# Patient Record
Sex: Male | Born: 1960
Health system: Southern US, Community
[De-identification: ages and names within clinical notes are randomized; demographics above are authoritative.]

## PROBLEM LIST (undated history)

## (undated) DIAGNOSIS — F1910 Other psychoactive substance abuse, uncomplicated: Secondary | ICD-10-CM

## (undated) DIAGNOSIS — G40909 Epilepsy, unspecified, not intractable, without status epilepticus: Secondary | ICD-10-CM

## (undated) DIAGNOSIS — F32A Depression, unspecified: Secondary | ICD-10-CM

## (undated) DIAGNOSIS — IMO0002 Reserved for concepts with insufficient information to code with codable children: Secondary | ICD-10-CM

## (undated) DIAGNOSIS — Z5189 Encounter for other specified aftercare: Secondary | ICD-10-CM

## (undated) DIAGNOSIS — E164 Increased secretion of gastrin: Secondary | ICD-10-CM

## (undated) DIAGNOSIS — R569 Unspecified convulsions: Secondary | ICD-10-CM

## (undated) DIAGNOSIS — Z0289 Encounter for other administrative examinations: Secondary | ICD-10-CM

## (undated) DIAGNOSIS — J449 Chronic obstructive pulmonary disease, unspecified: Secondary | ICD-10-CM

## (undated) DIAGNOSIS — M1990 Unspecified osteoarthritis, unspecified site: Secondary | ICD-10-CM

## (undated) DIAGNOSIS — G8929 Other chronic pain: Secondary | ICD-10-CM

## (undated) DIAGNOSIS — G43909 Migraine, unspecified, not intractable, without status migrainosus: Secondary | ICD-10-CM

## (undated) DIAGNOSIS — K861 Other chronic pancreatitis: Secondary | ICD-10-CM

## (undated) DIAGNOSIS — K219 Gastro-esophageal reflux disease without esophagitis: Secondary | ICD-10-CM

## (undated) DIAGNOSIS — Z87442 Personal history of urinary calculi: Secondary | ICD-10-CM

## (undated) DIAGNOSIS — M109 Gout, unspecified: Secondary | ICD-10-CM

## (undated) DIAGNOSIS — M542 Cervicalgia: Secondary | ICD-10-CM

## (undated) DIAGNOSIS — F329 Major depressive disorder, single episode, unspecified: Secondary | ICD-10-CM

## (undated) DIAGNOSIS — K922 Gastrointestinal hemorrhage, unspecified: Secondary | ICD-10-CM

## (undated) HISTORY — DX: Gastrointestinal hemorrhage, unspecified: K92.2

## (undated) HISTORY — DX: Depression, unspecified: F32.A

## (undated) HISTORY — DX: Increased secretion of gastrin: E16.4

## (undated) HISTORY — DX: Reserved for concepts with insufficient information to code with codable children: IMO0002

## (undated) HISTORY — DX: Major depressive disorder, single episode, unspecified: F32.9

## (undated) HISTORY — PX: PARTIAL GASTRECTOMY: SHX2172

## (undated) HISTORY — DX: Other psychoactive substance abuse, uncomplicated: F19.10

## (undated) HISTORY — DX: Epilepsy, unspecified, not intractable, without status epilepticus: G40.909

## (undated) HISTORY — DX: Encounter for other specified aftercare: Z51.89

## (undated) HISTORY — DX: Other chronic pancreatitis: K86.1

## (undated) HISTORY — DX: Gout, unspecified: M10.9

## (undated) HISTORY — DX: Unspecified convulsions: R56.9

## (undated) HISTORY — PX: EYE SURGERY: SHX253

## (undated) HISTORY — PX: APPENDECTOMY: SHX54

## (undated) HISTORY — DX: Chronic obstructive pulmonary disease, unspecified: J44.9

## (undated) HISTORY — PX: HERNIA REPAIR: SHX51

## (undated) HISTORY — PX: CHOLECYSTECTOMY: SHX55

## (undated) HISTORY — PX: SMALL INTESTINE SURGERY: SHX150

---

## 1999-06-15 ENCOUNTER — Emergency Department (HOSPITAL_COMMUNITY): Admission: EM | Admit: 1999-06-15 | Discharge: 1999-06-15 | Payer: Self-pay | Admitting: Emergency Medicine

## 1999-06-16 ENCOUNTER — Inpatient Hospital Stay (HOSPITAL_COMMUNITY): Admission: AD | Admit: 1999-06-16 | Discharge: 1999-06-17 | Payer: Self-pay | Admitting: Psychiatry

## 1999-06-20 ENCOUNTER — Encounter: Payer: Self-pay | Admitting: Emergency Medicine

## 1999-06-20 ENCOUNTER — Emergency Department (HOSPITAL_COMMUNITY): Admission: EM | Admit: 1999-06-20 | Discharge: 1999-06-21 | Payer: Self-pay | Admitting: Emergency Medicine

## 2001-03-16 ENCOUNTER — Encounter: Payer: Self-pay | Admitting: Emergency Medicine

## 2001-03-17 ENCOUNTER — Inpatient Hospital Stay (HOSPITAL_COMMUNITY): Admission: EM | Admit: 2001-03-17 | Discharge: 2001-03-19 | Payer: Self-pay | Admitting: Emergency Medicine

## 2001-03-25 ENCOUNTER — Encounter: Admission: RE | Admit: 2001-03-25 | Discharge: 2001-03-25 | Payer: Self-pay | Admitting: Family Medicine

## 2001-09-02 ENCOUNTER — Emergency Department (HOSPITAL_COMMUNITY): Admission: EM | Admit: 2001-09-02 | Discharge: 2001-09-03 | Payer: Self-pay | Admitting: *Deleted

## 2001-09-03 ENCOUNTER — Encounter: Payer: Self-pay | Admitting: Emergency Medicine

## 2002-03-13 ENCOUNTER — Emergency Department (HOSPITAL_COMMUNITY): Admission: EM | Admit: 2002-03-13 | Discharge: 2002-03-13 | Payer: Self-pay

## 2004-09-14 ENCOUNTER — Emergency Department (HOSPITAL_COMMUNITY): Admission: EM | Admit: 2004-09-14 | Discharge: 2004-09-14 | Payer: Self-pay | Admitting: Emergency Medicine

## 2007-04-15 ENCOUNTER — Emergency Department (HOSPITAL_COMMUNITY): Admission: EM | Admit: 2007-04-15 | Discharge: 2007-04-15 | Payer: Self-pay | Admitting: Emergency Medicine

## 2009-08-14 ENCOUNTER — Emergency Department (HOSPITAL_BASED_OUTPATIENT_CLINIC_OR_DEPARTMENT_OTHER): Admission: EM | Admit: 2009-08-14 | Discharge: 2009-08-14 | Payer: Self-pay | Admitting: Emergency Medicine

## 2009-08-14 ENCOUNTER — Ambulatory Visit: Payer: Self-pay | Admitting: Diagnostic Radiology

## 2009-08-29 ENCOUNTER — Ambulatory Visit: Payer: Self-pay | Admitting: Diagnostic Radiology

## 2009-08-29 ENCOUNTER — Ambulatory Visit (HOSPITAL_BASED_OUTPATIENT_CLINIC_OR_DEPARTMENT_OTHER): Admission: RE | Admit: 2009-08-29 | Discharge: 2009-08-29 | Payer: Self-pay | Admitting: Emergency Medicine

## 2010-09-21 ENCOUNTER — Encounter: Admission: RE | Admit: 2010-09-21 | Discharge: 2010-09-21 | Payer: Self-pay | Admitting: Emergency Medicine

## 2010-12-22 ENCOUNTER — Encounter: Payer: Self-pay | Admitting: Emergency Medicine

## 2010-12-24 ENCOUNTER — Encounter: Payer: Self-pay | Admitting: Emergency Medicine

## 2011-03-08 LAB — URINE MICROSCOPIC-ADD ON

## 2011-03-08 LAB — URINALYSIS, ROUTINE W REFLEX MICROSCOPIC
Leukocytes, UA: NEGATIVE
Nitrite: NEGATIVE
Specific Gravity, Urine: 1.03 (ref 1.005–1.030)
pH: 6 (ref 5.0–8.0)

## 2011-03-08 LAB — POCT CARDIAC MARKERS
CKMB, poc: <1 ng/mL — ABNORMAL LOW (ref 1.0–8.0)
Myoglobin, poc: 72.7 ng/mL (ref 12–200)
Myoglobin, poc: 79.8 ng/mL (ref 12–200)
Troponin i, poc: <0.05 ng/mL (ref 0.00–0.09)

## 2011-03-08 LAB — BASIC METABOLIC PANEL
BUN: 6 mg/dL (ref 6–23)
CO2: 29 mEq/L (ref 19–32)
Chloride: 101 mEq/L (ref 96–112)
Glucose, Bld: 132 mg/dL — ABNORMAL HIGH (ref 70–99)
Potassium: 2.9 mEq/L — ABNORMAL LOW (ref 3.5–5.1)

## 2011-03-08 LAB — CBC
HCT: 33 % — ABNORMAL LOW (ref 39.0–52.0)
MCV: 93.2 fL (ref 78.0–100.0)
Platelets: 172 10*3/uL (ref 150–400)
RDW: 12.2 % (ref 11.5–15.5)

## 2011-03-08 LAB — DIFFERENTIAL
Basophils Absolute: 0.1 10*3/uL (ref 0.0–0.1)
Eosinophils Relative: 2 % (ref 0–5)
Lymphocytes Relative: 19 % (ref 12–46)
Monocytes Absolute: 1.6 10*3/uL — ABNORMAL HIGH (ref 0.1–1.0)

## 2011-03-08 LAB — APTT: aPTT: 31 seconds (ref 24–37)

## 2011-04-19 NOTE — Discharge Summary (Signed)
[place]  Patient:    Jerry Knight, Jerry Knight                       MRN: [id number] Adm. Date:  [id number] Disc. Date: [id number] Attending:  Willow Ora Dictator:   Solon Palm, M.D. CC:         Tom _______, Rochel Brome. [place]  Dr. Jennye Moccasin, [place], [place], GI physician   Discharge Summary  PROBLEM LIST: 1. Acute pancreatitis. 2. Chronic pancreatitis. 3. Migraine headaches. 4. Peptic ulcer disease. 5. Seizure disorder. 6. Degenerative disc disease. 7. History of gastrointestinal bleed.  BRIEF HISTORY OF PRESENT ILLNESS:  The patient is a 50 year old white male who presented on March 17, 2001, with one week of increasing mid epigastric pain consistent with flare of his pancreatitis.  Please see dictated H&P for details.  Though the patient had normal amylase and lipase, he states he has a long history of recurrent pancreatitis normally treated at the [place] in [place].  Initial physical examination very consistent with pancreatitis flare.  The patient was kept npo for the first 36 hours, slowly advanced diet. The patient tolerated advanced diet and was treated with IV Demerol and Phenergan.  Of note, he did show reddening in his skin on administration of IV Phenergan. That was discontinued and changed to p.o. once the patient did tolerate p.o. and the patient tolerated the IV Demerol without complications. In the afternoon of March 19, 2001, the patient showed adequate pain control on Percocet which he states to control his pain. The patient tolerating diet well.  Felt he will be able to keep up adequately with his p.o. hydration.  DISCHARGE INSTRUCTIONS: 1. Medications:    a. Percocet 5/325, the patient to take two tablets p.o. q.4-6h. as needed.    b. Topamax one tablet p.o. q.d. 2. Activity:  As tolerated. 3. Diet:  Continue soft diet.  Hydration encouraged. 4. Special instructions:    a. Follow-up with your primary  gastroenterologist at the [facility] as soon       as able.    b. Follow-up platelet was scheduled for patient. He states he can keep       his own appointment. DD:  03/19/01 TD:  03/20/01 Job: [id number]

## 2011-04-19 NOTE — H&P (Signed)
[place]  Patient:    Jerry Knight, Jerry Knight                       MRN: [id number] Adm. Date:  [id number] Disc. Date: [id number] Attending:  Willow Ora Dictator:   Solon Palm, M.D. CC:         Alferd Patee, P.A., [facility], [place], [place]   History and Physical  CHIEF COMPLAINT:  Abdominal pain.  HISTORY OF PRESENT ILLNESS:  The patient is a 50 year old white male with history of chronic pancreatitis, who presents with one week of progressively increasing mid epigastric pain that radiated to back, relieved by sitting in upright position, also noted having increasing diarrhea.  No bleeding, no melena, no vomiting.  He feels weak, dehydrated and tired of pain.  The patient is in town visiting father-in-law, who is ill, and not able to go to primary physician in [place].  PAST MEDICAL HISTORY: 1. Chronic pancreatitis. 2. C5-C6 degenerative disk disease. 3. Migraine headaches. 4. Seizure disorder x 7 years. 5. Zollinger-Ellison disease. 6. GI bleed.  PAST SURGICAL HISTORY:  Multiple abdominal GI resections seeming secondary to GI bleeds in 1983, 1985, 1992, 1993.  In 2000, the patient had a left parotid gland removed.  MEDICATIONS: 1. Topamax ______ p.o. q.d. 2. Percocet 2-3 tabs t.i.d. for 10 years.  ALLERGIES:  No known drug allergies.  Sensitive to MORPHINE.  FAMILY HISTORY:  Mother is alive at 49 years old with angina.  Father is alive at 60 with kidney stones.  Brothers and sisters are in good health.  SOCIAL HISTORY:  The patient is retired from the [employer] and married for 15 years, currently separated from wife but in very good standing.  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  He does smoke two packs a day for 20 years.  REVIEW OF SYSTEMS:  Positive weakness, positive pain.  ABDOMEN:  Decreased appetite x 3 days.  No fever, no chills, no  chest pain, no shortness of breath, no cough, no dysuria, no frequency.  Positive "dumping syndrome" for a week.  Positive headache.  No vision changes.  No seizure activity.  PHYSICAL EXAMINATION:  VITAL SIGNS:  97, 106, 16, 98%, 99/67.  GENERAL:  Thin, wrinkled white man, appearing older than older than his age, presently smiling.  HEENT:  Normocephalic, atraumatic.  Sclerae nonicteric.  PERRLA.  EOMI.  Nares patent, edentulous.  Mucous membranes are dry.  NECK:  Healed scars on left neck.  CARDIAC:  No JVD, no bruits, PMI nondisplaced, tachycardia, regular, no murmurs.  PULMONARY:  ______ expiratory phase.  Good effort.  Lungs field clear to auscultation bilaterally.  ABDOMEN:  Soft.  Positive bowel sounds.  Healed surgical scars in midline. Thin.  No rebound, guarding, or rigidity.  Positive very tender in mid epigastrium area to palpation.  EXTREMITIES:  No clubbing, cyanosis, or edema.  LABORATORY DATA:  Specific gravity 1.034, amylase 67, lipase 25.  WBC 16.6, hemoglobin 14.8, hematocrit 43.3, platelets 263.  RDW 12.6.  Chest x-ray no acute disease.  Abdominal x-ray no acute disease.  Sodium 134, potassium 3.9, chloride 103, CO2 23, BUN 17, creatinine .7, glucose 115, calcium 9.7, AST 16, ALT 12, alkaline phosphatase 91, bilirubin .9.  ASSESSMENT AND PLAN:  A 50 year old white male with increasing abdominal pain. Abdominal pain.  Surgical diagnoses includes pancreatitis, pseudocyst, obstruction, Zollinger-ellison disease, will treat  as pancreatitis for now, as the patient has a long history.  Chart review will be key.  Patient essentially has an APACHE score of 0.  Will hold ______ .  No vomiting or NG tube at this time.  Will keep patient n.p.o. and IV hydrate.  Will treat with Demerol IV.  Will discuss with the patient and our attending whether to pursue ongoing diagnosis with scans and surgery referrals or whether to treat this acute flare symptomatically so  patient may follow in [place] with his primary, as he seems to prefer. DD:  03/19/01 TD:  03/20/01 Job: [id number]

## 2011-06-21 ENCOUNTER — Ambulatory Visit
Admission: RE | Admit: 2011-06-21 | Discharge: 2011-06-21 | Disposition: A | Discharge status: Home / Self Care | Payer: Medicare Other | Source: Ambulatory Visit | Attending: Emergency Medicine | Admitting: Emergency Medicine

## 2011-06-21 ENCOUNTER — Other Ambulatory Visit: Payer: Self-pay | Admitting: Emergency Medicine

## 2011-06-21 MED ORDER — IOHEXOL 300 MG/ML  SOLN
100.0000 mL | Freq: Once | INTRAMUSCULAR | Status: AC | PRN
  Administered 2011-06-21: 100 mL via INTRAVENOUS

## 2011-06-25 ENCOUNTER — Other Ambulatory Visit: Payer: Self-pay | Admitting: Emergency Medicine

## 2011-06-25 DIAGNOSIS — R918 Other nonspecific abnormal finding of lung field: Secondary | ICD-10-CM

## 2011-07-01 ENCOUNTER — Other Ambulatory Visit: Payer: Medicare Other

## 2011-07-24 ENCOUNTER — Other Ambulatory Visit (HOSPITAL_COMMUNITY): Payer: Self-pay | Admitting: Urology

## 2011-07-24 DIAGNOSIS — N281 Cyst of kidney, acquired: Secondary | ICD-10-CM

## 2011-07-29 ENCOUNTER — Ambulatory Visit (HOSPITAL_COMMUNITY)
Admission: RE | Admit: 2011-07-29 | Discharge: 2011-07-29 | Disposition: A | Discharge status: Home / Self Care | Payer: Medicare Other | Source: Ambulatory Visit | Attending: Urology | Admitting: Urology

## 2011-07-29 ENCOUNTER — Other Ambulatory Visit: Payer: Self-pay | Admitting: Interventional Radiology

## 2011-07-29 DIAGNOSIS — N281 Cyst of kidney, acquired: Secondary | ICD-10-CM

## 2011-07-29 DIAGNOSIS — K859 Acute pancreatitis without necrosis or infection, unspecified: Secondary | ICD-10-CM | POA: Insufficient documentation

## 2011-07-29 DIAGNOSIS — K863 Pseudocyst of pancreas: Secondary | ICD-10-CM | POA: Insufficient documentation

## 2011-07-29 DIAGNOSIS — Z01812 Encounter for preprocedural laboratory examination: Secondary | ICD-10-CM | POA: Insufficient documentation

## 2011-07-29 DIAGNOSIS — K862 Cyst of pancreas: Secondary | ICD-10-CM | POA: Insufficient documentation

## 2011-07-29 LAB — BASIC METABOLIC PANEL
BUN: 12 mg/dL (ref 6–23)
CO2: 33 mEq/L — ABNORMAL HIGH (ref 19–32)
Calcium: 10.1 mg/dL (ref 8.4–10.5)
Chloride: 96 mEq/L (ref 96–112)
Creatinine, Ser: 0.62 mg/dL (ref 0.50–1.35)
GFR calc Af Amer: >60 mL/min (ref >60)
GFR calc non Af Amer: >60 mL/min (ref >60)
Glucose, Bld: 89 mg/dL (ref 70–99)
Potassium: 4.4 mEq/L (ref 3.5–5.1)
Sodium: 135 mEq/L (ref 135–145)

## 2011-07-29 LAB — CBC
HCT: 40 % (ref 39.0–52.0)
Hemoglobin: 13.8 g/dL (ref 13.0–17.0)
MCH: 31.2 pg (ref 26.0–34.0)
MCHC: 34.5 g/dL (ref 30.0–36.0)
MCV: 90.3 fL (ref 78.0–100.0)
Platelets: 390 10*3/uL (ref 150–400)
RBC: 4.43 MIL/uL (ref 4.22–5.81)
RDW: 13.8 % (ref 11.5–15.5)
WBC: 9.3 10*3/uL (ref 4.0–10.5)

## 2011-07-29 LAB — PROTIME-INR
INR: 0.92 (ref 0.00–1.49)
Prothrombin Time: 12.6 seconds (ref 11.6–15.2)

## 2011-07-29 LAB — APTT: aPTT: 29 seconds (ref 24–37)

## 2011-07-31 ENCOUNTER — Telehealth: Payer: Self-pay | Admitting: Internal Medicine

## 2011-07-31 NOTE — Telephone Encounter (Signed)
Left message for pt to call back  °

## 2011-08-01 NOTE — Telephone Encounter (Signed)
Left message for pt to call back  °

## 2011-08-06 NOTE — Telephone Encounter (Signed)
Left message for pt to call back.   After multiple attempts of trying to get in touch with the pt. I have been unable to do so. Pt will not return the phone messages left for him. Called [facility] and let them know we have been unable to schedule the pt.

## 2011-08-30 ENCOUNTER — Ambulatory Visit (HOSPITAL_BASED_OUTPATIENT_CLINIC_OR_DEPARTMENT_OTHER)
Admission: RE | Admit: 2011-08-30 | Discharge: 2011-08-30 | Disposition: A | Discharge status: Home / Self Care | Payer: Medicare Other | Source: Ambulatory Visit | Attending: Orthopedic Surgery | Admitting: Orthopedic Surgery

## 2011-08-30 DIAGNOSIS — M869 Osteomyelitis, unspecified: Secondary | ICD-10-CM | POA: Insufficient documentation

## 2011-08-30 DIAGNOSIS — J449 Chronic obstructive pulmonary disease, unspecified: Secondary | ICD-10-CM | POA: Insufficient documentation

## 2011-08-30 DIAGNOSIS — IMO0002 Reserved for concepts with insufficient information to code with codable children: Secondary | ICD-10-CM | POA: Insufficient documentation

## 2011-08-30 DIAGNOSIS — J4489 Other specified chronic obstructive pulmonary disease: Secondary | ICD-10-CM | POA: Insufficient documentation

## 2011-09-02 LAB — WOUND CULTURE

## 2011-09-03 LAB — TISSUE CULTURE

## 2011-09-03 NOTE — Op Note (Signed)
  NAME:  Jerry Knight, Jerry Knight                ACCOUNT NO.:  [id number]  MEDICAL RECORD NO.:  [id number]  LOCATION:                                 FACILITY:  PHYSICIAN:  Cindee Salt, M.D.       DATE OF BIRTH:  1961/09/10  DATE OF PROCEDURE:  08/30/2011 DATE OF DISCHARGE:                              OPERATIVE REPORT   PREOPERATIVE DIAGNOSIS:  Osteomyelitis felon, left thumb.  POSTOPERATIVE DIAGNOSIS:  Osteomyelitis felon, left thumb.  OPERATION:  Incision and drainage, osteomyelitis felon, left thumb, distal phalanx.  SURGEON:  Cindee Salt, MD  ANESTHESIA:  General.  ANESTHESIOLOGIST:  Bedelia Person, MD  HISTORY:  The patient is a 50 year old male with a history of swelling pain of his thumb.  He was recently placed on Cipro, attempted to drain this himself without success.  X-rays reveal loculation of the distal phalanx indicative of an osteomyelitis.  He is admitted now for incision and drainage.  Pre, peri, and postoperative course have been discussed along with risks and complications.  PROCEDURE:  The patient was brought to the operating room where a general anesthetic was carried out without difficulty.  He was prepped using ChloraPrep, supine position, left arm free.  A 3-minute dry time was allowed.  Time-out taken confirming the patient and procedure. After adequate anesthesia was afforded, the limb was elevated for exsanguination.  Tourniquet was placed on the forearm, it was inflated to 250 mmHg.  The nail plate was removed.  No gross purulence was noted beneath the nail plate and incision was made on the ulnar aspect of the distal phalanx, carried down through the subcutaneous tissue.  All material was immediately encountered.  This was opened and followed down to the distal phalanx, which had a large erosion, this was curetted clear.  Cultures were taken for both aerobic and anaerobic fungal and TB cultures along with MRSA.  Specimen was sent along with a biopsy from the  bone.  The wound was then copiously irrigated with saline.  The wound was packed open.  A sterile compressive dressing splint to the thumb was applied.  The patient tolerated the procedure well and was taken to the recovery room for observation in satisfactory condition.  He will be discharged to home to return on Monday or Tuesday, on Percocet and Cipro.          ______________________________ Cindee Salt, M.D.     GK/MEDQ  D:  08/30/2011  T:  08/30/2011  Job:  [id number]  cc:   Brett Canales A. Cleta Alberts, M.D.  Electronically Signed by Cindee Salt M.D. on 09/03/2011 04:41:00 PM

## 2011-09-04 LAB — ANAEROBIC CULTURE

## 2011-09-27 LAB — FUNGUS CULTURE W SMEAR
Fungal Smear: NONE SEEN
Fungal Smear: NONE SEEN

## 2011-10-01 ENCOUNTER — Encounter: Payer: Self-pay | Admitting: Gastroenterology

## 2011-10-14 LAB — AFB CULTURE WITH SMEAR (NOT AT ARMC)

## 2011-12-02 ENCOUNTER — Ambulatory Visit (INDEPENDENT_AMBULATORY_CARE_PROVIDER_SITE_OTHER): Payer: Medicare Other

## 2011-12-02 DIAGNOSIS — F411 Generalized anxiety disorder: Secondary | ICD-10-CM

## 2011-12-02 DIAGNOSIS — R071 Chest pain on breathing: Secondary | ICD-10-CM

## 2011-12-28 ENCOUNTER — Ambulatory Visit (INDEPENDENT_AMBULATORY_CARE_PROVIDER_SITE_OTHER): Payer: Medicare Other

## 2011-12-28 DIAGNOSIS — M545 Low back pain: Secondary | ICD-10-CM

## 2011-12-28 DIAGNOSIS — K863 Pseudocyst of pancreas: Secondary | ICD-10-CM

## 2012-01-10 ENCOUNTER — Ambulatory Visit: Payer: Medicare Other | Admitting: Internal Medicine

## 2012-01-23 ENCOUNTER — Encounter: Payer: Self-pay | Admitting: Internal Medicine

## 2012-01-23 ENCOUNTER — Telehealth: Payer: Self-pay

## 2012-01-23 ENCOUNTER — Ambulatory Visit (INDEPENDENT_AMBULATORY_CARE_PROVIDER_SITE_OTHER): Payer: Medicare Other | Admitting: Internal Medicine

## 2012-01-23 DIAGNOSIS — R0602 Shortness of breath: Secondary | ICD-10-CM

## 2012-01-23 DIAGNOSIS — R079 Chest pain, unspecified: Secondary | ICD-10-CM

## 2012-01-23 NOTE — Progress Notes (Signed)
HPI Patient has been under increased stress.  Brother died  Has seen Dr. Cleta Alberts in past  Felt to have panic attacks.  Multiple medical problesm (MRSA thumb, panreatitis, renal cyst)  Patient said he was getting sore in left chest.  Real SOB.  Would get sore with and without activity.  Begain about 4 to 5 months ago.  Occur at least 1x per wk.  Now seems to be a bit more frequent. Still with and without activity.  Pain and SOB may last as long as 30 min.  May be exacerbated by not feeling well. No PND. Saw S/ Daub 1 month ago.  Told him that he took 1 Xanax and it seemed to help.  Allergies  Allergen Reactions  . Sonata (Zaleplon)     Current Outpatient Prescriptions  Medication Sig Dispense Refill  . amitriptyline (ELAVIL) 50 MG tablet Take 100 mg by mouth at bedtime.      Marland Kitchen morphine (MS CONTIN) 30 MG 12 hr tablet Take 30 mg by mouth 2 (two) times daily.      Marland Kitchen oxyCODONE-acetaminophen (PERCOCET) 5-325 MG per tablet Take 1 tablet by mouth every 4 (four) hours as needed.        Past Medical History  Diagnosis Date  . Chest pain   . Chronic pancreatitis   . 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 Migraine   . Seizure disorder     x 7 years  . Zollinger-Ellison syndrome   . GI bleed     History reviewed. No pertinent past surgical history.  Family History  Problem Relation Age of Onset  . Angina Mother   . Nephrolithiasis Father       Review of Systems:  All systems reviewed.  They are negative to the above problem except as previously stated.  Vital Signs: BP 125/87  Pulse 105  Ht 5\' 5"  (1.651 m)  Wt 108 lb (48.988 kg)  BMI 17.97 kg/m2  Physical Exam Patient a thin 51 year old in NAD HEENT:  Normocephalic, atraumatic. EOMI, PERRLA.  Neck: JVP is normal. No thyromegaly. No bruits.  Lungs: clear to auscultation. No rales no wheezes.  Heart: Regular rate and rhythm. Normal S1, S2. No S3.   No significant murmurs. PMI not displaced.  Abdomen:  Supple, nontender.  Normal bowel sounds. No masses. No hepatomegaly.  Extremities:   Good distal pulses throughout. No lower extremity edema.  Musculoskeletal :moving all extremities.  Neuro:   alert and oriented x3.  CN II-XII grossly intact.  EKG:  ST  105 bpm.   Assessment and Plan:

## 2012-01-23 NOTE — Telephone Encounter (Signed)
Records sent to [facility].

## 2012-01-23 NOTE — Telephone Encounter (Signed)
.  umfc Dr. Tenny Craw at [facility] is requesting labs/ekg/progress note for patient who is in office now. Fax number : [phone]

## 2012-01-26 DIAGNOSIS — R079 Chest pain, unspecified: Secondary | ICD-10-CM | POA: Insufficient documentation

## 2012-01-26 DIAGNOSIS — R0602 Shortness of breath: Secondary | ICD-10-CM | POA: Insufficient documentation

## 2012-01-26 NOTE — Assessment & Plan Note (Signed)
Would like to review records.

## 2012-01-26 NOTE — Assessment & Plan Note (Signed)
Symptoms concerning but atypical.  I did not receive records from Dr. Ellis Parents office. I would like to review before scheduling for any tests.   Patient should continue acitvities as tolerated.

## 2012-01-29 ENCOUNTER — Telehealth: Payer: Self-pay | Admitting: Internal Medicine

## 2012-01-29 NOTE — Telephone Encounter (Signed)
Reviewed records from [facility]. Set up for regular stress test.

## 2012-01-29 NOTE — Telephone Encounter (Signed)
LMOM for call back to see if he would be be willing to set up a GXT per Dr.Ross' request.

## 2012-01-30 ENCOUNTER — Ambulatory Visit (INDEPENDENT_AMBULATORY_CARE_PROVIDER_SITE_OTHER): Payer: Medicare Other | Admitting: Emergency Medicine

## 2012-01-30 ENCOUNTER — Other Ambulatory Visit: Payer: Self-pay | Admitting: Emergency Medicine

## 2012-01-30 DIAGNOSIS — K859 Acute pancreatitis without necrosis or infection, unspecified: Secondary | ICD-10-CM

## 2012-01-30 DIAGNOSIS — M549 Dorsalgia, unspecified: Secondary | ICD-10-CM

## 2012-01-30 MED ORDER — MORPHINE SULFATE CR 30 MG PO TB12: 30.0000 mg | ORAL_TABLET | Freq: Two times a day (BID) | ORAL | Status: DC

## 2012-01-30 MED ORDER — OXYCODONE-ACETAMINOPHEN 10-325 MG PO TABS: 1.0000 | ORAL_TABLET | Freq: Four times a day (QID) | ORAL | Status: DC | PRN

## 2012-01-30 NOTE — Progress Notes (Signed)
  Subjective:    Patient ID: Jerry Knight, male    DOB: 1960-12-30, 51 y.o.   MRN: [id number]  HPI patient enters for refill of his prescriptions. He states he is unable to weight. He would like a prescription for one week. Until he can get in to see me.    Review of Systems     Objective:   Physical Exam no change in exam.        Assessment & Plan:  The patient is on chronic pain medications related to her chronic pancreatitis. He is been on medications for a number of years. I agreed to refill his medications for one week. I told him none of the other providers could refill his medications.

## 2012-02-02 ENCOUNTER — Encounter: Payer: Medicare Other | Admitting: Physician Assistant

## 2012-02-02 NOTE — Progress Notes (Signed)
This encounter was created in error - please disregard.

## 2012-02-05 ENCOUNTER — Ambulatory Visit (INDEPENDENT_AMBULATORY_CARE_PROVIDER_SITE_OTHER): Payer: Medicare Other | Admitting: Emergency Medicine

## 2012-02-05 DIAGNOSIS — K859 Acute pancreatitis without necrosis or infection, unspecified: Secondary | ICD-10-CM

## 2012-02-05 DIAGNOSIS — R634 Abnormal weight loss: Secondary | ICD-10-CM

## 2012-02-05 DIAGNOSIS — M549 Dorsalgia, unspecified: Secondary | ICD-10-CM

## 2012-02-05 DIAGNOSIS — R1084 Generalized abdominal pain: Secondary | ICD-10-CM

## 2012-02-05 MED ORDER — MORPHINE SULFATE CR 30 MG PO TB12: 30.0000 mg | ORAL_TABLET | Freq: Two times a day (BID) | ORAL | Status: DC

## 2012-02-05 MED ORDER — OXYCODONE-ACETAMINOPHEN 10-325 MG PO TABS: 1.0000 | ORAL_TABLET | Freq: Four times a day (QID) | ORAL | Status: AC | PRN

## 2012-02-05 NOTE — Progress Notes (Signed)
  Subjective:    Patient ID: Jerry Knight, male    DOB: 1961/02/22, 51 y.o.   MRN: [id number]  HPI patient enters for refill of his pain medications. He is scheduled for sinus surgery April 1. He continues to try and increase his by mouth intake. He states he is taking his pancreatic enzymes.    Review of Systems he denies any other symptoms except he is scheduled for sinus surgery.     Objective:   Physical Exam does have nasal congestion. Nose has crusted inflamed chest is clear heart regular rate no murmurs his abdomen is flat there is no tenderness.        Assessment & Plan:  Assessment is chronic abdominal pain related to chronic pancreatitis. He continues to battle with weight. He is scheduled for sinus surgery for his sinusitis. Patient was given one-month prescriptions for his medications. He will return in one month for recheck.

## 2012-02-24 ENCOUNTER — Other Ambulatory Visit: Payer: Self-pay | Admitting: Family Medicine

## 2012-02-27 NOTE — Telephone Encounter (Signed)
LMOM to call back to set up GXT.

## 2012-03-04 ENCOUNTER — Ambulatory Visit (INDEPENDENT_AMBULATORY_CARE_PROVIDER_SITE_OTHER): Payer: Medicare Other | Admitting: Emergency Medicine

## 2012-03-04 VITALS — BP 97/59 | HR 101 | Temp 97.7°F | Resp 16 | Ht 66.0 in | Wt 108.6 lb

## 2012-03-04 DIAGNOSIS — J019 Acute sinusitis, unspecified: Secondary | ICD-10-CM

## 2012-03-04 DIAGNOSIS — M549 Dorsalgia, unspecified: Secondary | ICD-10-CM

## 2012-03-04 DIAGNOSIS — K859 Acute pancreatitis without necrosis or infection, unspecified: Secondary | ICD-10-CM

## 2012-03-04 DIAGNOSIS — K861 Other chronic pancreatitis: Secondary | ICD-10-CM

## 2012-03-04 MED ORDER — OXYCODONE-ACETAMINOPHEN 10-325 MG PO TABS: 1.0000 | ORAL_TABLET | Freq: Four times a day (QID) | ORAL | Status: DC | PRN

## 2012-03-04 MED ORDER — MORPHINE SULFATE CR 30 MG PO TB12: 30.0000 mg | ORAL_TABLET | Freq: Two times a day (BID) | ORAL | Status: DC

## 2012-03-04 NOTE — Progress Notes (Signed)
  Subjective:    Patient ID: Jerry Knight, male    DOB: Apr 13, 1961, 51 y.o.   MRN: [id number]  HPI patient here for followup of pain management. His chronic pancreatitis. He is on chronic pain medication and has been for years. He is scheduled for sinus surgery tomorrow with Dr. Gray Bernhardt at [facility].    Review of Systems     Objective:   Physical Exam patient is alert cooperative in no distress. His chest is clear heart regular rate. He as no tenderness in the midepigastrium        Assessment & Plan:  ere for refill of his medications. Refills were given for one month. He was told he is not allowed to get any prescriptions for pain from Dr. Baldomero Lamy.

## 2012-03-05 NOTE — Telephone Encounter (Signed)
Left message to call back to schedule GXT.

## 2012-03-06 ENCOUNTER — Telehealth: Payer: Self-pay

## 2012-03-06 NOTE — Telephone Encounter (Signed)
.  umfc The patient called to inform Dr. Cleta Alberts that the MD who did his sinus surgery on 03/05/12 wrote him a Rx for Percocet to take in addition to the Percocet and Morphine that the patient already takes.  Patient did not want to fill Rx and get in trouble or violate his pain management contract.  Patient questions should he fill Rx or not? Please call patient at [phone].

## 2012-03-07 NOTE — Telephone Encounter (Signed)
LMOM advising patient not to fill new rx.  If he does he would violate his contract.  CB if further questions, and note to MD, FYI.

## 2012-03-18 ENCOUNTER — Emergency Department (HOSPITAL_COMMUNITY): Payer: Medicare Other

## 2012-03-18 ENCOUNTER — Emergency Department (HOSPITAL_COMMUNITY)
Admission: EM | Admit: 2012-03-18 | Discharge: 2012-03-18 | Disposition: A | Discharge status: Home / Self Care | Payer: Medicare Other | Attending: Emergency Medicine | Admitting: Emergency Medicine

## 2012-03-18 ENCOUNTER — Encounter (HOSPITAL_COMMUNITY): Payer: Self-pay | Admitting: Emergency Medicine

## 2012-03-18 DIAGNOSIS — K861 Other chronic pancreatitis: Secondary | ICD-10-CM | POA: Insufficient documentation

## 2012-03-18 DIAGNOSIS — R197 Diarrhea, unspecified: Secondary | ICD-10-CM | POA: Insufficient documentation

## 2012-03-18 DIAGNOSIS — R112 Nausea with vomiting, unspecified: Secondary | ICD-10-CM

## 2012-03-18 DIAGNOSIS — R109 Unspecified abdominal pain: Secondary | ICD-10-CM | POA: Insufficient documentation

## 2012-03-18 DIAGNOSIS — M5030 Other cervical disc degeneration, unspecified cervical region: Secondary | ICD-10-CM | POA: Insufficient documentation

## 2012-03-18 HISTORY — DX: Other chronic pain: G89.29

## 2012-03-18 HISTORY — DX: Encounter for other administrative examinations: Z02.89

## 2012-03-18 HISTORY — DX: Cervicalgia: M54.2

## 2012-03-18 HISTORY — DX: Migraine, unspecified, not intractable, without status migrainosus: G43.909

## 2012-03-18 LAB — COMPREHENSIVE METABOLIC PANEL
Albumin: 3.6 g/dL (ref 3.5–5.2)
BUN: 9 mg/dL (ref 6–23)
Calcium: 9.6 mg/dL (ref 8.4–10.5)
Creatinine, Ser: 0.58 mg/dL (ref 0.50–1.35)
Total Bilirubin: 0.3 mg/dL (ref 0.3–1.2)
Total Protein: 7.8 g/dL (ref 6.0–8.3)

## 2012-03-18 LAB — CBC
HCT: 38 % — ABNORMAL LOW (ref 39.0–52.0)
Hemoglobin: 12.9 g/dL — ABNORMAL LOW (ref 13.0–17.0)
MCH: 29.6 pg (ref 26.0–34.0)
MCHC: 33.9 g/dL (ref 30.0–36.0)
RDW: 13.7 % (ref 11.5–15.5)

## 2012-03-18 LAB — DIFFERENTIAL
Basophils Absolute: 0 10*3/uL (ref 0.0–0.1)
Basophils Relative: 0 % (ref 0–1)
Eosinophils Absolute: 0 10*3/uL (ref 0.0–0.7)
Monocytes Absolute: 0.7 10*3/uL (ref 0.1–1.0)
Monocytes Relative: 7 % (ref 3–12)

## 2012-03-18 LAB — LIPASE, BLOOD: Lipase: 48 U/L (ref 11–59)

## 2012-03-18 MED ORDER — SODIUM CHLORIDE 0.9 % IV BOLUS (SEPSIS)
500.0000 mL | Freq: Once | INTRAVENOUS | Status: AC
  Administered 2012-03-18: 500 mL via INTRAVENOUS

## 2012-03-18 MED ORDER — ONDANSETRON HCL 4 MG/2ML IJ SOLN
4.0000 mg | INTRAMUSCULAR | Status: DC | PRN
  Administered 2012-03-18: 4 mg via INTRAVENOUS
  Filled 2012-03-18: qty 2

## 2012-03-18 MED ORDER — HYDROMORPHONE HCL PF 1 MG/ML IJ SOLN
1.0000 mg | Freq: Once | INTRAMUSCULAR | Status: AC
  Administered 2012-03-18: 1 mg via INTRAVENOUS
  Filled 2012-03-18: qty 1

## 2012-03-18 MED ORDER — HYDROMORPHONE HCL PF 1 MG/ML IJ SOLN
1.0000 mg | INTRAMUSCULAR | Status: AC | PRN
  Administered 2012-03-18 (×2): 1 mg via INTRAVENOUS
  Filled 2012-03-18 (×2): qty 1

## 2012-03-18 MED ORDER — SODIUM CHLORIDE 0.9 % IV SOLN
INTRAVENOUS | Status: DC
  Administered 2012-03-18: 13:00:00 via INTRAVENOUS

## 2012-03-18 MED ORDER — ONDANSETRON 4 MG PO TBDP: 4.0000 mg | ORAL_TABLET | Freq: Three times a day (TID) | ORAL | Status: AC | PRN

## 2012-03-18 NOTE — ED Provider Notes (Signed)
History     CSN: [id number]  Arrival date & time 03/18/12  1201   First MD Initiated Contact with Patient 03/18/12 1214      Chief Complaint  Patient presents with  . Nausea  . Abdominal Pain    HPI Pt was seen at 1255.   Per pt, c/o gradual onset and persistence of multiple intermittent episodes of N/V/D since last night.  Pt describes his stool as "watery yellow."  Endorses he has not been taking his chronic pain meds due to N/V and is having an acute flair of his chronic "pains."   Pt also endorses that he "got mad and threw out some of my pain pills" several days ago.  Denies CP/SOB, no fevers, no black or blood in stools or emesis, no back pain, no abd pain.     Past Medical History  Diagnosis Date  . Chest pain   . Chronic pancreatitis   . 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 Migraine   . Seizure disorder     x 7 years  . Zollinger-Ellison syndrome   . GI bleed   . Chronic pain   . Migraine headache   . Chronic neck pain   . Pain management contract agreement     Past Surgical History  Procedure Date  . Partial gastrectomy     Family History  Problem Relation Age of Onset  . Angina Mother   . Nephrolithiasis Father     History  Substance Use Topics  . Smoking status: Current Everyday Smoker  . Smokeless tobacco: Not on file   Comment: about 2 packs a day  . Alcohol Use: No    Review of Systems ROS: Statement: All systems negative except as marked or noted in the HPI; Constitutional: Negative for fever and chills. ; ; Eyes: Negative for eye pain, redness and discharge. ; ; ENMT: Negative for ear pain, hoarseness, nasal congestion, sinus pressure and sore throat. ; ; Cardiovascular: Negative for chest pain, palpitations, diaphoresis, dyspnea and peripheral edema. ; ; Respiratory: Negative for cough, wheezing and stridor. ; ; Gastrointestinal: +N/V/D. Negative for abdominal pain, blood in stool, hematemesis, jaundice and rectal bleeding. . ; ;  Genitourinary: Negative for dysuria, flank pain and hematuria. ; ; Musculoskeletal: Negative for back pain and neck pain. Negative for swelling and trauma.; ; Skin: Negative for pruritus, rash, abrasions, blisters, bruising and skin lesion.; ; Neuro: Negative for headache, lightheadedness and neck stiffness. Negative for weakness, altered level of consciousness , altered mental status, extremity weakness, paresthesias, involuntary movement, seizure and syncope.      Allergies  Sonata  Home Medications   Current Outpatient Rx  Name Route Sig Dispense Refill  . AMITRIPTYLINE HCL 100 MG PO TABS  TAKE  ONE TABLET BY MOUTH NIGHTLY AT BEDTIME 30 tablet 2  . AMITRIPTYLINE HCL 50 MG PO TABS Oral Take 100 mg by mouth at bedtime.    . MORPHINE SULFATE ER 30 MG PO TB12 Oral Take 1 tablet (30 mg total) by mouth 2 (two) times daily. 60 tablet 0  . OXYCODONE-ACETAMINOPHEN 10-325 MG PO TABS Oral Take 1 tablet by mouth every 6 (six) hours as needed for pain. 120 tablet 0  . PRAVASTATIN SODIUM 40 MG PO TABS Oral Take 40 mg by mouth daily.      BP 115/54  Pulse 72  Temp 98.4 F (36.9 C)  Resp 22  SpO2 100%  Physical Exam 1250: Physical examination:  Nursing notes reviewed; Vital signs and O2 SAT reviewed;  Constitutional: Thin, discheveled, In no acute distress; Head:  Normocephalic, atraumatic; Eyes: EOMI, PERRL, No scleral icterus; ENMT: Mouth and pharynx normal, Mucous membranes dry; Neck: Supple, Full range of motion, No lymphadenopathy; Cardiovascular: Regular rate and rhythm, No murmur, rub, or gallop; Respiratory: Breath sounds clear & equal bilaterally, No rales, rhonchi, wheezes, or rub, Normal respiratory effort/excursion; Chest: Nontender, Movement normal; Abdomen: Soft, Nontender, Nondistended, Normal bowel sounds, +vomiting during exam.; Genitourinary: No CVA tenderness; Extremities: Pulses normal, No tenderness, No edema, No calf edema or asymmetry.; Neuro: AA&Ox3, Major CN grossly intact.  No  gross focal motor or sensory deficits in extremities.; Skin: Color normal, Warm, Dry, no rash.    ED Course  Procedures   MDM  MDM Reviewed: nursing note, vitals and previous chart Interpretation: labs and x-ray   Results for orders placed during the hospital encounter of 03/18/12  CBC      Component Value Range   WBC 9.1  4.0 - 10.5 (K/uL)   RBC 4.36  4.22 - 5.81 (MIL/uL)   Hemoglobin 12.9 (*) 13.0 - 17.0 (g/dL)   HCT 16.1 (*) 09.6 - 52.0 (%)   MCV 87.2  78.0 - 100.0 (fL)   MCH 29.6  26.0 - 34.0 (pg)   MCHC 33.9  30.0 - 36.0 (g/dL)   RDW 04.5  40.9 - 81.1 (%)   Platelets 486 (*) 150 - 400 (K/uL)  DIFFERENTIAL      Component Value Range   Neutrophils Relative 77  43 - 77 (%)   Neutro Abs 7.0  1.7 - 7.7 (K/uL)   Lymphocytes Relative 15  12 - 46 (%)   Lymphs Abs 1.4  0.7 - 4.0 (K/uL)   Monocytes Relative 7  3 - 12 (%)   Monocytes Absolute 0.7  0.1 - 1.0 (K/uL)   Eosinophils Relative 0  0 - 5 (%)   Eosinophils Absolute 0.0  0.0 - 0.7 (K/uL)   Basophils Relative 0  0 - 1 (%)   Basophils Absolute 0.0  0.0 - 0.1 (K/uL)  COMPREHENSIVE METABOLIC PANEL      Component Value Range   Sodium 135  135 - 145 (mEq/L)   Potassium 4.0  3.5 - 5.1 (mEq/L)   Chloride 99  96 - 112 (mEq/L)   CO2 26  19 - 32 (mEq/L)   Glucose, Bld 113 (*) 70 - 99 (mg/dL)   BUN 9  6 - 23 (mg/dL)   Creatinine, Ser 9.14  0.50 - 1.35 (mg/dL)   Calcium 9.6  8.4 - 78.2 (mg/dL)   Total Protein 7.8  6.0 - 8.3 (g/dL)   Albumin 3.6  3.5 - 5.2 (g/dL)   AST 17  0 - 37 (U/L)   ALT 9  0 - 53 (U/L)   Alkaline Phosphatase 96  39 - 117 (U/L)   Total Bilirubin 0.3  0.3 - 1.2 (mg/dL)   GFR calc non Af Amer >90  >90 (mL/min)   GFR calc Af Amer >90  >90 (mL/min)  LIPASE, BLOOD      Component Value Range   Lipase 48  11 - 59 (U/L)   Dg Abd Acute W/chest 03/18/2012  *RADIOLOGY REPORT*  Clinical Data: Abdominal pain with nausea and vomiting.  Evaluate for small bowel obstruction.  ACUTE ABDOMEN SERIES (ABDOMEN 2 VIEW &  CHEST 1 VIEW)  Comparison: Abdominal CT 06/21/2011.  Chest radiographs 08/14/2009.  Findings: The heart size and mediastinal contours are normal. The  lungs are clear. There is no pleural effusion or pneumothorax. No acute osseous findings are identified. There are stable prominent nipple shadows.  There are postsurgical changes throughout the abdomen with bowel anastomosis clips in the left abdomen and pelvis.  The abdomen is nearly gasless.  There are scattered air fluid levels.  No significantly distended loops of small or large bowel are seen. There is no free intraperitoneal air.  Left pelvic phleboliths appear unchanged.  IMPRESSION: Nearly gasless abdomen with scattered air fluid levels.  No evidence of bowel obstruction.  No acute cardiopulmonary process.  Original Report Authenticated By: Gerrianne Scale, M.D.      1715:  Pt has tol PO well while in the ED without N/V.  No stooling while in the ED.  VS remain stable, abd benign, resps easy.  Pt made aware that I will not be rx him more narcotic pain meds today just because he endorses he "threw them out because I was mad," as he has a pain contract and needs to contact his PMD.  Verb understanding and requesting "another dose of pain meds here before I leave then."  Dx testing d/w pt.  Questions answered.  Verb understanding, agreeable to d/c home with outpt f/u.          Laray Anger, DO 03/20/12 2157

## 2012-03-18 NOTE — ED Notes (Signed)
Pt states abd pain and emesis since early am, emesis x 8-10 since last night. Pt reports last BM 5am which was yellow liquid.

## 2012-03-18 NOTE — ED Notes (Signed)
JWJ:[id number]<NW> Expected date:03/18/12<BR> Expected time:<BR> Means of arrival:<BR> Comments:<BR> EMS 41 gC - n/v/cheswall pain

## 2012-03-18 NOTE — Discharge Instructions (Signed)
RESOURCE GUIDE  Dental Problems  Patients with Medicaid: [facility]                     [facility] [address]                                           [address] Phone:  [phone]                                                  Phone:  [phone]  If unable to pay or uninsured, contact:  [facility] or [facility] to become qualified for the adult dental clinic.  Chronic Pain Problems Contact [facility]  [phone] Patients need to be referred by their primary care doctor.  Insufficient Money for Medicine Contact [employer] Way:  call "211" or [facility] 271-5999.  No Primary Care Doctor Call [facility]  832-8000 Other agencies that provide inexpensive medical care    [facility]  832-8035    [facility]  832-7272    [facility]  [phone]    [facility] Clinic  832-4777    [facility]  [phone]    [facility]  [phone]  Psychological Services [facility]  [phone] [facility]  [phone] [facility]   800 853-5163 (emergency services 641-4993)  Substance Abuse Resources [facility]  336-882-2125 [facility] 336-784-9470 [facility] 336-285-9073 [facility] 336-845-3988 Residential & Outpatient Substance Abuse Program  800-659-3381  Abuse/Neglect [place] Child Abuse Hotline (336) 641-3795 [place] Child Abuse Hotline 800-378-5315 (After Hours)  Emergency Shelter [facility] (336) 271-5985  M[facility] (336) 275-9566 [facility] (704) 372-4663  MRSA Hotline #:   832-7006    [place] [facility]  [facility]     [employer]                          [facility] [address] [place]                       [address]      [address]  [place]                                                [place]                            [place] Phone:  [phone]                                   Phone:  [phone]                 Phone:  [phone]  [facility] Phone:  342-8316    [facility] Child Abuse Hotline 517-044-7032 360-353-3470 (After Hours)    Take the prescription as directed.  Increase your fluid intake (ie:  Gatoraide) for the next few days.  Eat a bland diet and advance to your regular diet slowly as you can tolerate it.   Avoid full strength juices, as well as milk and milk products until your diarrhea has resolved.   Call your regular medical doctor tomorrow to schedule a follow up appointment within the next 2 days.  Return to the Emergency Department immediately if not improving (or even worsening) despite taking the medicines as prescribed, any black or bloody stool or vomit, if you develop a fever, or for any other concerns.

## 2012-03-24 NOTE — Telephone Encounter (Signed)
LMOM for C/B to set up GXT.

## 2012-04-01 ENCOUNTER — Ambulatory Visit (INDEPENDENT_AMBULATORY_CARE_PROVIDER_SITE_OTHER): Payer: Medicare Other | Admitting: Emergency Medicine

## 2012-04-01 VITALS — BP 111/74 | HR 106 | Temp 98.2°F | Resp 18 | Ht 66.0 in | Wt 104.0 lb

## 2012-04-01 DIAGNOSIS — K859 Acute pancreatitis without necrosis or infection, unspecified: Secondary | ICD-10-CM

## 2012-04-01 DIAGNOSIS — M549 Dorsalgia, unspecified: Secondary | ICD-10-CM

## 2012-04-01 MED ORDER — OXYCODONE-ACETAMINOPHEN 10-325 MG PO TABS: 1.0000 | ORAL_TABLET | Freq: Four times a day (QID) | ORAL | Status: DC | PRN

## 2012-04-01 MED ORDER — MORPHINE SULFATE CR 30 MG PO TB12: 30.0000 mg | ORAL_TABLET | Freq: Two times a day (BID) | ORAL | Status: DC

## 2012-04-01 NOTE — Progress Notes (Signed)
  Subjective:    Patient ID: Jerry Knight, male    DOB: 01-05-1961, 51 y.o.   MRN: [id number]  HPI presents for followup of his chronic pancreatitis. He doing fairly well to his sinus surgery approximately one month ago and did well with this emergency room approximately 10 days ago with nausea vomiting and diarrhea. He was treated there with IV fluids and pain medication. He was not discharged on medication.    Review of Systems no real changes since his last visit he is trying to eat better.     Objective:   Physical Exam alert cooperative male who is not in any acute distress. Neck is chest is clear to auscultation and percussion. Heart regular rate no murmurs. Abdomen soft no tenderness        Assessment & Plan:  Assessment is chronic pancreatitis with chronic abdominal pain. Weight loss is related to his chronic pancreatitis and inability to absorb food. He is followed by the GI doctor for this. I have refilled his medications for one month. We'll recheck 1 month.

## 2012-05-01 ENCOUNTER — Ambulatory Visit (INDEPENDENT_AMBULATORY_CARE_PROVIDER_SITE_OTHER): Payer: Medicare Other | Admitting: Emergency Medicine

## 2012-05-01 VITALS — BP 110/75 | HR 121 | Temp 98.9°F | Resp 18 | Ht 66.0 in | Wt 99.8 lb

## 2012-05-01 DIAGNOSIS — R52 Pain, unspecified: Secondary | ICD-10-CM

## 2012-05-01 DIAGNOSIS — K861 Other chronic pancreatitis: Secondary | ICD-10-CM

## 2012-05-01 DIAGNOSIS — R634 Abnormal weight loss: Secondary | ICD-10-CM

## 2012-05-01 DIAGNOSIS — R5383 Other fatigue: Secondary | ICD-10-CM

## 2012-05-01 MED ORDER — OXYCODONE-ACETAMINOPHEN 10-325 MG PO TABS: 1.0000 | ORAL_TABLET | Freq: Four times a day (QID) | ORAL | Status: DC | PRN

## 2012-05-01 MED ORDER — MORPHINE SULFATE ER 30 MG PO TBCR: 30.0000 mg | EXTENDED_RELEASE_TABLET | Freq: Two times a day (BID) | ORAL | Status: DC

## 2012-05-01 NOTE — Progress Notes (Signed)
  Subjective:    Patient ID: Jerry Knight, male    DOB: 25-Jun-1961, 51 y.o.   MRN: [id number]  HPI patient enters for refill of his medications. He is currently on MS Contin 30 twice a day and Percocet 09/03/2024. He's been compliant with medications. He has experienced intermittent episodes of what sound like nystagmus. At times when he has these episodes he feels he needs to lay down. He states he has had these episodes since he was a small child. He has never been convinced about what caused these episodes but feels like they were related to trauma he had as a small child. He does not vomit with these episodes. He has had no associated tonic clonic episodes with them. He had recent scans done prior to sinus surgery with Dr. Baldomero Lamy. We do not have copies of the scans in our records.    Review of Systems     Objective:   Physical Exam patient has dropped 10 pounds since his last visit. His chest was clear heart regular rate abdomen was soft with a large healed midline scar.        Assessment & Plan:  Patient has lost weight since his last visit here. He has chronic pancreatitis and goes through episodes where his weight. He states he has been taking his pancreatic enzymes and trying heat regularly. He has a lot of stress at home related to family issues. He's been trying heat regular and not skip meals. His medications were refilled for one month we'll recheck in one month. I will try and get copies of his CT MRI of the head done through Dr. Baldomero Lamy at [facility].

## 2012-05-05 ENCOUNTER — Encounter: Payer: Self-pay | Admitting: Emergency Medicine

## 2012-05-07 NOTE — Telephone Encounter (Signed)
Left pt  a message for pt to call to schedule a GXT.

## 2012-05-19 ENCOUNTER — Other Ambulatory Visit: Payer: Self-pay | Admitting: Physician Assistant

## 2012-05-27 ENCOUNTER — Ambulatory Visit (INDEPENDENT_AMBULATORY_CARE_PROVIDER_SITE_OTHER): Payer: Medicare Other | Admitting: Emergency Medicine

## 2012-05-27 VITALS — BP 120/79 | HR 85 | Resp 16 | Ht 65.5 in | Wt 94.8 lb

## 2012-05-27 DIAGNOSIS — R52 Pain, unspecified: Secondary | ICD-10-CM

## 2012-05-27 MED ORDER — MORPHINE SULFATE ER 30 MG PO TBCR: 30.0000 mg | EXTENDED_RELEASE_TABLET | Freq: Two times a day (BID) | ORAL | Status: DC

## 2012-05-27 MED ORDER — OXYCODONE-ACETAMINOPHEN 10-325 MG PO TABS: 1.0000 | ORAL_TABLET | ORAL | Status: DC | PRN

## 2012-05-27 NOTE — Progress Notes (Signed)
  Subjective:    Patient ID: Jerry Knight, male    DOB: [birth year], 51 y.o.   MRN: [id number]  HPI patient states he has had increasing abdominal pain and that is the reason he is taking more Percocet than normal. He is requesting to extra Percocet a day and one extra morphine tablet a day. He states he has not been a with EEG because his pancreatitis has flared. He states he has not seen Dr. Carlota Raspberry recently but is willing to make an appointment. He's been talking to his sponsor at least 3 times a week and going 2-3 meetings a week. 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 His chest is clear. Heart regular rate no murmurs. Abdomen is soft nontender except in the midepigastrium        Assessment & Plan:  Patient seemed more depressed in usual he's going to talk to one of his sponsor since afternoon. He also going to talk to good for him he had a diminished and see if they can help with the. I did agree to give him one extra Percocet today but refused to increase his morphine dose. Recheck one month. He does state he will make an appointment to see Dr. Elnoria Howard.

## 2012-06-01 ENCOUNTER — Telehealth: Payer: Self-pay

## 2012-06-01 NOTE — Telephone Encounter (Signed)
Pt notified that per dr Cleta Alberts we cannot and will not increase med

## 2012-06-01 NOTE — Telephone Encounter (Signed)
PATIENT BROUGHT IN PAPERWORK FROM DR. HUNG'S OFFICE FOR DR. DAUB.  DR. Elnoria Howard IS SUGGESTING DILAUDID FOR THE PATIENT'S PAIN OR INCREASING HIS PERCOCET AND/OR MORPHINE BY ONE PILL A DAY.  HE SAID DILAUDID WOULD BE THE BEST FOR HIS PAIN.  CAN WE CALL THIS IN?

## 2012-06-03 ENCOUNTER — Other Ambulatory Visit: Payer: Self-pay | Admitting: Emergency Medicine

## 2012-06-03 DIAGNOSIS — K859 Acute pancreatitis without necrosis or infection, unspecified: Secondary | ICD-10-CM

## 2012-06-10 ENCOUNTER — Telehealth: Payer: Self-pay

## 2012-06-10 ENCOUNTER — Other Ambulatory Visit: Payer: Self-pay | Admitting: Gastroenterology

## 2012-06-10 DIAGNOSIS — R109 Unspecified abdominal pain: Secondary | ICD-10-CM

## 2012-06-10 DIAGNOSIS — K861 Other chronic pancreatitis: Secondary | ICD-10-CM

## 2012-06-10 NOTE — Telephone Encounter (Signed)
LMOM to call back

## 2012-06-10 NOTE — Telephone Encounter (Signed)
The patient called to request MRI/CT of head and pancreas. The patient feels he also has a kidney cyst again and would like that evaluated as well.  Please call the patient at [phone]

## 2012-06-10 NOTE — Telephone Encounter (Signed)
Called and spoke to Dr Haywood Pao nurse per Dr Ellis Parents request to ask whether Dr Elnoria Howard had wanted CT Abd/pelvis for pt. It was ordered under Dr Ellis Parents name, but Dr Cleta Alberts does not have any knowledge of order and not notes in Epic about Dr Cleta Alberts wanting to order this, and it was denied by insurance. Dr Haywood Pao nurse stated that Dr Elnoria Howard had seen pt recently and wanted to see results when CT abd done because he thought it was warranted to have it done. Dr Haywood Pao nurse agreed that she will notify Dr Elnoria Howard that it was denied and he will try to reorder test himself if he wants it done.

## 2012-06-10 NOTE — Telephone Encounter (Signed)
Dr. Cleta Alberts, this patient would like an MRI of his head and pancreas.  Would like to know what he should do about the cyst on his right kidney.

## 2012-06-10 NOTE — Telephone Encounter (Signed)
Please call Jerry Knight. tell him I'm trying to get approval for a CT of his head.. he should check with Dr. Elnoria Howard to see if Dr. Elnoria Howard wants him to go ahead and have a CT of his abdomen that would include his pancreas and his kidneys if Dr. Elnoria Howard does not want to do the scan I can talk to him about it on his next visit here.

## 2012-06-11 NOTE — Telephone Encounter (Signed)
LMOM for pt that we are working on CT of head approval by insurance and will be in touch w/him after that can be set up. Explained that we could not get CT Abd/Pelvis approved and I spoke w/Dr [facility] nurse yesterday to see if he can order this for pt and get it approved since he is the specialist managing this condition and they stated they will take care of it and be in touch w/pt (see note under phone message 7/10). Advised pt that he can check w/Dr Hung's office to check status.

## 2012-06-15 ENCOUNTER — Other Ambulatory Visit: Payer: Self-pay | Admitting: Gastroenterology

## 2012-06-15 ENCOUNTER — Ambulatory Visit
Admission: RE | Admit: 2012-06-15 | Discharge: 2012-06-15 | Disposition: A | Discharge status: Home / Self Care | Payer: Medicare Other | Source: Ambulatory Visit | Attending: Gastroenterology | Admitting: Gastroenterology

## 2012-06-15 DIAGNOSIS — R109 Unspecified abdominal pain: Secondary | ICD-10-CM

## 2012-06-15 DIAGNOSIS — K861 Other chronic pancreatitis: Secondary | ICD-10-CM

## 2012-06-17 ENCOUNTER — Ambulatory Visit
Admission: RE | Admit: 2012-06-17 | Discharge: 2012-06-17 | Disposition: A | Discharge status: Home / Self Care | Payer: Medicare Other | Source: Ambulatory Visit | Attending: Gastroenterology | Admitting: Gastroenterology

## 2012-06-17 DIAGNOSIS — R109 Unspecified abdominal pain: Secondary | ICD-10-CM

## 2012-06-17 DIAGNOSIS — K861 Other chronic pancreatitis: Secondary | ICD-10-CM

## 2012-06-26 ENCOUNTER — Ambulatory Visit (INDEPENDENT_AMBULATORY_CARE_PROVIDER_SITE_OTHER): Payer: Medicare Other | Admitting: Emergency Medicine

## 2012-06-26 VITALS — BP 122/76 | HR 98 | Temp 97.6°F | Resp 16 | Ht 66.0 in | Wt 103.0 lb

## 2012-06-26 DIAGNOSIS — G4700 Insomnia, unspecified: Secondary | ICD-10-CM

## 2012-06-26 DIAGNOSIS — R52 Pain, unspecified: Secondary | ICD-10-CM

## 2012-06-26 MED ORDER — OXYCODONE-ACETAMINOPHEN 10-325 MG PO TABS: ORAL_TABLET | ORAL | Status: DC

## 2012-06-26 MED ORDER — MORPHINE SULFATE ER 30 MG PO TBCR: 30.0000 mg | EXTENDED_RELEASE_TABLET | Freq: Two times a day (BID) | ORAL | Status: DC

## 2012-06-26 MED ORDER — AMITRIPTYLINE HCL 150 MG PO TABS: 150.0000 mg | ORAL_TABLET | Freq: Every day | ORAL | Status: DC

## 2012-06-26 NOTE — Progress Notes (Signed)
  Subjective:    Patient ID: Jerry Knight, male    DOB: 02-09-1961, 51 y.o.   MRN: [id number]  HPI patient enters for recheck of his chronic abdominal pain. He's been stable on his current medication regimen. He is in a better place regarding his depression. Only going to meetings and has been talking with his psychologist from a regular basis. He is still deciding whether he and his partner would stay together or not the    Review of Systems     Objective:   Physical Exam patient is alert and cooperative. His chest is clear to auscultation. Heart regular rate no murmurs. Abdomen is soft large midline scar        Assessment & Plan:  Patient stable on current pain medication regimen. I have increased his Elavil to 150  at bedtime. If this is not stop his insomnia we'll switch to trazodone.

## 2012-07-08 ENCOUNTER — Encounter (HOSPITAL_COMMUNITY): Payer: Self-pay | Admitting: Emergency Medicine

## 2012-07-08 ENCOUNTER — Emergency Department (HOSPITAL_COMMUNITY)
Admission: EM | Admit: 2012-07-08 | Discharge: 2012-07-08 | Disposition: A | Discharge status: Home / Self Care | Payer: Medicare Other | Attending: Emergency Medicine | Admitting: Emergency Medicine

## 2012-07-08 DIAGNOSIS — G43909 Migraine, unspecified, not intractable, without status migrainosus: Secondary | ICD-10-CM | POA: Insufficient documentation

## 2012-07-08 DIAGNOSIS — F17200 Nicotine dependence, unspecified, uncomplicated: Secondary | ICD-10-CM | POA: Insufficient documentation

## 2012-07-08 DIAGNOSIS — R569 Unspecified convulsions: Secondary | ICD-10-CM | POA: Insufficient documentation

## 2012-07-08 DIAGNOSIS — Z888 Allergy status to other drugs, medicaments and biological substances status: Secondary | ICD-10-CM | POA: Insufficient documentation

## 2012-07-08 DIAGNOSIS — L02419 Cutaneous abscess of limb, unspecified: Secondary | ICD-10-CM

## 2012-07-08 DIAGNOSIS — Z79899 Other long term (current) drug therapy: Secondary | ICD-10-CM | POA: Insufficient documentation

## 2012-07-08 DIAGNOSIS — G8929 Other chronic pain: Secondary | ICD-10-CM | POA: Insufficient documentation

## 2012-07-08 DIAGNOSIS — IMO0002 Reserved for concepts with insufficient information to code with codable children: Secondary | ICD-10-CM | POA: Insufficient documentation

## 2012-07-08 DIAGNOSIS — E164 Increased secretion of gastrin: Secondary | ICD-10-CM | POA: Insufficient documentation

## 2012-07-08 MED ORDER — SULFAMETHOXAZOLE-TMP DS 800-160 MG PO TABS
1.0000 | ORAL_TABLET | Freq: Once | ORAL | Status: AC
  Administered 2012-07-08: 1 via ORAL
  Filled 2012-07-08: qty 1

## 2012-07-08 MED ORDER — OXYCODONE-ACETAMINOPHEN 5-325 MG PO TABS
1.0000 | ORAL_TABLET | Freq: Once | ORAL | Status: AC
  Administered 2012-07-08: 1 via ORAL
  Filled 2012-07-08: qty 1

## 2012-07-08 MED ORDER — SULFAMETHOXAZOLE-TRIMETHOPRIM 800-160 MG PO TABS: 1.0000 | ORAL_TABLET | Freq: Two times a day (BID) | ORAL | Status: AC

## 2012-07-08 NOTE — ED Notes (Signed)
Pt here with abscess to left AC area pt sts from possible spider bite; pt noted to have scaring in right AC sts from brown recluse bite years ago; pt sts x 4 days; red swollen area noted

## 2012-07-08 NOTE — ED Provider Notes (Signed)
History  This chart was scribed for Jerry Booze, MD by Shari Heritage. The patient was seen in room TR11C/TR11C. Patient's care was started at 1311.     CSN: [id number]  Arrival date & time 07/08/12  1311   First MD Initiated Contact with Patient 07/08/12 1438      Chief Complaint  Patient presents with  . Abscess    (Consider location/radiation/quality/duration/timing/severity/associated sxs/prior treatment) The history is provided by the patient. No language interpreter was used.   Jerry Knight is a 51 y.o. male who presents to the Emergency Department complaining of an abscess to left antecubital area from a potential black recluse spider onset 3 days ago.There is associated pain, redness and swelling. Patient rates the pain as 10/10. Patient says that he has tried to drain the area with a scalpel and 25-gauge needle with no success. Patient is a current smoker (3 packs/day). Patient has a history of pancreatitis, Zollinger-Ellison syndrome, seizure disorder and pain management contract agreement. His surgical history includes partial gastrectomy.   Past Medical History  Diagnosis Date  . Chest pain   . Chronic pancreatitis   . 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 Migraine   . Seizure disorder     x 7 years  . Zollinger-Ellison syndrome   . GI bleed   . Chronic pain   . Migraine headache   . Chronic neck pain   . Pain management contract agreement     Past Surgical History  Procedure Date  . Partial gastrectomy     Family History  Problem Relation Age of Onset  . Angina Mother   . Nephrolithiasis Father     History  Substance Use Topics  . Smoking status: Current Everyday Smoker  . Smokeless tobacco: Not on file   Comment: about 2 packs a day  . Alcohol Use: No      Review of Systems  Allergies  Sonata  Home Medications   Current Outpatient Rx  Name Route Sig Dispense Refill  . ALBUTEROL SULFATE HFA 108 (90 BASE) MCG/ACT IN AERS Inhalation  Inhale 3 puffs into the lungs daily as needed. For shortness of breath    . AMITRIPTYLINE HCL 150 MG PO TABS Oral Take 1 tablet (150 mg total) by mouth at bedtime. 30 tablet 5  . MORPHINE SULFATE ER 30 MG PO TBCR Oral Take 1 tablet (30 mg total) by mouth 2 (two) times daily. 60 tablet 0  . OXYCODONE-ACETAMINOPHEN 10-325 MG PO TABS Oral Take 1-2 tablets by mouth 3 (three) times daily as needed. Take 2 tablets in morning, 1 tablet in afternoon, and 2 tablets in the evening      BP 124/92  Pulse 114  Temp 98.8 F (37.1 C) (Oral)  Resp 18  SpO2 98%  Physical Exam  Constitutional: He is oriented to person, place, and time. He appears well-developed and well-nourished.  HENT:  Head: Normocephalic and atraumatic.  Musculoskeletal:       Left antecubital space has 3 x 2 cm area of induration. No fluctuance. No overlying erythema.  Neurological: He is alert and oriented to person, place, and time.    ED Course  Procedures (including critical care time) DIAGNOSTIC STUDIES: Oxygen Saturation is 98% on room air, normal by my interpretation.    COORDINATION OF CARE: 2:45pm- Patient informed of current plan for treatment and evaluation and agrees with plan at this time. Performed a bedside US to check for pus at  site of induration to left antecubital space. Will admininster Bactrim and Percocet.  3:15pm- Performed I&D procedure. INCISION AND DRAINAGE PROCEDURE NOTE: Patient identification was confirmed and verbal consent was obtained. This procedure was performed by Jerry Booze, MD at 3:15 PM. Site: left antecubital space Needle size: 27 gauge Anesthetic used (type and amt): 2% Lidocaine w/o epi Blade size: #11 Scalpel Drainage: No pus. Only blood drainage. Complexity: Complex Packing used: 1/4 Iodoform gauze Site anesthetized, incision made over site, wound drained and explored loculations, rinsed with copious amounts of normal saline, wound packed with sterile gauze, covered with dry,  sterile dressing.  Pt tolerated procedure well without complications.  Instructions for care discussed verbally and pt provided with additional written instructions for homecare and f/u.   1. Abscess, elbow       MDM  Abscess with the fluid collection documented by ultrasound. Incision and drainage will need to be done and he will be treated with Bactrim DS.   I personally performed the services described in this documentation, which was scribed in my presence. The recorded information has been reviewed and considered.   Jerry Booze, MD 07/10/12 1447

## 2012-07-29 ENCOUNTER — Ambulatory Visit (INDEPENDENT_AMBULATORY_CARE_PROVIDER_SITE_OTHER): Payer: Medicare Other | Admitting: Emergency Medicine

## 2012-07-29 VITALS — BP 110/80 | HR 94 | Temp 98.7°F | Resp 16 | Ht 67.0 in | Wt 107.0 lb

## 2012-07-29 DIAGNOSIS — IMO0001 Reserved for inherently not codable concepts without codable children: Secondary | ICD-10-CM

## 2012-07-29 DIAGNOSIS — G4700 Insomnia, unspecified: Secondary | ICD-10-CM

## 2012-07-29 DIAGNOSIS — K219 Gastro-esophageal reflux disease without esophagitis: Secondary | ICD-10-CM

## 2012-07-29 DIAGNOSIS — L02414 Cutaneous abscess of left upper limb: Secondary | ICD-10-CM

## 2012-07-29 DIAGNOSIS — K859 Acute pancreatitis without necrosis or infection, unspecified: Secondary | ICD-10-CM

## 2012-07-29 DIAGNOSIS — R634 Abnormal weight loss: Secondary | ICD-10-CM

## 2012-07-29 DIAGNOSIS — R52 Pain, unspecified: Secondary | ICD-10-CM

## 2012-07-29 MED ORDER — OMEPRAZOLE 20 MG PO CPDR: 20.0000 mg | DELAYED_RELEASE_CAPSULE | Freq: Every day | ORAL | Status: DC

## 2012-07-29 MED ORDER — MORPHINE SULFATE ER 30 MG PO TBCR: 30.0000 mg | EXTENDED_RELEASE_TABLET | Freq: Two times a day (BID) | ORAL | Status: DC

## 2012-07-29 MED ORDER — OXYCODONE-ACETAMINOPHEN 10-325 MG PO TABS: 1.0000 | ORAL_TABLET | Freq: Three times a day (TID) | ORAL | Status: DC | PRN

## 2012-07-29 MED ORDER — TRAZODONE HCL 50 MG PO TABS: 50.0000 mg | ORAL_TABLET | Freq: Every day | ORAL | Status: DC

## 2012-07-29 NOTE — Progress Notes (Signed)
  Subjective:    Patient ID: Jerry Knight, male    DOB: 07-Jun-1961, 51 y.o.   MRN: [id number]  HPI Patient here for increased symptoms of acid reflux. History of colon resection, has had multiple surgeries for this most recent was 1997.States he is eating well and not getting any relief with the  Zantac he has been taking. States has to sleep with his head elevated and can not lie on right side at all. This wakes him up at night.  Has recent hospitalization for his left arm infected burn of antecubital fossa. Is asking for refills of pain medications for his chronic pancreatitis. Asking for something to take the place of his Elavil. He feels like this medication is not helping him any more he has been taking this for 20 years.   Review of Systems  Objective:   Physical Exam left elbow shows area of scar in antecubital fossa. No redness or purulence noted. Was treated in ER with packing and antibiotics.  Chest is clear cardiac exam is unremarkable the abdomen has 2 large midline scars without tenderness      Assessment & Plan:  Medications renewed for chronic pancreatitis. He was also given a prescription for Prilosec to take for reflux I have stopped his Elavil we'll try trazodone 50 mg at night for insomnia

## 2012-08-27 ENCOUNTER — Ambulatory Visit (INDEPENDENT_AMBULATORY_CARE_PROVIDER_SITE_OTHER): Payer: Medicare Other | Admitting: Emergency Medicine

## 2012-08-27 VITALS — BP 132/72 | HR 97 | Temp 98.1°F | Resp 16 | Ht 66.0 in | Wt 112.8 lb

## 2012-08-27 DIAGNOSIS — R1013 Epigastric pain: Secondary | ICD-10-CM | POA: Insufficient documentation

## 2012-08-27 DIAGNOSIS — K861 Other chronic pancreatitis: Secondary | ICD-10-CM | POA: Insufficient documentation

## 2012-08-27 MED ORDER — OXYCODONE-ACETAMINOPHEN 10-325 MG PO TABS: 1.0000 | ORAL_TABLET | Freq: Three times a day (TID) | ORAL | Status: DC | PRN

## 2012-08-27 MED ORDER — MORPHINE SULFATE ER 30 MG PO TBCR: 30.0000 mg | EXTENDED_RELEASE_TABLET | Freq: Two times a day (BID) | ORAL | Status: DC

## 2012-08-27 NOTE — Progress Notes (Signed)
  Subjective:    Patient ID: Jerry Knight, male    DOB: [birth year], 51 y.o.   MRN: [id number]  HPI 51 year old male presents with need for medication refills. He states that he has gained a little weight, eating well, depression is getting better. It is not nearly as depressed as his last visit. He has been seeing a sponsor regular. 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 He also has a place to stay here and couldn't   Review of Systems     Objective:   Physical Exam Jerry Knight looks happier today next upper chest clear cardiac unremarkable. Abdomen has 2 midline scars but nontender.        Assessment & Plan:  Patient states he would like to decrease his pain medication by 10 pills per month. Other medications remain the same .

## 2012-09-25 ENCOUNTER — Ambulatory Visit (INDEPENDENT_AMBULATORY_CARE_PROVIDER_SITE_OTHER): Payer: Medicare Other | Admitting: Family Medicine

## 2012-09-25 VITALS — BP 108/76 | HR 72 | Temp 98.3°F | Resp 16 | Ht 67.0 in | Wt 110.0 lb

## 2012-09-25 DIAGNOSIS — G8929 Other chronic pain: Secondary | ICD-10-CM

## 2012-09-25 DIAGNOSIS — Z720 Tobacco use: Secondary | ICD-10-CM

## 2012-09-25 DIAGNOSIS — F17200 Nicotine dependence, unspecified, uncomplicated: Secondary | ICD-10-CM

## 2012-09-25 MED ORDER — OXYCODONE-ACETAMINOPHEN 10-325 MG PO TABS: 1.0000 | ORAL_TABLET | Freq: Three times a day (TID) | ORAL | Status: DC | PRN

## 2012-09-25 MED ORDER — MORPHINE SULFATE ER 30 MG PO TBCR: 30.0000 mg | EXTENDED_RELEASE_TABLET | Freq: Two times a day (BID) | ORAL | Status: DC

## 2012-09-25 NOTE — Progress Notes (Signed)
[facility] [address] [place] [address] [phone]  Date:  09/25/2012   Name:  Jerry Knight   DOB:  Nov 18, 1961   MRN:  [id number]  PCP:  [name], MD    Chief Complaint: Medication Refill   History of Present Illness:  Jerry Knight is a 51 y.o. very pleasant male patient who presents with the following:  He is here today for a refill of his medications.  He feels that he is still doing better.  He has some right sided lower back pain, but this is not new. This is a typical symptom of his chronic kidney cysts and pancreatitis.  His mood continues to be better than it was in the past.  He sometimes feels that he needs an antidepressant but "I"m trying to take as little medicine as possible" so he does not want to start anything today.   He wants to continue to drop his pain medication dosage (went down by 10 pills last month) but he does not think he is ready just yet.  However, this is a goal.  No vomiting, no fever.  He does have some GERD which is worse when he is ill.   He is still smoking and would like to stop.  He has tried gum and the patch so far without success.   He might like to try wellbutrin.  However, he has had seizures in the past so this may not be a good combination with his trazadone.   Patient Active Problem List  Diagnosis  . Chest pain  . SOB (shortness of breath)  . Pancreatitis, chronic  . Abdominal pain, epigastric    Past Medical History  Diagnosis Date  . Chest pain   . Chronic pancreatitis   . 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 Migraine   . Seizure disorder     x 7 years  . Zollinger-Ellison syndrome   . GI bleed   . Chronic pain   . Migraine headache   . Chronic neck pain   . Pain management contract agreement   . Depression   . Seizures     Past Surgical History  Procedure Date  . Partial gastrectomy   . Eye surgery   . Hernia repair   . Small intestine surgery   . Cholecystectomy  1993    History  Substance Use Topics  . Smoking status: Current Every Day Smoker -- 2.0 packs/day    Types: Cigarettes    Last Attempt to Quit: 07/29/2000  . Smokeless tobacco: Not on file   Comment: about 2 packs a day  . Alcohol Use: No    Family History  Problem Relation Age of Onset  . Angina Mother   . Nephrolithiasis Father     Allergies  Allergen Reactions  . Sonata ([place])     "black out'  confused    Medication list has been reviewed and updated.  Current Outpatient Prescriptions on File Prior to Visit  Medication Sig Dispense Refill  . albuterol (PROVENTIL HFA;VENTOLIN HFA) 108 (90 BASE) MCG/ACT inhaler Inhale 3 puffs into the lungs daily as needed. For shortness of breath      . morphine (MS CONTIN) 30 MG 12 hr tablet Take 1 tablet (30 mg total) by mouth 2 (two) times daily. Need refills  60 tablet  0  . omeprazole (PRILOSEC) 20 MG capsule Take 1 capsule (20 mg total) by  mouth daily.  30 capsule  3  . oxyCODONE-acetaminophen (PERCOCET) 10-325 MG per tablet Take 1-2 tablets by mouth 3 (three) times daily as needed. Take 2 tablets in morning, 1 tablet in afternoon, and 2 tablets in the evening  NEED REFILLS  140 tablet  0  . traZODone (DESYREL) 50 MG tablet Take 1 tablet (50 mg total) by mouth at bedtime.  30 tablet  11    Review of Systems:  As per HPI- otherwise negative.   Physical Examination: Filed Vitals:   09/25/12 1035  BP: 108/76  Pulse: 72  Temp: 98.3 F (36.8 C)  Resp: 16   Filed Vitals:   09/25/12 1035  Height: 5\' 7"  (1.702 m)  Weight: 110 lb (49.896 kg)   Body mass index is 17.23 kg/(m^2). Ideal Body Weight: Weight in (lb) to have BMI = 25: 159.3   GEN: WDWN, NAD, Non-toxic, A & O x 3, thin build HEENT: Atraumatic, Normocephalic. Neck supple. No masses, No LAD.  Bilateral TM wnl, oropharynx normal.  PEERL,EOMI.   Ears and Nose: No external deformity. CV: RRR, No M/G/R. No JVD. No thrill. No extra heart sounds. PULM: CTA B, no  wheezes, crackles, rhonchi. No retractions. No resp. distress. No accessory muscle use. ABD: S, NT, ND EXTR: No c/c/e NEURO Normal gait.  PSYCH: Normally interactive. Conversant. Not depressed or anxious appearing.  Calm demeanor.    Assessment and Plan: 1. Chronic pain  morphine (MS CONTIN) 30 MG 12 hr tablet, oxyCODONE-acetaminophen (PERCOCET) 10-325 MG per tablet  2. Tobacco abuse     Refilled his medications for pain today.   He wants to decrease his # of percocet at his next RF if he can.  discussed quitting smoking- I would love for him to try wellbutrin, but given his history of seizures will avoid while he is on trazadone.  He suspect his seizures were "caused by drugs" but in any case he likely has a lower seizure threshold.    He will let me know if his symptoms are any different from typical- otherwise he plans to follow- up in about a month for refills   Meds ordered this encounter  Medications  . traZODone (DESYREL) 50 MG tablet    Sig: Take 50 mg by mouth at bedtime.  Marland Kitchen morphine (MS CONTIN) 30 MG 12 hr tablet    Sig: Take 1 tablet (30 mg total) by mouth 2 (two) times daily.    Dispense:  60 tablet    Refill:  0  . oxyCODONE-acetaminophen (PERCOCET) 10-325 MG per tablet    Sig: Take 1-2 tablets by mouth 3 (three) times daily as needed. Take 2 tablets in morning, 1 tablet in afternoon, and 2 tablets in the evening    Dispense:  140 tablet    Refill:  0     Lilya Smitherman, MD

## 2012-09-25 NOTE — Patient Instructions (Addendum)
Good luck with quitting smoking- congratulations on getting to the point of wanting to quit, this is an achievement!  Let me know if you symptoms change or get worse.   Consider calling the [place] quitline  Call [facility] Telephone Service is available 24/7 toll-free at 1-800-QUIT-NOW [phone]).

## 2012-10-28 ENCOUNTER — Ambulatory Visit: Payer: Medicare Other

## 2012-10-28 ENCOUNTER — Telehealth: Payer: Self-pay | Admitting: Emergency Medicine

## 2012-10-28 ENCOUNTER — Ambulatory Visit (INDEPENDENT_AMBULATORY_CARE_PROVIDER_SITE_OTHER): Payer: Medicare Other | Admitting: Emergency Medicine

## 2012-10-28 VITALS — BP 116/80 | HR 80 | Temp 98.4°F | Resp 16 | Ht 67.0 in | Wt 110.6 lb

## 2012-10-28 DIAGNOSIS — G8929 Other chronic pain: Secondary | ICD-10-CM

## 2012-10-28 DIAGNOSIS — R109 Unspecified abdominal pain: Secondary | ICD-10-CM

## 2012-10-28 DIAGNOSIS — K861 Other chronic pancreatitis: Secondary | ICD-10-CM

## 2012-10-28 LAB — POCT CBC
Granulocyte percent: 71.9 %G (ref 37–80)
HCT, POC: 39.9 % — AB (ref 43.5–53.7)
MPV: 9.8 fL (ref 0–99.8)
POC Granulocyte: 7.5 — AB (ref 2–6.9)
POC LYMPH PERCENT: 21.2 %L (ref 10–50)
RDW, POC: 14.3 %

## 2012-10-28 MED ORDER — MORPHINE SULFATE ER 30 MG PO TBCR: 30.0000 mg | EXTENDED_RELEASE_TABLET | Freq: Two times a day (BID) | ORAL | Status: DC

## 2012-10-28 MED ORDER — OXYCODONE-ACETAMINOPHEN 10-325 MG PO TABS: 1.0000 | ORAL_TABLET | Freq: Three times a day (TID) | ORAL | Status: DC | PRN

## 2012-10-28 NOTE — Progress Notes (Signed)
  Subjective:    Patient ID: Jerry Knight, male    DOB: [birth year], 51 y.o.   MRN: [id number]  Chest Pain  Associated symptoms include abdominal pain.   Patient comes into our clinic today complaining of upper abdominal pain for four to five days HX of pancreatis  HX of Ulcers    Review of Systems  Constitutional: Negative for appetite change.  Gastrointestinal: Positive for abdominal pain.       Objective:   Physical Exam there is a midline scar. There is mild upper abdominal tenderness. His lungs are clear his cardiac exam is unremarkable.   [facility] reading (PRIMARY) by  Dr.Jesse Hirst in no acute changes. There are multiple staples and suture material noted . There is no free air  Results for orders placed in visit on 10/28/12  POCT CBC      Component Value Range   WBC 10.5 (*) 4.6 - 10.2 K/uL   Lymph, poc 2.2  0.6 - 3.4   POC LYMPH PERCENT 21.2  10 - 50 %L   MID (cbc) 0.7  0 - 0.9   POC MID % 6.9  0 - 12 %M   POC Granulocyte 7.5 (*) 2 - 6.9   Granulocyte percent 71.9  37 - 80 %G   RBC 4.33 (*) 4.69 - 6.13 M/uL   Hemoglobin 12.1 (*) 14.1 - 18.1 g/dL   HCT, POC 40.9 (*) 81.1 - 53.7 %   MCV 92.1  80 - 97 fL   MCH, POC 27.9  27 - 31.2 pg   MCHC 30.3 (*) 31.8 - 35.4 g/dL   RDW, POC 91.4     Platelet Count, POC 334  142 - 424 K/uL   MPV 9.8  0 - 99.8 fL       Assessment & Plan:  Meds were refilled. X-rays were sent to the radiologist for his opinion. I do not see free air or evidence of obstruction. He as known chronic pancreatitis.

## 2012-11-05 NOTE — Telephone Encounter (Signed)
I called and discussed results of his films as described by the radiologist. If he has worsening abdominal pain nausea or vomiting he is to present to the emergency room

## 2012-11-27 ENCOUNTER — Ambulatory Visit (INDEPENDENT_AMBULATORY_CARE_PROVIDER_SITE_OTHER): Payer: Medicare Other | Admitting: Family Medicine

## 2012-11-27 VITALS — BP 116/81 | HR 91 | Temp 98.5°F | Resp 16 | Ht 65.5 in | Wt 107.2 lb

## 2012-11-27 DIAGNOSIS — G8929 Other chronic pain: Secondary | ICD-10-CM

## 2012-11-27 MED ORDER — OXYCODONE-ACETAMINOPHEN 10-325 MG PO TABS: 1.0000 | ORAL_TABLET | Freq: Three times a day (TID) | ORAL | Status: DC | PRN

## 2012-11-27 MED ORDER — MORPHINE SULFATE ER 30 MG PO TBCR: 30.0000 mg | EXTENDED_RELEASE_TABLET | Freq: Two times a day (BID) | ORAL | Status: DC

## 2012-11-27 NOTE — Progress Notes (Signed)
[facility] [address], [place] [address] [phone]  Date:  11/27/2012   Name:  Jerry Knight   DOB:  August 24, 1961   MRN:  [id number]  PCP:  [name], MD    Chief Complaint: Medication Refill   History of Present Illness:  Jerry Knight is a 51 y.o. very pleasant male patient who presents with the following:  History of chronic pancreatitis for which he takes MS contin BID and Percocet 10 approx 5 per day, he last received RF on 10/28/12.   He is here for a refill today, he is doing ok.  His pain is about the same as usual.  He did have some stress over the holidays which tends to increase his pain some.  He and his wife are having some problems- these are not new, but are still a problem.  They are trying to care for his wife's ill mother in their home but it is very difficult.    He does not have any current vomiting or fever.  No acute flair of his pancreatitis  He declines a flu shot today  Patient Active Problem List  Diagnosis  . Chest pain  . SOB (shortness of breath)  . Pancreatitis, chronic  . Abdominal pain, epigastric    Past Medical History  Diagnosis Date  . Chest pain   . Chronic pancreatitis   . 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 Migraine   . Seizure disorder     x 7 years  . Zollinger-Ellison syndrome   . GI bleed   . Chronic pain   . Migraine headache   . Chronic neck pain   . Pain management contract agreement   . Depression   . Seizures     Past Surgical History  Procedure Date  . Partial gastrectomy   . Eye surgery   . Hernia repair   . Small intestine surgery   . Cholecystectomy 1993    History  Substance Use Topics  . Smoking status: Current Every Day Smoker -- 2.0 packs/day    Types: Cigarettes    Last Attempt to Quit: 07/29/2000  . Smokeless tobacco: Not on file     Comment: about 2 packs a day  . Alcohol Use: No    Family History  Problem Relation Age of Onset  . Angina Mother    . Nephrolithiasis Father     Allergies  Allergen Reactions  . Sonata (Zaleplon)     "black out'  confused    Medication list has been reviewed and updated.  Current Outpatient Prescriptions on File Prior to Visit  Medication Sig Dispense Refill  . albuterol (PROVENTIL HFA;VENTOLIN HFA) 108 (90 BASE) MCG/ACT inhaler Inhale 3 puffs into the lungs daily as needed. For shortness of breath      . morphine (MS CONTIN) 30 MG 12 hr tablet Take 1 tablet (30 mg total) by mouth 2 (two) times daily.  60 tablet  0  . omeprazole (PRILOSEC) 20 MG capsule Take 1 capsule (20 mg total) by mouth daily.  30 capsule  3  . oxyCODONE-acetaminophen (PERCOCET) 10-325 MG per tablet Take 1-2 tablets by mouth 3 (three) times daily as needed. Take 2 tablets in morning, 1 tablet in afternoon, and 2 tablets in the evening  140 tablet  0  . traZODone (DESYREL) 50 MG tablet Take 50 mg by mouth at bedtime.      Marland Kitchen  traZODone (DESYREL) 50 MG tablet Take 1 tablet (50 mg total) by mouth at bedtime.  30 tablet  11    Review of Systems:  As per HPI- otherwise negative.   Physical Examination: Filed Vitals:   11/27/12 0932  BP: 116/81  Pulse: 91  Temp: 98.5 F (36.9 C)  Resp: 16   Filed Vitals:   11/27/12 0932  Height: 5' 5.5" (1.664 m)  Weight: 107 lb 3.2 oz (48.626 kg)   Body mass index is 17.57 kg/(m^2). Ideal Body Weight: Weight in (lb) to have BMI = 25: 152.2   GEN: WDWN, NAD, Non-toxic, A & O x 3, thin build but stable- 110 lbs 10/13 HEENT: Atraumatic, Normocephalic. Neck supple. No masses, No LAD. Ears and Nose: No external deformity. CV: RRR, No M/G/R. No JVD. No thrill. No extra heart sounds. PULM: CTA B, no wheezes, crackles, rhonchi. No retractions. No resp. distress. No accessory muscle use. ABD: S, NT, ND, +BS. No rebound. No HSM. EXTR: No c/c/e NEURO Normal gait.  PSYCH: Normally interactive. Conversant. Not depressed or anxious appearing.  Calm demeanor.    Assessment and Plan: 1.  Chronic pain  morphine (MS CONTIN) 30 MG 12 hr tablet, oxyCODONE-acetaminophen (PERCOCET) 10-325 MG per tablet   Chronic pain- did refills today as usual.  His usage is stable.  He will let us know if any problems   COPLAND,JESSICA, MD

## 2012-11-27 NOTE — Patient Instructions (Addendum)
We will see you in about a month- let us know if any problems in the meantime

## 2012-12-10 ENCOUNTER — Other Ambulatory Visit: Payer: Self-pay | Admitting: Radiology

## 2012-12-10 NOTE — Telephone Encounter (Signed)
Patient wants to know if he can get a renewal on his Albuterol inhaler, he is at pharmacy now. Rx pended please advise I will call patient [phone]

## 2012-12-11 MED ORDER — ALBUTEROL SULFATE HFA 108 (90 BASE) MCG/ACT IN AERS: 3.0000 | INHALATION_SPRAY | Freq: Every day | RESPIRATORY_TRACT | Status: DC | PRN

## 2012-12-24 ENCOUNTER — Telehealth: Payer: Self-pay | Admitting: *Deleted

## 2012-12-24 MED ORDER — ALBUTEROL SULFATE HFA 108 (90 BASE) MCG/ACT IN AERS: INHALATION_SPRAY | RESPIRATORY_TRACT | Status: DC

## 2012-12-24 NOTE — Telephone Encounter (Signed)
[facility] faxed over a request to switch to Proair because ins prefers.  Per Herbert Seta ok to switch.

## 2012-12-28 ENCOUNTER — Ambulatory Visit (INDEPENDENT_AMBULATORY_CARE_PROVIDER_SITE_OTHER): Payer: Medicare Other | Admitting: Family Medicine

## 2012-12-28 VITALS — BP 118/72 | HR 98 | Temp 97.5°F | Resp 17 | Ht 67.0 in | Wt 106.0 lb

## 2012-12-28 DIAGNOSIS — K859 Acute pancreatitis without necrosis or infection, unspecified: Secondary | ICD-10-CM

## 2012-12-28 DIAGNOSIS — K219 Gastro-esophageal reflux disease without esophagitis: Secondary | ICD-10-CM

## 2012-12-28 DIAGNOSIS — G8929 Other chronic pain: Secondary | ICD-10-CM

## 2012-12-28 DIAGNOSIS — G4700 Insomnia, unspecified: Secondary | ICD-10-CM

## 2012-12-28 LAB — COMPREHENSIVE METABOLIC PANEL
Albumin: 4.6 g/dL (ref 3.5–5.2)
CO2: 28 mEq/L (ref 19–32)
Calcium: 10 mg/dL (ref 8.4–10.5)
Chloride: 100 mEq/L (ref 96–112)
Glucose, Bld: 92 mg/dL (ref 70–99)
Sodium: 136 mEq/L (ref 135–145)
Total Bilirubin: 0.7 mg/dL (ref 0.3–1.2)
Total Protein: 7.4 g/dL (ref 6.0–8.3)

## 2012-12-28 LAB — POCT CBC
HCT, POC: 45.2 % (ref 43.5–53.7)
Hemoglobin: 14.2 g/dL (ref 14.1–18.1)
Lymph, poc: 3.2 (ref 0.6–3.4)
MCHC: 31.6 g/dL — AB (ref 31.8–35.4)
POC Granulocyte: 10.3 — AB (ref 2–6.9)
WBC: 14.2 10*3/uL — AB (ref 4.6–10.2)

## 2012-12-28 LAB — LIPASE: Lipase: 12 U/L (ref 0–75)

## 2012-12-28 LAB — AMYLASE: Amylase: 42 U/L (ref 0–105)

## 2012-12-28 MED ORDER — TRAZODONE HCL 50 MG PO TABS: 100.0000 mg | ORAL_TABLET | Freq: Every day | ORAL | Status: DC

## 2012-12-28 MED ORDER — MORPHINE SULFATE ER 30 MG PO TBCR: 30.0000 mg | EXTENDED_RELEASE_TABLET | Freq: Two times a day (BID) | ORAL | Status: DC

## 2012-12-28 MED ORDER — OMEPRAZOLE 20 MG PO CPDR: 20.0000 mg | DELAYED_RELEASE_CAPSULE | Freq: Every day | ORAL | Status: DC

## 2012-12-28 MED ORDER — OXYCODONE-ACETAMINOPHEN 10-325 MG PO TABS: 1.0000 | ORAL_TABLET | Freq: Three times a day (TID) | ORAL | Status: DC | PRN

## 2012-12-28 NOTE — Progress Notes (Signed)
[facility] [address], [place] [address] [phone]  Date:  12/28/2012   Name:  RAYDIN BIELINSKI   DOB:  [birth year]   MRN:  [id number]  PCP:  [name], MD    Chief Complaint: Medication Refill and Back Pain   History of Present Illness:  KHIYAN CRACE is a 52 y.o. very pleasant male patient who presents with the following:  Here today for a recheck.  He needs a RF of medications for his chronic pancreatitis. He states he also needs a "cat scan or MRI for my pancreas and my liver, one of them is swollen up and I can't tell which."  Dr. Elnoria Howard is his GI doctor.  He last saw him about 3 months ago.  He states he has thrown up about a dozen times in the last week and is having pain in his belly, especially his RUQ.  He is also having diarrhea.   He is still eating a light diet, and he is drinking liquids such as decaffinated mountain dew.  He is also down to 1.5 packs of cigarettes a day (from 3 a day).     He changed from amitriptyline to to trazodone about 3 months ago.  However, he notes he is not sleeping well with this medication. He states he did better in the past with restoril.  However, I am concerned about excessive sedation with this medication given his heavy use of narcotics.   He tried taking 2 of his trazadone for a little while when he first started the medication. This did not necessarily help him sleep better.  However, he has been taking just one for the last several months.    He also needs a RF of his nexium- it actually looks like he is on prilosec instead.   He states he had a hard time getting his pain medication to last over the last month. He woulld like to have 150 percocet if possible this month  Patient Active Problem List  Diagnosis  . Chest pain  . SOB (shortness of breath)  . Pancreatitis, chronic  . Abdominal pain, epigastric    Past Medical History  Diagnosis Date  . Chest pain   . Chronic pancreatitis   .  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 Migraine   . Seizure disorder     x 7 years  . Zollinger-Ellison syndrome   . GI bleed   . Chronic pain   . Migraine headache   . Chronic neck pain   . Pain management contract agreement   . Depression   . Seizures     Past Surgical History  Procedure Date  . Partial gastrectomy   . Eye surgery   . Hernia repair   . Small intestine surgery   . Cholecystectomy 1993    History  Substance Use Topics  . Smoking status: Current Every Day Smoker -- 2.0 packs/day    Types: Cigarettes    Last Attempt to Quit: 07/29/2000  . Smokeless tobacco: Not on file     Comment: about 2 packs a day  . Alcohol Use: No    Family History  Problem Relation Age of Onset  . Angina Mother   . Nephrolithiasis Father     Allergies  Allergen Reactions  . Sonata (Zaleplon)     "black out'  confused    Medication list has been reviewed and updated.  Current  Outpatient Prescriptions on File Prior to Visit  Medication Sig Dispense Refill  . albuterol (PROAIR HFA) 108 (90 BASE) MCG/ACT inhaler Inhale 3 puffs into the lungs daily as needed for shortness of breath.  8.5 g  0  . morphine (MS CONTIN) 30 MG 12 hr tablet Take 1 tablet (30 mg total) by mouth 2 (two) times daily.  60 tablet  0  . omeprazole (PRILOSEC) 20 MG capsule Take 1 capsule (20 mg total) by mouth daily.  30 capsule  3  . oxyCODONE-acetaminophen (PERCOCET) 10-325 MG per tablet Take 1-2 tablets by mouth 3 (three) times daily as needed. Take 2 tablets in morning, 1 tablet in afternoon, and 2 tablets in the evening  140 tablet  0  . traZODone (DESYREL) 50 MG tablet Take 50 mg by mouth at bedtime.      . traZODone (DESYREL) 50 MG tablet Take 1 tablet (50 mg total) by mouth at bedtime.  30 tablet  11    Review of Systems:  As per HPI- otherwise negative.   Physical Examination: Filed Vitals:   12/28/12 1025  BP: 118/72  Pulse: 98  Temp: 97.5 F (36.4 C)  Resp: 17   Filed Vitals:     12/28/12 1025  Height: 5\' 7"  (1.702 m)  Weight: 106 lb (48.081 kg)   Body mass index is 16.60 kg/(m^2). Ideal Body Weight: Weight in (lb) to have BMI = 25: 159.3   GEN: WDWN, NAD, Non-toxic, A & O x 3, cachetic build HEENT: Atraumatic, Normocephalic. Neck supple. No masses, No LAD. Ears and Nose: No external deformity. CV: RRR, No M/G/R. No JVD. No thrill. No extra heart sounds. PULM: CTA B, no wheezes, crackles, rhonchi. No retractions. No resp. distress. No accessory muscle use. ABD: S, thin build, ND, +BS. No rebound. No HSM. He has mild RUQ tenderness  EXTR: No c/c/e NEURO Normal gait.  PSYCH: Normally interactive. Conversant. Not depressed or anxious appearing.  Calm demeanor.   Results for orders placed in visit on 12/28/12  POCT CBC      Component Value Range   WBC 14.2 (*) 4.6 - 10.2 K/uL   Lymph, poc 3.2  0.6 - 3.4   POC LYMPH PERCENT 22.7  10 - 50 %L   MID (cbc) 0.7  0 - 0.9   POC MID % 5.0  0 - 12 %M   POC Granulocyte 10.3 (*) 2 - 6.9   Granulocyte percent 72.3  37 - 80 %G   RBC 4.88  4.69 - 6.13 M/uL   Hemoglobin 14.2  14.1 - 18.1 g/dL   HCT, POC 21.3  08.6 - 53.7 %   MCV 92.3  80 - 97 fL   MCH, POC 29.1  27 - 31.2 pg   MCHC 31.6 (*) 31.8 - 35.4 g/dL   RDW, POC 57.8     Platelet Count, POC 364  142 - 424 K/uL   MPV 10.1  0 - 99.8 fL   Assessment and Plan: 1. Chronic pain  oxyCODONE-acetaminophen (PERCOCET) 10-325 MG per tablet, morphine (MS CONTIN) 30 MG 12 hr tablet  2. Reflux  omeprazole (PRILOSEC) 20 MG capsule  3. Insomnia  traZODone (DESYREL) 50 MG tablet  4. Pancreatitis  POCT CBC, Comprehensive metabolic panel, Amylase, Lipase   Will call Dr. Elnoria Howard and ask for his advice regarding imaging study.  Labs pending as above. Dr. Elnoria Howard suggested that we await his labs prior to doing a CT if we suspect a pancreatitis flare, but  did recommend CT as the imaging study of choice if he continues to have symptoms.  Refilled his MS contin and percocet, and did give him  150 percocet instead of 140 this month.  Explained that I am not comfortable putting him back on a benzo as he is on large amounts of narcotics.  We will increase his trazadone to 100 mg at bedtime and see if this is more helpful.    He has had a cholecystectomy per history- however per CT in 2012 he still has his gallbladder but has had an appendectomy- called radiologist to confirm these findings. Indeed, he does have his gallbladder still.    Abbe Amsterdam, MD  Called [facility] regarding his labs at 6pm- they are not back yet.  Called [place] back once I discovered that he does actually have his gallbladder.  This raises the possibility of acute cholecystis, and I recommended that he proceed to the ED for evaluation.  However, at this time he chose not to go to the ED.  Explained that acute cholecystitis can be serious, and that it gets more dangerous the longer it is left untreated.  However, as he still declined ED evaluation we planned for me to contact him when his lab results come in.  He did agree to go to the ED if he gets worse

## 2012-12-28 NOTE — Patient Instructions (Addendum)
I will call Dr. Elnoria Howard and be back in touch with you.  I have also ordered labs to look at your liver and pancreas.  We are going to increase your trazadone to 100 mg at bedtime to see if it will be helpful.  If you have increased vomiting or cannot keep down liquids or foods please come back in.

## 2012-12-29 ENCOUNTER — Telehealth: Payer: Self-pay | Admitting: Radiology

## 2012-12-29 ENCOUNTER — Telehealth: Payer: Self-pay

## 2012-12-29 DIAGNOSIS — R109 Unspecified abdominal pain: Secondary | ICD-10-CM

## 2012-12-29 NOTE — Telephone Encounter (Signed)
Called patient to advise Dr Patsy Lager wants him to have Abdominal US, he was here yesterday. She is concerned he may have acute cholecystitis. I have ordered scan. I called and left message for the patient to call me back. After I speak with him I will call and see if we can get the [place], if he has not had anything to eat yet.

## 2012-12-29 NOTE — Telephone Encounter (Signed)
Have called several times today and have left messages.  At call at 1:47 pm I again urged him to please call us back or come in to clinic.  Explained that we are trying to get an ultrasound done to look at his gallbladder.  However, at this point with bad weather coming it might be best to present to the ED for evaluation and treatment.  Explained that I am concerned he has cholecystitis which needs urgent treatment.

## 2012-12-29 NOTE — Telephone Encounter (Signed)
Patient is not responding to multiple phone calls from Dr Patsy Lager and myself. I have called [facility] police dispatch and they will have an officer check on the patient and have patient call me or they will call me to advise. Jerry Knight

## 2012-12-29 NOTE — Telephone Encounter (Signed)
Wants to let Jerry Knight that he received her message and will follow her instructions. To tell her thank you for her concern and immensely appreciated to have police come and check on him. he's glad there people that care out there.

## 2013-01-01 ENCOUNTER — Other Ambulatory Visit: Payer: Medicare Other

## 2013-01-01 ENCOUNTER — Ambulatory Visit
Admission: RE | Admit: 2013-01-01 | Discharge: 2013-01-01 | Disposition: A | Discharge status: Home / Self Care | Payer: Medicare Other | Source: Ambulatory Visit | Attending: Family Medicine | Admitting: Family Medicine

## 2013-01-01 DIAGNOSIS — R109 Unspecified abdominal pain: Secondary | ICD-10-CM

## 2013-01-17 ENCOUNTER — Telehealth: Payer: Self-pay

## 2013-01-17 NOTE — Telephone Encounter (Signed)
PT NORMALLY SEES DR COPLAND, RECEIVED A 2 CALLS YESTERDAY FROM A PRIVATE NUMBER, DID NOT KNOW IF IT WAS HER CALLING TO CHECK ON HIM, HE WANTS HER TO KNOW HE IS OK.Marland KitchenMarland Kitchen

## 2013-01-19 ENCOUNTER — Ambulatory Visit: Payer: Medicare Other

## 2013-01-19 NOTE — Telephone Encounter (Signed)
Called patient to advise. Did not get answer.

## 2013-01-19 NOTE — Telephone Encounter (Signed)
Patient is here now for a visit.

## 2013-01-19 NOTE — Telephone Encounter (Signed)
Please give him a call back- I did not call him, but am glad to hear that he is doing better.  He does need to come in for follow- up please!

## 2013-01-20 ENCOUNTER — Ambulatory Visit (INDEPENDENT_AMBULATORY_CARE_PROVIDER_SITE_OTHER): Payer: Medicare Other | Admitting: Family Medicine

## 2013-01-20 ENCOUNTER — Observation Stay (HOSPITAL_COMMUNITY)
Admission: AD | Admit: 2013-01-20 | Discharge: 2013-01-21 | Disposition: A | Discharge status: Home / Self Care | Payer: Medicare Other | Source: Ambulatory Visit | Attending: Family Medicine | Admitting: Family Medicine

## 2013-01-20 VITALS — BP 123/84 | HR 93 | Temp 97.8°F | Resp 18 | Ht 67.0 in | Wt 102.0 lb

## 2013-01-20 DIAGNOSIS — F17200 Nicotine dependence, unspecified, uncomplicated: Secondary | ICD-10-CM | POA: Insufficient documentation

## 2013-01-20 DIAGNOSIS — R1013 Epigastric pain: Principal | ICD-10-CM | POA: Insufficient documentation

## 2013-01-20 DIAGNOSIS — IMO0002 Reserved for concepts with insufficient information to code with codable children: Secondary | ICD-10-CM

## 2013-01-20 DIAGNOSIS — F1310 Sedative, hypnotic or anxiolytic abuse, uncomplicated: Secondary | ICD-10-CM | POA: Insufficient documentation

## 2013-01-20 DIAGNOSIS — F1910 Other psychoactive substance abuse, uncomplicated: Secondary | ICD-10-CM | POA: Diagnosis present

## 2013-01-20 DIAGNOSIS — R627 Adult failure to thrive: Secondary | ICD-10-CM | POA: Insufficient documentation

## 2013-01-20 DIAGNOSIS — R1084 Generalized abdominal pain: Secondary | ICD-10-CM

## 2013-01-20 DIAGNOSIS — R634 Abnormal weight loss: Secondary | ICD-10-CM | POA: Insufficient documentation

## 2013-01-20 DIAGNOSIS — Z681 Body mass index (BMI) 19 or less, adult: Secondary | ICD-10-CM | POA: Insufficient documentation

## 2013-01-20 DIAGNOSIS — R64 Cachexia: Secondary | ICD-10-CM | POA: Insufficient documentation

## 2013-01-20 DIAGNOSIS — K861 Other chronic pancreatitis: Secondary | ICD-10-CM | POA: Insufficient documentation

## 2013-01-20 DIAGNOSIS — Z79899 Other long term (current) drug therapy: Secondary | ICD-10-CM | POA: Insufficient documentation

## 2013-01-20 DIAGNOSIS — F1410 Cocaine abuse, uncomplicated: Secondary | ICD-10-CM | POA: Insufficient documentation

## 2013-01-20 DIAGNOSIS — F1110 Opioid abuse, uncomplicated: Secondary | ICD-10-CM | POA: Insufficient documentation

## 2013-01-20 DIAGNOSIS — F1210 Cannabis abuse, uncomplicated: Secondary | ICD-10-CM | POA: Insufficient documentation

## 2013-01-20 DIAGNOSIS — E164 Increased secretion of gastrin: Secondary | ICD-10-CM | POA: Insufficient documentation

## 2013-01-20 DIAGNOSIS — G8929 Other chronic pain: Secondary | ICD-10-CM

## 2013-01-20 DIAGNOSIS — E876 Hypokalemia: Secondary | ICD-10-CM | POA: Diagnosis present

## 2013-01-20 LAB — CBC
HCT: 34.3 % — ABNORMAL LOW (ref 39.0–52.0)
MCHC: 34.4 g/dL (ref 30.0–36.0)
RDW: 13.5 % (ref 11.5–15.5)

## 2013-01-20 LAB — COMPREHENSIVE METABOLIC PANEL
ALT: 6 U/L (ref 0–53)
AST: 11 U/L (ref 0–37)
Albumin: 3.2 g/dL — ABNORMAL LOW (ref 3.5–5.2)
CO2: 29 mEq/L (ref 19–32)
Calcium: 9.1 mg/dL (ref 8.4–10.5)
Chloride: 99 mEq/L (ref 96–112)
GFR calc non Af Amer: >90 mL/min (ref >90)
Sodium: 139 mEq/L (ref 135–145)
Total Bilirubin: 0.3 mg/dL (ref 0.3–1.2)

## 2013-01-20 LAB — POCT UA - MICROSCOPIC ONLY
Casts, Ur, LPF, POC: NEGATIVE
Yeast, UA: NEGATIVE

## 2013-01-20 LAB — MAGNESIUM: Magnesium: 1.8 mg/dL (ref 1.5–2.5)

## 2013-01-20 LAB — POCT URINALYSIS DIPSTICK
Glucose, UA: NEGATIVE
Nitrite, UA: NEGATIVE
Urobilinogen, UA: 0.2
pH, UA: 5.5

## 2013-01-20 LAB — POCT CBC
Granulocyte percent: 67.2 %G (ref 37–80)
HCT, POC: 42.1 % — AB (ref 43.5–53.7)
Hemoglobin: 13.4 g/dL — AB (ref 14.1–18.1)
MCV: 90.7 fL (ref 80–97)
POC LYMPH PERCENT: 26.4 %L (ref 10–50)
RBC: 4.64 M/uL — AB (ref 4.69–6.13)

## 2013-01-20 LAB — CREATININE, SERUM: GFR calc non Af Amer: >90 mL/min (ref >90)

## 2013-01-20 LAB — LIPASE, BLOOD: Lipase: 20 U/L (ref 11–59)

## 2013-01-20 LAB — ETHANOL: Alcohol, Ethyl (B): <11 mg/dL (ref 0–11)

## 2013-01-20 LAB — GLUCOSE, POCT (MANUAL RESULT ENTRY): POC Glucose: 93 mg/dl (ref 70–99)

## 2013-01-20 MED ORDER — IOHEXOL 300 MG/ML  SOLN: 100.0000 mL | Freq: Once | INTRAMUSCULAR | Status: DC | PRN

## 2013-01-20 MED ORDER — VITAMIN B-1 100 MG PO TABS
100.0000 mg | ORAL_TABLET | Freq: Every day | ORAL | Status: DC
  Administered 2013-01-20 – 2013-01-21 (×2): 100 mg via ORAL
  Filled 2013-01-20 (×2): qty 1

## 2013-01-20 MED ORDER — MORPHINE SULFATE ER 30 MG PO TBCR
30.0000 mg | EXTENDED_RELEASE_TABLET | Freq: Two times a day (BID) | ORAL | Status: DC
  Administered 2013-01-20 – 2013-01-21 (×2): 30 mg via ORAL
  Filled 2013-01-20 (×2): qty 1

## 2013-01-20 MED ORDER — MORPHINE SULFATE 2 MG/ML IJ SOLN
2.0000 mg | INTRAMUSCULAR | Status: DC | PRN
  Administered 2013-01-20 – 2013-01-21 (×6): 2 mg via INTRAVENOUS
  Filled 2013-01-20 (×6): qty 1

## 2013-01-20 MED ORDER — PREDNISONE 50 MG PO TABS
50.0000 mg | ORAL_TABLET | Freq: Once | ORAL | Status: AC
  Administered 2013-01-21: 50 mg via ORAL
  Filled 2013-01-20 (×2): qty 1

## 2013-01-20 MED ORDER — PREDNISONE 50 MG PO TABS
50.0000 mg | ORAL_TABLET | Freq: Once | ORAL | Status: AC
  Administered 2013-01-21: 50 mg via ORAL
  Filled 2013-01-20: qty 1

## 2013-01-20 MED ORDER — PANTOPRAZOLE SODIUM 40 MG PO TBEC
40.0000 mg | DELAYED_RELEASE_TABLET | Freq: Every day | ORAL | Status: DC
  Administered 2013-01-20 – 2013-01-21 (×2): 40 mg via ORAL
  Filled 2013-01-20 (×2): qty 1

## 2013-01-20 MED ORDER — ALBUTEROL SULFATE HFA 108 (90 BASE) MCG/ACT IN AERS
1.0000 | INHALATION_SPRAY | RESPIRATORY_TRACT | Status: DC | PRN
  Filled 2013-01-20: qty 6.7

## 2013-01-20 MED ORDER — THIAMINE HCL 100 MG/ML IJ SOLN
100.0000 mg | Freq: Every day | INTRAMUSCULAR | Status: DC
  Filled 2013-01-20 (×2): qty 1

## 2013-01-20 MED ORDER — TRAZODONE HCL 100 MG PO TABS
100.0000 mg | ORAL_TABLET | Freq: Every day | ORAL | Status: DC
  Administered 2013-01-20: 100 mg via ORAL
  Filled 2013-01-20 (×2): qty 1

## 2013-01-20 MED ORDER — DIPHENHYDRAMINE HCL 50 MG/ML IJ SOLN
50.0000 mg | Freq: Once | INTRAMUSCULAR | Status: AC
  Administered 2013-01-21: 50 mg via INTRAVENOUS
  Filled 2013-01-20: qty 1

## 2013-01-20 MED ORDER — FOLIC ACID 1 MG PO TABS
1.0000 mg | ORAL_TABLET | Freq: Every day | ORAL | Status: DC
  Administered 2013-01-20 – 2013-01-21 (×2): 1 mg via ORAL
  Filled 2013-01-20 (×2): qty 1

## 2013-01-20 MED ORDER — ADULT MULTIVITAMIN W/MINERALS CH
1.0000 | ORAL_TABLET | Freq: Every day | ORAL | Status: DC
  Administered 2013-01-20 – 2013-01-21 (×2): 1 via ORAL
  Filled 2013-01-20 (×2): qty 1

## 2013-01-20 MED ORDER — LORAZEPAM 1 MG PO TABS: 1.0000 mg | ORAL_TABLET | Freq: Four times a day (QID) | ORAL | Status: DC | PRN

## 2013-01-20 MED ORDER — LORAZEPAM 2 MG/ML IJ SOLN: 1.0000 mg | Freq: Four times a day (QID) | INTRAMUSCULAR | Status: DC | PRN

## 2013-01-20 MED ORDER — HEPARIN SODIUM (PORCINE) 5000 UNIT/ML IJ SOLN
5000.0000 [IU] | Freq: Three times a day (TID) | INTRAMUSCULAR | Status: DC
  Administered 2013-01-20 – 2013-01-21 (×3): 5000 [IU] via SUBCUTANEOUS
  Filled 2013-01-20 (×5): qty 1

## 2013-01-20 MED ORDER — DEXTROSE-NACL 5-0.45 % IV SOLN
INTRAVENOUS | Status: DC
  Administered 2013-01-20 – 2013-01-21 (×2): via INTRAVENOUS

## 2013-01-20 MED ORDER — POTASSIUM CHLORIDE CRYS ER 20 MEQ PO TBCR
40.0000 meq | EXTENDED_RELEASE_TABLET | Freq: Once | ORAL | Status: AC
  Administered 2013-01-20: 40 meq via ORAL
  Filled 2013-01-20 (×2): qty 2

## 2013-01-20 MED ORDER — IOHEXOL 300 MG/ML  SOLN: 25.0000 mL | INTRAMUSCULAR | Status: DC

## 2013-01-20 NOTE — Patient Instructions (Addendum)
Go to admitting at [facility]- you will be admitted to the family practice inpatient service.  We hope that you are feeling better soon

## 2013-01-20 NOTE — H&P (Signed)
Family Medicine Teaching [facility] Admission History and Physical Service Pager: [phone]  Patient name: Jerry Knight Medical record number: [id number] Date of birth: [birth year] Age: 52 y.o. Gender: male  Primary Care Provider: [name], MD  Chief Complaint: Right flank pain   Assessment and Plan: Jerry Knight is a 52 y.o. year old male presenting with abdominal pain uncontrolled and weight loss with history of chronic pancreatitis.  1. Abdominal Pain and nausea with Chronic Pancreatitis and Chronic Pain -  Hx chronic pancreatitis with Bilroth II procedure 1. Chronic Pain requiring MS Contin 30 mg BID and Percocet PRN.  Will continue MS Contin and give Morphine for PRN breakthrough pain 2. CBC WNL today, will repeat in AM.  Will get CMP, Lipase and UDS today.  UA done outside lab and normal, UCx pending.  Previous workup about 1 month ago for cholecystitis which was non revealing 3. With chronic weight loss and abdominal pain and chronic pancreatitis, will get abdominal/Pelvis CT with contrast to evaluate for further pathology 4. Protonix for Reflux.   2. Weight loss and failure to thrive- Pt lost twenty pounds in the last month and has no appetite.  Has evidence of cachexia including temporal wasting, interosseous/thenar atrophy.  1. Concern for malignancy, especially with smoking history of 30+ years and marijuana use daily.   2. 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  This was done in 2011 and recommended follow up.  Will repeat CT of chest along with abdomen and pelvis to look for any GI pathology that may be contributing to his pain.  3. Consult to Dietician for further recs 4. Check HIV and hepatitis panel 3. Tobacco/Substance Abuse - Will get CSW for tobacco and substance abuse including cocaine and Marijuana.  Place on CIWA . 4. FEN/GI: D5 1/2 NS @ 100 cc/hr.  Protonix  5. Prophylaxis: SQ heparin 6. Disposition: Pending further w/u 7. Code  Status: DNR  History of Present Illness: Jerry Knight is a 52 y.o. year old male with significant PMHx of chronic pancreatitis, Bilroth II procedure, Zollinger-Ellison syndrome, and chronic pain presenting with uncontrolled pain, decreased PO intake and acute weight loss of 20 lbs.  Pt states he has chronic pain and is on MS contin 30 mg BID and Percocet QID for his pain secondary to multiple abdominal surgeries.  He has had a Bilroth II and intestinal repair, appendectomy, hernia repair and has had chronic pancreatitis from what he says is an H Pylori infection he acquired when he was in [place].  Today, pt presented to his clinic c/o acute right sided flank pain that began about 4 days ago and has caused him to have severe nausea and decreased PO intake. He has had some vomiting that does not have blood in it and does not seem to have bile in it.  He smokes marijuana before every meal to help with eating and has recently done cocaine to help deal with his pain.  He denies fever, swelling in his leg, CP, SOB, hematemesis or hematochezia.  Pt is passing gas and has had bowel movement in the past day, watery due to his dumping syndrome.   Also, he does not have dysuria, hematuria, increased frequency or urgency.    Pt sees Dr. Elnoria Howard for GI procedures and had both endoscopy and colonoscopy within the last year.  Does not   Pt had w/u for cholecystitis about one month ago.  He had pain, vomiting,  and RUQ pain that was worked up in the end of January.  Pt had elevated WBC and had a normal CMP/Amylase/Lipase along with no evidence of acute cholecystitis.   Pt does admit to smoking 2 ppd for >20 yrs.  He also smokes marijuana and cocaine but denies alcohol use within the last 20 yrs.  Social Situation at home, recently separated from wife and lives with friend, Jerry Knight, who is his Medical POA.      Patient Active Problem List  Diagnosis  . Chest pain  . SOB (shortness of breath)  . Pancreatitis,  chronic  . Abdominal pain, epigastric   Past Medical History: Past Medical History  Diagnosis Date  . Chest pain   . Chronic pancreatitis   . 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 Migraine   . Seizure disorder     x 7 years  . Zollinger-Ellison syndrome   . GI bleed   . Chronic pain   . Migraine headache   . Chronic neck pain   . Pain management contract agreement   . Depression   . Seizures    Past Surgical History: Past Surgical History  Procedure Laterality Date  . Partial gastrectomy    . Eye surgery    . Hernia repair    . Small intestine surgery    . Appendectomy      per CT 2012- s/p appendectomy   Social History: History  Substance Use Topics  . Smoking status: Current Every Day Smoker -- 2.00 packs/day    Types: Cigarettes    Last Attempt to Quit: 07/29/2000  . Smokeless tobacco: Not on file     Comment: about 2 packs a day  . Alcohol Use: No   For any additional social history documentation, please refer to relevant sections of EMR.  Family History: Family History  Problem Relation Age of Onset  . Angina Mother   . Nephrolithiasis Father    Allergies: Allergies  Allergen Reactions  . Sonata (Zaleplon)     "black out'  confused   No current facility-administered medications on file prior to encounter.   Current Outpatient Prescriptions on File Prior to Encounter  Medication Sig Dispense Refill  . albuterol (PROAIR HFA) 108 (90 BASE) MCG/ACT inhaler Inhale 3 puffs into the lungs daily as needed for shortness of breath.  8.5 g  0  . morphine (MS CONTIN) 30 MG 12 hr tablet Take 1 tablet (30 mg total) by mouth 2 (two) times daily.  60 tablet  0  . omeprazole (PRILOSEC) 20 MG capsule Take 1 capsule (20 mg total) by mouth daily.  30 capsule  11  . oxyCODONE-acetaminophen (PERCOCET) 10-325 MG per tablet Take 1-2 tablets by mouth 3 (three) times daily as needed. Take 2 tablets in morning, 1 tablet in afternoon, and 2 tablets in the evening  150  tablet  0  . traZODone (DESYREL) 50 MG tablet Take 1 tablet (50 mg total) by mouth at bedtime.  30 tablet  11  . traZODone (DESYREL) 50 MG tablet Take 2 tablets (100 mg total) by mouth at bedtime.  60 tablet  5   Review Of Systems: Per HPI with the following additions: None Otherwise 12 point review of systems was performed and was unremarkable.  Physical Exam: BP 105/78  Pulse 92  Temp(Src) 97.9 F (36.6 C) (Oral)  Resp 18  Ht 5\' 7"  (1.702 m)  Wt 101 lb 13.6 oz (46.2 kg)  BMI  15.95 kg/m2  SpO2 99% Exam: GEN:NAD, Non-toxic, A & O x 3, thin, temporal wasting, appears chronically ill  HEENT: Atraumatic, Normocephalic. Neck supple. No masses, No LAD.   CV: RRR, No M/G/R. No JVD PULM: CTA B, no wheezes, crackles, rhonchi. No retractions. No resp. distress. No accessory muscle use.  ABD: S, thin, ND, +BS. No rebound. No HSM. right flank tenderness, No TTP otherwise  EXTR: No c/c/e  NEURO Normal gait.  PSYCH: Anxious appearing   Labs and Imaging: CBC BMET   Recent Labs Lab 01/20/13 1403  WBC 10.9*  HGB 13.4*  HCT 42.1*   No results found for this basename: NA, K, CL, CO2, BUN, CREATININE, GLUCOSE, CALCIUM,  in the last 168 hours   Gildardo Cranker, DO 01/20/2013, 6:35 PM   PGY-3 Addendum  Patient seen and evaluated along with Dr. Paulina Fusi.   I agree with the above exam findings, assessment and plan outlined by Dr. Paulina Fusi with any changes in Red

## 2013-01-20 NOTE — Progress Notes (Signed)
[facility] [address] [place] [address] [phone]  Date:  01/20/2013   Name:  Jerry Knight   DOB:  11/13/1961   MRN:  [id number]  PCP:  [name], MD    Chief Complaint: right flank pain, Constipation and Emesis   History of Present Illness:  Jerry Knight is a 52 y.o. very pleasant male patient who presents with the following:  History of chronic pain/ chronic pancreatitis. He was last here on 1/27 with complaint of exacerbation of his pain, vomiting and RUQ pain.  He was noted to have an elevated WBC count, but we did not realize that his history of cholecystectomy was not accurate until later that evening when I reviewed an old CT scan.  I was then concerned about possible acute cholecystitis and called him with instructions to go to the ED.  He did not do so, but did go for an abdominal ultrasound on 1/31 which was normal.  He was asked to come in for a recheck right away-  However, Jerry Knight did not seek further care until today.  He had a normal CMP/ amylase/ lipase on 1/27 as well.   He is here today with complaint of uncontrolled pain, inability to eat and a desire to be admitted to the hospital  He states that his right flank pain has gotten worse.  Also states that he has pain under his ribs that is "unbearable."  He is eating just a few bites of scrambled eggs, nothing else will stay down.  He has been vomiting "everything".    He has lost some weight- he weighed 107 in December and 102 today.   He states that he has to smoke MJ in order to maintain any appetite.  He also states he is now doing cocaine in order "to get relief."  He last used cocaine yesterday.   He also states that his chronic pain medications "are not working at all anymore."  He states he has been just "lying down at home trying to deal with this pain" since he was last seen. He does not feel that he can manage at home and asks me to have him admitted to the hospital  Patient  Active Problem List  Diagnosis  . Chest pain  . SOB (shortness of breath)  . Pancreatitis, chronic  . Abdominal pain, epigastric    Past Medical History  Diagnosis Date  . Chest pain   . Chronic pancreatitis   . 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 Migraine   . Seizure disorder     x 7 years  . Zollinger-Ellison syndrome   . GI bleed   . Chronic pain   . Migraine headache   . Chronic neck pain   . Pain management contract agreement   . Depression   . Seizures     Past Surgical History  Procedure Laterality Date  . Partial gastrectomy    . Eye surgery    . Hernia repair    . Small intestine surgery    . Appendectomy      per CT 2012- s/p appendectomy    History  Substance Use Topics  . Smoking status: Current Every Day Smoker -- 2.00 packs/day    Types: Cigarettes    Last Attempt to Quit: 07/29/2000  . Smokeless tobacco: Not on file     Comment: about 2 packs a day  . Alcohol Use:  No    Family History  Problem Relation Age of Onset  . Angina Mother   . Nephrolithiasis Father     Allergies  Allergen Reactions  . Sonata ([place])     "black out'  confused    Medication list has been reviewed and updated.  Current Outpatient Prescriptions on File Prior to Visit  Medication Sig Dispense Refill  . albuterol (PROAIR HFA) 108 (90 BASE) MCG/ACT inhaler Inhale 3 puffs into the lungs daily as needed for shortness of breath.  8.5 g  0  . morphine (MS CONTIN) 30 MG 12 hr tablet Take 1 tablet (30 mg total) by mouth 2 (two) times daily.  60 tablet  0  . omeprazole (PRILOSEC) 20 MG capsule Take 1 capsule (20 mg total) by mouth daily.  30 capsule  11  . oxyCODONE-acetaminophen (PERCOCET) 10-325 MG per tablet Take 1-2 tablets by mouth 3 (three) times daily as needed. Take 2 tablets in morning, 1 tablet in afternoon, and 2 tablets in the evening  150 tablet  0  . traZODone (DESYREL) 50 MG tablet Take 2 tablets (100 mg total) by mouth at bedtime.  60 tablet  5  .  traZODone (DESYREL) 50 MG tablet Take 1 tablet (50 mg total) by mouth at bedtime.  30 tablet  11   No current facility-administered medications on file prior to visit.    Review of Systems:  As per HPI- otherwise negative.   Physical Examination: Filed Vitals:   01/20/13 1321  BP: 123/84  Pulse: 93  Temp: 97.8 F (36.6 C)  Resp: 18   Filed Vitals:   01/20/13 1321  Height: 5\' 7"  (1.702 m)  Weight: 102 lb (46.267 kg)   Body mass index is 15.97 kg/(m^2). Ideal Body Weight: Weight in (lb) to have BMI = 25: 159.3  GEN:NAD, Non-toxic, A & O x 3, thin, temporal wasting, appears chronically ill HEENT: Atraumatic, Normocephalic. Neck supple. No masses, No LAD. Ears and Nose: No external deformity. CV: RRR, No M/G/R. No JVD. No thrill. No extra heart sounds. PULM: CTA B, no wheezes, crackles, rhonchi. No retractions. No resp. distress. No accessory muscle use. ABD: S, thin, ND, +BS. No rebound. No HSM.  Diffuse mild tenderness, right flank tenderness EXTR: No c/c/e NEURO Normal gait.  PSYCH: Normally interactive. Conversant. Not depressed or anxious appearing.  Calm demeanor.   Results for orders placed in visit on 01/20/13  POCT CBC      Result Value Range   WBC 10.9 (*) 4.6 - 10.2 K/uL   Lymph, poc 2.9  0.6 - 3.4   POC LYMPH PERCENT 26.4  10 - 50 %L   MID (cbc) 0.7  0 - 0.9   POC MID % 6.4  0 - 12 %M   POC Granulocyte 7.3 (*) 2 - 6.9   Granulocyte percent 67.2  37 - 80 %G   RBC 4.64 (*) 4.69 - 6.13 M/uL   Hemoglobin 13.4 (*) 14.1 - 18.1 g/dL   HCT, POC 16.1 (*) 09.6 - 53.7 %   MCV 90.7  80 - 97 fL   MCH, POC 28.9  27 - 31.2 pg   MCHC 31.8  31.8 - 35.4 g/dL   RDW, POC 04.5     Platelet Count, POC 375  142 - 424 K/uL   MPV 10.0  0 - 99.8 fL  GLUCOSE, POCT (MANUAL RESULT ENTRY)      Result Value Range   POC Glucose 93  70 - 99  mg/dl  POCT URINALYSIS DIPSTICK      Result Value Range   Color, UA yellow     Clarity, UA clear     Glucose, UA neg     Bilirubin, UA neg      Ketones, UA neg     Spec Grav, UA 1.025     Blood, UA trace-lysed     pH, UA 5.5     Protein, UA trace     Urobilinogen, UA 0.2     Nitrite, UA neg     Leukocytes, UA Negative    POCT UA - MICROSCOPIC ONLY      Result Value Range   WBC, Ur, HPF, POC 0-2     RBC, urine, microscopic 1-3     Bacteria, U Microscopic 1+     Mucus, UA mod     Epithelial cells, urine per micros 0-2     Crystals, Ur, HPF, POC triple phosphate     Casts, Ur, LPF, POC neg     Yeast, UA neg      Assessment and Plan: Failure to thrive - Plan: POCT CBC, POCT glucose (manual entry)  Abdominal pain, chronic, generalized - Plan: POCT CBC, POCT urinalysis dipstick, POCT UA - Microscopic Only  Hester is here today with failure to thrive/ inability to eat and exacerbation of his chronic pain.  Discussed with Dr. Lula Olszewski, on call for family practice who kindly agreed to admit patient for hydration/ nutrition and pain management.  He also may benefit from a  Social work consultation regarding his drug use/ possible detox from narcotics I have also ordered a urine culture for him today  Abbe Amsterdam, MD

## 2013-01-21 ENCOUNTER — Observation Stay (HOSPITAL_COMMUNITY): Payer: Medicare Other

## 2013-01-21 ENCOUNTER — Encounter (HOSPITAL_COMMUNITY): Payer: Self-pay | Admitting: *Deleted

## 2013-01-21 DIAGNOSIS — R1013 Epigastric pain: Principal | ICD-10-CM

## 2013-01-21 DIAGNOSIS — K861 Other chronic pancreatitis: Secondary | ICD-10-CM

## 2013-01-21 LAB — CBC WITH DIFFERENTIAL/PLATELET
Basophils Absolute: 0 10*3/uL (ref 0.0–0.1)
Lymphocytes Relative: 37 % (ref 12–46)
Lymphs Abs: 2.2 10*3/uL (ref 0.7–4.0)
Neutro Abs: 3 10*3/uL (ref 1.7–7.7)
Neutrophils Relative %: 49 % (ref 43–77)
Platelets: 282 10*3/uL (ref 150–400)
RBC: 3.74 MIL/uL — ABNORMAL LOW (ref 4.22–5.81)
WBC: 6.1 10*3/uL (ref 4.0–10.5)

## 2013-01-21 LAB — COMPREHENSIVE METABOLIC PANEL
Albumin: 2.8 g/dL — ABNORMAL LOW (ref 3.5–5.2)
BUN: 12 mg/dL (ref 6–23)
CO2: 32 mEq/L (ref 19–32)
Chloride: 99 mEq/L (ref 96–112)
Creatinine, Ser: 0.81 mg/dL (ref 0.50–1.35)
GFR calc Af Amer: >90 mL/min (ref >90)
GFR calc non Af Amer: >90 mL/min (ref >90)
Glucose, Bld: 140 mg/dL — ABNORMAL HIGH (ref 70–99)
Total Bilirubin: 0.3 mg/dL (ref 0.3–1.2)

## 2013-01-21 LAB — HEPATITIS PANEL, ACUTE
HCV Ab: NEGATIVE
Hep B C IgM: NEGATIVE

## 2013-01-21 MED ORDER — IOHEXOL 300 MG/ML  SOLN
25.0000 mL | Freq: Once | INTRAMUSCULAR | Status: AC | PRN
  Administered 2013-01-21: 25 mL via ORAL

## 2013-01-21 MED ORDER — ENSURE PUDDING PO PUDG
1.0000 | Freq: Three times a day (TID) | ORAL | Status: DC
  Administered 2013-01-21 (×2): 1 via ORAL

## 2013-01-21 MED ORDER — PANTOPRAZOLE SODIUM 40 MG PO TBEC: 40.0000 mg | DELAYED_RELEASE_TABLET | Freq: Two times a day (BID) | ORAL | Status: DC

## 2013-01-21 MED ORDER — ENSURE PUDDING PO PUDG: 1.0000 | Freq: Three times a day (TID) | ORAL | Status: DC

## 2013-01-21 MED ORDER — POTASSIUM CHLORIDE CRYS ER 20 MEQ PO TBCR
40.0000 meq | EXTENDED_RELEASE_TABLET | Freq: Two times a day (BID) | ORAL | Status: DC
Start: 2013-01-21 — End: 2013-01-21
  Administered 2013-01-21: 40 meq via ORAL
  Filled 2013-01-21 (×2): qty 2

## 2013-01-21 MED ORDER — POLYETHYLENE GLYCOL 3350 17 G PO PACK
17.0000 g | PACK | Freq: Every day | ORAL | Status: DC
  Administered 2013-01-21: 17 g via ORAL
  Filled 2013-01-21: qty 1

## 2013-01-21 MED ORDER — IOHEXOL 300 MG/ML  SOLN
100.0000 mL | Freq: Once | INTRAMUSCULAR | Status: AC | PRN
  Administered 2013-01-21: 100 mL via INTRAVENOUS

## 2013-01-21 MED ORDER — POLYETHYLENE GLYCOL 3350 17 G PO PACK: 17.0000 g | PACK | Freq: Every day | ORAL | Status: DC

## 2013-01-21 NOTE — Progress Notes (Signed)
Clinical Social Worker received referral for "Current Substance Abuse".  CSW reviewed chart and attempted to meet with pt.  Pt currently prepping for a procedure.  CSW to continue to follow and assist as pt is available.   Angelia Mould, MSW, [place] [phone]

## 2013-01-21 NOTE — Progress Notes (Signed)
INITIAL NUTRITION ASSESSMENT  DOCUMENTATION CODES Per approved criteria  -Underweight   INTERVENTION: 1.  General healthful diet; encourage intake as able.  Supplements to be considered once medical condition better understood.    NUTRITION DIAGNOSIS: Inadequate oral intake related to abdominal pain as evidenced by pt with inconsistent intake, nausea.   Monitor:  1.  Food/Beverage; improvement in intake to >/=75% of meals 2.  Wt/wt change; deter further loss  Reason for Assessment: consult  52 y.o. male  Admitting Dx: abdominal pain  ASSESSMENT: Pt admitted with abdominal pain. Pt with h/o abdominal pain and chronic pancreatitis.  Pt dx with FTT due to 20 lbs wt loss in the past month with no appetite.   RD attempted to meet with pt, however pt curled up in bed with severe nausea, unable to engage in conversation.  Pt states RN aware of nausea.  Pt had eaten 100% of breakfast meal this am.  Per CSW note, pt admitted to marijuana use to slow digestion at prevent dumping.  RD notes that pt was able to consume 100% of lunch after visit this afternoon as well. Pt unable to participate in Nutrition Focused Physical Exam at time of visit.  RD to follow for ongoing assessment.   Suspect some level of malnutrition given wt hx, BMI, and cachexia noted by MD on exam.   Lipase     Component Value Date/Time   LIPASE 20 01/20/2013 1901      Height: Ht Readings from Last 1 Encounters:  01/20/13 5\' 7"  (1.702 m)    Weight: Wt Readings from Last 1 Encounters:  01/20/13 101 lb 13.6 oz (46.2 kg)    Ideal Body Weight: 67g  % Ideal Body Weight: 68%  Wt Readings from Last 10 Encounters:  01/20/13 101 lb 13.6 oz (46.2 kg)  01/20/13 102 lb (46.267 kg)  12/28/12 106 lb (48.081 kg)  11/27/12 107 lb 3.2 oz (48.626 kg)  10/28/12 110 lb 9.6 oz (50.168 kg)  09/25/12 110 lb (49.896 kg)  08/27/12 112 lb 12.8 oz (51.166 kg)  07/29/12 107 lb (48.535 kg)  06/26/12 103 lb (46.72 kg)  05/27/12  94 lb 12.8 oz (43.001 kg)    Usual Body Weight: highly variable, ranging from 95-112 lbs in recent months  % Usual Body Weight: 100%  BMI:  Body mass index is 15.95 kg/(m^2).  Estimated Nutritional Needs: Kcal: 1600-1840 Protein: 55-68g Fluid: >1.6 L/day  Skin: wnl  Diet Order: General  EDUCATION NEEDS: -No education needs identified at this time   Intake/Output Summary (Last 24 hours) at 01/21/13 1704 Last data filed at 01/21/13 1516  Gross per 24 hour  Intake   2440 ml  Output   1350 ml  Net   1090 ml    Last BM: 2/17  Labs:   Recent Labs Lab 01/20/13 1901 01/20/13 2001 01/20/13 2056 01/21/13 0550  NA 139  --   --  140  K 2.9*  --   --  3.0*  CL 99  --   --  99  CO2 29  --   --  32  BUN 16  --   --  12  CREATININE 0.68 0.66  --  0.81  CALCIUM 9.1  --   --  8.8  MG  --   --  1.8  --   GLUCOSE 98  --   --  140*    CBG (last 3)  No results found for this basename: GLUCAP,  in the last  72 hours  Scheduled Meds: . feeding supplement  1 Container Oral TID WC  . folic acid  1 mg Oral Daily  . heparin  5,000 Units Subcutaneous Q8H  . morphine  30 mg Oral BID  . multivitamin with minerals  1 tablet Oral Daily  . pantoprazole  40 mg Oral Daily  . polyethylene glycol  17 g Oral Daily  . potassium chloride  40 mEq Oral BID  . thiamine  100 mg Oral Daily  . traZODone  100 mg Oral QHS    Continuous Infusions: . dextrose 5 % and 0.45% NaCl 100 mL/hr at 01/21/13 1610    Past Medical History  Diagnosis Date  . Chest pain   . Chronic pancreatitis   . 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 Migraine   . Seizure disorder     x 7 years  . Zollinger-Ellison syndrome   . GI bleed   . Chronic pain   . Migraine headache   . Chronic neck pain   . Pain management contract agreement   . Depression   . Seizures     Past Surgical History  Procedure Laterality Date  . Partial gastrectomy    . Eye surgery    . Hernia repair    . Small intestine  surgery    . Appendectomy      per CT 2012- s/p appendectomy    Loyce Dys, MS RD LDN Clinical Inpatient Dietitian Pager: [phone] Weekend/After hours pager: [phone]

## 2013-01-21 NOTE — Progress Notes (Signed)
FMTS Attending Daily Note:  Jeff Andrian Sabala MD  [phone] pager  Family Practice pager:  [phone] I have seen and examined this patient and have reviewed their chart. I have discussed this patient with the resident. I agree with the resident's findings, assessment and care plan.  Please see my separate note for details.  

## 2013-01-21 NOTE — Clinical Social Work Psychosocial (Signed)
     Clinical Social Work Department BRIEF PSYCHOSOCIAL ASSESSMENT 01/21/2013  Patient:  Jerry Knight, Jerry Knight     Account Number:  [id number]     Admit date:  01/20/2013  Clinical Social Worker:  Margaree Mackintosh  Date/Time:  01/21/2013 04:17 PM  Referred by:  Physician  Date Referred:  01/21/2013 Referred for  Substance Abuse  Substance Abuse   Other Referral:   Interview type:  Patient Other interview type:    PSYCHOSOCIAL DATA Living Status:  FRIEND(S) Admitted from facility:   Level of care:   Primary support name:  None Provided. Primary support relationship to patient:   Degree of support available:   [place].    CURRENT CONCERNS Current Concerns  Substance Abuse   Other Concerns:    SOCIAL WORK ASSESSMENT / PLAN Clinical Social Worker received referral for current substance abuse.  CSW met with pt at bedside.  CSW introduced self, explained role, and provided support.  CSW provided active listening.  Pt appeared to share openly. Pt admits to cocaine and marijuana use and states his current plan is to speak with "Brett Canales" at the [facility].  Pt cites he last used Cocaine approximately "15 years" ago. And reason for current use was "pain control".  Pt feels cocaine did not adequately control pain nor does he have the financial resources to continue use and plans to cease. Pt states use of marijuana is to "slow digestion" to prevent "dumping syndrome".   Pt was able to identify several people in his support network he is able to go to for help and assistance.  Notably, his ex wife.  Pt able to list several goals and alternate coping skills such as walking in woods, making walking sticks, and listed "sailing to and back from [place]" as a goal.  Pt also able to list several resources in the community to assist with his needs; Health and safety inspector and [employer].  CSW validated pt's feelings of frustration related to medical diagnoses.  Pt appeared receptive to CSW  intervention.  CSW encouraged pt to follow up with [facility] and [facility].  CSW to sign off, please reconsult if needed.   Assessment/plan status:  Information/Referral to [facility] Other assessment/ plan:   Information/referral to community resources:   [facility]  [facility]  List of Outpatient Substance Abuse centers.    PATIENTS/FAMILYS RESPONSE TO PLAN OF CARE: Pt appeared to share openly and was receptive to intervention.  Pt thanked CSW for intervention.

## 2013-01-21 NOTE — Progress Notes (Signed)
Patient discharged to home with instructions. 

## 2013-01-21 NOTE — Discharge Summary (Signed)
Physician Discharge Summary  Patient ID: Jerry Knight MRN: [id number] DOB: [birth year] Age: 52 y.o.  Admit date: 01/20/2013 Discharge date: 01/21/2013 Admitting Physician: Tobey Grim, MD  PCP: [name], MD  Consultants:none     Discharge Diagnosis: Principal Problem:   Abdominal pain, epigastric Active Problems:   Loss of weight   Hypokalemia   Substance abuse    Hospital Course Jerry Knight is a 52 y.o. year old male presenting with uncontrolled abdominal pain and weight loss with history of chronic pancreatitis and zollinger ellison syndrome.   1. Abdominal Pain and nausea with Chronic Pancreatitis, Chronic Pain, and zollinger ellison syndrome: Hx chronic pancreatitis with Bilroth II procedure. Patient presenting with abdominal pain and right sided flank pain for 4 day in addition to nausea and decreased PO intake. Endorsed a 20 lb weight loss over the last month. Patient was continued on home MS contin and given morphine prn for breath through pain. His lipase, WBC and LFTs were all wnl. CT abd/pelvis revealed no acute abnormalities and is resulted below. He was started on protonix and increased dose to 40 mg BID given history of zollinger ellison syndrome.  2. Weight loss and failure to thrive: Pt lost twenty pounds in the last month and has no appetite. Has evidence of cachexia including temporal wasting. There was concern for malignancy given smoking history and previous history of lung nodules on CT scan. CT chest obtained that revealed stable lung nodules. Weight loss possibly due to decreased gut with bilroth II procedure.  3. Tobacco/Substance Abuse: Given past history was placed on CIWA protocol and required no treatment. UDS positive for opiates, cocaine, benzos, and THC.   4. Hypokalemia: 3 on BMET morning of discharge. Supplemented with KCl 40 mEq x2 doses  Problem List 1. Abdominal pain, chronic 2. Weight loss 3. Substance abuse 4. HYpokalemia     Discharge PE   Filed Vitals:   01/21/13 1322  BP: 139/77  Pulse: 72  Temp: 97.8 F (36.6 C)  Resp: 18   General: no acute distress, cachectic male laying in bed  Cardiovascular: rrr, no mrg appreciated  Respiratory: CTAB  Abdomen: soft, thin, TTP mostly over epigastrium and RUQ and LUQ, has guarding present  Extremities: thin extremities, no edema    Procedures/Imaging:  Ct Chest W Contrast  01/21/2013   IMPRESSION: Minimal atelectasis at the left base.  No other acute abnormality. Multiple stable tiny pulmonary nodules, unchanged since 09/21/2010 and all felt to be benign.  No further follow-up is suggested.  Distention of the esophagus is most likely due to a markedly distended stomach as described on the CT scan of the abdomen on this same date.   Original Report Authenticated By: [name], M.D.    Ct Abdomen Pelvis W Contrast  01/21/2013 IMPRESSION: No acute abnormality of the abdomen or pelvis.   Original Report Authenticated By: [name], M.D.     Labs  CBC  Recent Labs Lab 01/20/13 1403 01/20/13 2001 01/21/13 0550  WBC 10.9* 8.8 6.1  HGB 13.4* 11.8* 11.3*  HCT 42.1* 34.3* 33.1*  PLT  --  302 282   BMET  Recent Labs Lab 01/20/13 1901 01/20/13 2001 01/21/13 0550  NA 139  --  140  K 2.9*  --  3.0*  CL 99  --  99  CO2 29  --  32  BUN 16  --  12  CREATININE 0.68 0.66 0.81  CALCIUM 9.1  --  8.8  PROT  6.7  --  6.0  BILITOT 0.3  --  0.3  ALKPHOS 80  --  72  ALT 6  --  6  AST 11  --  11  GLUCOSE 98  --  140*   Results for orders placed during the hospital encounter of 01/20/13 (from the past 72 hour(s))  LIPASE, BLOOD     Status: None   Collection Time    01/20/13  7:01 PM      Result Value Range   Lipase 20  11 - 59 U/L  COMPREHENSIVE METABOLIC PANEL     Status: Abnormal   Collection Time    01/20/13  7:01 PM      Result Value Range   Sodium 139  135 - 145 mEq/L   Potassium 2.9 (*) 3.5 - 5.1 mEq/L   Chloride 99  96 - 112 mEq/L   CO2 29   19 - 32 mEq/L   Glucose, Bld 98  70 - 99 mg/dL   BUN 16  6 - 23 mg/dL   Creatinine, Ser 1.61  0.50 - 1.35 mg/dL   Calcium 9.1  8.4 - 09.6 mg/dL   Total Protein 6.7  6.0 - 8.3 g/dL   Albumin 3.2 (*) 3.5 - 5.2 g/dL   AST 11  0 - 37 U/L   ALT 6  0 - 53 U/L   Alkaline Phosphatase 80  39 - 117 U/L   Total Bilirubin 0.3  0.3 - 1.2 mg/dL   GFR calc non Af Amer >90  >90 mL/min   GFR calc Af Amer >90  >90 mL/min   Comment:            The eGFR has been calculated     using the CKD EPI equation.     This calculation has not been     validated in all clinical     situations.     eGFR's persistently     <90 mL/min signify     possible Chronic Kidney Disease.  CBC     Status: Abnormal   Collection Time    01/20/13  8:01 PM      Result Value Range   WBC 8.8  4.0 - 10.5 K/uL   RBC 3.96 (*) 4.22 - 5.81 MIL/uL   Hemoglobin 11.8 (*) 13.0 - 17.0 g/dL   HCT 04.5 (*) 40.9 - 81.1 %   MCV 86.6  78.0 - 100.0 fL   MCH 29.8  26.0 - 34.0 pg   MCHC 34.4  30.0 - 36.0 g/dL   RDW 91.4  78.2 - 95.6 %   Platelets 302  150 - 400 K/uL  CREATININE, SERUM     Status: None   Collection Time    01/20/13  8:01 PM      Result Value Range   Creatinine, Ser 0.66  0.50 - 1.35 mg/dL   GFR calc non Af Amer >90  >90 mL/min   GFR calc Af Amer >90  >90 mL/min   Comment:            The eGFR has been calculated     using the CKD EPI equation.     This calculation has not been     validated in all clinical     situations.     eGFR's persistently     <90 mL/min signify     possible Chronic Kidney Disease.  ETHANOL     Status: None   Collection Time  01/20/13  8:01 PM      Result Value Range   Alcohol, Ethyl (B) <11  0 - 11 mg/dL   Comment:            LOWEST DETECTABLE LIMIT FOR     SERUM ALCOHOL IS 11 mg/dL     FOR MEDICAL PURPOSES ONLY  MAGNESIUM     Status: None   Collection Time    01/20/13  8:56 PM      Result Value Range   Magnesium 1.8  1.5 - 2.5 mg/dL  HIV ANTIBODY (ROUTINE TESTING)     Status:  None   Collection Time    01/20/13 10:02 PM      Result Value Range   HIV NON REACTIVE  NON REACTIVE  HEPATITIS PANEL, ACUTE     Status: None   Collection Time    01/20/13 10:02 PM      Result Value Range   Hepatitis B Surface Ag NEGATIVE  NEGATIVE   HCV Ab NEGATIVE  NEGATIVE   Hep A IgM PENDING  NEGATIVE   Hep B C IgM PENDING  NEGATIVE  COMPREHENSIVE METABOLIC PANEL     Status: Abnormal   Collection Time    01/21/13  5:50 AM      Result Value Range   Sodium 140  135 - 145 mEq/L   Potassium 3.0 (*) 3.5 - 5.1 mEq/L   Chloride 99  96 - 112 mEq/L   CO2 32  19 - 32 mEq/L   Glucose, Bld 140 (*) 70 - 99 mg/dL   BUN 12  6 - 23 mg/dL   Creatinine, Ser 1.61  0.50 - 1.35 mg/dL   Calcium 8.8  8.4 - 09.6 mg/dL   Total Protein 6.0  6.0 - 8.3 g/dL   Albumin 2.8 (*) 3.5 - 5.2 g/dL   AST 11  0 - 37 U/L   ALT 6  0 - 53 U/L   Alkaline Phosphatase 72  39 - 117 U/L   Total Bilirubin 0.3  0.3 - 1.2 mg/dL   GFR calc non Af Amer >90  >90 mL/min   GFR calc Af Amer >90  >90 mL/min   Comment:            The eGFR has been calculated     using the CKD EPI equation.     This calculation has not been     validated in all clinical     situations.     eGFR's persistently     <90 mL/min signify     possible Chronic Kidney Disease.  CBC WITH DIFFERENTIAL     Status: Abnormal   Collection Time    01/21/13  5:50 AM      Result Value Range   WBC 6.1  4.0 - 10.5 K/uL   RBC 3.74 (*) 4.22 - 5.81 MIL/uL   Hemoglobin 11.3 (*) 13.0 - 17.0 g/dL   HCT 04.5 (*) 40.9 - 81.1 %   MCV 88.5  78.0 - 100.0 fL   MCH 30.2  26.0 - 34.0 pg   MCHC 34.1  30.0 - 36.0 g/dL   RDW 91.4  78.2 - 95.6 %   Platelets 282  150 - 400 K/uL   Neutrophils Relative 49  43 - 77 %   Neutro Abs 3.0  1.7 - 7.7 K/uL   Lymphocytes Relative 37  12 - 46 %   Lymphs Abs 2.2  0.7 - 4.0 K/uL   Monocytes Relative 12  3 - 12 %   Monocytes Absolute 0.7  0.1 - 1.0 K/uL   Eosinophils Relative 2  0 - 5 %   Eosinophils Absolute 0.1  0.0 - 0.7  K/uL   Basophils Relative 1  0 - 1 %   Basophils Absolute 0.0  0.0 - 0.1 K/uL  URINE RAPID DRUG SCREEN (HOSP PERFORMED)     Status: Abnormal   Collection Time    01/21/13  6:09 AM      Result Value Range   Opiates POSITIVE (*) NONE DETECTED   Cocaine POSITIVE (*) NONE DETECTED   Benzodiazepines POSITIVE (*) NONE DETECTED   Amphetamines NONE DETECTED  NONE DETECTED   Tetrahydrocannabinol POSITIVE (*) NONE DETECTED   Barbiturates NONE DETECTED  NONE DETECTED   Comment:            DRUG SCREEN FOR MEDICAL PURPOSES     ONLY.  IF CONFIRMATION IS NEEDED     FOR ANY PURPOSE, NOTIFY LAB     WITHIN 5 DAYS.                LOWEST DETECTABLE LIMITS     FOR URINE DRUG SCREEN     Drug Class       Cutoff (ng/mL)     Amphetamine      1000     Barbiturate      200     Benzodiazepine   200     Tricyclics       300     Opiates          300     Cocaine          300     THC              50       Patient condition at time of discharge/disposition: stable  Disposition-home   Follow up issues: 1. Substance abuse counseling 2. Weight loss-would likely benefit from nutritional supplements and counseling 3. Pain management appears difficult to manage in this patient, may benefit from pain clinic referral  Discharge follow up:  Follow-up Information   Follow up with DAUB, STEVE A, MD. (Please call to schedule an appointment for as soon as possible)    Contact information:   [address] [place] [address] [phone]      Discharge Orders   Future Orders Complete By Expires     Call MD for:  persistant dizziness or light-headedness  As directed     Call MD for:  persistant nausea and vomiting  As directed     Call MD for:  severe uncontrolled pain  As directed     Diet - low sodium heart healthy  As directed     Increase activity slowly  As directed         Discharge Instructions: Please refer to Patient Instructions section of EMR for full details.  Patient was counseled  important signs and symptoms that should prompt return to medical care, changes in medications, dietary instructions, activity restrictions, and follow up appointments.    Discharge Medications   Medication List    STOP taking these medications       omeprazole 20 MG capsule  Commonly known as:  PRILOSEC  Replaced by:  pantoprazole 40 MG tablet      TAKE these medications       albuterol 108 (90 BASE) MCG/ACT inhaler  Commonly known as:  PROAIR HFA  Inhale 3 puffs into the  lungs daily as needed for shortness of breath.     morphine 30 MG 12 hr tablet  Commonly known as:  MS CONTIN  Take 1 tablet (30 mg total) by mouth 2 (two) times daily.     oxyCODONE-acetaminophen 10-325 MG per tablet  Commonly known as:  PERCOCET  Take 1-2 tablets by mouth 3 (three) times daily as needed. Take 2 tablets in morning, 1 tablet in afternoon, and 2 tablets in the evening     pantoprazole 40 MG tablet  Commonly known as:  PROTONIX  Take 1 tablet (40 mg total) by mouth 2 (two) times daily.     polyethylene glycol packet  Commonly known as:  MIRALAX / GLYCOLAX  Take 17 g by mouth daily.     traZODone 50 MG tablet  Commonly known as:  DESYREL  Take 2 tablets (100 mg total) by mouth at bedtime.        Marikay Alar, MD of [facility] 01/22/2013 2:58 PM

## 2013-01-21 NOTE — Progress Notes (Signed)
Family Medicine Teaching Service Daily Progress Note Service Page: [phone]  Subjective: feeling better this morning. Now has minimal nausea. No vomiting. Improved abdominal pain. Was able to eat full meal this morning for the first time in several days.  Objective: Temp:  [97.4 F (36.3 C)-97.9 F (36.6 C)] 97.4 F (36.3 C) (02/20 0650) Pulse Rate:  [64-93] 65 (02/20 0650) Resp:  [18] 18 (02/20 0650) BP: (103-129)/(56-84) 103/56 mmHg (02/20 0650) SpO2:  [98 %-100 %] 100 % (02/20 0650) Weight:  [101 lb 13.6 oz (46.2 kg)-102 lb (46.267 kg)] 101 lb 13.6 oz (46.2 kg) (02/19 1812) Exam: General: no acute distress, cachectic male laying in bed Cardiovascular: rrr, no mrg appreciated Respiratory: CTAB Abdomen: soft, thin, TTP mostly over epigastrium and RUQ and LUQ, has guarding present Extremities: thin extremities, no edema  CBC BMET   Recent Labs Lab 01/20/13 1403 01/20/13 2001 01/21/13 0550  WBC 10.9* 8.8 6.1  HGB 13.4* 11.8* 11.3*  HCT 42.1* 34.3* 33.1*  PLT  --  302 282    Recent Labs Lab 01/20/13 1901 01/20/13 2001 01/21/13 0550  NA 139  --  140  K 2.9*  --  3.0*  CL 99  --  99  CO2 29  --  32  BUN 16  --  12  CREATININE 0.68 0.66 0.81  GLUCOSE 98  --  140*  CALCIUM 9.1  --  8.8     AST 11 ALT 6 ALk 72 Lipase 20 UDS positive for opiates, cocaine, benzos, and THC HIV pending Hep panel pending  Imaging/Diagnostic Tests: CT abd/pelvis-to be done today CT chest-to be done today  Assessment/Plan: Jerry Knight is a 52 y.o. year old male presenting with uncontrolled abdominal pain and weight loss with history of chronic pancreatitis and zollinger ellison syndrome.   1. Abdominal Pain and nausea with Chronic Pancreatitis, Chronic Pain, and zollinger ellison syndrome: Hx chronic pancreatitis with Bilroth II procedure  -Chronic Pain requiring MS Contin 30 mg BID and Percocet PRN. Will continue MS Contin and give Morphine for PRN breakthrough pain -lipase  normal, WBC normal, LFTs normal-not giving good indication for cause of pain -will f/u CT abd/pelvis for further pathology -Protonix for Reflux-given zollinger ellison will need to consider increasing dose to 40 BID -follows with Dr. Elnoria Howard GI as an outpatient-may need c/s pending imaging results  2. Weight loss and failure to thrive: Pt lost twenty pounds in the last month and has no appetite. Has evidence of cachexia including temporal wasting  -Concern for malignancy, especially with smoking history of 30+ years and marijuana use daily.  -Previous CT with nodules in both lungs that had been stable in 2011.  -f/u CT chest, CT abd/pelvis  -Consult to Dietician for further recs -f/u HIV and hepatitis panel  3. Tobacco/Substance Abuse: CIWA score of 0. -SW c/s for substance abuse with positive UDS -on CIWA protocol  4. Hypokalemia: 3 on BMET this AM -supplemented with KCl 40 mEq x2 doses  FEN/GI: D5 1/2 NS @ 100 cc/hr. Protonix  Prophylaxis: SQ heparin Disposition: Pending further w/u Code Status: DNR  Marikay Alar, MD 01/21/2013, 8:05 AM

## 2013-01-21 NOTE — H&P (Signed)
FMTS Attending Admission Note: Jerry Don MD Personal pager:  [phone] FPTS Service Pager:  [phone]  I  have seen and examined this patient, reviewed their chart. I have discussed this patient with the resident. I agree with the resident's findings, assessment and care plan.  Additionally: - Unclear etiology of abdominal pain.  He has several reasons to have such pain.   - Examination today relatively benign, he was nontender for me (distracted while talking) for all but the deepest palpation.  No guarding or rebound, bowel sounds good throughout. CT chest/abd/pelvis around planned for today. - will fu with imaging studies.  Nothing on prior labs that would help [place] pinpoint specific etiologies   -

## 2013-01-22 ENCOUNTER — Encounter: Payer: Self-pay | Admitting: Family Medicine

## 2013-01-22 DIAGNOSIS — E876 Hypokalemia: Secondary | ICD-10-CM | POA: Diagnosis present

## 2013-01-22 DIAGNOSIS — R634 Abnormal weight loss: Secondary | ICD-10-CM | POA: Diagnosis present

## 2013-01-22 DIAGNOSIS — F1910 Other psychoactive substance abuse, uncomplicated: Secondary | ICD-10-CM | POA: Diagnosis present

## 2013-01-22 LAB — URINE CULTURE: Colony Count: 8000

## 2013-01-23 NOTE — Discharge Summary (Signed)
Family Medicine Teaching Service  Discharge Note : Attending Jeff Eliette Drumwright MD Pager [phone] Inpatient Team Pager:  [phone]  I have reviewed this patient and the patient's chart and have discussed discharge planning with the resident at the time of discharge. I agree with the discharge plan as above.    

## 2013-01-27 ENCOUNTER — Ambulatory Visit (INDEPENDENT_AMBULATORY_CARE_PROVIDER_SITE_OTHER): Payer: Medicare Other | Admitting: Family Medicine

## 2013-01-27 ENCOUNTER — Ambulatory Visit: Payer: Medicare Other

## 2013-01-27 VITALS — BP 114/77 | HR 88 | Temp 98.2°F | Resp 18 | Ht 65.0 in | Wt 105.0 lb

## 2013-01-27 DIAGNOSIS — D649 Anemia, unspecified: Secondary | ICD-10-CM

## 2013-01-27 DIAGNOSIS — F1910 Other psychoactive substance abuse, uncomplicated: Secondary | ICD-10-CM

## 2013-01-27 DIAGNOSIS — E876 Hypokalemia: Secondary | ICD-10-CM

## 2013-01-27 DIAGNOSIS — R3129 Other microscopic hematuria: Secondary | ICD-10-CM

## 2013-01-27 DIAGNOSIS — G8929 Other chronic pain: Secondary | ICD-10-CM

## 2013-01-27 DIAGNOSIS — D72829 Elevated white blood cell count, unspecified: Secondary | ICD-10-CM

## 2013-01-27 DIAGNOSIS — K861 Other chronic pancreatitis: Secondary | ICD-10-CM

## 2013-01-27 LAB — POCT CBC
Hemoglobin: 13.7 g/dL — AB (ref 14.1–18.1)
Lymph, poc: 3 (ref 0.6–3.4)
MCH, POC: 28.2 pg (ref 27–31.2)
MCHC: 30.6 g/dL — AB (ref 31.8–35.4)
MCV: 92.1 fL (ref 80–97)
MID (cbc): 1 — AB (ref 0–0.9)
MPV: 10.9 fL (ref 0–99.8)
POC MID %: 6.3 %M (ref 0–12)
RBC: 4.86 M/uL (ref 4.69–6.13)
WBC: 16 10*3/uL — AB (ref 4.6–10.2)

## 2013-01-27 LAB — POCT UA - MICROSCOPIC ONLY
Bacteria, U Microscopic: NEGATIVE
Casts, Ur, LPF, POC: NEGATIVE
Crystals, Ur, HPF, POC: NEGATIVE
Epithelial cells, urine per micros: NEGATIVE
Mucus, UA: NEGATIVE
Yeast, UA: NEGATIVE

## 2013-01-27 LAB — POCT SEDIMENTATION RATE: POCT SED RATE: 25 mm/hr — AB (ref 0–22)

## 2013-01-27 LAB — LIPASE: Lipase: 18 U/L (ref 0–75)

## 2013-01-27 LAB — POCT URINALYSIS DIPSTICK
Ketones, UA: NEGATIVE
Protein, UA: NEGATIVE
Spec Grav, UA: 1.02
Urobilinogen, UA: 0.2
pH, UA: 6.5

## 2013-01-27 LAB — COMPREHENSIVE METABOLIC PANEL
Alkaline Phosphatase: 76 U/L (ref 39–117)
BUN: 24 mg/dL — ABNORMAL HIGH (ref 6–23)
CO2: 29 mEq/L (ref 19–32)
Creat: 0.77 mg/dL (ref 0.50–1.35)
Glucose, Bld: 84 mg/dL (ref 70–99)
Sodium: 137 mEq/L (ref 135–145)
Total Bilirubin: 0.3 mg/dL (ref 0.3–1.2)
Total Protein: 6.9 g/dL (ref 6.0–8.3)

## 2013-01-27 LAB — AMYLASE: Amylase: 47 U/L (ref 0–105)

## 2013-01-27 MED ORDER — MORPHINE SULFATE ER 30 MG PO TBCR: 30.0000 mg | EXTENDED_RELEASE_TABLET | Freq: Two times a day (BID) | ORAL | Status: DC

## 2013-01-27 MED ORDER — OXYCODONE-ACETAMINOPHEN 10-325 MG PO TABS: 1.0000 | ORAL_TABLET | Freq: Three times a day (TID) | ORAL | Status: DC | PRN

## 2013-01-27 NOTE — Patient Instructions (Addendum)
You have a follow up appt with Dr Elnoria Howard scheduled for today at 2:30 pm.  Please see him as there may be a more acute problem with your pancreatitis or possibly diverticulitis.    As we discussed, we are going to refer you to pain management to continue managing your chronic pain medications.  We have given you a week's supply of medication today and will need to see you and do a drug screen at the office in one week.    I am going to look through your paper chart and review your old urology notes.  We will probably need to have you go see your urologist again as you have blood in your urine.

## 2013-01-27 NOTE — Progress Notes (Addendum)
[facility] [address], [place] [address] [phone]  Date:  01/27/2013   Name:  Jerry Knight   DOB:  21-Aug-1961   MRN:  [id number]  PCP:  [name], MD    Chief Complaint: Follow-up   History of Present Illness:  Jerry Knight is a 52 y.o. very pleasant male patient who presents with the following:  Here today to follow- up from a recent hospitalization.  He was seen here on 2/19 and admitted for FTT.  He was kept overnight and discharged.  He has chronic pancreatitis, history of ZES, and a history of Bilroth II procedure.  He had normal pancreatic function tests, normal LFTs and normal CT abdomen and pelvis.  He did have mild hypokalemia which was treated at the hospital.   He is here today for a recheck.  He feels much better and is eating better.  He attributes some of his recovery to ensure pudding which he had in the hospital- he would like to have an rx for these if possible.    However, he then states that he continues to have pain and that 2 days ago he "was contemplating going to the hospital" due to severe pain.   He continues to complain of pain in his right lower back and lower abdomen which is worse at night, better with eating, and which he describes as a "twisting" feeling.  This has been going on for some time- is better than prior to his hospitalization.    Recent negative urine culture.  Noted to have mild microhematuria at his last visit so will recheck this today.  He does have a history of significant cigarette smoking  He states that he has not used cocaine since prior to his hospitalization.  He is continuing to see his counselor at the [facility] and is attending meetings for his alcoholism.    He was positive for benzodiazepnes, opiates, cocaine and THC at his hospital drug screen.  He states that he uses MJ regularly and has done so for a long time to help his chronic GI symptoms. He states that he had one ativan from an Rx  that was left over "from the [place]."  He states that he was given the cocaine from a friend, denies selling or trading his opioids for cocaine.  Of note he was seen without a co- pay at his last visit as he could not afford his co- pay.    He last saw a urologist about one year ago- he does have a history of a renal cyst.  Patient Active Problem List  Diagnosis  . Chest pain  . SOB (shortness of breath)  . Pancreatitis, chronic  . Abdominal pain, epigastric  . Loss of weight  . Hypokalemia  . Substance abuse    Past Medical History  Diagnosis Date  . Chest pain   . Chronic pancreatitis   . 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 Migraine   . Seizure disorder     x 7 years  . Zollinger-Ellison syndrome   . GI bleed   . Chronic pain   . Migraine headache   . Chronic neck pain   . Pain management contract agreement   . Depression   . Seizures     Past Surgical History  Procedure Laterality Date  . Partial gastrectomy    . Eye surgery    . Hernia repair    .  Small intestine surgery    . Appendectomy      per CT 2012- s/p appendectomy    History  Substance Use Topics  . Smoking status: Current Every Day Smoker -- 2.00 packs/day    Types: Cigarettes    Last Attempt to Quit: 07/29/2000  . Smokeless tobacco: Not on file     Comment: about 2 packs a day  . Alcohol Use: No    Family History  Problem Relation Age of Onset  . Angina Mother   . Nephrolithiasis Father     Allergies  Allergen Reactions  . Iohexol     Pt claims he has chest tightness and sob after iv contrast injections.  Kathaleen Bury ([place])     "black out'  confused    Medication list has been reviewed and updated.  Current Outpatient Prescriptions on File Prior to Visit  Medication Sig Dispense Refill  . albuterol (PROAIR HFA) 108 (90 BASE) MCG/ACT inhaler Inhale 3 puffs into the lungs daily as needed for shortness of breath.  8.5 g  0  . morphine (MS CONTIN) 30 MG 12 hr tablet Take 1 tablet  (30 mg total) by mouth 2 (two) times daily.  60 tablet  0  . oxyCODONE-acetaminophen (PERCOCET) 10-325 MG per tablet Take 1-2 tablets by mouth 3 (three) times daily as needed. Take 2 tablets in morning, 1 tablet in afternoon, and 2 tablets in the evening  150 tablet  0  . pantoprazole (PROTONIX) 40 MG tablet Take 1 tablet (40 mg total) by mouth 2 (two) times daily.  60 tablet  1  . polyethylene glycol (MIRALAX / GLYCOLAX) packet Take 17 g by mouth daily.  14 each  2  . traZODone (DESYREL) 50 MG tablet Take 2 tablets (100 mg total) by mouth at bedtime.  60 tablet  5   No current facility-administered medications on file prior to visit.    Review of Systems:  As per HPI- otherwise negative.   Physical Examination: Filed Vitals:   01/27/13 0923  BP: 114/77  Pulse: 88  Temp: 98.2 F (36.8 C)  Resp: 18   Filed Vitals:   01/27/13 0923  Height: 5\' 5"  (1.651 m)  Weight: 105 lb (47.628 kg)   Body mass index is 17.47 kg/(m^2). Ideal Body Weight: Weight in (lb) to have BMI = 25: 149.9  GEN: WDWN, NAD, Non-toxic, A & O x 3, thin, temporal wasting HEENT: Atraumatic, Normocephalic. Neck supple. No masses, No LAD. Ears and Nose: No external deformity. CV: RRR, No M/G/R. No JVD. No thrill. No extra heart sounds. PULM: CTA B, no wheezes, crackles, rhonchi. No retractions. No resp. distress. No accessory muscle use. ABD: S, ND, +BS. No rebound. No HSM.  He continues to have pain in the lower abdomen, more in the RLQ.  EXTR: No c/c/e NEURO Normal gait.  PSYCH: Normally interactive. Conversant. Not depressed or anxious appearing.  Calm demeanor.   Results for orders placed in visit on 01/27/13  POCT CBC      Result Value Range   WBC 16.0 (*) 4.6 - 10.2 K/uL   Lymph, poc 3.0  0.6 - 3.4   POC LYMPH PERCENT 18.7  10 - 50 %L   MID (cbc) 1.0 (*) 0 - 0.9   POC MID % 6.3  0 - 12 %M   POC Granulocyte 12.0 (*) 2 - 6.9   Granulocyte percent 75.0  37 - 80 %G   RBC 4.86  4.69 - 6.13 M/uL  Hemoglobin 13.7 (*) 14.1 - 18.1 g/dL   HCT, POC 47.8  29.5 - 53.7 %   MCV 92.1  80 - 97 fL   MCH, POC 28.2  27 - 31.2 pg   MCHC 30.6 (*) 31.8 - 35.4 g/dL   RDW, POC 62.1     Platelet Count, POC 349  142 - 424 K/uL   MPV 10.9  0 - 99.8 fL  POCT UA - MICROSCOPIC ONLY      Result Value Range   WBC, Ur, HPF, POC NEG     RBC, urine, microscopic 0-2     Bacteria, U Microscopic NEG     Mucus, UA NEG     Epithelial cells, urine per micros NEG     Crystals, Ur, HPF, POC NEG     Casts, Ur, LPF, POC NEG     Yeast, UA NEG     Amorphous, UA POS    POCT URINALYSIS DIPSTICK      Result Value Range   Color, UA YELLOW     Clarity, UA SL.CLOUDY     Glucose, UA NEG     Bilirubin, UA NEG     Ketones, UA NEG     Spec Grav, UA 1.020     Blood, UA TRACE INTACT     pH, UA 6.5     Protein, UA NEG     Urobilinogen, UA 0.2     Nitrite, UA NEG     Leukocytes, UA Negative    POCT SEDIMENTATION RATE      Result Value Range   POCT SED RATE 25 (*) 0 - 22 mm/hr    [facility] reading (PRIMARY) by  Dr. Patsy Lager. CXR: negative CHEST - 2 VIEW  Comparison: 01/21/2013  Findings: Cardiomediastinal silhouette is unremarkable. Minimal hyperinflation. No acute infiltrate or pleural effusion. No pulmonary edema. Surgical clips are noted in GE junction region.  IMPRESSION: No active disease.  Of note, Duayne does have a gallbladder but according to CT he does not appear to have an appendix- see CT 06/2011  IMPRESSION:  Pancreas is grossly unremarkable. No peripancreatic fluid  collections.  5.2 cm mildly complex right upper pole cyst.  Prior gastric surgery with gastrojejunostomy. Appendectomy.  Distal colonic resection. No evidence of obstruction.  6 mm subpleural right lower lobe nodule. 5 mm left lower lobe  nodule. Both are stable from 09/21/2010. If this patient is at  high risk for primary bronchogenic carcinoma, one additional follow-  up chest CT 18-24 months from initial October 2011 CT is suggested   per Fleischner Society guidelines.  Assessment and Plan: Hypokalemia - Plan: Basic metabolic panel, Comprehensive metabolic panel  Anemia - Plan: POCT CBC  Microhematuria - Plan: POCT UA - Microscopic Only, POCT urinalysis dipstick, Ambulatory referral to Urology  Substance abuse  Chronic pancreatitis - Plan: Comprehensive metabolic panel, Lipase, Amylase, POCT SEDIMENTATION RATE  Leukocytosis, unspecified - Plan: DG Chest 2 View  Chronic pain - Plan: oxyCODONE-acetaminophen (PERCOCET) 10-325 MG per tablet, morphine (MS CONTIN) 30 MG 12 hr tablet, Ambulatory referral to Pain Clinic   Jerry Knight is here today with chronic abdominal pain, recenelty admitted for FTT and discharged after one night.  He is here with pain that is similar to his baseline and somewhat confounded by social factors/ substance abuse and chronic pain medication use.  However, he does have an increased wbc count today which is concerning.   He should not have appendicitis as he is s/p appendectomy per CT (although he  was not aware of this history)- consider diverticulitis.  Dr. Elnoria Howard will see Jerry Knight today and we appreciate his consultation.    Also gave written rx for ensure puddings, one TID, #90 with 11 RF- he will plan to discuss this with his home health nurse to see how he may go about getting these   Referral to urology for evaluation of microhematuria- his smoking history places him at risk for bladder cancer Jerry Schlottman, MD  Narcotic rx last written here on 12/28/2012.   Will give a one week supply today He takes percocet up to 5 a day prn, MS contin BID He will come in for a drug screen/ rx in one week and we will refer him to pain management.  Let him know that we are sorry but we cannot continue to manage his pain medications chronically as he has been abusing illegal drugs.

## 2013-01-28 ENCOUNTER — Encounter: Payer: Self-pay | Admitting: Family Medicine

## 2013-01-28 ENCOUNTER — Telehealth: Payer: Self-pay | Admitting: Family Medicine

## 2013-01-28 NOTE — Telephone Encounter (Signed)
Called Dr. Haywood Pao office to get an update.  Jerry Knight did not appear yesterday, then called this am stating that he needed to be seen urgently and that he had slept through his appt yesterday.  However, they are not able to see him until Monday- he does have an appt for Monday afternoon.     Called to check on him- he is doing ok, but counseled him to come back to [facility] or the ER if he needs help in the meantime.

## 2013-08-11 ENCOUNTER — Emergency Department (HOSPITAL_COMMUNITY): Payer: Medicare Other

## 2013-08-11 ENCOUNTER — Encounter (HOSPITAL_COMMUNITY): Payer: Self-pay | Admitting: *Deleted

## 2013-08-11 ENCOUNTER — Emergency Department (HOSPITAL_COMMUNITY)
Admission: EM | Admit: 2013-08-11 | Discharge: 2013-08-11 | Disposition: A | Discharge status: Home / Self Care | Payer: Medicare Other | Attending: Emergency Medicine | Admitting: Emergency Medicine

## 2013-08-11 DIAGNOSIS — R5381 Other malaise: Secondary | ICD-10-CM | POA: Insufficient documentation

## 2013-08-11 DIAGNOSIS — M542 Cervicalgia: Secondary | ICD-10-CM | POA: Insufficient documentation

## 2013-08-11 DIAGNOSIS — R109 Unspecified abdominal pain: Secondary | ICD-10-CM | POA: Insufficient documentation

## 2013-08-11 DIAGNOSIS — R51 Headache: Secondary | ICD-10-CM | POA: Insufficient documentation

## 2013-08-11 DIAGNOSIS — M549 Dorsalgia, unspecified: Secondary | ICD-10-CM | POA: Insufficient documentation

## 2013-08-11 DIAGNOSIS — M5030 Other cervical disc degeneration, unspecified cervical region: Secondary | ICD-10-CM | POA: Insufficient documentation

## 2013-08-11 DIAGNOSIS — R319 Hematuria, unspecified: Secondary | ICD-10-CM | POA: Insufficient documentation

## 2013-08-11 DIAGNOSIS — R55 Syncope and collapse: Secondary | ICD-10-CM | POA: Insufficient documentation

## 2013-08-11 DIAGNOSIS — E164 Increased secretion of gastrin: Secondary | ICD-10-CM | POA: Insufficient documentation

## 2013-08-11 DIAGNOSIS — F329 Major depressive disorder, single episode, unspecified: Secondary | ICD-10-CM | POA: Insufficient documentation

## 2013-08-11 DIAGNOSIS — K861 Other chronic pancreatitis: Secondary | ICD-10-CM

## 2013-08-11 DIAGNOSIS — R0789 Other chest pain: Secondary | ICD-10-CM | POA: Insufficient documentation

## 2013-08-11 DIAGNOSIS — F17200 Nicotine dependence, unspecified, uncomplicated: Secondary | ICD-10-CM | POA: Insufficient documentation

## 2013-08-11 DIAGNOSIS — G8929 Other chronic pain: Secondary | ICD-10-CM | POA: Insufficient documentation

## 2013-08-11 DIAGNOSIS — R112 Nausea with vomiting, unspecified: Secondary | ICD-10-CM | POA: Insufficient documentation

## 2013-08-11 DIAGNOSIS — F3289 Other specified depressive episodes: Secondary | ICD-10-CM | POA: Insufficient documentation

## 2013-08-11 LAB — COMPREHENSIVE METABOLIC PANEL
ALT: 9 U/L (ref 0–53)
Calcium: 9.6 mg/dL (ref 8.4–10.5)
Creatinine, Ser: 0.6 mg/dL (ref 0.50–1.35)
GFR calc Af Amer: >90 mL/min (ref >90)
Glucose, Bld: 105 mg/dL — ABNORMAL HIGH (ref 70–99)
Sodium: 129 mEq/L — ABNORMAL LOW (ref 135–145)
Total Protein: 7.6 g/dL (ref 6.0–8.3)

## 2013-08-11 LAB — CBC WITH DIFFERENTIAL/PLATELET
Basophils Absolute: 0 10*3/uL (ref 0.0–0.1)
Eosinophils Absolute: 0.1 10*3/uL (ref 0.0–0.7)
Eosinophils Relative: 1 % (ref 0–5)
Lymphs Abs: 2.3 10*3/uL (ref 0.7–4.0)
MCH: 29.7 pg (ref 26.0–34.0)
MCV: 87.3 fL (ref 78.0–100.0)
Platelets: 237 10*3/uL (ref 150–400)
RDW: 13.9 % (ref 11.5–15.5)

## 2013-08-11 LAB — URINALYSIS, ROUTINE W REFLEX MICROSCOPIC
Glucose, UA: NEGATIVE mg/dL
Leukocytes, UA: NEGATIVE
Nitrite: NEGATIVE
Protein, ur: NEGATIVE mg/dL
Urobilinogen, UA: 1 mg/dL (ref 0.0–1.0)

## 2013-08-11 LAB — RAPID URINE DRUG SCREEN, HOSP PERFORMED
Amphetamines: NOT DETECTED
Opiates: NOT DETECTED

## 2013-08-11 LAB — POCT I-STAT TROPONIN I: Troponin i, poc: 0 ng/mL (ref 0.00–0.08)

## 2013-08-11 LAB — LACTIC ACID, PLASMA: Lactic Acid, Venous: 1.8 mmol/L (ref 0.5–2.2)

## 2013-08-11 MED ORDER — SODIUM CHLORIDE 0.9 % IV BOLUS (SEPSIS)
1000.0000 mL | Freq: Once | INTRAVENOUS | Status: AC
  Administered 2013-08-11: 1000 mL via INTRAVENOUS

## 2013-08-11 MED ORDER — PROMETHAZINE HCL 25 MG PO TABS: 25.0000 mg | ORAL_TABLET | Freq: Four times a day (QID) | ORAL | Status: DC | PRN

## 2013-08-11 MED ORDER — METOCLOPRAMIDE HCL 5 MG/ML IJ SOLN
10.0000 mg | Freq: Once | INTRAMUSCULAR | Status: AC
  Administered 2013-08-11: 10 mg via INTRAVENOUS
  Filled 2013-08-11: qty 2

## 2013-08-11 MED ORDER — HYDROMORPHONE HCL PF 1 MG/ML IJ SOLN
1.0000 mg | Freq: Once | INTRAMUSCULAR | Status: AC
  Administered 2013-08-11: 1 mg via INTRAVENOUS
  Filled 2013-08-11: qty 1

## 2013-08-11 MED ORDER — OMEPRAZOLE 20 MG PO CPDR: 20.0000 mg | DELAYED_RELEASE_CAPSULE | Freq: Every day | ORAL | Status: DC

## 2013-08-11 MED ORDER — MORPHINE SULFATE 4 MG/ML IJ SOLN
4.0000 mg | Freq: Once | INTRAMUSCULAR | Status: AC
  Administered 2013-08-11: 4 mg via INTRAVENOUS
  Filled 2013-08-11: qty 1

## 2013-08-11 MED ORDER — ONDANSETRON HCL 4 MG/2ML IJ SOLN: 4.0000 mg | Freq: Once | INTRAMUSCULAR | Status: DC

## 2013-08-11 NOTE — ED Provider Notes (Signed)
CSN: [id number]     Arrival date & time 08/11/13  1500 History   First MD Initiated Contact with Patient 08/11/13 1510     Chief Complaint  Patient presents with  . Abdominal Pain   (Consider location/radiation/quality/duration/timing/severity/associated sxs/prior Treatment) The history is provided by the patient. No language interpreter was used.  Jerry Knight is a 52 y/o M with PMHx of chest pain, chronic pancreatitis, degenerative disc disease, migraine, seizures, GI bleed, Zollinger-Ellison syndrome, chronic pain with pain management contract presenting to the ED, brought in by EMS, with abdominal pain that has been ongoing since Thursday night. As per patient, patient has had abdominal pain since he was 52 years old, this acute exacerbation started on Thursday. Patient reported that the abdominal pain is localized to the suprapubic region, described as a sharp pain that is constant with mild radiation to the back. Patient reported that the pain gets better when he eats something and then worsens when he vomits the food. Patient reported that he is unable to keep anything down. Patient reported that he has been feeling nauseous since Friday and has had at least 3-4 episodes of emesis per day, reported that he has seen some bile - denied blood. Patient reported that he has been feeling weak, reported that he had two syncopal episodes in the bathroom today, patient unable to recall whether or not he hit his head, reported that when he woke up on the floor he felt light-headed. Patient reported that he has been using cocaine for at least 8 months to one year - patient reported that he started up the cocaine a week prior to being admitted back in February 2014 due to the pain. Patient reported that he uses cocaine daily at least 5-6 times per day. Patient reported "I don't want to do it anymore," but reported that he continues to use cocaine as an abdominal pain relief. Patient reported that he has been  experiencing chest pain, localized to the right side since Friday, described as sharp pain that occurs at rest, intermittently throughout the day. Patient reported that he had one episode of hematuria Wednesday/Thursday - patient cannot recall the exact day, reported that this episode occurred once. Patient denied alcohol, other illicit drugs. Denied using medications at home. Patient reported that his last bowel movement was Friday/Saturday at some point. Denied shortness of breath, diarrhea, melena, hematochezia, blurred vision, visual distortions, dysuria, dizziness. PCP dr. Lesle Chris   Past Medical History  Diagnosis Date  . Chest pain   . Chronic pancreatitis   . 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 Migraine   . Seizure disorder     x 7 years  . Zollinger-Ellison syndrome   . GI bleed   . Chronic pain   . Migraine headache   . Chronic neck pain   . Pain management contract agreement   . Depression   . Seizures    Past Surgical History  Procedure Laterality Date  . Partial gastrectomy    . Eye surgery    . Hernia repair    . Small intestine surgery    . Appendectomy      per CT 2012- s/p appendectomy   Family History  Problem Relation Age of Onset  . Angina Mother   . Nephrolithiasis Father    History  Substance Use Topics  . Smoking status: Current Every Day Smoker -- 2.00 packs/day    Types: Cigarettes    Last  Attempt to Quit: 07/29/2000  . Smokeless tobacco: Not on file     Comment: about 2 packs a day  . Alcohol Use: No    Review of Systems  Constitutional: Negative for fever and chills.  HENT: Positive for neck pain (chronic neck pain - baseline for patient).   Eyes: Negative for visual disturbance.  Respiratory: Negative for chest tightness and shortness of breath.   Cardiovascular: Positive for chest pain.  Gastrointestinal: Positive for nausea, vomiting and abdominal pain. Negative for diarrhea and constipation.  Genitourinary: Positive for  hematuria. Negative for decreased urine volume and difficulty urinating.  Musculoskeletal: Positive for back pain (chronic back pain - baseline for patient ).  Neurological: Positive for weakness and headaches. Negative for dizziness and numbness.  All other systems reviewed and are negative.    Allergies  Iohexol and Sonata  Home Medications   Current Outpatient Rx  Name  Route  Sig  Dispense  Refill  . ALPRAZolam (XANAX) 1 MG tablet   Oral   Take 1 mg by mouth once.         Marland Kitchen omeprazole (PRILOSEC) 20 MG capsule   Oral   Take 1 capsule (20 mg total) by mouth daily.   5 capsule   0   . promethazine (PHENERGAN) 25 MG tablet   Oral   Take 1 tablet (25 mg total) by mouth every 6 (six) hours as needed for nausea.   30 tablet   0    BP 143/75  Pulse 60  Temp(Src) 98.4 F (36.9 C) (Oral)  Resp 16  SpO2 100% Physical Exam  Nursing note and vitals reviewed. Constitutional: He is oriented to person, place, and time. He appears well-developed and well-nourished. No distress.  Patient found sleeping in room on his side with emesis back tucked underneath arm   HENT:  Head: Normocephalic and atraumatic.  Mouth/Throat: Oropharynx is clear and moist. No oropharyngeal exudate.  Eyes: Conjunctivae and EOM are normal. Pupils are equal, round, and reactive to light. Right eye exhibits no discharge. Left eye exhibits no discharge.  Neck: Normal range of motion. Neck supple.  Negative neck stiffness Negative nuchal rigidity Negative lymphadenopathy  Cardiovascular: Normal rate, regular rhythm and normal heart sounds.  Exam reveals no friction rub.   No murmur heard. Pulses:      Radial pulses are 2+ on the right side, and 2+ on the left side.       Dorsalis pedis pulses are 2+ on the right side, and 2+ on the left side.  Pulmonary/Chest: Effort normal and breath sounds normal. No respiratory distress. He has no wheezes. He has no rales. He exhibits tenderness.  Pain upon palpation  to the right side of the chest  Abdominal: Soft. Bowel sounds are normal. He exhibits no distension. There is tenderness in the suprapubic area and left lower quadrant. There is guarding. There is no rebound and no tenderness at McBurney's point.    Generalized tenderness, main discomfort noted upon palpation to the LLQ and suprapubic region   Lymphadenopathy:    He has no cervical adenopathy.  Neurological: He is alert and oriented to person, place, and time. He exhibits normal muscle tone. Coordination normal.  Skin: Skin is warm and dry. No rash noted. He is not diaphoretic. No erythema.  Psychiatric: He has a normal mood and affect. His behavior is normal. Thought content normal.    ED Course  Procedures (including critical care time)  This provider has reviewed this patient's  chart. Patient has been seen in the ED numerous times regarding ongoing abdominal pain. Patient reported that he has had a long history of abdominal pain since he was 52 years old. Patient's history is positive for partial gastrectomy, small intestine surgery, Roux-en-Y procedure, cholecystectomy performed in 12/2012, and appendectomy performed in 2012. Patient was recently seen in the ED in 01/2013 where a CT scan of the abdomen and pelvis with contrast was performed, negative findings. Patient was admitted to the hospital for chronic pancreatitis.   6:30 PM Discussed case and lab findings with Dr. Dorris Carnes. [place] - as per physician, cleared patient for discharge.   7:23 PM Re-assessed patient. Patient tolerated sandwich and fluids PO. Patient reported that he feels better, but the abdominal pain still lingers. Discussed with patient labs and imaging results in detail. Discussed plan for discharge with patient. Discussed with patient strict return instructions. Patient asked for pain medication prior to being discharge to hold him over today. Requested new referral for GI physician, patient reported that he does not like the  physician he saw in the past.    Date: 08/11/2013  Rate: 61  Rhythm: normal sinus rhythm  QRS Axis: normal  Intervals: normal  ST/T Wave abnormalities: nonspecific T wave changes  Conduction Disutrbances:none  Narrative Interpretation:   Old EKG Reviewed: unchanged   Labs Review Labs Reviewed  CBC WITH DIFFERENTIAL - Abnormal; Notable for the following:    WBC 14.5 (*)    Neutro Abs 10.9 (*)    Monocytes Absolute 1.2 (*)    All other components within normal limits  COMPREHENSIVE METABOLIC PANEL - Abnormal; Notable for the following:    Sodium 129 (*)    Chloride 90 (*)    Glucose, Bld 105 (*)    All other components within normal limits  URINALYSIS, ROUTINE W REFLEX MICROSCOPIC - Abnormal; Notable for the following:    APPearance CLOUDY (*)    All other components within normal limits  URINE RAPID DRUG SCREEN (HOSP PERFORMED) - Abnormal; Notable for the following:    Cocaine POSITIVE (*)    Tetrahydrocannabinol POSITIVE (*)    All other components within normal limits  LIPASE, BLOOD  LACTIC ACID, PLASMA  POCT I-STAT TROPONIN I   Imaging Review Ct Abdomen Pelvis Wo Contrast  08/11/2013   *RADIOLOGY REPORT*  Clinical Data: Severe abdominal pain, prior partial gastrectomy, small bowel surgery, and appendectomy  CT ABDOMEN AND PELVIS WITHOUT CONTRAST  Technique:  Multidetector CT imaging of the abdomen and pelvis was performed following the standard protocol without intravenous contrast.  Comparison: 01/21/2013  Findings: Stable right lower lobe peripheral nodular scarring. Lung bases otherwise clear.  Normal heart size.  No pericardial or pleural effusion.  Abdomen:  Postop changes at the GE junction and also of the stomach.  Negative for bowel obstruction, significant dilatation, or ileus.  No free air evident.  Liver, gallbladder, biliary system, pancreas, spleen, and adrenal glands are within normal limits for noncontrast imaging and demonstrate no acute process.  Kidneys  demonstrate no acute obstruction, perinephric inflammatory process, or obstructing ureteral calculus.  Nonobstructing punctate intrarenal calculi in the left kidney lower pole, image 31. Stable 2 cm right upper pole renal cyst and cortical scarring in the right kidney upper pole as well.  Atherosclerosis of the aorta.  Negative for aneurysm or retroperitoneal hemorrhage.  No abdominal free fluid, fluid collection, hemorrhage, abscess, adenopathy.  Prior appendectomy noted.  Pelvis:  No pelvic free fluid, fluid collection, hemorrhage, abscess, adenopathy, inguinal  abnormality, hernia.  Urinary bladder unremarkable.  No acute distal bowel process.  No acute osseous finding.  IMPRESSION: Stable postop findings in the abdomen and pelvis.  No acute intra-abdominal or pelvic process by noncontrast imaging.  Punctate nonobstructing left nephrolithiasis   Original Report Authenticated By: Judie Petit. Miles Costain, M.D.   Ct Head Wo Contrast  08/11/2013   *RADIOLOGY REPORT*  Clinical Data: Abdominal pain.  Headaches.  CT HEAD WITHOUT CONTRAST  Technique:  Contiguous axial images were obtained from the base of the skull through the vertex without contrast.  Comparison: None.  Findings: The brain has a normal appearance without evidence for hemorrhage, infarction, hydrocephalus, or mass lesion.  There is no extra axial fluid collection. There is opacification of the frontal sinus.  IMPRESSION:  1.  No acute intracranial abnormalities. 2.  Frontal sinus opacification.   Original Report Authenticated By: Signa Kell, M.D.    MDM   1. Abdominal pain   2. Chronic pancreatitis      Patient presenting to the ED with abdominal pain that has been ongoing since Thursday with associated nausea and emesis. Patient has a strong history of abdominal pain - partial gastrectomy, cholecystectomy in 12/2012, appendectomy 2012, small intestine surgery. Patient presenting with chest pain localized to the right side that has been ongoing  intermittently since Friday, described as a sharp pain. Patient reported that he has been using cocaine over the course of 8 months to one year, daily, at least 3-4 times per day. Patient reported that he has been turning to cocaine to get his abdominal pain under control. Reported that last use of cocaine was on Thursday/Friday.  Alert and oriented. BS normoactive in all four quadrants. Tenderness upon palpation to the LLQ and suprapubic region of the abdomen, positive guarding. Negative McBurney's point. Negative Murphy's sign. Negative acute abdomen, negative peritoneal signs. Lungs clear to auscultation bilaterally to upper and lower lobes. Heart rate and rhythm normal. Pulses palpable, proximal and distal. EKG negative ischemic changes identified. Troponin negative elevation. CBC mild elevation white blood cell count noted, 14.5-negative leukocytosis identified. CMP mild hyponatremia identified, 129. Lipase negative elevation. Lactic acid negative elevation. Urine negative for infection. Urine drug screen positive for cocaine and cannabis. CT abdomen and pelvis without contrast noted nonobstructive left nephrolithiasis, negative acute abnormalities identified. CT head negative findings identified. Patient stable, afebrile. Blood pressure 143/75, heart rate 60 bpm, and temperature 98.58F. Negative signs of sepsis. Chest discomfort doubt cardiac in nature. Patient able to tolerate foods and liquids PO without an episode of emesis. Discussed case and lab findings with Dr. Dorris Carnes. [place] - as per physician cleared patient for discharge. Negative acute abnormalities identified. Pain controlled in ED setting. Stable while in ED setting. Negative active shakes or tremors identified. Patient discharged. Definitive etiology of abdominal pain unknown, chronic issue - negative exacerbation of pancreatitis - negative elevation in lipase, WBC, and fever. Discharged patient. Discharged patient with anti-emetics. Referred  patient to GI and PCP. Discussed with patient to rest and stay hydrated. Discussed with patient proper diet. Discussed with patient to return back to the ED if abdominal pain is to continue or worsen. Discussed with patient to continue to monitor symptoms and if symptoms are to worsen or change to report back to the ED - strict return instructions given.   Patient agreed to plan of care, understood, all questions answered.   Raymon Mutton, PA-C 08/12/13 0305  Raymon Mutton, PA-C 08/12/13 1218

## 2013-08-11 NOTE — ED Notes (Signed)
Bed: WA04 Expected date:  Expected time:  Means of arrival:  Comments: EMS-abdominal pain 

## 2013-08-11 NOTE — ED Notes (Signed)
Per EMS reports pt coming from home with c/o abdominal pain since Friday. Per EMS pt reports addiction to crack cocaine, last used Friday, now having severe abdominal pain.

## 2013-08-12 NOTE — ED Provider Notes (Signed)
Medical screening examination/treatment/procedure(s) were performed by non-physician practitioner and as supervising physician I was immediately available for consultation/collaboration.  Ashni Lonzo R. Bach Rocchi, MD 08/12/13 1549 

## 2013-08-22 ENCOUNTER — Emergency Department (HOSPITAL_COMMUNITY): Payer: Medicare Other

## 2013-08-22 ENCOUNTER — Emergency Department (HOSPITAL_COMMUNITY)
Admission: EM | Admit: 2013-08-22 | Discharge: 2013-08-22 | Disposition: A | Discharge status: Home / Self Care | Payer: Medicare Other | Attending: Emergency Medicine | Admitting: Emergency Medicine

## 2013-08-22 ENCOUNTER — Encounter (HOSPITAL_COMMUNITY): Payer: Self-pay | Admitting: Emergency Medicine

## 2013-08-22 DIAGNOSIS — Z8639 Personal history of other endocrine, nutritional and metabolic disease: Secondary | ICD-10-CM | POA: Insufficient documentation

## 2013-08-22 DIAGNOSIS — G8929 Other chronic pain: Secondary | ICD-10-CM | POA: Insufficient documentation

## 2013-08-22 DIAGNOSIS — Z79899 Other long term (current) drug therapy: Secondary | ICD-10-CM | POA: Insufficient documentation

## 2013-08-22 DIAGNOSIS — Z8679 Personal history of other diseases of the circulatory system: Secondary | ICD-10-CM | POA: Insufficient documentation

## 2013-08-22 DIAGNOSIS — Z862 Personal history of diseases of the blood and blood-forming organs and certain disorders involving the immune mechanism: Secondary | ICD-10-CM | POA: Insufficient documentation

## 2013-08-22 DIAGNOSIS — Z8669 Personal history of other diseases of the nervous system and sense organs: Secondary | ICD-10-CM | POA: Insufficient documentation

## 2013-08-22 DIAGNOSIS — F17200 Nicotine dependence, unspecified, uncomplicated: Secondary | ICD-10-CM | POA: Insufficient documentation

## 2013-08-22 DIAGNOSIS — R1084 Generalized abdominal pain: Secondary | ICD-10-CM | POA: Insufficient documentation

## 2013-08-22 DIAGNOSIS — Z9089 Acquired absence of other organs: Secondary | ICD-10-CM | POA: Insufficient documentation

## 2013-08-22 DIAGNOSIS — Z8719 Personal history of other diseases of the digestive system: Secondary | ICD-10-CM | POA: Insufficient documentation

## 2013-08-22 DIAGNOSIS — Z9889 Other specified postprocedural states: Secondary | ICD-10-CM | POA: Insufficient documentation

## 2013-08-22 DIAGNOSIS — R112 Nausea with vomiting, unspecified: Secondary | ICD-10-CM | POA: Insufficient documentation

## 2013-08-22 DIAGNOSIS — Z8659 Personal history of other mental and behavioral disorders: Secondary | ICD-10-CM | POA: Insufficient documentation

## 2013-08-22 DIAGNOSIS — Z8739 Personal history of other diseases of the musculoskeletal system and connective tissue: Secondary | ICD-10-CM | POA: Insufficient documentation

## 2013-08-22 LAB — URINALYSIS, ROUTINE W REFLEX MICROSCOPIC
Bilirubin Urine: NEGATIVE
Hgb urine dipstick: NEGATIVE
Ketones, ur: NEGATIVE mg/dL
Nitrite: NEGATIVE
Specific Gravity, Urine: 1.016 (ref 1.005–1.030)
Urobilinogen, UA: 0.2 mg/dL (ref 0.0–1.0)
pH: 8 (ref 5.0–8.0)

## 2013-08-22 LAB — CBC
HCT: 37.4 % — ABNORMAL LOW (ref 39.0–52.0)
Hemoglobin: 12.9 g/dL — ABNORMAL LOW (ref 13.0–17.0)
RBC: 4.28 MIL/uL (ref 4.22–5.81)
WBC: 11 10*3/uL — ABNORMAL HIGH (ref 4.0–10.5)

## 2013-08-22 LAB — COMPREHENSIVE METABOLIC PANEL
ALT: 8 U/L (ref 0–53)
Alkaline Phosphatase: 73 U/L (ref 39–117)
BUN: 7 mg/dL (ref 6–23)
Chloride: 96 mEq/L (ref 96–112)
GFR calc Af Amer: >90 mL/min (ref >90)
Glucose, Bld: 104 mg/dL — ABNORMAL HIGH (ref 70–99)
Potassium: 3.5 mEq/L (ref 3.5–5.1)
Sodium: 135 mEq/L (ref 135–145)
Total Bilirubin: 0.5 mg/dL (ref 0.3–1.2)

## 2013-08-22 LAB — LIPASE, BLOOD: Lipase: 25 U/L (ref 11–59)

## 2013-08-22 MED ORDER — HYDROMORPHONE HCL PF 1 MG/ML IJ SOLN
1.0000 mg | Freq: Once | INTRAMUSCULAR | Status: AC
  Administered 2013-08-22: 1 mg via INTRAVENOUS
  Filled 2013-08-22: qty 1

## 2013-08-22 MED ORDER — ONDANSETRON HCL 4 MG/2ML IJ SOLN
4.0000 mg | Freq: Once | INTRAMUSCULAR | Status: AC
  Administered 2013-08-22: 4 mg via INTRAVENOUS
  Filled 2013-08-22: qty 2

## 2013-08-22 MED ORDER — HYDROMORPHONE HCL PF 1 MG/ML IJ SOLN
1.0000 mg | Freq: Once | INTRAMUSCULAR | Status: DC
  Filled 2013-08-22: qty 1

## 2013-08-22 MED ORDER — SODIUM CHLORIDE 0.9 % IV BOLUS (SEPSIS)
1000.0000 mL | Freq: Once | INTRAVENOUS | Status: AC
  Administered 2013-08-22: 1000 mL via INTRAVENOUS

## 2013-08-22 MED ORDER — HYDROMORPHONE HCL PF 1 MG/ML IJ SOLN
1.0000 mg | Freq: Once | INTRAMUSCULAR | Status: AC
  Administered 2013-08-22: 1 mg via INTRAVENOUS

## 2013-08-22 NOTE — ED Notes (Signed)
Bed: WU98 Expected date:  Expected time:  Means of arrival:  Comments: pancreatitis

## 2013-08-22 NOTE — ED Provider Notes (Signed)
CSN: [id number]     Arrival date & time 08/22/13  1201 History   First MD Initiated Contact with Patient 08/22/13 1220     Chief Complaint  Patient presents with  . Emesis  . Abdominal Pain   (Consider location/radiation/quality/duration/timing/severity/associated sxs/prior Treatment) HPI Pt presenting with c/o abdominal pain and emesis.  He has hx of chronic pancreatitis.  Was seen in the ED approx 10 days ago for similar symptoms- states at that time he felt improved after meds and IV fluids.  Current pain and vomiting began again 2 days ago.  No fever/chills.  No diarrhea or changes in stools.  Pain is diffuse and cramping in nature.  States he has not been able to keep down liquids since yesterday.  Denies drinking etoh- states last drink 15 years ago.  Has not had any treatment at home prior to arrival.  Is working on getting an appointment with [facility] for his chronic abdominal pain.  There are no other associated systemic symptoms, there are no other alleviating or modifying factors.   Past Medical History  Diagnosis Date  . Chest pain   . Chronic pancreatitis   . 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 Migraine   . Seizure disorder     x 7 years  . Zollinger-Ellison syndrome   . GI bleed   . Chronic pain   . Migraine headache   . Chronic neck pain   . Pain management contract agreement   . Depression   . Seizures    Past Surgical History  Procedure Laterality Date  . Partial gastrectomy    . Eye surgery    . Hernia repair    . Small intestine surgery    . Appendectomy      per CT 2012- s/p appendectomy   Family History  Problem Relation Age of Onset  . Angina Mother   . Nephrolithiasis Father    History  Substance Use Topics  . Smoking status: Current Every Day Smoker -- 2.00 packs/day    Types: Cigarettes    Last Attempt to Quit: 07/29/2000  . Smokeless tobacco: Not on file     Comment: about 2 packs a day  . Alcohol Use: No    Review of  Systems ROS reviewed and all otherwise negative except for mentioned in HPI  Allergies  Iohexol and Sonata  Home Medications   Current Outpatient Rx  Name  Route  Sig  Dispense  Refill  . omeprazole (PRILOSEC) 20 MG capsule   Oral   Take 1 capsule (20 mg total) by mouth daily.   5 capsule   0   . promethazine (PHENERGAN) 25 MG tablet   Oral   Take 1 tablet (25 mg total) by mouth every 6 (six) hours as needed for nausea.   30 tablet   0    BP 150/93  Pulse 71  Temp(Src) 97.5 F (36.4 C) (Oral)  Resp 16  SpO2 99% Vitals reviewed Physical Exam Physical Examination: General appearance - alert, well appearing, and in no distress Mental status - alert, oriented to person, place, and time Eyes - no scleral icterus, no conjunctival injection Mouth - mucous membranes moist, pharynx normal without lesions Chest - clear to auscultation, no wheezes, rales or rhonchi, symmetric air entry Heart - normal rate, regular rhythm, normal S1, S2, no murmurs, rubs, clicks or gallops Abdomen - soft, mild diffuse ttp more in bilateral lower abdomen, nondistended, no masses  or organomegaly, nabs Extremities - peripheral pulses normal, no pedal edema, no clubbing or cyanosis Skin - normal coloration and turgor, no rashes  ED Course  Procedures (including critical care time)  3:04 PM pt advised of lab results.  He has been drinking liquids without further vomiting.  States he feels somewhat improved.  He is asking for a rx for narcotic pain medication, I have advised him that we are not able to give him rx for narcotics based on the chronic nature of his pain.  He has been referred to pain clinic in the past, PMD note stated concern for substance abuse.  He verbalized understanding.   Labs Review Labs Reviewed  CBC - Abnormal; Notable for the following:    WBC 11.0 (*)    Hemoglobin 12.9 (*)    HCT 37.4 (*)    All other components within normal limits  COMPREHENSIVE METABOLIC PANEL -  Abnormal; Notable for the following:    Glucose, Bld 104 (*)    Albumin 3.4 (*)    All other components within normal limits  URINALYSIS, ROUTINE W REFLEX MICROSCOPIC - Abnormal; Notable for the following:    APPearance CLOUDY (*)    All other components within normal limits  LIPASE, BLOOD   Imaging Review Dg Abd Acute W/chest  08/22/2013   CLINICAL DATA:  Chronic pancreatitis with pain  EXAM: ACUTE ABDOMEN SERIES (ABDOMEN 2 VIEW & CHEST 1 VIEW)  COMPARISON:  CT abdomen and pelvis August 11, 2013 the; chest radiograph January 27, 2013  FINDINGS: PA chest: There is a probable nipple shadow on the left. There is no edema or consolidation. Heart size and pulmonary vascularity are normal. No adenopathy. There are surgical clips in the gastroesophageal junction region.  Supine and upright abdomen: There are multiple surgical clips in the left abdomen. There are also clips in the pelvis. The bowel gas pattern is unremarkable. There is moderate stool throughout the colon. No obstruction or free air.  IMPRESSION: Postoperative change. Bowel gas pattern unremarkable. Moderate generalized stool. No edema or consolidation.   Electronically Signed   By: Bretta Bang   On: 08/22/2013 13:59    MDM   1. Chronic abdominal pain   2. Nausea and vomiting    Pt presenting with c/o abdominal pain vomiting.  He has chronic abdominal pain, pancreatitis.  Labs reassuring- WBC is decreased compared to most recent visit.  He is tolerating po fluids.  I have d/w him the importance of GI followup and encouraged him to f/u with his PMD and with GI.  Discharged with strict return precautions.  Pt agreeable with plan.   Ethelda Chick, MD 08/22/13 [phone]

## 2013-08-22 NOTE — ED Notes (Signed)
Patient transported to X-ray 

## 2013-08-22 NOTE — ED Notes (Signed)
Pt reports n/v and abd pain since Friday afternoon. Pt has hx of pancreatitis and feels like same. Denies diarrhea. Seen here 2 weeks ago for same, states it decreased and then returned.

## 2013-08-26 ENCOUNTER — Encounter (HOSPITAL_COMMUNITY): Payer: Self-pay

## 2013-08-26 ENCOUNTER — Telehealth: Payer: Self-pay

## 2013-08-26 ENCOUNTER — Emergency Department (HOSPITAL_COMMUNITY)
Admission: EM | Admit: 2013-08-26 | Discharge: 2013-08-26 | Disposition: A | Discharge status: Home / Self Care | Payer: Medicare Other | Attending: Emergency Medicine | Admitting: Emergency Medicine

## 2013-08-26 DIAGNOSIS — Z79899 Other long term (current) drug therapy: Secondary | ICD-10-CM | POA: Insufficient documentation

## 2013-08-26 DIAGNOSIS — Z8719 Personal history of other diseases of the digestive system: Secondary | ICD-10-CM | POA: Insufficient documentation

## 2013-08-26 DIAGNOSIS — R1110 Vomiting, unspecified: Secondary | ICD-10-CM | POA: Insufficient documentation

## 2013-08-26 DIAGNOSIS — Z8679 Personal history of other diseases of the circulatory system: Secondary | ICD-10-CM | POA: Insufficient documentation

## 2013-08-26 DIAGNOSIS — Z8739 Personal history of other diseases of the musculoskeletal system and connective tissue: Secondary | ICD-10-CM | POA: Insufficient documentation

## 2013-08-26 DIAGNOSIS — Z8659 Personal history of other mental and behavioral disorders: Secondary | ICD-10-CM | POA: Insufficient documentation

## 2013-08-26 DIAGNOSIS — Z8639 Personal history of other endocrine, nutritional and metabolic disease: Secondary | ICD-10-CM | POA: Insufficient documentation

## 2013-08-26 DIAGNOSIS — R1084 Generalized abdominal pain: Secondary | ICD-10-CM | POA: Insufficient documentation

## 2013-08-26 DIAGNOSIS — Z8669 Personal history of other diseases of the nervous system and sense organs: Secondary | ICD-10-CM | POA: Insufficient documentation

## 2013-08-26 DIAGNOSIS — F17200 Nicotine dependence, unspecified, uncomplicated: Secondary | ICD-10-CM | POA: Insufficient documentation

## 2013-08-26 DIAGNOSIS — Z862 Personal history of diseases of the blood and blood-forming organs and certain disorders involving the immune mechanism: Secondary | ICD-10-CM | POA: Insufficient documentation

## 2013-08-26 DIAGNOSIS — G8929 Other chronic pain: Secondary | ICD-10-CM

## 2013-08-26 LAB — COMPREHENSIVE METABOLIC PANEL
AST: 16 U/L (ref 0–37)
BUN: 11 mg/dL (ref 6–23)
CO2: 28 mEq/L (ref 19–32)
Calcium: 9.4 mg/dL (ref 8.4–10.5)
Chloride: 97 mEq/L (ref 96–112)
Creatinine, Ser: 0.58 mg/dL (ref 0.50–1.35)
GFR calc Af Amer: >90 mL/min (ref >90)
GFR calc non Af Amer: >90 mL/min (ref >90)
Glucose, Bld: 101 mg/dL — ABNORMAL HIGH (ref 70–99)
Sodium: 136 mEq/L (ref 135–145)
Total Bilirubin: 0.4 mg/dL (ref 0.3–1.2)
Total Protein: 7.7 g/dL (ref 6.0–8.3)

## 2013-08-26 LAB — URINALYSIS, ROUTINE W REFLEX MICROSCOPIC
Glucose, UA: NEGATIVE mg/dL
Hgb urine dipstick: NEGATIVE
Leukocytes, UA: NEGATIVE
Protein, ur: NEGATIVE mg/dL
Specific Gravity, Urine: 1.019 (ref 1.005–1.030)
Urobilinogen, UA: 1 mg/dL (ref 0.0–1.0)

## 2013-08-26 LAB — CBC WITH DIFFERENTIAL/PLATELET
Basophils Absolute: 0 10*3/uL (ref 0.0–0.1)
Basophils Relative: 0 % (ref 0–1)
Eosinophils Absolute: 0.1 10*3/uL (ref 0.0–0.7)
Hemoglobin: 14.1 g/dL (ref 13.0–17.0)
Lymphs Abs: 2.1 10*3/uL (ref 0.7–4.0)
MCH: 30.7 pg (ref 26.0–34.0)
MCHC: 35 g/dL (ref 30.0–36.0)
Monocytes Relative: 11 % (ref 3–12)
Neutro Abs: 7.4 10*3/uL (ref 1.7–7.7)
Neutrophils Relative %: 69 % (ref 43–77)
Platelets: 342 10*3/uL (ref 150–400)
RBC: 4.59 MIL/uL (ref 4.22–5.81)
WBC: 10.7 10*3/uL — ABNORMAL HIGH (ref 4.0–10.5)

## 2013-08-26 LAB — LIPASE, BLOOD: Lipase: 29 U/L (ref 11–59)

## 2013-08-26 MED ORDER — ONDANSETRON HCL 4 MG/2ML IJ SOLN
4.0000 mg | Freq: Once | INTRAMUSCULAR | Status: AC
  Administered 2013-08-26: 4 mg via INTRAVENOUS
  Filled 2013-08-26: qty 2

## 2013-08-26 MED ORDER — ONDANSETRON HCL 4 MG PO TABS: 4.0000 mg | ORAL_TABLET | Freq: Four times a day (QID) | ORAL | Status: DC

## 2013-08-26 MED ORDER — HYDROMORPHONE HCL PF 1 MG/ML IJ SOLN
1.0000 mg | Freq: Once | INTRAMUSCULAR | Status: AC
  Administered 2013-08-26: 1 mg via INTRAVENOUS
  Filled 2013-08-26: qty 1

## 2013-08-26 MED ORDER — SODIUM CHLORIDE 0.9 % IV BOLUS (SEPSIS)
1000.0000 mL | Freq: Once | INTRAVENOUS | Status: AC
  Administered 2013-08-26: 1000 mL via INTRAVENOUS

## 2013-08-26 NOTE — Telephone Encounter (Signed)
Can you refer? Or do we need office visit? I have pended referral

## 2013-08-26 NOTE — Telephone Encounter (Signed)
Called and LMOM.  I do need to know why he needs this referral so I can let [facility] know.  Please give me a call with more details and I am glad to make the referral

## 2013-08-26 NOTE — ED Notes (Signed)
Pt unable to urinate at this time. Pt aware of need for urine sample, and urinal left at bedside.

## 2013-08-26 NOTE — Telephone Encounter (Signed)
Pt is wanting to get a referral to [facility]   Best number [phone]

## 2013-08-26 NOTE — ED Notes (Signed)
Per EMS, Pt c/o abdominal pain and emesis x 2 weeks.  Sts pt has been seen at [facility] for same complaint and referred to a GI doctor.  Pt sts he has been unable to make an appointment.  Sts he is here for pain management.  Vitals are stable.

## 2013-08-26 NOTE — ED Provider Notes (Signed)
CSN: [id number]     Arrival date & time 08/26/13  1710 History   First MD Initiated Contact with Patient 08/26/13 1824     Chief Complaint  Patient presents with  . Abdominal Pain  . Emesis   (Consider location/radiation/quality/duration/timing/severity/associated sxs/prior Treatment) HPI Comments: Patient is a 52 year old male with history of chronic pancreatitis, migraine, seizure disorder, Zollinger-Ellison syndrome, GI bleed, chronic pain, depression, seizures. He reports that for the last 2 weeks he has had emesis and a crampy abdominal pain. The pain is worse just below his umbilicus and radiates to his back. He reports that his pain is mildly improved with eating, but approximately 15 minutes after eating it feels as though the food gets stuck in his intestines. He reports that this pain is worse than his chronic pancreatitis. He has been seen and evaluated in the past with labs, x-ray, CT and he reports that they have not found any definitive source of his pain. He was referred to GI and is in the process of making an appointment with [facility]. He reports that he has had a 15 pound weight loss in the past 2 weeks due to not being able to eat. He is able to keep vanilla pudding and Tang down. He has very small bowel movements. His emesis is non-bloody. He reports that he has some relief when he curls up in a fetal position and his Bertram [place] terrier is sleep in his stomach and between his legs.  Patient is a 52 y.o. male presenting with abdominal pain and vomiting.  Abdominal Pain Associated symptoms: vomiting   Associated symptoms: no chills, no diarrhea, no dysuria, no fever and no hematuria   Emesis Associated symptoms: abdominal pain   Associated symptoms: no chills and no diarrhea     Past Medical History  Diagnosis Date  . Chest pain   . Chronic pancreatitis   . 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 Migraine   . Seizure disorder     x 7 years  .  Zollinger-Ellison syndrome   . GI bleed   . Chronic pain   . Migraine headache   . Chronic neck pain   . Pain management contract agreement   . Depression   . Seizures    Past Surgical History  Procedure Laterality Date  . Partial gastrectomy    . Eye surgery    . Hernia repair    . Small intestine surgery    . Appendectomy      per CT 2012- s/p appendectomy   Family History  Problem Relation Age of Onset  . Angina Mother   . Nephrolithiasis Father    History  Substance Use Topics  . Smoking status: Current Every Day Smoker -- 2.00 packs/day    Types: Cigarettes    Last Attempt to Quit: 07/29/2000  . Smokeless tobacco: Not on file     Comment: about 2 packs a day  . Alcohol Use: No    Review of Systems  Constitutional: Negative for fever and chills.  Gastrointestinal: Positive for vomiting and abdominal pain. Negative for diarrhea.  Genitourinary: Negative for dysuria, frequency, hematuria, discharge, penile swelling, difficulty urinating, penile pain and testicular pain.  All other systems reviewed and are negative.    Allergies  Iohexol and Sonata  Home Medications   Current Outpatient Rx  Name  Route  Sig  Dispense  Refill  . omeprazole (PRILOSEC) 20 MG capsule   Oral  Take 1 capsule (20 mg total) by mouth daily.   5 capsule   0   . promethazine (PHENERGAN) 25 MG tablet   Oral   Take 1 tablet (25 mg total) by mouth every 6 (six) hours as needed for nausea.   30 tablet   0    BP 157/79  Pulse 80  Temp(Src) 98.9 F (37.2 C) (Oral)  Resp 17  Ht 5\' 5"  (1.651 m)  Wt 101 lb (45.813 kg)  BMI 16.81 kg/m2  SpO2 100% Physical Exam  Nursing note and vitals reviewed. Constitutional: He is oriented to person, place, and time. He appears well-developed and well-nourished. No distress.  HENT:  Head: Normocephalic and atraumatic.  Right Ear: External ear normal.  Left Ear: External ear normal.  Nose: Nose normal.  Eyes: Conjunctivae are normal.   Neck: Normal range of motion. No tracheal deviation present.  Cardiovascular: Normal rate, regular rhythm and normal heart sounds.   Pulmonary/Chest: Effort normal and breath sounds normal. No stridor.  Abdominal: Soft. He exhibits no distension. There is generalized tenderness. There is guarding (voluntary). There is no rigidity and no rebound. Hernia confirmed negative in the right inguinal area and confirmed negative in the left inguinal area.  Generalized tenderness with maximal site just under umbilicus. No hernia palpated.   Genitourinary: Testes normal and penis normal. Right testis shows no mass, no swelling and no tenderness. Right testis is descended. Cremasteric reflex is not absent on the right side. Left testis shows no mass, no swelling and no tenderness. Left testis is descended. Cremasteric reflex is not absent on the left side. Uncircumcised. No phimosis, paraphimosis, hypospadias, penile erythema or penile tenderness. No discharge found.  Musculoskeletal: Normal range of motion.  Lymphadenopathy:       Right: No inguinal adenopathy present.       Left: No inguinal adenopathy present.  Neurological: He is alert and oriented to person, place, and time.  Skin: Skin is warm and dry. He is not diaphoretic.  Psychiatric: He has a normal mood and affect. His behavior is normal.    ED Course  Procedures (including critical care time) Labs Review Labs Reviewed  COMPREHENSIVE METABOLIC PANEL - Abnormal; Notable for the following:    Glucose, Bld 101 (*)    All other components within normal limits  CBC WITH DIFFERENTIAL - Abnormal; Notable for the following:    WBC 10.7 (*)    Monocytes Absolute 1.2 (*)    All other components within normal limits  URINALYSIS, ROUTINE W REFLEX MICROSCOPIC - Abnormal; Notable for the following:    APPearance CLOUDY (*)    All other components within normal limits  LIPASE, BLOOD   Imaging Review No results found.  MDM   1. Chronic  abdominal pain    Patient is nontoxic, nonseptic appearing, in no apparent distress.  Patient's pain and other symptoms adequately managed in emergency department.  Fluid bolus given.  Labs and vitals reviewed. Imaging from prior visits for same pain reviewed. Previous imaging unremarkable. Patient does not meet the SIRS or Sepsis criteria.  On repeat exam patient does not have a surgical abdomen and there are no peritoneal signs.  No indication of appendicitis, bowel obstruction, bowel perforation, cholecystitis, diverticulitis.  Patient discharged home with symptomatic treatment and given strict instructions for follow-up with [facility]. He has phone calls out in attempts to successfully get the referral. I have also discussed reasons to return immediately to the ER.  Patient expresses understanding and agrees  with plan. Discussed case with Dr. Silverio Lay who agrees with plan.   Medications  sodium chloride 0.9 % bolus 1,000 mL (0 mLs Intravenous Stopped 08/26/13 2014)  HYDROmorphone (DILAUDID) injection 1 mg (1 mg Intravenous Given 08/26/13 1914)  ondansetron (ZOFRAN) injection 4 mg (4 mg Intravenous Given 08/26/13 1914)  HYDROmorphone (DILAUDID) injection 1 mg (1 mg Intravenous Given 08/26/13 2002)  HYDROmorphone (DILAUDID) injection 1 mg (1 mg Intravenous Given 08/26/13 2201)    Mora Bellman, PA-C 08/26/13 2329

## 2013-08-27 NOTE — ED Provider Notes (Signed)
Medical screening examination/treatment/procedure(s) were performed by non-physician practitioner and as supervising physician I was immediately available for consultation/collaboration.   Richardean Canal, MD 08/27/13 [phone]

## 2013-08-27 NOTE — Telephone Encounter (Signed)
Pt called an left voicemail 08-26-13 at the referral desk stating that he was at emergent room then, and he has already been there twice. He states that his digestive system is shutting down, he can't go to the bathroom. He has had terrible pain. He also states that there is blockage to be found. He cant digested food he just throws it up.

## 2013-08-30 ENCOUNTER — Encounter (HOSPITAL_COMMUNITY): Payer: Self-pay | Admitting: *Deleted

## 2013-08-30 ENCOUNTER — Emergency Department (HOSPITAL_COMMUNITY)
Admission: EM | Admit: 2013-08-30 | Discharge: 2013-08-30 | Disposition: A | Discharge status: Home / Self Care | Payer: Medicare Other | Attending: Emergency Medicine | Admitting: Emergency Medicine

## 2013-08-30 ENCOUNTER — Emergency Department (HOSPITAL_COMMUNITY): Payer: Medicare Other

## 2013-08-30 DIAGNOSIS — F329 Major depressive disorder, single episode, unspecified: Secondary | ICD-10-CM | POA: Insufficient documentation

## 2013-08-30 DIAGNOSIS — K861 Other chronic pancreatitis: Secondary | ICD-10-CM | POA: Insufficient documentation

## 2013-08-30 DIAGNOSIS — R1110 Vomiting, unspecified: Secondary | ICD-10-CM | POA: Insufficient documentation

## 2013-08-30 DIAGNOSIS — Z79899 Other long term (current) drug therapy: Secondary | ICD-10-CM | POA: Insufficient documentation

## 2013-08-30 DIAGNOSIS — G8929 Other chronic pain: Secondary | ICD-10-CM | POA: Insufficient documentation

## 2013-08-30 DIAGNOSIS — F17200 Nicotine dependence, unspecified, uncomplicated: Secondary | ICD-10-CM | POA: Insufficient documentation

## 2013-08-30 DIAGNOSIS — G40909 Epilepsy, unspecified, not intractable, without status epilepticus: Secondary | ICD-10-CM | POA: Insufficient documentation

## 2013-08-30 DIAGNOSIS — R109 Unspecified abdominal pain: Secondary | ICD-10-CM

## 2013-08-30 DIAGNOSIS — IMO0002 Reserved for concepts with insufficient information to code with codable children: Secondary | ICD-10-CM | POA: Insufficient documentation

## 2013-08-30 DIAGNOSIS — E164 Increased secretion of gastrin: Secondary | ICD-10-CM | POA: Insufficient documentation

## 2013-08-30 DIAGNOSIS — F3289 Other specified depressive episodes: Secondary | ICD-10-CM | POA: Insufficient documentation

## 2013-08-30 LAB — COMPREHENSIVE METABOLIC PANEL
ALT: 20 U/L (ref 0–53)
AST: 19 U/L (ref 0–37)
Alkaline Phosphatase: 93 U/L (ref 39–117)
CO2: 28 mEq/L (ref 19–32)
Chloride: 93 mEq/L — ABNORMAL LOW (ref 96–112)
Creatinine, Ser: 0.68 mg/dL (ref 0.50–1.35)
GFR calc Af Amer: >90 mL/min (ref >90)
GFR calc non Af Amer: >90 mL/min (ref >90)
Glucose, Bld: 127 mg/dL — ABNORMAL HIGH (ref 70–99)
Potassium: 4.1 mEq/L (ref 3.5–5.1)
Sodium: 134 mEq/L — ABNORMAL LOW (ref 135–145)
Total Bilirubin: 0.6 mg/dL (ref 0.3–1.2)

## 2013-08-30 LAB — URINE MICROSCOPIC-ADD ON

## 2013-08-30 LAB — CBC WITH DIFFERENTIAL/PLATELET
Basophils Absolute: 0 10*3/uL (ref 0.0–0.1)
HCT: 43.1 % (ref 39.0–52.0)
Lymphocytes Relative: 17 % (ref 12–46)
Lymphs Abs: 1.9 10*3/uL (ref 0.7–4.0)
MCV: 89 fL (ref 78.0–100.0)
Neutro Abs: 8.4 10*3/uL — ABNORMAL HIGH (ref 1.7–7.7)
Neutrophils Relative %: 73 % (ref 43–77)
Platelets: 402 10*3/uL — ABNORMAL HIGH (ref 150–400)
RBC: 4.84 MIL/uL (ref 4.22–5.81)
RDW: 14.2 % (ref 11.5–15.5)
WBC: 11.4 10*3/uL — ABNORMAL HIGH (ref 4.0–10.5)

## 2013-08-30 LAB — URINALYSIS, ROUTINE W REFLEX MICROSCOPIC
Bilirubin Urine: NEGATIVE
Glucose, UA: NEGATIVE mg/dL
Hgb urine dipstick: NEGATIVE
Ketones, ur: NEGATIVE mg/dL
Leukocytes, UA: NEGATIVE
Protein, ur: 30 mg/dL — AB

## 2013-08-30 LAB — LIPASE, BLOOD: Lipase: 25 U/L (ref 11–59)

## 2013-08-30 LAB — CG4 I-STAT (LACTIC ACID): Lactic Acid, Venous: 3 mmol/L — ABNORMAL HIGH (ref 0.5–2.2)

## 2013-08-30 MED ORDER — ONDANSETRON HCL 4 MG/2ML IJ SOLN
4.0000 mg | Freq: Once | INTRAMUSCULAR | Status: AC
  Administered 2013-08-30: 4 mg via INTRAVENOUS
  Filled 2013-08-30: qty 2

## 2013-08-30 MED ORDER — TRAMADOL HCL 50 MG PO TABS
50.0000 mg | ORAL_TABLET | Freq: Four times a day (QID) | ORAL | Status: DC | PRN
Start: 2013-08-30 — End: 2013-09-12

## 2013-08-30 MED ORDER — SUCRALFATE 1 GM/10ML PO SUSP: 1.0000 g | Freq: Four times a day (QID) | ORAL | Status: DC

## 2013-08-30 MED ORDER — HYDROMORPHONE HCL PF 1 MG/ML IJ SOLN
1.0000 mg | Freq: Once | INTRAMUSCULAR | Status: AC
  Administered 2013-08-30: 1 mg via INTRAVENOUS
  Filled 2013-08-30: qty 1

## 2013-08-30 MED ORDER — SODIUM CHLORIDE 0.9 % IV BOLUS (SEPSIS)
1000.0000 mL | Freq: Once | INTRAVENOUS | Status: AC
  Administered 2013-08-30: 1000 mL via INTRAVENOUS

## 2013-08-30 MED ORDER — TRAMADOL HCL 50 MG PO TABS
50.0000 mg | ORAL_TABLET | Freq: Once | ORAL | Status: AC
  Administered 2013-08-30: 50 mg via ORAL
  Filled 2013-08-30: qty 1

## 2013-08-30 MED ORDER — PROMETHAZINE HCL 25 MG PO TABS: 25.0000 mg | ORAL_TABLET | Freq: Four times a day (QID) | ORAL | Status: DC | PRN

## 2013-08-30 NOTE — ED Notes (Signed)
Bed: ZO10 Expected date:  Expected time:  Means of arrival:  Comments: Triage [place]

## 2013-08-30 NOTE — ED Notes (Signed)
Pt reports abdominal pain, has been seen in ED several times recently. Has appointment with [facility] on Thursday. At present pt actively vomiting, abdominal pain 8/10. Has not had bowel movement in 3 days. Vomit smells foul.

## 2013-08-30 NOTE — ED Notes (Signed)
Patient states that he cannot use the restroom at this time. Will retry after ct.

## 2013-08-30 NOTE — ED Provider Notes (Signed)
CSN: [id number]     Arrival date & time 08/30/13  1008 History   First MD Initiated Contact with Patient 08/30/13 1038     Chief Complaint  Patient presents with  . Abdominal Pain  . Emesis   (Consider location/radiation/quality/duration/timing/severity/associated sxs/prior Treatment) HPI Comments: Patient returns today for further evaluation of continued abdominal pain. Patient has been seen several times previously. Patient reports severe pain below the umbilicus across the abdomen and radiating into the back. The pain has been continuous. He reports that he has been seen in the ER multiple times, had CAT scans, labs but no abnormalities were seen. The patient has extensive past medical history including significant surgical history. GI followup scheduled on Thursday of this week, but could not wait because of the pain. Patient reports that everything he eats comes back up,cannot hold anything down.   Patient is a 52 y.o. male presenting with abdominal pain and vomiting.  Abdominal Pain Associated symptoms: vomiting   Associated symptoms: no fever   Emesis Associated symptoms: abdominal pain     Past Medical History  Diagnosis Date  . Chest pain   . Chronic pancreatitis   . 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 Migraine   . Seizure disorder     x 7 years  . Zollinger-Ellison syndrome   . GI bleed   . Chronic pain   . Migraine headache   . Chronic neck pain   . Pain management contract agreement   . Depression   . Seizures    Past Surgical History  Procedure Laterality Date  . Partial gastrectomy    . Eye surgery    . Hernia repair    . Small intestine surgery    . Appendectomy      per CT 2012- s/p appendectomy   Family History  Problem Relation Age of Onset  . Angina Mother   . Nephrolithiasis Father    History  Substance Use Topics  . Smoking status: Current Every Day Smoker -- 2.00 packs/day    Types: Cigarettes    Last Attempt to Quit: 07/29/2000  .  Smokeless tobacco: Not on file     Comment: about 2 packs a day  . Alcohol Use: No    Review of Systems  Constitutional: Negative for fever.  Gastrointestinal: Positive for vomiting and abdominal pain.  All other systems reviewed and are negative.    Allergies  Iohexol and Sonata  Home Medications   Current Outpatient Rx  Name  Route  Sig  Dispense  Refill  . omeprazole (PRILOSEC) 20 MG capsule   Oral   Take 1 capsule (20 mg total) by mouth daily.   5 capsule   0   . ondansetron (ZOFRAN) 4 MG tablet   Oral   Take 4 mg by mouth every 6 (six) hours.         . promethazine (PHENERGAN) 25 MG tablet   Oral   Take 1 tablet (25 mg total) by mouth every 6 (six) hours as needed for nausea.   30 tablet   0    BP 202/118  Pulse 90  Temp(Src) 98.9 F (37.2 C) (Oral)  Resp 18  SpO2 99% Physical Exam  Constitutional: He is oriented to person, place, and time. He appears well-developed and well-nourished. He appears distressed.  HENT:  Head: Normocephalic and atraumatic.  Right Ear: Hearing normal.  Left Ear: Hearing normal.  Nose: Nose normal.  Mouth/Throat: Oropharynx is  clear and moist and mucous membranes are normal.  Eyes: Conjunctivae and EOM are normal. Pupils are equal, round, and reactive to light.  Neck: Normal range of motion. Neck supple.  Cardiovascular: Regular rhythm, S1 normal and S2 normal.  Exam reveals no gallop and no friction rub.   No murmur heard. Pulmonary/Chest: Effort normal and breath sounds normal. No respiratory distress. He exhibits no tenderness.  Abdominal: Soft. Normal appearance and bowel sounds are normal. There is no hepatosplenomegaly. There is tenderness in the right lower quadrant, suprapubic area and left lower quadrant. There is no rebound, no guarding, no tenderness at McBurney's point and negative Murphy's sign. No hernia.  Musculoskeletal: Normal range of motion.  Neurological: He is alert and oriented to person, place, and time.  He has normal strength. No cranial nerve deficit or sensory deficit. Coordination normal. GCS eye subscore is 4. GCS verbal subscore is 5. GCS motor subscore is 6.  Skin: Skin is warm, dry and intact. No rash noted. No cyanosis.  Psychiatric: He has a normal mood and affect. His speech is normal and behavior is normal. Thought content normal.    ED Course  Procedures (including critical care time) Labs Review Labs Reviewed  CBC WITH DIFFERENTIAL - Abnormal; Notable for the following:    WBC 11.4 (*)    Platelets 402 (*)    Neutro Abs 8.4 (*)    Monocytes Absolute 1.1 (*)    All other components within normal limits  COMPREHENSIVE METABOLIC PANEL - Abnormal; Notable for the following:    Sodium 134 (*)    Chloride 93 (*)    Glucose, Bld 127 (*)    All other components within normal limits  URINALYSIS, ROUTINE W REFLEX MICROSCOPIC - Abnormal; Notable for the following:    APPearance TURBID (*)    Protein, ur 30 (*)    All other components within normal limits  CG4 I-STAT (LACTIC ACID) - Abnormal; Notable for the following:    Lactic Acid, Venous 3.00 (*)    All other components within normal limits  LIPASE, BLOOD  URINE MICROSCOPIC-ADD ON   Imaging Review Ct Abdomen Pelvis Wo Contrast  08/30/2013   CLINICAL DATA:  Abdominal pain, vomiting, no bowel movement for 3 days  EXAM: CT ABDOMEN AND PELVIS WITHOUT CONTRAST  TECHNIQUE: Multidetector CT imaging of the abdomen and pelvis was performed following the standard protocol without intravenous contrast.  COMPARISON:  08/11/2013  FINDINGS: Lung bases are stable. Nodular scarring right base laterally is stable. Stable postsurgical changes in proximal stomach and GE junction region.  Unenhanced liver shows no biliary ductal dilatation. The pancreas, spleen and adrenals are unremarkable. Unenhanced kidneys are symmetrical in size. Nonobstructive left renal calculus again noted measures 2.7 mm. No calcified ureteral calculi. No hydronephrosis or  hydroureter.  Oral contrast was given to the patient. No small bowel obstruction. No ascites or free air. No adenopathy. Some stool are noted in right colon. The terminal ileum is unremarkable. Post appendectomy surgical clips are noted. No distal colonic obstruction.  A cyst in upper pole of the right kidney again noted measures about 2 cm stable from prior exam. Mild atherosclerotic calcifications of abdominal aorta. No aortic aneurysm.  The urinary bladder is unremarkable. Bilateral distal ureter is unremarkable.  IMPRESSION: 1. Stable postsurgical changes within proximal stomach. 2. Left nonobstructive nephrolithiasis again noted. No hydronephrosis or hydroureter. 3. No small bowel obstruction. No colonic obstruction. 4. Status post appendectomy. 5. Unremarkable urinary bladder.   Electronically Signed  ByNatasha Mead   On: 08/30/2013 15:26    MDM   1. Abdominal pain    Patient presents to the ER for evaluation of abdominal pain. Patient has been seen multiple times previously for similar pains. He has followup with GI scheduled in 4 days. This is exam revealed diffuse tenderness, mostly in the lower abdomen. Lab work was unremarkable other than slightly elevated lactate, unclear etiology. Patient was hydrated and medicated. CAT scan was repeated to further evaluate this and no acute findings are seen. The do not suspect ischemic bowel at this point. Patient's pain has been continuous and chronic for several weeks or more. Patient is encouraged to followup with his GI doctor as scheduled.  Gilda Crease, MD 08/30/13 1630

## 2013-08-30 NOTE — ED Notes (Signed)
Patient transported to CT 

## 2013-08-30 NOTE — Telephone Encounter (Signed)
Pt continues to call referrals checking on his GI referral. Looking at pt notes, the referral is from an outside source (not placed at [facility]).  What can we do to help pt with his appt? Can dr copland make a referral to GI so we can schedule pt? Otherwise, the referring provider is the one he should contact.  Pt best: [phone]  Thanks,  becky

## 2013-08-31 ENCOUNTER — Telehealth: Payer: Self-pay

## 2013-08-31 DIAGNOSIS — R109 Unspecified abdominal pain: Secondary | ICD-10-CM

## 2013-08-31 NOTE — Telephone Encounter (Signed)
Called patient. He called while I was in the room with a patient. Patient indicates he does not have an appt with GI, he has been to [facility] in the past, but not recently. He indicates he called them for an appt, but they would not make one for him without a referral. I have put in the referral per Dr Patsy Lager, and have asked Lupita Leash in referrals to try to get this for him soon.

## 2013-08-31 NOTE — Telephone Encounter (Signed)
Called ED note indicates he has appt with GI on Thurs, who is this with? Left another message for him to call me back. Discussed with Dr Patsy Lager, she needs to know who the appt is scheduled with.

## 2013-08-31 NOTE — Telephone Encounter (Signed)
Pt states he is needing to get a referral to [facility] as soon as possible

## 2013-09-01 NOTE — Telephone Encounter (Signed)
Called [facility]- they will call him and schedule an appt

## 2013-09-12 ENCOUNTER — Emergency Department (HOSPITAL_COMMUNITY)
Admission: EM | Admit: 2013-09-12 | Discharge: 2013-09-12 | Disposition: A | Discharge status: Home / Self Care | Payer: Medicare Other | Attending: Emergency Medicine | Admitting: Emergency Medicine

## 2013-09-12 ENCOUNTER — Encounter (HOSPITAL_COMMUNITY): Payer: Self-pay | Admitting: Emergency Medicine

## 2013-09-12 ENCOUNTER — Emergency Department (HOSPITAL_COMMUNITY): Payer: Medicare Other

## 2013-09-12 DIAGNOSIS — G8929 Other chronic pain: Secondary | ICD-10-CM | POA: Insufficient documentation

## 2013-09-12 DIAGNOSIS — Z8719 Personal history of other diseases of the digestive system: Secondary | ICD-10-CM | POA: Insufficient documentation

## 2013-09-12 DIAGNOSIS — Z8679 Personal history of other diseases of the circulatory system: Secondary | ICD-10-CM | POA: Insufficient documentation

## 2013-09-12 DIAGNOSIS — Z8669 Personal history of other diseases of the nervous system and sense organs: Secondary | ICD-10-CM | POA: Insufficient documentation

## 2013-09-12 DIAGNOSIS — F3289 Other specified depressive episodes: Secondary | ICD-10-CM | POA: Insufficient documentation

## 2013-09-12 DIAGNOSIS — R112 Nausea with vomiting, unspecified: Secondary | ICD-10-CM | POA: Insufficient documentation

## 2013-09-12 DIAGNOSIS — R1084 Generalized abdominal pain: Secondary | ICD-10-CM | POA: Insufficient documentation

## 2013-09-12 DIAGNOSIS — Z79899 Other long term (current) drug therapy: Secondary | ICD-10-CM | POA: Insufficient documentation

## 2013-09-12 DIAGNOSIS — F329 Major depressive disorder, single episode, unspecified: Secondary | ICD-10-CM | POA: Insufficient documentation

## 2013-09-12 DIAGNOSIS — Z8739 Personal history of other diseases of the musculoskeletal system and connective tissue: Secondary | ICD-10-CM | POA: Insufficient documentation

## 2013-09-12 DIAGNOSIS — F17200 Nicotine dependence, unspecified, uncomplicated: Secondary | ICD-10-CM | POA: Insufficient documentation

## 2013-09-12 LAB — COMPREHENSIVE METABOLIC PANEL
ALT: 10 U/L (ref 0–53)
AST: 17 U/L (ref 0–37)
CO2: 30 mEq/L (ref 19–32)
Chloride: 95 mEq/L — ABNORMAL LOW (ref 96–112)
GFR calc non Af Amer: >90 mL/min (ref >90)
Glucose, Bld: 96 mg/dL (ref 70–99)
Potassium: 3.2 mEq/L — ABNORMAL LOW (ref 3.5–5.1)
Sodium: 136 mEq/L (ref 135–145)
Total Bilirubin: 0.3 mg/dL (ref 0.3–1.2)
Total Protein: 7.4 g/dL (ref 6.0–8.3)

## 2013-09-12 LAB — URINALYSIS, ROUTINE W REFLEX MICROSCOPIC
Glucose, UA: NEGATIVE mg/dL
Hgb urine dipstick: NEGATIVE
Ketones, ur: NEGATIVE mg/dL
Nitrite: NEGATIVE
Protein, ur: NEGATIVE mg/dL
pH: 8.5 — ABNORMAL HIGH (ref 5.0–8.0)

## 2013-09-12 LAB — CG4 I-STAT (LACTIC ACID)
Lactic Acid, Venous: 3.27 mmol/L — ABNORMAL HIGH (ref 0.5–2.2)
Lactic Acid, Venous: 0.87 mmol/L (ref 0.5–2.2)

## 2013-09-12 LAB — CBC WITH DIFFERENTIAL/PLATELET
Basophils Absolute: 0 10*3/uL (ref 0.0–0.1)
Eosinophils Relative: 1 % (ref 0–5)
HCT: 38.7 % — ABNORMAL LOW (ref 39.0–52.0)
Hemoglobin: 13.1 g/dL (ref 13.0–17.0)
Lymphocytes Relative: 19 % (ref 12–46)
MCV: 88.2 fL (ref 78.0–100.0)
Monocytes Relative: 12 % (ref 3–12)
Neutro Abs: 5.8 10*3/uL (ref 1.7–7.7)
Neutrophils Relative %: 68 % (ref 43–77)
Platelets: 331 10*3/uL (ref 150–400)
RBC: 4.39 MIL/uL (ref 4.22–5.81)
RDW: 14.1 % (ref 11.5–15.5)
WBC: 8.6 10*3/uL (ref 4.0–10.5)

## 2013-09-12 LAB — URINE MICROSCOPIC-ADD ON

## 2013-09-12 MED ORDER — MORPHINE SULFATE 4 MG/ML IJ SOLN
4.0000 mg | Freq: Once | INTRAMUSCULAR | Status: AC
  Administered 2013-09-12: 4 mg via INTRAVENOUS
  Filled 2013-09-12: qty 1

## 2013-09-12 MED ORDER — SODIUM CHLORIDE 0.9 % IV BOLUS (SEPSIS)
1000.0000 mL | Freq: Once | INTRAVENOUS | Status: AC
  Administered 2013-09-12: 1000 mL via INTRAVENOUS

## 2013-09-12 MED ORDER — HYDROMORPHONE HCL PF 1 MG/ML IJ SOLN
1.0000 mg | Freq: Once | INTRAMUSCULAR | Status: AC
  Administered 2013-09-12: 1 mg via INTRAVENOUS
  Filled 2013-09-12: qty 1

## 2013-09-12 MED ORDER — TRAMADOL HCL 50 MG PO TABS: 50.0000 mg | ORAL_TABLET | Freq: Four times a day (QID) | ORAL | Status: DC | PRN

## 2013-09-12 MED ORDER — ONDANSETRON HCL 4 MG/2ML IJ SOLN
4.0000 mg | Freq: Once | INTRAMUSCULAR | Status: AC
  Administered 2013-09-12: 4 mg via INTRAVENOUS
  Filled 2013-09-12: qty 2

## 2013-09-12 MED ORDER — DOCUSATE SODIUM 100 MG PO CAPS
100.0000 mg | ORAL_CAPSULE | Freq: Once | ORAL | Status: AC
  Administered 2013-09-12: 100 mg via ORAL
  Filled 2013-09-12: qty 1

## 2013-09-12 MED ORDER — ONDANSETRON HCL 4 MG PO TABS: 4.0000 mg | ORAL_TABLET | Freq: Four times a day (QID) | ORAL | Status: DC

## 2013-09-12 MED ORDER — PROMETHAZINE HCL 25 MG/ML IJ SOLN
12.5000 mg | Freq: Once | INTRAMUSCULAR | Status: AC
  Administered 2013-09-12: 12.5 mg via INTRAVENOUS
  Filled 2013-09-12: qty 1

## 2013-09-12 NOTE — ED Notes (Signed)
Pt unable to provide UA at this time, will continue to monitor.

## 2013-09-12 NOTE — ED Provider Notes (Signed)
Medical screening examination/treatment/procedure(s) were performed by non-physician practitioner and as supervising physician I was immediately available for consultation/collaboration.  Megan E Docherty, MD 09/12/13 2232 

## 2013-09-12 NOTE — ED Notes (Addendum)
Pt reports hx of extensive abdominal surgeries in the past. Reports he has abdominal pain 8/10. Reports nausea and vomiting x12 since 2200 last night. Reports he has been trying to get into [facility], but must wait 2 more weeks to see them. Hx of chronic pancreatitis. Denies ETOH use.

## 2013-09-12 NOTE — ED Provider Notes (Signed)
Care assumed from [place], [place] at shift change. Awaiting repeat lactate. If improved from 3.27, plan to discharge home. 4:25 PM Repeat lactic acid 0.87. He will be discharged home.  Trevor Mace, PA-C 09/12/13 1626

## 2013-09-12 NOTE — ED Notes (Signed)
Elevated I stat Lactic -  PA Tresa Endo aware.

## 2013-09-12 NOTE — ED Notes (Signed)
Patient transported to X-ray 

## 2013-09-12 NOTE — ED Provider Notes (Signed)
CSN: [id number]     Arrival date & time 09/12/13  1116 History   First MD Initiated Contact with Patient 09/12/13 1142     Chief Complaint  Patient presents with  . Abdominal Pain   (Consider location/radiation/quality/duration/timing/severity/associated sxs/prior Treatment) HPI Comments: Patient is a 52 year old male with a history of chronic pancreatitis, chronic pain, and GI bleed who presents for recurrent chronic abdominal pain. Patient has been seen and evaluated in the emergency department for the same in the past, most recently  2 weeks ago. Patient states that her abdominal pain worsened last evening. He admits to associated persistent nausea and multiple episodes of nonbloody, nonbilious emesis the last of which was 4 hours ago. Patient states the only thing that helps her symptoms is a heat pack to his abdomen and back. He has been attempting to see a gastroenterologist at with bowel or without success; states he is hoping to be seen the next 2 weeks. Patient was previously followed by Dr. Elnoria Howard, but states he did not like the care he was getting because "they didn't see eye to eye".  Patient is a 52 y.o. male presenting with abdominal pain. The history is provided by the patient. No language interpreter was used.  Abdominal Pain Pain location:  Generalized Pain quality: aching and stabbing   Pain radiates to:  Does not radiate Pain severity:  Moderate Duration:  1 day Timing:  Constant Progression:  Unchanged Chronicity:  Chronic Context: previous surgery   Context: not diet changes, not recent travel and not trauma   Relieved by: heating pads to stomach and back. Worsened by:  Nothing tried Ineffective treatments:  None tried Associated symptoms: nausea and vomiting   Associated symptoms: no chest pain, no constipation, no dysuria, no fever, no hematemesis, no hematochezia, no hematuria, no melena and no shortness of breath   Risk factors: multiple surgeries     Past  Medical History  Diagnosis Date  . Chest pain   . Chronic pancreatitis   . 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 Migraine   . Seizure disorder     x 7 years  . Zollinger-Ellison syndrome   . GI bleed   . Chronic pain   . Migraine headache   . Chronic neck pain   . Pain management contract agreement   . Depression   . Seizures    Past Surgical History  Procedure Laterality Date  . Partial gastrectomy    . Eye surgery    . Hernia repair    . Small intestine surgery    . Appendectomy      per CT 2012- s/p appendectomy   Family History  Problem Relation Age of Onset  . Angina Mother   . Nephrolithiasis Father    History  Substance Use Topics  . Smoking status: Current Every Day Smoker -- 2.00 packs/day    Types: Cigarettes    Last Attempt to Quit: 07/29/2000  . Smokeless tobacco: Not on file     Comment: about 2 packs a day  . Alcohol Use: No    Review of Systems  Constitutional: Negative for fever.  Respiratory: Negative for shortness of breath.   Cardiovascular: Negative for chest pain.  Gastrointestinal: Positive for nausea, vomiting and abdominal pain. Negative for constipation, blood in stool, melena, hematochezia and hematemesis.  Genitourinary: Negative for dysuria and hematuria.  All other systems reviewed and are negative.    Allergies  Iohexol and Sonata  Home Medications   Current Outpatient Rx  Name  Route  Sig  Dispense  Refill  . omeprazole (PRILOSEC) 20 MG capsule   Oral   Take 1 capsule (20 mg total) by mouth daily.   5 capsule   0   . sucralfate (CARAFATE) 1 GM/10ML suspension   Oral   Take 10 mLs (1 g total) by mouth 4 (four) times daily.   420 mL   0   . ondansetron (ZOFRAN) 4 MG tablet   Oral   Take 1 tablet (4 mg total) by mouth every 6 (six) hours.   20 tablet   0   . traMADol (ULTRAM) 50 MG tablet   Oral   Take 1 tablet (50 mg total) by mouth every 6 (six) hours as needed for pain.   15 tablet   0    BP 149/97   Pulse 63  Temp(Src) 97.5 F (36.4 C)  Resp 16  SpO2 100%  Physical Exam  Nursing note and vitals reviewed. Constitutional: He is oriented to person, place, and time. He appears well-developed and well-nourished. No distress.  HENT:  Head: Normocephalic and atraumatic.  Eyes: Conjunctivae and EOM are normal. No scleral icterus.  Neck: Normal range of motion.  Cardiovascular: Normal rate, regular rhythm and normal heart sounds.   Pulmonary/Chest: Effort normal and breath sounds normal. No respiratory distress. He has no wheezes. He has no rales.  Abdominal: Soft. Bowel sounds are normal. He exhibits no distension and no mass. There is tenderness (mild, diffuse). There is no rebound and no guarding.  No peritoneal sign or involuntary guarding  Musculoskeletal: Normal range of motion.  Neurological: He is alert and oriented to person, place, and time.  Skin: Skin is warm and dry. No rash noted. He is not diaphoretic. No erythema. No pallor.  Psychiatric: He has a normal mood and affect. His behavior is normal.    ED Course  Procedures (including critical care time) Labs Review Labs Reviewed  CBC WITH DIFFERENTIAL - Abnormal; Notable for the following:    HCT 38.7 (*)    All other components within normal limits  COMPREHENSIVE METABOLIC PANEL - Abnormal; Notable for the following:    Potassium 3.2 (*)    Chloride 95 (*)    Albumin 3.4 (*)    All other components within normal limits  URINALYSIS, ROUTINE W REFLEX MICROSCOPIC - Abnormal; Notable for the following:    APPearance TURBID (*)    pH 8.5 (*)    All other components within normal limits  CG4 I-STAT (LACTIC ACID) - Abnormal; Notable for the following:    Lactic Acid, Venous 3.27 (*)    All other components within normal limits  LIPASE, BLOOD  URINE MICROSCOPIC-ADD ON   Imaging Review Dg Abd Acute W/chest  09/12/2013   CLINICAL DATA:  Abdominal pain.  EXAM: ACUTE ABDOMEN SERIES (ABDOMEN 2 VIEW & CHEST 1 VIEW)   COMPARISON:  CT, 08/30/2013.  FINDINGS: No obstruction. No free air. Bowel anastomosis staples are noted in the left mid and left upper abdomen and in the lower pelvis. Mild increased stool is noted in the colon. There are surgical vascular clips in the epigastrium. The soft tissues are otherwise unremarkable.  Small nodular density projects in the left lower lung on the frontal view not evident on a chest radiograph dated 01/27/2013. This may reflect a nipple shadow. The lungs are otherwise clear. The heart, mediastinum and hila are unremarkable.  IMPRESSION: No acute findings. No evidence  of obstruction or free air.  Multiple surgical changes in the abdomen.  Mild increased stool in the colon.  Small nodular density in the left lower lung zone may reflect a nipple shadow. Recommend repeat chest radiograph with nipple markers on a nonemergent basis.   Electronically Signed   By: Amie Portland M.D.   On: 09/12/2013 14:50    EKG Interpretation   None       MDM   1. Chronic abdominal pain    52 year old male with a history of chronic pancreatitis and chronic pain presents for persistent abdominal pain today with associated nausea and vomiting. No peritoneal signs or evidence of acute surgical abdomen on physical exam. Labs consistent with priors. Lactate is elevated today to 3.27. This is largely unchanged from a lactate of 3.0 two weeks ago. There is no evidence of an anion gap acidosis. Will treat with IV fluids, Zofran, and narcotics and reassess.  Patient endorses improvement in symptoms with narcotics, antiemetics, and IVF and is agreeable to further management of symptoms at home. Improved abdominal reexamination and no evidence of obstruction or perforation on acute abdominal series; he is also now eating a sandwich and in no visible or audible discomfort in exam room bed. Patient signed out to Johnnette Gourd, PA-C at shift change for dispo with second lactate pending. Anticipate d/c with zofran  and ultram for symptoms and GI follow up as long as no worsening in lactate level after aggressive fluid hydration.    Antony Madura, PA-C 09/17/13 [phone]

## 2013-09-20 ENCOUNTER — Encounter (HOSPITAL_COMMUNITY): Payer: Self-pay | Admitting: Emergency Medicine

## 2013-09-20 ENCOUNTER — Emergency Department (HOSPITAL_COMMUNITY)
Admission: EM | Admit: 2013-09-20 | Discharge: 2013-09-20 | Disposition: A | Discharge status: Home / Self Care | Payer: Medicare Other | Attending: Emergency Medicine | Admitting: Emergency Medicine

## 2013-09-20 DIAGNOSIS — Z862 Personal history of diseases of the blood and blood-forming organs and certain disorders involving the immune mechanism: Secondary | ICD-10-CM | POA: Insufficient documentation

## 2013-09-20 DIAGNOSIS — F17200 Nicotine dependence, unspecified, uncomplicated: Secondary | ICD-10-CM | POA: Insufficient documentation

## 2013-09-20 DIAGNOSIS — Z8659 Personal history of other mental and behavioral disorders: Secondary | ICD-10-CM | POA: Insufficient documentation

## 2013-09-20 DIAGNOSIS — Z8719 Personal history of other diseases of the digestive system: Secondary | ICD-10-CM | POA: Insufficient documentation

## 2013-09-20 DIAGNOSIS — Z79899 Other long term (current) drug therapy: Secondary | ICD-10-CM | POA: Insufficient documentation

## 2013-09-20 DIAGNOSIS — Z8679 Personal history of other diseases of the circulatory system: Secondary | ICD-10-CM | POA: Insufficient documentation

## 2013-09-20 DIAGNOSIS — Z8639 Personal history of other endocrine, nutritional and metabolic disease: Secondary | ICD-10-CM | POA: Insufficient documentation

## 2013-09-20 DIAGNOSIS — R109 Unspecified abdominal pain: Secondary | ICD-10-CM | POA: Insufficient documentation

## 2013-09-20 DIAGNOSIS — R112 Nausea with vomiting, unspecified: Secondary | ICD-10-CM | POA: Insufficient documentation

## 2013-09-20 DIAGNOSIS — Z8669 Personal history of other diseases of the nervous system and sense organs: Secondary | ICD-10-CM | POA: Insufficient documentation

## 2013-09-20 DIAGNOSIS — G8929 Other chronic pain: Secondary | ICD-10-CM | POA: Insufficient documentation

## 2013-09-20 LAB — URINALYSIS, ROUTINE W REFLEX MICROSCOPIC
Hgb urine dipstick: NEGATIVE
Ketones, ur: NEGATIVE mg/dL
Leukocytes, UA: NEGATIVE
Nitrite: NEGATIVE
Protein, ur: NEGATIVE mg/dL
Urobilinogen, UA: 1 mg/dL (ref 0.0–1.0)
pH: 8 (ref 5.0–8.0)

## 2013-09-20 MED ORDER — PANTOPRAZOLE SODIUM 40 MG IV SOLR
40.0000 mg | INTRAVENOUS | Status: AC
  Administered 2013-09-20: 40 mg via INTRAVENOUS
  Filled 2013-09-20: qty 40

## 2013-09-20 MED ORDER — SODIUM CHLORIDE 0.9 % IV BOLUS (SEPSIS)
1000.0000 mL | Freq: Once | INTRAVENOUS | Status: AC
  Administered 2013-09-20: 1000 mL via INTRAVENOUS

## 2013-09-20 MED ORDER — HYDROMORPHONE HCL PF 1 MG/ML IJ SOLN
1.0000 mg | Freq: Once | INTRAMUSCULAR | Status: AC
  Administered 2013-09-20: 1 mg via INTRAVENOUS
  Filled 2013-09-20: qty 1

## 2013-09-20 MED ORDER — ONDANSETRON HCL 4 MG/2ML IJ SOLN
4.0000 mg | Freq: Once | INTRAMUSCULAR | Status: AC
  Administered 2013-09-20: 4 mg via INTRAVENOUS
  Filled 2013-09-20: qty 2

## 2013-09-20 MED ORDER — FAMOTIDINE 20 MG PO TABS: 20.0000 mg | ORAL_TABLET | Freq: Two times a day (BID) | ORAL | Status: DC

## 2013-09-20 MED ORDER — OXYCODONE-ACETAMINOPHEN 5-325 MG PO TABS
2.0000 | ORAL_TABLET | Freq: Once | ORAL | Status: AC
  Administered 2013-09-20: 2 via ORAL
  Filled 2013-09-20: qty 2

## 2013-09-20 MED ORDER — TRAMADOL HCL 50 MG PO TABS: 50.0000 mg | ORAL_TABLET | Freq: Four times a day (QID) | ORAL | Status: DC | PRN

## 2013-09-20 NOTE — ED Provider Notes (Signed)
Medical screening examination/treatment/procedure(s) were performed by non-physician practitioner and as supervising physician I was immediately available for consultation/collaboration.   Gwyneth Sprout, MD 09/20/13 [phone]

## 2013-09-20 NOTE — ED Notes (Addendum)
Pt reports ongoing abdominal pain and vomiting over past month, was last seen here 10/12 for same complaints. Emesis and pain became more severe Saturday night. States applying heating pad to abd helps relieve pain. Denies diarrhea or fever.

## 2013-09-20 NOTE — ED Provider Notes (Signed)
CSN: [id number]     Arrival date & time 09/20/13  1121 History   First MD Initiated Contact with Patient 09/20/13 1145     Chief Complaint  Patient presents with  . Abdominal Pain  . Emesis   (Consider location/radiation/quality/duration/timing/severity/associated sxs/prior Treatment) HPI Comments: 52 year old male with a history of multiple medical problems and multiple abdominal surgeries who presents with a complaint of abdominal pain described as lower abdominal, bilateral pelvic, radiates to the back, constant, present for the last several weeks, associated nausea and vomiting daily. He states that he is having difficulty eating because after he eats he vomits this up immediately. He also states he has a history of chronic pancreatitis though he states that this feels different. He has been seen multiple times in the last 2 months and has had multiple imaging procedures and laboratory workups, none of which have been definitively diagnosed in his condition. He denies fever, denies dysuria, admits to having intermittent diarrhea and intermittent constipation. There has been no blood in the stools, no blood in his vomit. Nothing seems to make this better or worse. He endorses using Prilosec, Zofran and occasional tramadol. 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 He is supposed to see a gastroenterologist this week but has not yet have an appointment.  Patient is a 52 y.o. male presenting with abdominal pain and vomiting. The history is provided by the patient and medical records.  Abdominal Pain Associated symptoms: vomiting   Emesis Associated symptoms: abdominal pain     Past Medical History  Diagnosis Date  . Chest pain   . Chronic pancreatitis   . 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 Migraine   . Seizure disorder     x 7 years  . Zollinger-Ellison syndrome   . GI bleed   . Chronic pain   . Migraine headache   . Chronic neck  pain   . Pain management contract agreement   . Depression   . Seizures    Past Surgical History  Procedure Laterality Date  . Partial gastrectomy    . Eye surgery    . Hernia repair    . Small intestine surgery    . Appendectomy      per CT 2012- s/p appendectomy   Family History  Problem Relation Age of Onset  . Angina Mother   . Nephrolithiasis Father    History  Substance Use Topics  . Smoking status: Current Every Day Smoker -- 2.00 packs/day    Types: Cigarettes    Last Attempt to Quit: 07/29/2000  . Smokeless tobacco: Not on file     Comment: about 2 packs a day  . Alcohol Use: No    Review of Systems  Gastrointestinal: Positive for vomiting and abdominal pain.  All other systems reviewed and are negative.    Allergies  Iohexol and Sonata  Home Medications   Current Outpatient Rx  Name  Route  Sig  Dispense  Refill  . omeprazole (PRILOSEC) 20 MG capsule   Oral   Take 1 capsule (20 mg total) by mouth daily.   5 capsule   0   . ondansetron (ZOFRAN) 4 MG tablet   Oral   Take 4 mg by mouth every 6 (six) hours.         . traMADol (ULTRAM) 50 MG tablet   Oral   Take 1 tablet (50 mg  total) by mouth every 6 (six) hours as needed for pain.   15 tablet   0   . famotidine (PEPCID) 20 MG tablet   Oral   Take 1 tablet (20 mg total) by mouth 2 (two) times daily.   30 tablet   0   . traMADol (ULTRAM) 50 MG tablet   Oral   Take 1 tablet (50 mg total) by mouth every 6 (six) hours as needed for pain.   15 tablet   0    BP 117/85  Pulse 66  Temp(Src) 98.2 F (36.8 C) (Oral)  Resp 16  SpO2 100% Physical Exam  Nursing note and vitals reviewed. Constitutional: He appears well-developed and well-nourished. No distress.  HENT:  Head: Normocephalic and atraumatic.  Mouth/Throat: Oropharynx is clear and moist. No oropharyngeal exudate.  Eyes: Conjunctivae and EOM are normal. Pupils are equal, round, and reactive to light. Right eye exhibits no  discharge. Left eye exhibits no discharge. No scleral icterus.  Neck: Normal range of motion. Neck supple. No JVD present. No thyromegaly present.  Cardiovascular: Normal rate, regular rhythm, normal heart sounds and intact distal pulses.  Exam reveals no gallop and no friction rub.   No murmur heard. Pulmonary/Chest: Effort normal and breath sounds normal. No respiratory distress. He has no wheezes. He has no rales.  Abdominal: Soft. Bowel sounds are normal. He exhibits no distension and no mass. There is no tenderness ( bilateral lower abd ttp without guarding or peritoneal signs.  normla BS).  Musculoskeletal: Normal range of motion. He exhibits no edema and no tenderness.  Lymphadenopathy:    He has no cervical adenopathy.  Neurological: He is alert. Coordination normal.  Skin: Skin is warm and dry. No rash noted. No erythema.  Psychiatric: He has a normal mood and affect. His behavior is normal.    ED Course  Procedures (including critical care time) Labs Review Labs Reviewed  URINALYSIS, ROUTINE W REFLEX MICROSCOPIC - Abnormal; Notable for the following:    APPearance TURBID (*)    All other components within normal limits  URINE MICROSCOPIC-ADD ON   Imaging Review No results found.  EKG Interpretation   None       MDM   1. Abdominal pain    The pt appears chronically ill and has had 2 months of abd pain without definitive etiology - his lab work from his last visit 8 days ago showed normal blood counts, normal lytes other than mild hypokalemia and normal UA - lipase has chronically been normal.  VS normal.  UA normal, VS normal, pt tolerated fluids, had improved pain control with meds - needs GI f/u, short course of tramadol, add pepcid.  Meds given in ED:  Medications  sodium chloride 0.9 % bolus 1,000 mL (0 mLs Intravenous Stopped 09/20/13 1422)  ondansetron (ZOFRAN) injection 4 mg (4 mg Intravenous Given 09/20/13 1308)  pantoprazole (PROTONIX) injection 40 mg  (40 mg Intravenous Given 09/20/13 1308)  sodium chloride 0.9 % bolus 1,000 mL (0 mLs Intravenous Stopped 09/20/13 1500)  HYDROmorphone (DILAUDID) injection 1 mg (1 mg Intravenous Given 09/20/13 1425)  oxyCODONE-acetaminophen (PERCOCET/ROXICET) 5-325 MG per tablet 2 tablet (2 tablets Oral Given 09/20/13 1612)    New Prescriptions   FAMOTIDINE (PEPCID) 20 MG TABLET    Take 1 tablet (20 mg total) by mouth 2 (two) times daily.   TRAMADOL (ULTRAM) 50 MG TABLET    Take 1 tablet (50 mg total) by mouth every 6 (six) hours as needed for  pain.        Vida Roller, MD 09/20/13 [phone]

## 2013-09-28 ENCOUNTER — Encounter: Payer: Self-pay | Admitting: Internal Medicine

## 2013-09-28 NOTE — Progress Notes (Signed)
Dr Juanda Chance has reviewed extensive records from Dr Elnoria Howard on this patient. She has declined to accept patient into her practice.

## 2014-04-19 ENCOUNTER — Emergency Department (HOSPITAL_COMMUNITY)
Admission: EM | Admit: 2014-04-19 | Discharge: 2014-04-19 | Disposition: A | Discharge status: Home / Self Care | Payer: Medicare Other | Attending: Emergency Medicine | Admitting: Emergency Medicine

## 2014-04-19 ENCOUNTER — Encounter (HOSPITAL_COMMUNITY): Payer: Self-pay | Admitting: Emergency Medicine

## 2014-04-19 ENCOUNTER — Emergency Department (HOSPITAL_COMMUNITY): Payer: Medicare Other

## 2014-04-19 DIAGNOSIS — F17200 Nicotine dependence, unspecified, uncomplicated: Secondary | ICD-10-CM | POA: Insufficient documentation

## 2014-04-19 DIAGNOSIS — Z79899 Other long term (current) drug therapy: Secondary | ICD-10-CM | POA: Insufficient documentation

## 2014-04-19 DIAGNOSIS — Z8679 Personal history of other diseases of the circulatory system: Secondary | ICD-10-CM | POA: Insufficient documentation

## 2014-04-19 DIAGNOSIS — G8929 Other chronic pain: Secondary | ICD-10-CM | POA: Insufficient documentation

## 2014-04-19 DIAGNOSIS — Z8659 Personal history of other mental and behavioral disorders: Secondary | ICD-10-CM | POA: Insufficient documentation

## 2014-04-19 DIAGNOSIS — R112 Nausea with vomiting, unspecified: Secondary | ICD-10-CM

## 2014-04-19 DIAGNOSIS — K861 Other chronic pancreatitis: Secondary | ICD-10-CM | POA: Insufficient documentation

## 2014-04-19 DIAGNOSIS — Z8739 Personal history of other diseases of the musculoskeletal system and connective tissue: Secondary | ICD-10-CM | POA: Insufficient documentation

## 2014-04-19 DIAGNOSIS — Z862 Personal history of diseases of the blood and blood-forming organs and certain disorders involving the immune mechanism: Secondary | ICD-10-CM | POA: Insufficient documentation

## 2014-04-19 DIAGNOSIS — Z8639 Personal history of other endocrine, nutritional and metabolic disease: Secondary | ICD-10-CM | POA: Insufficient documentation

## 2014-04-19 LAB — COMPREHENSIVE METABOLIC PANEL
ALK PHOS: 98 U/L (ref 39–117)
ALT: 11 U/L (ref 0–53)
AST: 14 U/L (ref 0–37)
Albumin: 3.1 g/dL — ABNORMAL LOW (ref 3.5–5.2)
BUN: 15 mg/dL (ref 6–23)
CALCIUM: 9.6 mg/dL (ref 8.4–10.5)
CO2: 25 meq/L (ref 19–32)
Chloride: 97 mEq/L (ref 96–112)
Creatinine, Ser: 0.53 mg/dL (ref 0.50–1.35)
GFR calc Af Amer: >90 mL/min (ref >90)
GFR calc non Af Amer: >90 mL/min (ref >90)
Glucose, Bld: 111 mg/dL — ABNORMAL HIGH (ref 70–99)
POTASSIUM: 3.8 meq/L (ref 3.7–5.3)
Sodium: 134 mEq/L — ABNORMAL LOW (ref 137–147)
Total Bilirubin: 0.3 mg/dL (ref 0.3–1.2)
Total Protein: 7.3 g/dL (ref 6.0–8.3)

## 2014-04-19 LAB — URINALYSIS, ROUTINE W REFLEX MICROSCOPIC
BILIRUBIN URINE: NEGATIVE
Glucose, UA: NEGATIVE mg/dL
HGB URINE DIPSTICK: NEGATIVE
Ketones, ur: NEGATIVE mg/dL
Leukocytes, UA: NEGATIVE
Nitrite: NEGATIVE
Protein, ur: NEGATIVE mg/dL
SPECIFIC GRAVITY, URINE: 1.006 (ref 1.005–1.030)
Urobilinogen, UA: 0.2 mg/dL (ref 0.0–1.0)
pH: 7.5 (ref 5.0–8.0)

## 2014-04-19 LAB — CBC WITH DIFFERENTIAL/PLATELET
BASOS ABS: 0 10*3/uL (ref 0.0–0.1)
Basophils Relative: 0 % (ref 0–1)
EOS PCT: 1 % (ref 0–5)
Eosinophils Absolute: 0.1 10*3/uL (ref 0.0–0.7)
HCT: 41.3 % (ref 39.0–52.0)
Hemoglobin: 14.1 g/dL (ref 13.0–17.0)
LYMPHS ABS: 2 10*3/uL (ref 0.7–4.0)
Lymphocytes Relative: 22 % (ref 12–46)
MCH: 29.2 pg (ref 26.0–34.0)
MCHC: 34.1 g/dL (ref 30.0–36.0)
MCV: 85.5 fL (ref 78.0–100.0)
Monocytes Absolute: 1 10*3/uL (ref 0.1–1.0)
Monocytes Relative: 11 % (ref 3–12)
NEUTROS ABS: 6.1 10*3/uL (ref 1.7–7.7)
NEUTROS PCT: 66 % (ref 43–77)
PLATELETS: 385 10*3/uL (ref 150–400)
RBC: 4.83 MIL/uL (ref 4.22–5.81)
RDW: 14 % (ref 11.5–15.5)
WBC: 9.3 10*3/uL (ref 4.0–10.5)

## 2014-04-19 LAB — LIPASE, BLOOD: Lipase: 60 U/L — ABNORMAL HIGH (ref 11–59)

## 2014-04-19 MED ORDER — MORPHINE SULFATE 4 MG/ML IJ SOLN
4.0000 mg | Freq: Once | INTRAMUSCULAR | Status: AC
  Administered 2014-04-19: 4 mg via INTRAVENOUS
  Filled 2014-04-19: qty 1

## 2014-04-19 MED ORDER — HYDROMORPHONE HCL PF 2 MG/ML IJ SOLN
2.0000 mg | Freq: Once | INTRAMUSCULAR | Status: AC
  Administered 2014-04-19: 2 mg via INTRAVENOUS
  Filled 2014-04-19: qty 1

## 2014-04-19 MED ORDER — SODIUM CHLORIDE 0.9 % IV BOLUS (SEPSIS)
1000.0000 mL | Freq: Once | INTRAVENOUS | Status: AC
  Administered 2014-04-19: 1000 mL via INTRAVENOUS

## 2014-04-19 MED ORDER — OXYCODONE-ACETAMINOPHEN 5-325 MG PO TABS: 1.0000 | ORAL_TABLET | ORAL | Status: DC | PRN

## 2014-04-19 MED ORDER — KETOROLAC TROMETHAMINE 30 MG/ML IJ SOLN
30.0000 mg | Freq: Once | INTRAMUSCULAR | Status: AC
  Administered 2014-04-19: 30 mg via INTRAVENOUS
  Filled 2014-04-19: qty 1

## 2014-04-19 MED ORDER — ONDANSETRON HCL 4 MG PO TABS: 4.0000 mg | ORAL_TABLET | Freq: Four times a day (QID) | ORAL | Status: DC

## 2014-04-19 MED ORDER — HYDROMORPHONE HCL PF 1 MG/ML IJ SOLN
1.0000 mg | Freq: Once | INTRAMUSCULAR | Status: AC
  Administered 2014-04-19: 1 mg via INTRAVENOUS
  Filled 2014-04-19: qty 1

## 2014-04-19 NOTE — ED Notes (Signed)
PA at bedside.

## 2014-04-19 NOTE — ED Notes (Signed)
Writer requested a urine sample, pt sts he just went to the bathroom before he got here.

## 2014-04-19 NOTE — ED Notes (Signed)
Returned from xray

## 2014-04-19 NOTE — ED Provider Notes (Signed)
MSE was initiated and I personally evaluated the patient and placed orders (if any) at  5:53 AM on May 15, 514.  53 year old male with a history of chronic pancreatitis, chronic abdominal pain, and multiple abdominal surgeries presents to the emergency department for abdominal pain. Patient states that he has been followed at the [facility] for persistent pain. He states he had removal of some of his abdominal scar tissue a few months ago which really improved his symptoms. Abdominal pain has been diffuse and constant over the past 4 days. Symptoms associated with nausea and nonbloody/nonbilious emesis as well as constipation. Last bowel movement yesterday; patient denies melena or hematochezia. Patient denies associated fever, CP, urinary symptoms, numbness/tingling, and weakness.   Patient nontoxic/nonseptic appearing. Abdomen soft with diffuse tenderness to deep palpation. No peritoneal signs. No abdominal distention. Will further evaluate symptoms with labs and DG acute abdomen with chest.  The patient appears stable so that the remainder of the MSE may be completed by another provider.  Antonietta Breach, PA-C 04/19/14 [phone]

## 2014-04-19 NOTE — ED Notes (Signed)
Patient transported to X-ray 

## 2014-04-19 NOTE — ED Provider Notes (Signed)
CSN: [id number]     Arrival date & time 04/19/14  0448 History   First MD Initiated Contact with Patient 04/19/14 [phone]     Chief Complaint  Patient presents with  . Abdominal Pain     (Consider location/radiation/quality/duration/timing/severity/associated sxs/prior Treatment) The history is provided by the patient and medical records.   This is a 53 y.o. Male with PMH significant for chronic pancreatitis, migraine headaches, seizure disorder, depression, chronic abdominal pain, presenting to the ED for abdominal pain.  Patient states over the past several days he's been having increase in his normal abdominal pain associated with nausea and nonbloody, nonbilious emesis.  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  Patient has previously scheduled followup on Thursday, however has been unable to control his pain at home.  He denies fever, chills, diarrhea, melena or hematochezia.  Last BM was yesterday, no difficulty passing flatus.  No chest pain or SOB.  Past Medical History  Diagnosis Date  . Chest pain   . Chronic pancreatitis   . 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 Migraine   . Seizure disorder     x 7 years  . Zollinger-Ellison syndrome   . GI bleed   . Chronic pain   . Migraine headache   . Chronic neck pain   . Pain management contract agreement   . Depression   . Seizures    Past Surgical History  Procedure Laterality Date  . Partial gastrectomy    . Eye surgery    . Hernia repair    . Small intestine surgery    . Appendectomy      per CT 2012- s/p appendectomy   Family History  Problem Relation Age of Onset  . Angina Mother   . Nephrolithiasis Father    History  Substance Use Topics  . Smoking status: Current Every Day Smoker -- 2.00 packs/day    Types: Cigarettes    Last Attempt to Quit:  07/29/2000  . Smokeless tobacco: Not on file     Comment: about 2 packs a day  . Alcohol Use: No    Review of Systems  Gastrointestinal: Positive for nausea, vomiting and abdominal pain.  All other systems reviewed and are negative.     Allergies  Iohexol and Sonata  Home Medications   Prior to Admission medications   Medication Sig Start Date End Date Taking? Authorizing Provider  omeprazole (PRILOSEC) 20 MG capsule Take 20 mg by mouth 2 (two) times daily.   Yes Historical Provider, MD   BP 151/91  Pulse 75  Temp(Src) 98 F (36.7 C) (Oral)  Resp 18  Ht 5\' 5"  (1.651 m)  Wt 105 lb (47.628 kg)  BMI 17.47 kg/m2  SpO2 100%  Physical Exam  Nursing note and vitals reviewed. Constitutional: He is oriented to person, place, and time. He appears well-developed and well-nourished. No distress.  HENT:  Head: Normocephalic and atraumatic.  Mouth/Throat: Oropharynx is clear and moist.  Eyes: Conjunctivae and EOM are normal. Pupils are equal, round, and reactive to light.  Neck: Normal range of motion. Neck supple.  Cardiovascular: Normal rate, regular rhythm and normal heart sounds.   Pulmonary/Chest: Effort normal and breath sounds normal. No respiratory distress. He has no wheezes.  Abdominal: Soft. Bowel sounds are normal. There is generalized tenderness.  There is no rigidity, no guarding and no CVA tenderness.  Well healed midline surgical incisions; abdomen soft, non-distended, generalized tenderness without rebound or guarding.  Musculoskeletal: Normal range of motion.  Neurological: He is alert and oriented to person, place, and time.  Skin: Skin is warm and dry. He is not diaphoretic.  Psychiatric: He has a normal mood and affect.    ED Course  Procedures (including critical care time) Labs Review Labs Reviewed  COMPREHENSIVE METABOLIC PANEL - Abnormal; Notable for the following:    Sodium 134 (*)    Glucose, Bld 111 (*)    Albumin 3.1 (*)    All other components  within normal limits  LIPASE, BLOOD - Abnormal; Notable for the following:    Lipase 60 (*)    All other components within normal limits  CBC WITH DIFFERENTIAL  URINALYSIS, ROUTINE W REFLEX MICROSCOPIC    Imaging Review Dg Abd Acute W/chest  04/19/2014   CLINICAL DATA:  Mid abdominal pain.  Nausea and vomiting.  EXAM: ACUTE ABDOMEN SERIES (ABDOMEN 2 VIEW & CHEST 1 VIEW)  COMPARISON:  Acute abdominal series 09/12/2013.  FINDINGS: Emphysematous changes throughout the lungs. Subtle nodular opacity projecting over the left mid to lower lung, unchanged compared to the prior examination, favored to represent a prominent nipple shadow. No other larger more suspicious appearing pulmonary nodules or masses are noted. No acute consolidative airspace disease. No pleural effusions. No evidence of pulmonary edema. Heart size is normal. Mediastinal contours are within normal limits. Surgical clips in the gastroesophageal junction.  Suture line on the left side at the epigastric region. Multiple surgical clips in the upper abdomen and pelvis. Gas and stool are seen scattered throughout the colon extending to the level of the distal rectum. No pathologic distension of small bowel is noted. No gross evidence of pneumoperitoneum. Pelvic phleboliths.  IMPRESSION: 1.  Nonobstructive bowel gas pattern. 2. No pneumoperitoneum. 3. Emphysematous changes in the lungs. 4. No radiographic evidence of acute cardiopulmonary disease. 5. Postoperative changes, as above.   Electronically Signed   By: Vinnie Langton M.D.   On: 04/19/2014 06:43     EKG Interpretation None      MDM   Final diagnoses:  Pancreatitis, chronic  Nausea and vomiting   On initial evaluation, pt is afebrile and overall non-toxic appearing.  He has generalized abdominal tenderness without rebound, guarding, or peritoneal signs.  Labs obtained which are reassuring-- mild pancreatitis which is pts baseline.  Acute abdominal series with non-obstructive  gas bowel pattern and no signs of free air.  Will attempt pain control and PO challenge.  After 3 doses of pain medication, patient states pain is at a tolerable level. He has eaten an entire meal in the emergency department without recurrent vomiting. Pt will be discharged home with short supply of Percocet for pain control until his follow up appointment with the [facility] on Thursday.  Discussed plan with patient, he/she acknowledged understanding and agreed with plan of care.  Return precautions given for new or worsening symptoms.  Larene Pickett, PA-C 04/19/14 [phone]

## 2014-04-19 NOTE — ED Notes (Signed)
Pt unable to void at this time. Will try again once bolus is finished

## 2014-04-19 NOTE — Discharge Instructions (Signed)
Take the prescribed medication as directed. Follow-up with the [facility] on Thursday as previously scheduled. Return to the ED for new or worsening symptoms.

## 2014-04-19 NOTE — ED Notes (Signed)
Pt escorted to discharge window. Verbalized understanding discharge instructions. In no acute distress.  Vitals reviewed.  Pt educated not to drive.  Sts a buddy is picking him up.

## 2014-04-19 NOTE — ED Notes (Signed)
Pt states that he has had abdominal pain ongoing for awhile; pt states that the pain increased in Friday; pt states that he has had N/V and feels weak and dehydrated

## 2014-04-19 NOTE — ED Notes (Signed)
MD at bedside. EDPA Lattie Haw in to re evaluate this pt

## 2014-04-20 NOTE — ED Provider Notes (Signed)
Medical screening examination/treatment/procedure(s) were performed by non-physician practitioner and as supervising physician I was immediately available for consultation/collaboration.   EKG Interpretation None        Julianne Rice, MD 04/20/14 [phone]

## 2014-04-20 NOTE — ED Provider Notes (Signed)
Medical screening examination/treatment/procedure(s) were performed by non-physician practitioner and as supervising physician I was immediately available for consultation/collaboration.   EKG Interpretation None        Julianne Rice, MD 04/20/14 [phone]

## 2014-06-10 ENCOUNTER — Emergency Department (HOSPITAL_COMMUNITY)
Admission: EM | Admit: 2014-06-10 | Discharge: 2014-06-10 | Disposition: A | Discharge status: Home / Self Care | Payer: Medicare Other | Attending: Emergency Medicine | Admitting: Emergency Medicine

## 2014-06-10 ENCOUNTER — Encounter (HOSPITAL_COMMUNITY): Payer: Self-pay | Admitting: Emergency Medicine

## 2014-06-10 DIAGNOSIS — Z8679 Personal history of other diseases of the circulatory system: Secondary | ICD-10-CM | POA: Diagnosis not present

## 2014-06-10 DIAGNOSIS — F17200 Nicotine dependence, unspecified, uncomplicated: Secondary | ICD-10-CM | POA: Insufficient documentation

## 2014-06-10 DIAGNOSIS — Z8719 Personal history of other diseases of the digestive system: Secondary | ICD-10-CM | POA: Insufficient documentation

## 2014-06-10 DIAGNOSIS — Z8659 Personal history of other mental and behavioral disorders: Secondary | ICD-10-CM | POA: Insufficient documentation

## 2014-06-10 DIAGNOSIS — R1012 Left upper quadrant pain: Secondary | ICD-10-CM | POA: Insufficient documentation

## 2014-06-10 DIAGNOSIS — G8929 Other chronic pain: Secondary | ICD-10-CM | POA: Diagnosis not present

## 2014-06-10 DIAGNOSIS — Z8739 Personal history of other diseases of the musculoskeletal system and connective tissue: Secondary | ICD-10-CM | POA: Insufficient documentation

## 2014-06-10 DIAGNOSIS — R109 Unspecified abdominal pain: Secondary | ICD-10-CM

## 2014-06-10 DIAGNOSIS — Z79899 Other long term (current) drug therapy: Secondary | ICD-10-CM | POA: Insufficient documentation

## 2014-06-10 LAB — COMPREHENSIVE METABOLIC PANEL
ALT: 11 U/L (ref 0–53)
ANION GAP: 15 (ref 5–15)
AST: 17 U/L (ref 0–37)
Albumin: 3.2 g/dL — ABNORMAL LOW (ref 3.5–5.2)
Alkaline Phosphatase: 116 U/L (ref 39–117)
BUN: 13 mg/dL (ref 6–23)
CO2: 22 meq/L (ref 19–32)
CREATININE: 0.89 mg/dL (ref 0.50–1.35)
Calcium: 9.4 mg/dL (ref 8.4–10.5)
Chloride: 102 mEq/L (ref 96–112)
GFR calc Af Amer: >90 mL/min (ref >90)
Glucose, Bld: 94 mg/dL (ref 70–99)
Potassium: 3.5 mEq/L — ABNORMAL LOW (ref 3.7–5.3)
Sodium: 139 mEq/L (ref 137–147)
Total Protein: 6.7 g/dL (ref 6.0–8.3)

## 2014-06-10 LAB — CBC WITH DIFFERENTIAL/PLATELET
Basophils Absolute: 0 10*3/uL (ref 0.0–0.1)
Basophils Relative: 0 % (ref 0–1)
EOS PCT: 2 % (ref 0–5)
Eosinophils Absolute: 0.3 10*3/uL (ref 0.0–0.7)
HEMATOCRIT: 35.8 % — AB (ref 39.0–52.0)
Hemoglobin: 11.7 g/dL — ABNORMAL LOW (ref 13.0–17.0)
LYMPHS ABS: 2 10*3/uL (ref 0.7–4.0)
Lymphocytes Relative: 15 % (ref 12–46)
MCH: 29 pg (ref 26.0–34.0)
MCHC: 32.7 g/dL (ref 30.0–36.0)
MCV: 88.8 fL (ref 78.0–100.0)
MONOS PCT: 9 % (ref 3–12)
Monocytes Absolute: 1.2 10*3/uL — ABNORMAL HIGH (ref 0.1–1.0)
NEUTROS ABS: 9.6 10*3/uL — AB (ref 1.7–7.7)
Neutrophils Relative %: 74 % (ref 43–77)
Platelets: 265 10*3/uL (ref 150–400)
RBC: 4.03 MIL/uL — AB (ref 4.22–5.81)
RDW: 16 % — ABNORMAL HIGH (ref 11.5–15.5)
WBC: 13.1 10*3/uL — AB (ref 4.0–10.5)

## 2014-06-10 LAB — LIPASE, BLOOD: Lipase: 60 U/L — ABNORMAL HIGH (ref 11–59)

## 2014-06-10 MED ORDER — OXYCODONE-ACETAMINOPHEN 5-325 MG PO TABS: 1.0000 | ORAL_TABLET | ORAL | Status: DC | PRN

## 2014-06-10 MED ORDER — SODIUM CHLORIDE 0.9 % IV BOLUS (SEPSIS)
1000.0000 mL | Freq: Once | INTRAVENOUS | Status: AC
  Administered 2014-06-10: 1000 mL via INTRAVENOUS

## 2014-06-10 MED ORDER — ONDANSETRON HCL 4 MG/2ML IJ SOLN
4.0000 mg | Freq: Once | INTRAMUSCULAR | Status: AC
  Administered 2014-06-10: 4 mg via INTRAVENOUS
  Filled 2014-06-10: qty 2

## 2014-06-10 MED ORDER — HYDROMORPHONE HCL PF 1 MG/ML IJ SOLN
1.0000 mg | Freq: Once | INTRAMUSCULAR | Status: AC
  Administered 2014-06-10: 1 mg via INTRAVENOUS
  Filled 2014-06-10: qty 1

## 2014-06-10 NOTE — ED Provider Notes (Signed)
Medical screening examination/treatment/procedure(s) were performed by non-physician practitioner and as supervising physician I was immediately available for consultation/collaboration.   EKG Interpretation None       Kalman Drape, MD 06/10/14 [phone]

## 2014-06-10 NOTE — ED Notes (Signed)
Pt escorted to discharge window. Pt verbalized understanding discharge instructions. In no acute distress.  

## 2014-06-10 NOTE — ED Provider Notes (Signed)
CSN: [id number]     Arrival date & time 06/10/14  5361 History   First MD Initiated Contact with Patient 06/10/14 [phone]     Chief Complaint  Patient presents with  . Abdominal Pain     (Consider location/radiation/quality/duration/timing/severity/associated sxs/prior Treatment) Patient is a 53 y.o. male presenting with abdominal pain. The history is provided by the patient. No language interpreter was used.  Abdominal Pain Pain location:  LUQ Pain quality: aching, cramping and sharp   Associated symptoms: nausea and vomiting   Associated symptoms: no chest pain, no cough, no diarrhea, no fever and no shortness of breath   Associated symptoms comment:  He reports history of chronic pancreatitis with similar pain tonight, associated with nausea and vomiting non-bloody emesis. No fever. He is followed by the [facility] and reports multiple abdominal surgeries. He continues to pass gas but reports constipation. No SOB, or CP.   Past Medical History  Diagnosis Date  . Chest pain   . Chronic pancreatitis   . 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 Migraine   . Seizure disorder     x 7 years  . Zollinger-Ellison syndrome   . GI bleed   . Chronic pain   . Migraine headache   . Chronic neck pain   . Pain management contract agreement   . Depression   . Seizures    Past Surgical History  Procedure Laterality Date  . Partial gastrectomy    . Eye surgery    . Hernia repair    . Small intestine surgery    . Appendectomy      per CT 2012- s/p appendectomy   Family History  Problem Relation Age of Onset  . Angina Mother   . Nephrolithiasis Father    History  Substance Use Topics  . Smoking status: Current Every Day Smoker -- 2.00 packs/day    Types: Cigarettes    Last Attempt to Quit: 07/29/2000  . Smokeless tobacco: Not on file     Comment: about 2 packs a day  . Alcohol Use: No    Review of Systems  Constitutional: Negative for fever.  Respiratory: Negative for  cough and shortness of breath.   Cardiovascular: Negative for chest pain.  Gastrointestinal: Positive for nausea, vomiting and abdominal pain. Negative for diarrhea.  Musculoskeletal: Negative for myalgias.      Allergies  Iohexol and Sonata  Home Medications   Prior to Admission medications   Medication Sig Start Date End Date Taking? Authorizing Provider  omeprazole (PRILOSEC) 20 MG capsule Take 20 mg by mouth 2 (two) times daily.   Yes Historical Provider, MD  ondansetron (ZOFRAN) 4 MG tablet Take 1 tablet (4 mg total) by mouth every 6 (six) hours. 04/19/14  Yes Larene Pickett, PA-C   BP 134/92  Pulse 89  Temp(Src) 98.7 F (37.1 C) (Oral)  Resp 20  Ht 5\' 5"  (1.651 m)  Wt 103 lb (46.72 kg)  BMI 17.14 kg/m2  SpO2 99% Physical Exam  Constitutional: He is oriented to person, place, and time. He appears well-developed and well-nourished.  HENT:  Head: Normocephalic.  Neck: Normal range of motion. Neck supple.  Cardiovascular: Normal rate and regular rhythm.   Pulmonary/Chest: Effort normal and breath sounds normal.  Abdominal: Soft. Bowel sounds are normal. There is tenderness. There is no rebound and no guarding.  Well healed midline abdominal surgical scar. Abdomen is soft with normoactive bowel sounds. LUQ tenderness with mild  guarding.   Musculoskeletal: Normal range of motion.  Neurological: He is alert and oriented to person, place, and time.  Skin: Skin is warm and dry. No rash noted.  Psychiatric: He has a normal mood and affect.    ED Course  Procedures (including critical care time) Labs Review Labs Reviewed  CBC WITH DIFFERENTIAL  COMPREHENSIVE METABOLIC PANEL  LIPASE, BLOOD   Results for orders placed during the hospital encounter of 06/10/14  CBC WITH DIFFERENTIAL      Result Value Ref Range   WBC 13.1 (*) 4.0 - 10.5 K/uL   RBC 4.03 (*) 4.22 - 5.81 MIL/uL   Hemoglobin 11.7 (*) 13.0 - 17.0 g/dL   HCT 35.8 (*) 39.0 - 52.0 %   MCV 88.8  78.0 - 100.0 fL    MCH 29.0  26.0 - 34.0 pg   MCHC 32.7  30.0 - 36.0 g/dL   RDW 16.0 (*) 11.5 - 15.5 %   Platelets 265  150 - 400 K/uL   Neutrophils Relative % 74  43 - 77 %   Lymphocytes Relative 15  12 - 46 %   Monocytes Relative 9  3 - 12 %   Eosinophils Relative 2  0 - 5 %   Basophils Relative 0  0 - 1 %   Neutro Abs 9.6 (*) 1.7 - 7.7 K/uL   Lymphs Abs 2.0  0.7 - 4.0 K/uL   Monocytes Absolute 1.2 (*) 0.1 - 1.0 K/uL   Eosinophils Absolute 0.3  0.0 - 0.7 K/uL   Basophils Absolute 0.0  0.0 - 0.1 K/uL   WBC Morphology VACUOLATED NEUTROPHILS    COMPREHENSIVE METABOLIC PANEL      Result Value Ref Range   Sodium 139  137 - 147 mEq/L   Potassium 3.5 (*) 3.7 - 5.3 mEq/L   Chloride 102  96 - 112 mEq/L   CO2 22  19 - 32 mEq/L   Glucose, Bld 94  70 - 99 mg/dL   BUN 13  6 - 23 mg/dL   Creatinine, Ser 0.89  0.50 - 1.35 mg/dL   Calcium 9.4  8.4 - 10.5 mg/dL   Total Protein 6.7  6.0 - 8.3 g/dL   Albumin 3.2 (*) 3.5 - 5.2 g/dL   AST 17  0 - 37 U/L   ALT 11  0 - 53 U/L   Alkaline Phosphatase 116  39 - 117 U/L   Total Bilirubin <0.2 (*) 0.3 - 1.2 mg/dL   GFR calc non Af Amer >90  >90 mL/min   GFR calc Af Amer >90  >90 mL/min   Anion gap 15  5 - 15  LIPASE, BLOOD      Result Value Ref Range   Lipase 60 (*) 11 - 59 U/L    Imaging Review No results found.   EKG Interpretation None      MDM   Final diagnoses:  None    1. Chronic abdominal pain  Re-eval:  Pain is better but still significant. Nausea resolved. VSS. Will continue to observe.    Dewaine Oats, PA-C 06/10/14 [phone]

## 2014-06-10 NOTE — ED Provider Notes (Signed)
Pt received in sign out from PA Upstill at shift change.  53 y.o. M with chronic abdominal pain/pancreatitis, here for acute exacerbation of pain associated with nausea and vomiting.  Followed by [facility].  Labs were obtained-- slight leukocytosis and elevation of lipase.  Plan:  Pain control.  D/c home when comfortable.  Results for orders placed during the hospital encounter of 06/10/14  CBC WITH DIFFERENTIAL      Result Value Ref Range   WBC 13.1 (*) 4.0 - 10.5 K/uL   RBC 4.03 (*) 4.22 - 5.81 MIL/uL   Hemoglobin 11.7 (*) 13.0 - 17.0 g/dL   HCT 35.8 (*) 39.0 - 52.0 %   MCV 88.8  78.0 - 100.0 fL   MCH 29.0  26.0 - 34.0 pg   MCHC 32.7  30.0 - 36.0 g/dL   RDW 16.0 (*) 11.5 - 15.5 %   Platelets 265  150 - 400 K/uL   Neutrophils Relative % 74  43 - 77 %   Lymphocytes Relative 15  12 - 46 %   Monocytes Relative 9  3 - 12 %   Eosinophils Relative 2  0 - 5 %   Basophils Relative 0  0 - 1 %   Neutro Abs 9.6 (*) 1.7 - 7.7 K/uL   Lymphs Abs 2.0  0.7 - 4.0 K/uL   Monocytes Absolute 1.2 (*) 0.1 - 1.0 K/uL   Eosinophils Absolute 0.3  0.0 - 0.7 K/uL   Basophils Absolute 0.0  0.0 - 0.1 K/uL   WBC Morphology VACUOLATED NEUTROPHILS    COMPREHENSIVE METABOLIC PANEL      Result Value Ref Range   Sodium 139  137 - 147 mEq/L   Potassium 3.5 (*) 3.7 - 5.3 mEq/L   Chloride 102  96 - 112 mEq/L   CO2 22  19 - 32 mEq/L   Glucose, Bld 94  70 - 99 mg/dL   BUN 13  6 - 23 mg/dL   Creatinine, Ser 0.89  0.50 - 1.35 mg/dL   Calcium 9.4  8.4 - 10.5 mg/dL   Total Protein 6.7  6.0 - 8.3 g/dL   Albumin 3.2 (*) 3.5 - 5.2 g/dL   AST 17  0 - 37 U/L   ALT 11  0 - 53 U/L   Alkaline Phosphatase 116  39 - 117 U/L   Total Bilirubin <0.2 (*) 0.3 - 1.2 mg/dL   GFR calc non Af Amer >90  >90 mL/min   GFR calc Af Amer >90  >90 mL/min   Anion gap 15  5 - 15  LIPASE, BLOOD      Result Value Ref Range   Lipase 60 (*) 11 - 59 U/L   No results found.  7:18 AM After 3rd round of pain meds, pt states pain is  tolerable, feels can be managed at home.  Will d/c home with short supply percocet, will continue home zofran.  [facility]  Discussed plan with patient, he/she acknowledged understanding and agreed with plan of care.  Return precautions given for new or worsening symptoms.  Larene Pickett, PA-C 06/10/14 [phone]

## 2014-06-10 NOTE — ED Notes (Signed)
Pt complains of abd pain for three days and vomiting for two days, pt has hx of pancreatitis

## 2014-06-10 NOTE — ED Notes (Signed)
Pt alert and oriented x4. Respirations even and unlabored, bilateral symmetrical rise and fall of chest. Skin warm and dry. In no acute distress. Denies needs.   

## 2014-06-10 NOTE — Discharge Instructions (Signed)
Take the prescribed medication as directed.  Continue your zofran as needed. [facility]. Return to the ED for new or worsening symptoms.

## 2014-06-11 NOTE — ED Provider Notes (Signed)
Medical screening examination/treatment/procedure(s) were performed by non-physician practitioner and as supervising physician I was immediately available for consultation/collaboration.   EKG Interpretation None       Kalman Drape, MD 06/11/14 [phone]

## 2014-07-11 ENCOUNTER — Encounter (HOSPITAL_COMMUNITY): Payer: Self-pay | Admitting: Emergency Medicine

## 2014-07-11 ENCOUNTER — Emergency Department (HOSPITAL_COMMUNITY): Payer: Medicare Other

## 2014-07-11 ENCOUNTER — Emergency Department (HOSPITAL_COMMUNITY)
Admission: EM | Admit: 2014-07-11 | Discharge: 2014-07-11 | Disposition: A | Discharge status: Home / Self Care | Payer: Medicare Other | Attending: Emergency Medicine | Admitting: Emergency Medicine

## 2014-07-11 DIAGNOSIS — Z9089 Acquired absence of other organs: Secondary | ICD-10-CM | POA: Diagnosis not present

## 2014-07-11 DIAGNOSIS — R109 Unspecified abdominal pain: Secondary | ICD-10-CM | POA: Insufficient documentation

## 2014-07-11 DIAGNOSIS — R000 Tachycardia, unspecified: Secondary | ICD-10-CM | POA: Diagnosis not present

## 2014-07-11 DIAGNOSIS — F17200 Nicotine dependence, unspecified, uncomplicated: Secondary | ICD-10-CM | POA: Diagnosis not present

## 2014-07-11 DIAGNOSIS — Z8719 Personal history of other diseases of the digestive system: Secondary | ICD-10-CM | POA: Diagnosis not present

## 2014-07-11 DIAGNOSIS — G8929 Other chronic pain: Secondary | ICD-10-CM | POA: Diagnosis not present

## 2014-07-11 DIAGNOSIS — Z8659 Personal history of other mental and behavioral disorders: Secondary | ICD-10-CM | POA: Insufficient documentation

## 2014-07-11 DIAGNOSIS — Z862 Personal history of diseases of the blood and blood-forming organs and certain disorders involving the immune mechanism: Secondary | ICD-10-CM | POA: Diagnosis not present

## 2014-07-11 DIAGNOSIS — Z8679 Personal history of other diseases of the circulatory system: Secondary | ICD-10-CM | POA: Diagnosis not present

## 2014-07-11 DIAGNOSIS — Z8739 Personal history of other diseases of the musculoskeletal system and connective tissue: Secondary | ICD-10-CM | POA: Insufficient documentation

## 2014-07-11 DIAGNOSIS — Z4789 Encounter for other orthopedic aftercare: Secondary | ICD-10-CM | POA: Insufficient documentation

## 2014-07-11 DIAGNOSIS — Z8669 Personal history of other diseases of the nervous system and sense organs: Secondary | ICD-10-CM | POA: Insufficient documentation

## 2014-07-11 DIAGNOSIS — R1013 Epigastric pain: Secondary | ICD-10-CM | POA: Diagnosis not present

## 2014-07-11 DIAGNOSIS — R112 Nausea with vomiting, unspecified: Secondary | ICD-10-CM

## 2014-07-11 DIAGNOSIS — Z8639 Personal history of other endocrine, nutritional and metabolic disease: Secondary | ICD-10-CM | POA: Insufficient documentation

## 2014-07-11 LAB — CBC WITH DIFFERENTIAL/PLATELET
Basophils Absolute: 0 10*3/uL (ref 0.0–0.1)
Basophils Relative: 0 % (ref 0–1)
Eosinophils Absolute: 0.5 10*3/uL (ref 0.0–0.7)
Eosinophils Relative: 3 % (ref 0–5)
HEMATOCRIT: 44.3 % (ref 39.0–52.0)
Hemoglobin: 15 g/dL (ref 13.0–17.0)
LYMPHS PCT: 16 % (ref 12–46)
Lymphs Abs: 2.7 10*3/uL (ref 0.7–4.0)
MCH: 30.2 pg (ref 26.0–34.0)
MCHC: 33.9 g/dL (ref 30.0–36.0)
MCV: 89.1 fL (ref 78.0–100.0)
Monocytes Absolute: 1.3 10*3/uL — ABNORMAL HIGH (ref 0.1–1.0)
Monocytes Relative: 8 % (ref 3–12)
NEUTROS ABS: 12.1 10*3/uL — AB (ref 1.7–7.7)
Neutrophils Relative %: 73 % (ref 43–77)
Platelets: 373 10*3/uL (ref 150–400)
RBC: 4.97 MIL/uL (ref 4.22–5.81)
RDW: 15.4 % (ref 11.5–15.5)
WBC: 16.6 10*3/uL — ABNORMAL HIGH (ref 4.0–10.5)

## 2014-07-11 LAB — I-STAT TROPONIN, ED: Troponin i, poc: 0 ng/mL (ref 0.00–0.08)

## 2014-07-11 LAB — COMPREHENSIVE METABOLIC PANEL
ALT: 15 U/L (ref 0–53)
ANION GAP: 15 (ref 5–15)
AST: 34 U/L (ref 0–37)
Albumin: 3.7 g/dL (ref 3.5–5.2)
Alkaline Phosphatase: 118 U/L — ABNORMAL HIGH (ref 39–117)
BUN: 12 mg/dL (ref 6–23)
CALCIUM: 9.8 mg/dL (ref 8.4–10.5)
CO2: 23 mEq/L (ref 19–32)
CREATININE: 0.49 mg/dL — AB (ref 0.50–1.35)
Chloride: 99 mEq/L (ref 96–112)
GLUCOSE: 112 mg/dL — AB (ref 70–99)
Potassium: 4.3 mEq/L (ref 3.7–5.3)
Sodium: 137 mEq/L (ref 137–147)
Total Bilirubin: 0.3 mg/dL (ref 0.3–1.2)
Total Protein: 8.3 g/dL (ref 6.0–8.3)

## 2014-07-11 LAB — LIPASE, BLOOD: LIPASE: 24 U/L (ref 11–59)

## 2014-07-11 MED ORDER — SODIUM CHLORIDE 0.9 % IV BOLUS (SEPSIS)
500.0000 mL | Freq: Once | INTRAVENOUS | Status: AC
  Administered 2014-07-11: 500 mL via INTRAVENOUS

## 2014-07-11 MED ORDER — FENTANYL CITRATE 0.05 MG/ML IJ SOLN
100.0000 ug | Freq: Once | INTRAMUSCULAR | Status: AC
  Administered 2014-07-11: 100 ug via INTRAVENOUS
  Filled 2014-07-11: qty 2

## 2014-07-11 MED ORDER — HYDROMORPHONE HCL PF 1 MG/ML IJ SOLN
1.0000 mg | Freq: Once | INTRAMUSCULAR | Status: AC
  Administered 2014-07-11: 1 mg via INTRAVENOUS
  Filled 2014-07-11: qty 1

## 2014-07-11 MED ORDER — ONDANSETRON HCL 4 MG PO TABS: 4.0000 mg | ORAL_TABLET | Freq: Four times a day (QID) | ORAL | Status: DC

## 2014-07-11 MED ORDER — SODIUM CHLORIDE 0.9 % IV BOLUS (SEPSIS)
1000.0000 mL | Freq: Once | INTRAVENOUS | Status: AC
  Administered 2014-07-11: 1000 mL via INTRAVENOUS

## 2014-07-11 MED ORDER — ONDANSETRON HCL 4 MG/2ML IJ SOLN
4.0000 mg | Freq: Once | INTRAMUSCULAR | Status: AC
  Administered 2014-07-11: 4 mg via INTRAVENOUS
  Filled 2014-07-11: qty 2

## 2014-07-11 MED ORDER — HYDROMORPHONE HCL PF 1 MG/ML IJ SOLN
1.0000 mg | Freq: Once | INTRAMUSCULAR | Status: AC
Start: 2014-07-11 — End: 2014-07-11
  Administered 2014-07-11: 1 mg via INTRAVENOUS
  Filled 2014-07-11: qty 1

## 2014-07-11 NOTE — ED Notes (Signed)
Pt arrived to the ED with a complaint of abdominal pain.  Pt has had multiple abdominal surgeries.  [facility] is a [facility] patient.  Pt states in the last 24 hours he has had episodes of emesis twice and hour.  Pt has abdominal pain that is extreme.

## 2014-07-11 NOTE — Discharge Instructions (Signed)
Please follow up closely with your doctor for further evaluation of your symptoms.  Take zofran as needed for nausea.   Nausea and Vomiting Nausea is a sick feeling that often comes before throwing up (vomiting). Vomiting is a reflex where stomach contents come out of your mouth. Vomiting can cause severe loss of body fluids (dehydration). Children and elderly adults can become dehydrated quickly, especially if they also have diarrhea. Nausea and vomiting are symptoms of a condition or disease. It is important to find the cause of your symptoms. CAUSES   Direct irritation of the stomach lining. This irritation can result from increased acid production (gastroesophageal reflux disease), infection, food poisoning, taking certain medicines (such as nonsteroidal anti-inflammatory drugs), alcohol use, or tobacco use.  Signals from the brain.These signals could be caused by a headache, heat exposure, an inner ear disturbance, increased pressure in the brain from injury, infection, a tumor, or a concussion, pain, emotional stimulus, or metabolic problems.  An obstruction in the gastrointestinal tract (bowel obstruction).  Illnesses such as diabetes, hepatitis, gallbladder problems, appendicitis, kidney problems, cancer, sepsis, atypical symptoms of a heart attack, or eating disorders.  Medical treatments such as chemotherapy and radiation.  Receiving medicine that makes you sleep (general anesthetic) during surgery. DIAGNOSIS Your caregiver may ask for tests to be done if the problems do not improve after a few days. Tests may also be done if symptoms are severe or if the reason for the nausea and vomiting is not clear. Tests may include:  Urine tests.  Blood tests.  Stool tests.  Cultures (to look for evidence of infection).  X-rays or other imaging studies. Test results can help your caregiver make decisions about treatment or the need for additional tests. TREATMENT You need to stay well  hydrated. Drink frequently but in small amounts.You may wish to drink water, sports drinks, clear broth, or eat frozen ice pops or gelatin dessert to help stay hydrated.When you eat, eating slowly may help prevent nausea.There are also some antinausea medicines that may help prevent nausea. HOME CARE INSTRUCTIONS   Take all medicine as directed by your caregiver.  If you do not have an appetite, do not force yourself to eat. However, you must continue to drink fluids.  If you have an appetite, eat a normal diet unless your caregiver tells you differently.  Eat a variety of complex carbohydrates (rice, wheat, potatoes, bread), lean meats, yogurt, fruits, and vegetables.  Avoid high-fat foods because they are more difficult to digest.  Drink enough water and fluids to keep your urine clear or pale yellow.  If you are dehydrated, ask your caregiver for specific rehydration instructions. Signs of dehydration may include:  Severe thirst.  Dry lips and mouth.  Dizziness.  Dark urine.  Decreasing urine frequency and amount.  Confusion.  Rapid breathing or pulse. SEEK IMMEDIATE MEDICAL CARE IF:   You have blood or brown flecks (like coffee grounds) in your vomit.  You have black or bloody stools.  You have a severe headache or stiff neck.  You are confused.  You have severe abdominal pain.  You have chest pain or trouble breathing.  You do not urinate at least once every 8 hours.  You develop cold or clammy skin.  You continue to vomit for longer than 24 to 48 hours.  You have a fever. MAKE SURE YOU:   Understand these instructions.  Will watch your condition.  Will get help right away if you are not doing well or  get worse. Document Released: 11/18/2005 Document Revised: 02/10/2012 Document Reviewed: 04/17/2011 [facility] Patient Information 2015 [place], [place]. This information is not intended to replace advice given to you by your health care provider. Make  sure you discuss any questions you have with your health care provider.  Abdominal Pain Many things can cause belly (abdominal) pain. Most times, the belly pain is not dangerous. Many cases of belly pain can be watched and treated at home. HOME CARE   Do not take medicines that help you go poop (laxatives) unless told to by your doctor.  Only take medicine as told by your doctor.  Eat or drink as told by your doctor. Your doctor will tell you if you should be on a special diet. GET HELP IF:  You do not know what is causing your belly pain.  You have belly pain while you are sick to your stomach (nauseous) or have runny poop (diarrhea).  You have pain while you pee or poop.  Your belly pain wakes you up at night.  You have belly pain that gets worse or better when you eat.  You have belly pain that gets worse when you eat fatty foods.  You have a fever. GET HELP RIGHT AWAY IF:   The pain does not go away within 2 hours.  You keep throwing up (vomiting).  The pain changes and is only in the right or left part of the belly.  You have bloody or tarry looking poop. MAKE SURE YOU:   Understand these instructions.  Will watch your condition.  Will get help right away if you are not doing well or get worse. Document Released: 05/06/2008 Document Revised: 11/23/2013 Document Reviewed: 07/28/2013 [facility] Patient Information 2015 [place], [place]. This information is not intended to replace advice given to you by your health care provider. Make sure you discuss any questions you have with your health care provider.

## 2014-07-11 NOTE — ED Provider Notes (Signed)
CSN: [id number]     Arrival date & time 07/11/14  0059 History   First MD Initiated Contact with Patient 07/11/14 0112     Chief Complaint  Patient presents with  . Abdominal Pain     (Consider location/radiation/quality/duration/timing/severity/associated sxs/prior Treatment) HPI  53 year old male with history of chronic pancreatitis, chronic pain, GI bleed, Zollinger-Ellison syndrome who presents complaining of abdominal pain. Patient reports since yesterday he has been having epigastric pain which radiates to his back. Pain is described as 8 to an achy sensation, persistent, with associate nausea and vomiting. His vomits once or twice every hour. Vomitus is nonbloody nonbilious. He is able to pass flatus her last bowel movement was 2 days ago. Pain feels similar to his chronic pancreatitis. He has had multiple abdominal surgery and has history of small bowel structure in the past. He denies fever but endorsed chills. He denies rhinorrhea, sneezing, cough, chest pain, shortness of breath, dysuria, rectal bleeding, hematochezia or melena. Prior history of alcohol abuse but has been free of alcohol for the past 18 years. No history of diabetes. He has had multiple abdominal surgeries including partial gastrectomy, hernia repair, small intestine surgery, appendectomy. At this time patient endorse feeling dehydrated.  Past Medical History  Diagnosis Date  . Chest pain   . Chronic pancreatitis   . 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 Migraine   . Seizure disorder     x 7 years  . Zollinger-Ellison syndrome   . GI bleed   . Chronic pain   . Migraine headache   . Chronic neck pain   . Pain management contract agreement   . Depression   . Seizures    Past Surgical History  Procedure Laterality Date  . Partial gastrectomy    . Eye surgery    . Hernia repair    . Small intestine surgery    . Appendectomy      per CT 2012- s/p appendectomy   Family History  Problem Relation Age  of Onset  . Angina Mother   . Nephrolithiasis Father    History  Substance Use Topics  . Smoking status: Current Every Day Smoker -- 2.00 packs/day    Types: Cigarettes    Last Attempt to Quit: 07/29/2000  . Smokeless tobacco: Not on file     Comment: about 2 packs a day  . Alcohol Use: No    Review of Systems  All other systems reviewed and are negative.     Allergies  Iohexol and Sonata  Home Medications   Prior to Admission medications   Medication Sig Start Date End Date Taking? Authorizing Provider  omeprazole (PRILOSEC) 20 MG capsule Take 20 mg by mouth 2 (two) times daily.    Historical Provider, MD  ondansetron (ZOFRAN) 4 MG tablet Take 1 tablet (4 mg total) by mouth every 6 (six) hours. 04/19/14   Larene Pickett, PA-C  oxyCODONE-acetaminophen (PERCOCET/ROXICET) 5-325 MG per tablet Take 1 tablet by mouth every 4 (four) hours as needed. 06/10/14   Larene Pickett, PA-C   BP 129/85  Pulse 105  Temp(Src) 98.7 F (37.1 C) (Oral)  Resp 20  Wt 105 lb (47.628 kg)  SpO2 99% Physical Exam  Constitutional: He appears well-developed and well-nourished. No distress.  Thin appearing caucasian male who is older than stated age but appears to be in no acute distress, nontoxic in appearance.  HENT:  Head: Atraumatic.  Mouth/Throat: Oropharynx is clear and  moist.  Eyes: Conjunctivae are normal.  Neck: Normal range of motion. Neck supple.  Cardiovascular:  Mild tachycardia without M/R/G  Pulmonary/Chest: Effort normal and breath sounds normal. No respiratory distress. He has no wheezes. He exhibits no tenderness.  Abdominal: Soft. There is tenderness (Epigastric tenderness to palpation without guarding or rebound tenderness. Abdomen otherwise soft, nondistended, with bowel sounds all 4 quadrants). There is no rebound and no guarding.  Well-healing midline abdominal surgical scar. No hernia noted.  Genitourinary:  Normal rectal tone, no fecal impaction, no mass. Normal color  stool  Neurological: He is alert.  Skin: No rash noted.  Psychiatric: He has a normal mood and affect.    ED Course  Procedures (including critical care time)  Patient with acute on chronic abdominal pain with nausea vomiting that felt similar to prior chronic pancreatitis. Giving history of multiple abdominal surgery, will obtain acute abdominal series to rule out small bowel obstruction. work up initiated.  3:52 AM Leukocytosis with WBC 16.6, no left shift.  Acute abx series without acute changes. ECG and trop I are unremarkable.  Pt still endorse R side abd pain despite receiving 2mg  of dilaudid.  Given his extensive medical hx of multiple prior abd surgery, i will obtain abd/pelvis CT w CM for further evaluation.  Care discussed with Dr. Dina Rich.  5:43 AM Report given to oncoming provider, who will f/u on abd/pelvis CT result and will reassess pt.  If sxs improves, and CT scan without acute finding, then pt may be stable for discharge with antiemetic and close f/u with PCP.    Labs Review Labs Reviewed  CBC WITH DIFFERENTIAL - Abnormal; Notable for the following:    WBC 16.6 (*)    Neutro Abs 12.1 (*)    Monocytes Absolute 1.3 (*)    All other components within normal limits  COMPREHENSIVE METABOLIC PANEL - Abnormal; Notable for the following:    Glucose, Bld 112 (*)    Creatinine, Ser 0.49 (*)    Alkaline Phosphatase 118 (*)    All other components within normal limits  LIPASE, BLOOD  I-STAT TROPOININ, ED    Imaging Review Dg Abd Acute W/chest  07/11/2014   CLINICAL DATA:  Evaluate for small bowel obstruction  EXAM: ACUTE ABDOMEN SERIES (ABDOMEN 2 VIEW & CHEST 1 VIEW)  COMPARISON:  Prior radiograph from 04/19/2014  FINDINGS: The cardiac and mediastinal silhouettes are stable in size and contour, and remain within normal limits.  The lungs are normally inflated. No airspace consolidation, pleural effusion, or pulmonary edema is identified. There is no pneumothorax.  Surgical  clips overlie the GE junction.  A few nondilated gas-filled loops of bowel are seen scattered throughout the abdomen. No evidence of obstruction or ileus. No soft tissue mass or abnormal calcification. Suture material present within the left abdomen. Metallic clip present within the left upper quadrant. No abnormal bowel wall thickening. No free intraperitoneal air.  No acute osseous abnormality.  IMPRESSION: 1. Nonobstructive bowel gas pattern with no radiographic evidence of acute intra-abdominal process. 2. No acute cardiopulmonary abnormality.   Electronically Signed   By: Jeannine Boga M.D.   On: 07/11/2014 03:29     EKG Interpretation None      Date: 07/11/2014  Rate: 97  Rhythm: normal sinus rhythm  QRS Axis: normal  Intervals: normal  ST/T Wave abnormalities: normal  Conduction Disutrbances: none  Narrative Interpretation:   Old EKG Reviewed: No significant changes noted     MDM   Final diagnoses:  Chronic abdominal pain  Nausea and vomiting, vomiting of unspecified type    BP 128/80  Pulse 76  Temp(Src) 98.9 F (37.2 C) (Oral)  Resp 18  Wt 105 lb (47.628 kg)  SpO2 99%  I have reviewed nursing notes and vital signs. I personally reviewed the imaging tests through PACS system  I reviewed available ER/hospitalization records thought the EMR     Domenic Moras, PA-C 07/13/14 1501

## 2014-07-11 NOTE — ED Provider Notes (Signed)
Medical screening examination/treatment/procedure(s) were performed by non-physician practitioner and as supervising physician I was immediately available for consultation/collaboration.   EKG Interpretation None        Merryl Hacker, MD 07/11/14 [phone]

## 2014-07-11 NOTE — ED Provider Notes (Signed)
6:39 AM PT signed out to me at shift change by PA tran pending CT abdomen. Pt with chronic pancreatitis, came in today complaining of increased abdominal pain. Pt was found to have elevated WBC, and CT was ordered. Labs and CT are as reported below.  Results for orders placed during the hospital encounter of 07/11/14  CBC WITH DIFFERENTIAL      Result Value Ref Range   WBC 16.6 (*) 4.0 - 10.5 K/uL   RBC 4.97  4.22 - 5.81 MIL/uL   Hemoglobin 15.0  13.0 - 17.0 g/dL   HCT 44.3  39.0 - 52.0 %   MCV 89.1  78.0 - 100.0 fL   MCH 30.2  26.0 - 34.0 pg   MCHC 33.9  30.0 - 36.0 g/dL   RDW 15.4  11.5 - 15.5 %   Platelets 373  150 - 400 K/uL   Neutrophils Relative % 73  43 - 77 %   Lymphocytes Relative 16  12 - 46 %   Monocytes Relative 8  3 - 12 %   Eosinophils Relative 3  0 - 5 %   Basophils Relative 0  0 - 1 %   Neutro Abs 12.1 (*) 1.7 - 7.7 K/uL   Lymphs Abs 2.7  0.7 - 4.0 K/uL   Monocytes Absolute 1.3 (*) 0.1 - 1.0 K/uL   Eosinophils Absolute 0.5  0.0 - 0.7 K/uL   Basophils Absolute 0.0  0.0 - 0.1 K/uL   RBC Morphology TEARDROP CELLS     WBC Morphology ATYPICAL LYMPHOCYTES     Smear Review LARGE PLATELETS PRESENT    COMPREHENSIVE METABOLIC PANEL      Result Value Ref Range   Sodium 137  137 - 147 mEq/L   Potassium 4.3  3.7 - 5.3 mEq/L   Chloride 99  96 - 112 mEq/L   CO2 23  19 - 32 mEq/L   Glucose, Bld 112 (*) 70 - 99 mg/dL   BUN 12  6 - 23 mg/dL   Creatinine, Ser 0.49 (*) 0.50 - 1.35 mg/dL   Calcium 9.8  8.4 - 10.5 mg/dL   Total Protein 8.3  6.0 - 8.3 g/dL   Albumin 3.7  3.5 - 5.2 g/dL   AST 34  0 - 37 U/L   ALT 15  0 - 53 U/L   Alkaline Phosphatase 118 (*) 39 - 117 U/L   Total Bilirubin 0.3  0.3 - 1.2 mg/dL   GFR calc non Af Amer >90  >90 mL/min   GFR calc Af Amer >90  >90 mL/min   Anion gap 15  5 - 15  LIPASE, BLOOD      Result Value Ref Range   Lipase 24  11 - 59 U/L  I-STAT TROPOININ, ED      Result Value Ref Range   Troponin i, poc 0.00  0.00 - 0.08 ng/mL   Comment 3             Ct Abdomen Pelvis Wo Contrast  07/11/2014   CLINICAL DATA:  Abdominal pain, nausea, vomiting.  Leukocytosis.  EXAM: CT ABDOMEN AND PELVIS WITHOUT CONTRAST  TECHNIQUE: Multidetector CT imaging of the abdomen and pelvis was performed following the standard protocol without IV contrast.  COMPARISON:  Prior CT from 08/30/2013  FINDINGS: Scattered reticular nodular opacities are seen within the medial right lower lobe (series 3, image 6), suggestive of focal endobronchial infection or possibly aspiration mild subsegmental atelectasis seen dependently within the bilateral lower lobes.  A 6 mm subpleural nodule within the peripheral right lower lobe is stable from prior (series 3, image 13). No pleural or pericardial effusion.  Limited noncontrast evaluation of the liver is unremarkable. Gallbladder within normal limits. No biliary dilatation. The spleen, adrenal glands, and pancreas demonstrate a normal unenhanced appearance.  Mild fullness of the renal collecting systems is seen bilaterally, left greater than right. This finding is stable as compared to multiple prior studies. No frank hydronephrosis. Parenchymal scarring with associated calcification present within the lateral aspect of the interpolar right kidney. No nephrolithiasis.  Appendix is surgically absent. No abnormal wall thickening, mucosal enhancement, or inflammatory stranding seen about the bowels.  Bladder within normal limits.  Prostate is unremarkable.  No free air or fluid. No pathologically enlarged intra-abdominal pelvic lymph nodes identified. Moderate atherosclerotic disease present within the intra-abdominal aorta without aneurysm.  No acute osseous abnormality. No worrisome lytic or blastic osseous lesions.  IMPRESSION: 1. No CT evidence of acute intra-abdominal pelvic process identified. 2. Stable postsurgical changes related to prior partial gastrectomy and appendectomy. 3. Scattered reticular nodular opacities within the medial  right lung base, nonspecific, but may reflect sequelae of endobronchial infection or possibly aspiration.   Electronically Signed   By: Jeannine Boga M.D.   On: 07/11/2014 06:12   Dg Abd Acute W/chest  07/11/2014   CLINICAL DATA:  Evaluate for small bowel obstruction  EXAM: ACUTE ABDOMEN SERIES (ABDOMEN 2 VIEW & CHEST 1 VIEW)  COMPARISON:  Prior radiograph from 04/19/2014  FINDINGS: The cardiac and mediastinal silhouettes are stable in size and contour, and remain within normal limits.  The lungs are normally inflated. No airspace consolidation, pleural effusion, or pulmonary edema is identified. There is no pneumothorax.  Surgical clips overlie the GE junction.  A few nondilated gas-filled loops of bowel are seen scattered throughout the abdomen. No evidence of obstruction or ileus. No soft tissue mass or abnormal calcification. Suture material present within the left abdomen. Metallic clip present within the left upper quadrant. No abnormal bowel wall thickening. No free intraperitoneal air.  No acute osseous abnormality.  IMPRESSION: 1. Nonobstructive bowel gas pattern with no radiographic evidence of acute intra-abdominal process. 2. No acute cardiopulmonary abnormality.   Electronically Signed   By: Jeannine Boga M.D.   On: 07/11/2014 03:29    I reassessed pt. CT unremarkable. Nodular opacities in the medial right lung base discussed with pt. Will need close follow up with PCP. Pt's abdomen is soft on exam, non tender at this time. He denies any current n/v. Tolerating POs. Stable for d/c home with outpatient follow up. Papers printed out by Domenic Moras PA-C, pt is to go home with zofran for nausea. Return precautions discussed.   Filed Vitals:   07/11/14 0102 07/11/14 0343 07/11/14 0345 07/11/14 0400  BP: 129/85 98/68 98/68  109/64  Pulse: 105 69 70 68  Temp: 98.7 F (37.1 C) 98.9 F (37.2 C)    TempSrc: Oral Oral    Resp: 20 18    Weight: 105 lb (47.628 kg)     SpO2: 99% 97% 96%  97%     Renold Genta, PA-C 07/11/14 [phone]

## 2014-07-14 NOTE — ED Provider Notes (Signed)
Medical screening examination/treatment/procedure(s) were performed by non-physician practitioner and as supervising physician I was immediately available for consultation/collaboration.   EKG Interpretation   Date/Time:  Monday July 11 2014 01:39:40 EDT Ventricular Rate:  97 PR Interval:  144 QRS Duration: 73 QT Interval:  344 QTC Calculation: 437 R Axis:   67 Text Interpretation:  Sinus rhythm RSR' in V1 or V2, probably normal  variant Baseline wander in lead(s) V4 ED PHYSICIAN INTERPRETATION  AVAILABLE IN [facility] Confirmed by TEST, Record ([id number]) on  07/13/2014 7:36:28 AM        Merryl Hacker, MD 07/14/14 [phone]

## 2014-09-15 ENCOUNTER — Encounter (HOSPITAL_COMMUNITY): Payer: Self-pay | Admitting: Emergency Medicine

## 2014-09-15 ENCOUNTER — Emergency Department (HOSPITAL_COMMUNITY)
Admission: EM | Admit: 2014-09-15 | Discharge: 2014-09-15 | Disposition: A | Discharge status: Home / Self Care | Payer: Medicare Other | Attending: Emergency Medicine | Admitting: Emergency Medicine

## 2014-09-15 ENCOUNTER — Emergency Department (HOSPITAL_COMMUNITY): Payer: Medicare Other

## 2014-09-15 DIAGNOSIS — Z8659 Personal history of other mental and behavioral disorders: Secondary | ICD-10-CM | POA: Insufficient documentation

## 2014-09-15 DIAGNOSIS — R112 Nausea with vomiting, unspecified: Secondary | ICD-10-CM | POA: Diagnosis not present

## 2014-09-15 DIAGNOSIS — Z8669 Personal history of other diseases of the nervous system and sense organs: Secondary | ICD-10-CM | POA: Insufficient documentation

## 2014-09-15 DIAGNOSIS — G8929 Other chronic pain: Secondary | ICD-10-CM | POA: Diagnosis not present

## 2014-09-15 DIAGNOSIS — Z720 Tobacco use: Secondary | ICD-10-CM | POA: Diagnosis not present

## 2014-09-15 DIAGNOSIS — Z8719 Personal history of other diseases of the digestive system: Secondary | ICD-10-CM | POA: Insufficient documentation

## 2014-09-15 DIAGNOSIS — Z9049 Acquired absence of other specified parts of digestive tract: Secondary | ICD-10-CM | POA: Insufficient documentation

## 2014-09-15 DIAGNOSIS — R1084 Generalized abdominal pain: Secondary | ICD-10-CM | POA: Diagnosis present

## 2014-09-15 DIAGNOSIS — Z8739 Personal history of other diseases of the musculoskeletal system and connective tissue: Secondary | ICD-10-CM | POA: Insufficient documentation

## 2014-09-15 DIAGNOSIS — Z8639 Personal history of other endocrine, nutritional and metabolic disease: Secondary | ICD-10-CM | POA: Diagnosis not present

## 2014-09-15 DIAGNOSIS — Z79899 Other long term (current) drug therapy: Secondary | ICD-10-CM | POA: Insufficient documentation

## 2014-09-15 DIAGNOSIS — Z903 Acquired absence of stomach [part of]: Secondary | ICD-10-CM | POA: Insufficient documentation

## 2014-09-15 LAB — COMPREHENSIVE METABOLIC PANEL
ALT: 7 U/L (ref 0–53)
AST: 15 U/L (ref 0–37)
Albumin: 4.1 g/dL (ref 3.5–5.2)
Alkaline Phosphatase: 118 U/L — ABNORMAL HIGH (ref 39–117)
Anion gap: 14 (ref 5–15)
BUN: 17 mg/dL (ref 6–23)
CALCIUM: 10.1 mg/dL (ref 8.4–10.5)
CO2: 23 mEq/L (ref 19–32)
CREATININE: 0.6 mg/dL (ref 0.50–1.35)
Chloride: 100 mEq/L (ref 96–112)
GFR calc Af Amer: >90 mL/min (ref >90)
GFR calc non Af Amer: >90 mL/min (ref >90)
GLUCOSE: 128 mg/dL — AB (ref 70–99)
Potassium: 4.4 mEq/L (ref 3.7–5.3)
Sodium: 137 mEq/L (ref 137–147)
TOTAL PROTEIN: 8.7 g/dL — AB (ref 6.0–8.3)
Total Bilirubin: 0.8 mg/dL (ref 0.3–1.2)

## 2014-09-15 LAB — URINALYSIS, ROUTINE W REFLEX MICROSCOPIC
Bilirubin Urine: NEGATIVE
Glucose, UA: NEGATIVE mg/dL
HGB URINE DIPSTICK: NEGATIVE
KETONES UR: NEGATIVE mg/dL
Leukocytes, UA: NEGATIVE
NITRITE: NEGATIVE
Protein, ur: NEGATIVE mg/dL
Specific Gravity, Urine: 1.023 (ref 1.005–1.030)
Urobilinogen, UA: 0.2 mg/dL (ref 0.0–1.0)
pH: 7 (ref 5.0–8.0)

## 2014-09-15 LAB — CBC WITH DIFFERENTIAL/PLATELET
Basophils Absolute: 0 10*3/uL (ref 0.0–0.1)
Basophils Relative: 0 % (ref 0–1)
EOS PCT: 1 % (ref 0–5)
Eosinophils Absolute: 0.1 10*3/uL (ref 0.0–0.7)
HCT: 44.3 % (ref 39.0–52.0)
Hemoglobin: 15.2 g/dL (ref 13.0–17.0)
LYMPHS ABS: 2.2 10*3/uL (ref 0.7–4.0)
Lymphocytes Relative: 20 % (ref 12–46)
MCH: 30.5 pg (ref 26.0–34.0)
MCHC: 34.3 g/dL (ref 30.0–36.0)
MCV: 89 fL (ref 78.0–100.0)
MONO ABS: 1.2 10*3/uL — AB (ref 0.1–1.0)
MONOS PCT: 11 % (ref 3–12)
Neutro Abs: 7.5 10*3/uL (ref 1.7–7.7)
Neutrophils Relative %: 68 % (ref 43–77)
Platelets: 393 10*3/uL (ref 150–400)
RBC: 4.98 MIL/uL (ref 4.22–5.81)
RDW: 13.6 % (ref 11.5–15.5)
WBC: 11.1 10*3/uL — ABNORMAL HIGH (ref 4.0–10.5)

## 2014-09-15 LAB — LIPASE, BLOOD: Lipase: 34 U/L (ref 11–59)

## 2014-09-15 LAB — I-STAT TROPONIN, ED: TROPONIN I, POC: 0 ng/mL (ref 0.00–0.08)

## 2014-09-15 MED ORDER — HYDROMORPHONE HCL 1 MG/ML IJ SOLN
1.0000 mg | Freq: Once | INTRAMUSCULAR | Status: AC
  Administered 2014-09-15: 1 mg via INTRAVENOUS
  Filled 2014-09-15: qty 1

## 2014-09-15 MED ORDER — OXYCODONE-ACETAMINOPHEN 5-325 MG PO TABS: 2.0000 | ORAL_TABLET | ORAL | Status: DC | PRN

## 2014-09-15 MED ORDER — ONDANSETRON HCL 4 MG/2ML IJ SOLN: 4.0000 mg | INTRAMUSCULAR | Status: DC | PRN

## 2014-09-15 MED ORDER — SODIUM CHLORIDE 0.9 % IV BOLUS (SEPSIS)
1000.0000 mL | Freq: Once | INTRAVENOUS | Status: AC
  Administered 2014-09-15: 1000 mL via INTRAVENOUS

## 2014-09-15 MED ORDER — ONDANSETRON HCL 4 MG/2ML IJ SOLN
4.0000 mg | INTRAMUSCULAR | Status: AC
  Administered 2014-09-15: 4 mg via INTRAVENOUS
  Filled 2014-09-15: qty 2

## 2014-09-15 MED ORDER — ONDANSETRON HCL 4 MG PO TABS: 4.0000 mg | ORAL_TABLET | Freq: Four times a day (QID) | ORAL | Status: DC

## 2014-09-15 MED ORDER — HYDROMORPHONE HCL 1 MG/ML IJ SOLN
1.0000 mg | Freq: Once | INTRAMUSCULAR | Status: AC
Start: 2014-09-15 — End: 2014-09-15
  Administered 2014-09-15: 1 mg via INTRAVENOUS
  Filled 2014-09-15: qty 1

## 2014-09-15 NOTE — Discharge Instructions (Signed)
Return to the emergency room with worsening of symptoms, new symptoms or with symptoms that are concerning, especially sever abdominal pain, nausea and vomiting, unable to keep fluids down.  Follow up with GI physician on 09/23/14 as scheduled.  Zofran for nausea Percocet as needed for severe pain until you get your pain script Monday. Do not operate machinery, drive or drink alcohol while taking narcotics or muscle relaxers.   Abdominal Pain Many things can cause abdominal pain. Usually, abdominal pain is not caused by a disease and will improve without treatment. It can often be observed and treated at home. Your health care provider will do a physical exam and possibly order blood tests and X-rays to help determine the seriousness of your pain. However, in many cases, more time must pass before a clear cause of the pain can be found. Before that point, your health care provider may not know if you need more testing or further treatment. HOME CARE INSTRUCTIONS  Monitor your abdominal pain for any changes. The following actions may help to alleviate any discomfort you are experiencing:  Only take over-the-counter or prescription medicines as directed by your health care provider.  Do not take laxatives unless directed to do so by your health care provider.  Try a clear liquid diet (broth, tea, or water) as directed by your health care provider. Slowly move to a bland diet as tolerated. SEEK MEDICAL CARE IF:  You have unexplained abdominal pain.  You have abdominal pain associated with nausea or diarrhea.  You have pain when you urinate or have a bowel movement.  You experience abdominal pain that wakes you in the night.  You have abdominal pain that is worsened or improved by eating food.  You have abdominal pain that is worsened with eating fatty foods.  You have a fever. SEEK IMMEDIATE MEDICAL CARE IF:   Your pain does not go away within 2 hours.  You keep throwing up  (vomiting).  Your pain is felt only in portions of the abdomen, such as the right side or the left lower portion of the abdomen.  You pass bloody or black tarry stools. MAKE SURE YOU:  Understand these instructions.   Will watch your condition.   Will get help right away if you are not doing well or get worse.  Document Released: 08/28/2005 Document Revised: 11/23/2013 Document Reviewed: 07/28/2013 [facility] Patient Information 2015 [place], [place]. This information is not intended to replace advice given to you by your health care provider. Make sure you discuss any questions you have with your health care provider.

## 2014-09-15 NOTE — ED Notes (Signed)
Pt reports unset of generalized abdominal pain, nausea, vomiting, and diarrhea. Pt reports took laxative. Pt reports to episodes of diarrhea over past 24 hours and "countless" vomiting. Pt hx of pancreatitis.

## 2014-09-15 NOTE — ED Notes (Signed)
Pt request something to eat; PA aware; pt given sandwich. Able to eat with vomiting. Pt request something for pain.

## 2014-09-15 NOTE — ED Notes (Signed)
Pt with Hx of pancreatitis c/o diffuse abdominal pain, emesis x 2 days, dehydration, diarrhea. Pt states his pain prescriptions will not arrive until Monday.

## 2014-09-15 NOTE — ED Provider Notes (Signed)
CSN: [id number]     Arrival date & time 09/15/14  4098 History   First MD Initiated Contact with Patient 09/15/14 [phone]     Chief Complaint  Patient presents with  . Abdominal Pain     (Consider location/radiation/quality/duration/timing/severity/associated sxs/prior Treatment) HPI ARIES Jerry Knight is a 53 y.o. male with PMH of chronic pancreatitis, zollinger-Ellison syndrome, GI bleed, ulcers presenting with diffuse abdominal tenderness with nausea, intractable emesis for two days. Pain is sharp and radiates into the mid to low back. Patient denies blood in stool or emesis. No melena. No diarrhea. Pain is identical to prior episodes of chronic abdominal pain. Patient ran out of pain medications at home. Patient seen by GI physician at the [facility] with last appointment in August. Patient with scheduled appointment 09/23/14 with GI. Patient denies chest pain, endorses not wanting to take deep breaths to avoid "stretching the stomach."   Past Medical History  Diagnosis Date  . Chest pain   . Chronic pancreatitis   . 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 Migraine   . Seizure disorder     x 7 years  . Zollinger-Ellison syndrome   . GI bleed   . Chronic pain   . Migraine headache   . Chronic neck pain   . Pain management contract agreement   . Depression   . Seizures    Past Surgical History  Procedure Laterality Date  . Partial gastrectomy    . Eye surgery    . Hernia repair    . Small intestine surgery    . Appendectomy      per CT 2012- s/p appendectomy   Family History  Problem Relation Age of Onset  . Angina Mother   . Nephrolithiasis Father    History  Substance Use Topics  . Smoking status: Current Every Day Smoker -- 2.00 packs/day    Types: Cigarettes    Last Attempt to Quit: 07/29/2000  . Smokeless tobacco: Not on file     Comment: about 2 packs a day  . Alcohol Use: No    Review of Systems  Constitutional: Negative for fever and chills.  HENT: Negative for  congestion and rhinorrhea.   Eyes: Negative for visual disturbance.  Respiratory: Negative for cough and shortness of breath.   Cardiovascular: Negative for chest pain and palpitations.  Gastrointestinal: Positive for nausea, vomiting and abdominal pain. Negative for diarrhea.  Genitourinary: Negative for dysuria and hematuria.  Musculoskeletal: Negative for back pain and gait problem.  Skin: Negative for rash.  Neurological: Negative for weakness and headaches.      Allergies  Iohexol and Sonata  Home Medications   Prior to Admission medications   Medication Sig Start Date End Date Taking? Authorizing Provider  lipase/protease/amylase (CREON) 12000 UNITS CPEP capsule Take 12,000 Units by mouth 3 (three) times daily before meals.   Yes Historical Provider, MD  omeprazole (PRILOSEC) 20 MG capsule Take 20 mg by mouth 2 (two) times daily.   Yes Historical Provider, MD  ondansetron (ZOFRAN) 4 MG tablet Take 4 mg by mouth every 4 (four) hours as needed for nausea or vomiting (nausea).   Yes Historical Provider, MD  zolpidem (AMBIEN) 5 MG tablet Take 5 mg by mouth at bedtime as needed for sleep (insomnia).    Yes Historical Provider, MD  ondansetron (ZOFRAN) 4 MG tablet Take 1 tablet (4 mg total) by mouth every 6 (six) hours. 09/15/14   Pura Spice, PA-C  oxyCODONE-acetaminophen (PERCOCET/ROXICET) 5-325 MG per tablet Take 2 tablets by mouth every 4 (four) hours as needed for severe pain. 09/15/14   Jordan Hawks L Shaena Parkerson, PA-C   BP 103/69  Pulse 65  Temp(Src) 97.9 F (36.6 C) (Oral)  Resp 14  SpO2 100% Physical Exam  Nursing note and vitals reviewed. Constitutional: He appears well-developed and well-nourished. No distress.  HENT:  Head: Normocephalic and atraumatic.  Dry mucous membranes  Eyes: Conjunctivae and EOM are normal. Right eye exhibits no discharge. Left eye exhibits no discharge.  Cardiovascular: Normal rate, regular rhythm and normal heart sounds.   Pulmonary/Chest:  Effort normal and breath sounds normal. No respiratory distress. He has no wheezes.  Abdominal: Soft. Bowel sounds are normal. He exhibits no distension.  Soft abdomen with BS in all 4 quadrants. Patient with diffuse abdominal pain no rigidity or guarding.  Musculoskeletal:  No midline back tenderness, step off or crepitus. Midback tenderness. No CVA tenderness. Equal muscle tone. 5/5 strength in lower extremities.   Neurological: He is alert. He exhibits normal muscle tone. Coordination normal.  Skin: Skin is warm and dry. He is not diaphoretic.    ED Course  Procedures (including critical care time) Labs Review Labs Reviewed  CBC WITH DIFFERENTIAL - Abnormal; Notable for the following:    WBC 11.1 (*)    Monocytes Absolute 1.2 (*)    All other components within normal limits  COMPREHENSIVE METABOLIC PANEL - Abnormal; Notable for the following:    Glucose, Bld 128 (*)    Total Protein 8.7 (*)    Alkaline Phosphatase 118 (*)    All other components within normal limits  URINALYSIS, ROUTINE W REFLEX MICROSCOPIC - Abnormal; Notable for the following:    APPearance CLOUDY (*)    All other components within normal limits  LIPASE, BLOOD  I-STAT TROPOININ, ED    Imaging Review Dg Abd Acute W/chest  09/15/2014   CLINICAL DATA:  Smoker, shortness of breath, abdominal pain, nausea  EXAM: ACUTE ABDOMEN SERIES (ABDOMEN 2 VIEW & CHEST 1 VIEW)  COMPARISON:  07/11/2014  FINDINGS: There are postsurgical changes from prior gastric bypass surgery. There is a single dilated loop of bowel in the left mid abdomen likely related to the enteroenteric anastomosis. There is no evidence of dilated bowel loops or free intraperitoneal air. No radiopaque calculi or other significant radiographic abnormality is seen. Heart size and mediastinal contours are within normal limits. Both lungs are clear.  IMPRESSION: Negative abdominal radiographs.  No acute cardiopulmonary disease.   Electronically Signed   By:  Kathreen Devoid   On: 09/15/2014 11:12     EKG Interpretation None      MDM   Final diagnoses:  Diffuse abdominal pain  Non-intractable vomiting with nausea, vomiting of unspecified type  H/O chronic pancreatitis   Patient with chronic abdominal pain. States pain is identical to previous episodes. Patient with intractable nausea and vomiting. Patient given IV fluids, zofran and 3 rounds of pain meds. VSS. Patient with reassuring labs. Acute abdominal series without significant abnormalities. Patient with unremarkable abdominal pelvic CT 07/11/2014 and 3 negative ones in 2014. Patient tolerating fluids in ED and had a sandwich. Patient with GI appointment next week. Patient is afebrile, nontoxic, and in no acute distress. Patient is appropriate for outpatient management and is stable for discharge. Short script for pain medications. Driving and sedation precautions provided.   Discussed return precautions with patient. Discussed all results and patient verbalizes understanding and agrees with plan.  Case  has been discussed with Dr. Betsey Holiday who agrees with the above plan and to discharge.     Pura Spice, PA-C 09/15/14 [phone]

## 2014-09-15 NOTE — ED Notes (Signed)
Gave a [place] sandwich

## 2014-09-16 NOTE — ED Provider Notes (Signed)
Medical screening examination/treatment/procedure(s) were performed by non-physician practitioner and as supervising physician I was immediately available for consultation/collaboration.   EKG Interpretation None        Orpah Greek, MD 09/16/14 [phone]

## 2014-10-11 ENCOUNTER — Emergency Department (HOSPITAL_COMMUNITY)
Admission: EM | Admit: 2014-10-11 | Discharge: 2014-10-11 | Disposition: A | Discharge status: Home / Self Care | Payer: Medicare Other | Attending: Emergency Medicine | Admitting: Emergency Medicine

## 2014-10-11 ENCOUNTER — Encounter (HOSPITAL_COMMUNITY): Payer: Self-pay | Admitting: Emergency Medicine

## 2014-10-11 DIAGNOSIS — R109 Unspecified abdominal pain: Secondary | ICD-10-CM | POA: Diagnosis not present

## 2014-10-11 DIAGNOSIS — Z79899 Other long term (current) drug therapy: Secondary | ICD-10-CM | POA: Insufficient documentation

## 2014-10-11 DIAGNOSIS — Z720 Tobacco use: Secondary | ICD-10-CM | POA: Insufficient documentation

## 2014-10-11 DIAGNOSIS — Z9089 Acquired absence of other organs: Secondary | ICD-10-CM | POA: Diagnosis not present

## 2014-10-11 DIAGNOSIS — Z8669 Personal history of other diseases of the nervous system and sense organs: Secondary | ICD-10-CM | POA: Diagnosis not present

## 2014-10-11 DIAGNOSIS — Z8679 Personal history of other diseases of the circulatory system: Secondary | ICD-10-CM | POA: Diagnosis not present

## 2014-10-11 DIAGNOSIS — K861 Other chronic pancreatitis: Secondary | ICD-10-CM | POA: Diagnosis not present

## 2014-10-11 DIAGNOSIS — R112 Nausea with vomiting, unspecified: Secondary | ICD-10-CM | POA: Diagnosis present

## 2014-10-11 DIAGNOSIS — Z8659 Personal history of other mental and behavioral disorders: Secondary | ICD-10-CM | POA: Insufficient documentation

## 2014-10-11 DIAGNOSIS — Z8739 Personal history of other diseases of the musculoskeletal system and connective tissue: Secondary | ICD-10-CM | POA: Insufficient documentation

## 2014-10-11 DIAGNOSIS — G8929 Other chronic pain: Secondary | ICD-10-CM | POA: Diagnosis not present

## 2014-10-11 LAB — COMPREHENSIVE METABOLIC PANEL
ALT: 17 U/L (ref 0–53)
AST: 17 U/L (ref 0–37)
Albumin: 3.6 g/dL (ref 3.5–5.2)
Alkaline Phosphatase: 130 U/L — ABNORMAL HIGH (ref 39–117)
Anion gap: 15 (ref 5–15)
BUN: 13 mg/dL (ref 6–23)
CHLORIDE: 98 meq/L (ref 96–112)
CO2: 24 meq/L (ref 19–32)
Calcium: 9.8 mg/dL (ref 8.4–10.5)
Creatinine, Ser: 0.6 mg/dL (ref 0.50–1.35)
GFR calc Af Amer: >90 mL/min (ref >90)
Glucose, Bld: 118 mg/dL — ABNORMAL HIGH (ref 70–99)
Potassium: 4 mEq/L (ref 3.7–5.3)
Sodium: 137 mEq/L (ref 137–147)
Total Bilirubin: 0.4 mg/dL (ref 0.3–1.2)
Total Protein: 8.1 g/dL (ref 6.0–8.3)

## 2014-10-11 LAB — CBC WITH DIFFERENTIAL/PLATELET
Basophils Absolute: 0 10*3/uL (ref 0.0–0.1)
Basophils Relative: 0 % (ref 0–1)
Eosinophils Absolute: 0.2 10*3/uL (ref 0.0–0.7)
Eosinophils Relative: 1 % (ref 0–5)
HCT: 42.3 % (ref 39.0–52.0)
Hemoglobin: 14.3 g/dL (ref 13.0–17.0)
Lymphocytes Relative: 16 % (ref 12–46)
Lymphs Abs: 2.2 10*3/uL (ref 0.7–4.0)
MCH: 29.9 pg (ref 26.0–34.0)
MCHC: 33.8 g/dL (ref 30.0–36.0)
MCV: 88.3 fL (ref 78.0–100.0)
Monocytes Absolute: 1.7 10*3/uL — ABNORMAL HIGH (ref 0.1–1.0)
Monocytes Relative: 12 % (ref 3–12)
Neutro Abs: 9.9 10*3/uL — ABNORMAL HIGH (ref 1.7–7.7)
Neutrophils Relative %: 71 % (ref 43–77)
Platelets: 322 10*3/uL (ref 150–400)
RBC: 4.79 MIL/uL (ref 4.22–5.81)
RDW: 13.8 % (ref 11.5–15.5)
WBC: 14.1 10*3/uL — ABNORMAL HIGH (ref 4.0–10.5)

## 2014-10-11 LAB — LIPASE, BLOOD: Lipase: 55 U/L (ref 11–59)

## 2014-10-11 LAB — URINALYSIS, ROUTINE W REFLEX MICROSCOPIC
Bilirubin Urine: NEGATIVE
GLUCOSE, UA: NEGATIVE mg/dL
Hgb urine dipstick: NEGATIVE
KETONES UR: NEGATIVE mg/dL
LEUKOCYTES UA: NEGATIVE
Nitrite: NEGATIVE
Protein, ur: NEGATIVE mg/dL
SPECIFIC GRAVITY, URINE: 1.015 (ref 1.005–1.030)
Urobilinogen, UA: 0.2 mg/dL (ref 0.0–1.0)
pH: 5.5 (ref 5.0–8.0)

## 2014-10-11 MED ORDER — OXYCODONE-ACETAMINOPHEN 5-325 MG PO TABS: 1.0000 | ORAL_TABLET | ORAL | Status: DC | PRN

## 2014-10-11 MED ORDER — HYDROMORPHONE HCL 1 MG/ML IJ SOLN
1.0000 mg | Freq: Once | INTRAMUSCULAR | Status: AC
  Administered 2014-10-11: 1 mg via INTRAVENOUS
  Filled 2014-10-11: qty 1

## 2014-10-11 MED ORDER — KETOROLAC TROMETHAMINE 30 MG/ML IJ SOLN
30.0000 mg | Freq: Once | INTRAMUSCULAR | Status: AC
  Administered 2014-10-11: 30 mg via INTRAVENOUS
  Filled 2014-10-11: qty 1

## 2014-10-11 MED ORDER — HYDROMORPHONE HCL 1 MG/ML IJ SOLN
1.0000 mg | Freq: Once | INTRAMUSCULAR | Status: AC
Start: 2014-10-11 — End: 2014-10-11
  Administered 2014-10-11: 1 mg via INTRAVENOUS
  Filled 2014-10-11: qty 1

## 2014-10-11 MED ORDER — ONDANSETRON HCL 4 MG/2ML IJ SOLN
4.0000 mg | Freq: Once | INTRAMUSCULAR | Status: AC
  Administered 2014-10-11: 4 mg via INTRAVENOUS
  Filled 2014-10-11: qty 2

## 2014-10-11 MED ORDER — OXYCODONE-ACETAMINOPHEN 5-325 MG PO TABS
1.0000 | ORAL_TABLET | Freq: Once | ORAL | Status: AC
  Administered 2014-10-11: 1 via ORAL
  Filled 2014-10-11: qty 1

## 2014-10-11 NOTE — ED Notes (Signed)
Patient tolerating ginger ale. Crackers given.

## 2014-10-11 NOTE — ED Notes (Signed)
Pt states he has chronic pancreatitis and some blockages  Pt states he has been having vomiting since yesterday and is c/o abd pain

## 2014-10-11 NOTE — ED Notes (Signed)
Patient ok for POs per PA. Patient given ginger-ale.

## 2014-10-11 NOTE — Discharge Instructions (Signed)

## 2014-10-11 NOTE — ED Notes (Signed)
Patient states "my pain is creeping back up", patient given sandwich and soda. PA notified of patient request for additional pain medication.

## 2014-10-11 NOTE — ED Provider Notes (Signed)
CSN: [id number]     Arrival date & time 10/11/14  0129 History   First MD Initiated Contact with Patient 10/11/14 0131     Chief Complaint  Patient presents with  . Pancreatitis  . Emesis     (Consider location/radiation/quality/duration/timing/severity/associated sxs/prior Treatment) Patient is a 53 y.o. male presenting with vomiting. The history is provided by the patient. No language interpreter was used.  Emesis Severity:  Severe Associated symptoms: abdominal pain   Associated symptoms: no chills and no diarrhea   Associated symptoms comment:  Recurrent abdominal pain he states is c/w previous bouts of pancreatitis. No fever. He has been moving his bowel regularly without seeing any blood or melena. He reports nausea with vomiting, also without blood. He is usually followed by the [facility].    Past Medical History  Diagnosis Date  . Chest pain   . Chronic pancreatitis   . 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 Migraine   . Seizure disorder     x 7 years  . Zollinger-Ellison syndrome   . GI bleed   . Chronic pain   . Migraine headache   . Chronic neck pain   . Pain management contract agreement   . Depression   . Seizures    Past Surgical History  Procedure Laterality Date  . Partial gastrectomy    . Eye surgery    . Hernia repair    . Small intestine surgery    . Appendectomy      per CT 2012- s/p appendectomy  . Cholecystectomy     Family History  Problem Relation Age of Onset  . Angina Mother   . Nephrolithiasis Father   . Cancer Brother    History  Substance Use Topics  . Smoking status: Current Every Day Smoker -- 2.00 packs/day    Types: Cigarettes    Last Attempt to Quit: 07/29/2000  . Smokeless tobacco: Not on file     Comment: about 2 packs a day  . Alcohol Use: No    Review of Systems  Constitutional: Negative for fever and chills.  HENT: Negative.   Respiratory: Negative.  Negative for shortness of breath.   Cardiovascular:  Negative.  Negative for chest pain.  Gastrointestinal: Positive for nausea, vomiting and abdominal pain. Negative for diarrhea and constipation.  Genitourinary: Negative.   Musculoskeletal: Negative.   Skin: Negative.   Neurological: Negative.       Allergies  Iohexol and Sonata  Home Medications   Prior to Admission medications   Medication Sig Start Date End Date Taking? Authorizing Provider  lipase/protease/amylase (CREON) 12000 UNITS CPEP capsule Take 12,000 Units by mouth 3 (three) times daily before meals.   Yes Historical Provider, MD  omeprazole (PRILOSEC) 20 MG capsule Take 20 mg by mouth 2 (two) times daily.   Yes Historical Provider, MD  ondansetron (ZOFRAN) 4 MG tablet Take 4 mg by mouth every 4 (four) hours as needed for nausea or vomiting (nausea).    Historical Provider, MD  ondansetron (ZOFRAN) 4 MG tablet Take 1 tablet (4 mg total) by mouth every 6 (six) hours. 09/15/14   Pura Spice, PA-C  oxyCODONE-acetaminophen (PERCOCET/ROXICET) 5-325 MG per tablet Take 2 tablets by mouth every 4 (four) hours as needed for severe pain. 09/15/14   Pura Spice, PA-C  zolpidem (AMBIEN) 5 MG tablet Take 5 mg by mouth at bedtime as needed for sleep (insomnia).     Historical Provider, MD  BP 113/80 mmHg  Pulse 81  Temp(Src) 98.6 F (37 C) (Oral)  Resp 16  Ht 5\' 5"  (1.651 m)  Wt 105 lb (47.628 kg)  BMI 17.47 kg/m2  SpO2 96% Physical Exam  Constitutional: He is oriented to person, place, and time. He appears well-developed and well-nourished.  HENT:  Head: Normocephalic.  Neck: Normal range of motion. Neck supple.  Cardiovascular: Normal rate and regular rhythm.   Pulmonary/Chest: Effort normal and breath sounds normal.  Abdominal: Soft. There is no tenderness. There is no rebound and no guarding.  Mid-abdominal tenderness of soft abdomen. No distention. Well healed midline surgical scarring.   Musculoskeletal: Normal range of motion.  Neurological: He is alert  and oriented to person, place, and time.  Skin: Skin is warm and dry. No rash noted.  Psychiatric: He has a normal mood and affect.    ED Course  Procedures (including critical care time) Labs Review Labs Reviewed  CBC WITH DIFFERENTIAL - Abnormal; Notable for the following:    WBC 14.1 (*)    Neutro Abs 9.9 (*)    Monocytes Absolute 1.7 (*)    All other components within normal limits  COMPREHENSIVE METABOLIC PANEL - Abnormal; Notable for the following:    Glucose, Bld 118 (*)    Alkaline Phosphatase 130 (*)    All other components within normal limits  LIPASE, BLOOD  URINALYSIS, ROUTINE W REFLEX MICROSCOPIC    Imaging Review No results found.   EKG Interpretation None      MDM   Final diagnoses:  None    1. Chronic abdominal pain  Labs are reassuring. Pain is improved with medications and he is now tolerating PO fluids and solids. Transitioning over to PO pain medications and anticipate discharge home.   Discussed discharge home with the patient. He is comfortable with plan - will f/u with [place] center.   Dewaine Oats, PA-C 10/11/14 0540  Julianne Rice, MD 10/11/14 [phone]

## 2014-10-11 NOTE — ED Notes (Addendum)
Patient states pain medication did not help, patient requesting additional dose. Patient was requested to provide urine sample. PA notified.

## 2014-10-11 NOTE — ED Notes (Signed)
Pt has a urinal at the bedside.

## 2014-10-29 ENCOUNTER — Encounter (HOSPITAL_COMMUNITY): Payer: Self-pay | Admitting: Emergency Medicine

## 2014-10-29 ENCOUNTER — Emergency Department (HOSPITAL_COMMUNITY)
Admission: EM | Admit: 2014-10-29 | Discharge: 2014-10-29 | Disposition: A | Discharge status: Home / Self Care | Payer: Medicare Other | Attending: Emergency Medicine | Admitting: Emergency Medicine

## 2014-10-29 ENCOUNTER — Emergency Department (HOSPITAL_COMMUNITY): Payer: Medicare Other

## 2014-10-29 DIAGNOSIS — R1010 Upper abdominal pain, unspecified: Secondary | ICD-10-CM | POA: Diagnosis present

## 2014-10-29 DIAGNOSIS — Z8719 Personal history of other diseases of the digestive system: Secondary | ICD-10-CM | POA: Diagnosis not present

## 2014-10-29 DIAGNOSIS — R112 Nausea with vomiting, unspecified: Secondary | ICD-10-CM | POA: Diagnosis not present

## 2014-10-29 DIAGNOSIS — Z9889 Other specified postprocedural states: Secondary | ICD-10-CM | POA: Diagnosis not present

## 2014-10-29 DIAGNOSIS — Z8739 Personal history of other diseases of the musculoskeletal system and connective tissue: Secondary | ICD-10-CM | POA: Diagnosis not present

## 2014-10-29 DIAGNOSIS — Z720 Tobacco use: Secondary | ICD-10-CM | POA: Diagnosis not present

## 2014-10-29 DIAGNOSIS — Z8659 Personal history of other mental and behavioral disorders: Secondary | ICD-10-CM | POA: Diagnosis not present

## 2014-10-29 DIAGNOSIS — R000 Tachycardia, unspecified: Secondary | ICD-10-CM | POA: Diagnosis not present

## 2014-10-29 DIAGNOSIS — Z9089 Acquired absence of other organs: Secondary | ICD-10-CM | POA: Insufficient documentation

## 2014-10-29 DIAGNOSIS — Z79899 Other long term (current) drug therapy: Secondary | ICD-10-CM | POA: Diagnosis not present

## 2014-10-29 DIAGNOSIS — G8929 Other chronic pain: Secondary | ICD-10-CM | POA: Diagnosis not present

## 2014-10-29 DIAGNOSIS — R109 Unspecified abdominal pain: Secondary | ICD-10-CM

## 2014-10-29 LAB — CBC WITH DIFFERENTIAL/PLATELET
BASOS PCT: 0 % (ref 0–1)
Basophils Absolute: 0 10*3/uL (ref 0.0–0.1)
Eosinophils Absolute: 0 10*3/uL (ref 0.0–0.7)
Eosinophils Relative: 0 % (ref 0–5)
HEMATOCRIT: 43.4 % (ref 39.0–52.0)
HEMOGLOBIN: 14.8 g/dL (ref 13.0–17.0)
Lymphocytes Relative: 7 % — ABNORMAL LOW (ref 12–46)
Lymphs Abs: 1 10*3/uL (ref 0.7–4.0)
MCH: 29.8 pg (ref 26.0–34.0)
MCHC: 34.1 g/dL (ref 30.0–36.0)
MCV: 87.5 fL (ref 78.0–100.0)
MONO ABS: 2.1 10*3/uL — AB (ref 0.1–1.0)
MONOS PCT: 15 % — AB (ref 3–12)
Neutro Abs: 11.4 10*3/uL — ABNORMAL HIGH (ref 1.7–7.7)
Neutrophils Relative %: 78 % — ABNORMAL HIGH (ref 43–77)
Platelets: 335 10*3/uL (ref 150–400)
RBC: 4.96 MIL/uL (ref 4.22–5.81)
RDW: 14.1 % (ref 11.5–15.5)
WBC: 14.5 10*3/uL — ABNORMAL HIGH (ref 4.0–10.5)

## 2014-10-29 LAB — COMPREHENSIVE METABOLIC PANEL
ALT: 17 U/L (ref 0–53)
AST: 22 U/L (ref 0–37)
Albumin: 3.8 g/dL (ref 3.5–5.2)
Alkaline Phosphatase: 121 U/L — ABNORMAL HIGH (ref 39–117)
Anion gap: 18 — ABNORMAL HIGH (ref 5–15)
BILIRUBIN TOTAL: 0.8 mg/dL (ref 0.3–1.2)
BUN: 15 mg/dL (ref 6–23)
CHLORIDE: 95 meq/L — AB (ref 96–112)
CO2: 21 mEq/L (ref 19–32)
CREATININE: 0.61 mg/dL (ref 0.50–1.35)
Calcium: 9.8 mg/dL (ref 8.4–10.5)
GFR calc Af Amer: >90 mL/min (ref >90)
GFR calc non Af Amer: >90 mL/min (ref >90)
Glucose, Bld: 116 mg/dL — ABNORMAL HIGH (ref 70–99)
POTASSIUM: 4.2 meq/L (ref 3.7–5.3)
SODIUM: 134 meq/L — AB (ref 137–147)
Total Protein: 8.5 g/dL — ABNORMAL HIGH (ref 6.0–8.3)

## 2014-10-29 LAB — LIPASE, BLOOD: Lipase: 17 U/L (ref 11–59)

## 2014-10-29 LAB — I-STAT CG4 LACTIC ACID, ED: Lactic Acid, Venous: 2.09 mmol/L (ref 0.5–2.2)

## 2014-10-29 LAB — I-STAT TROPONIN, ED: Troponin i, poc: 0 ng/mL (ref 0.00–0.08)

## 2014-10-29 MED ORDER — MORPHINE SULFATE 4 MG/ML IJ SOLN
4.0000 mg | Freq: Once | INTRAMUSCULAR | Status: AC
  Administered 2014-10-29: 4 mg via INTRAVENOUS
  Filled 2014-10-29: qty 1

## 2014-10-29 MED ORDER — SODIUM CHLORIDE 0.9 % IV SOLN: 1000.0000 mL | INTRAVENOUS | Status: DC

## 2014-10-29 MED ORDER — MORPHINE SULFATE 4 MG/ML IJ SOLN
4.0000 mg | Freq: Once | INTRAMUSCULAR | Status: AC
Start: 2014-10-29 — End: 2014-10-29
  Administered 2014-10-29: 4 mg via INTRAVENOUS
  Filled 2014-10-29: qty 1

## 2014-10-29 MED ORDER — SODIUM CHLORIDE 0.9 % IV SOLN
1000.0000 mL | Freq: Once | INTRAVENOUS | Status: AC
  Administered 2014-10-29: 1000 mL via INTRAVENOUS

## 2014-10-29 MED ORDER — ONDANSETRON HCL 4 MG/2ML IJ SOLN
4.0000 mg | Freq: Once | INTRAMUSCULAR | Status: AC
  Administered 2014-10-29: 4 mg via INTRAVENOUS
  Filled 2014-10-29: qty 2

## 2014-10-29 NOTE — ED Provider Notes (Signed)
CSN: [id number]     Arrival date & time 10/29/14  1901 History   First MD Initiated Contact with Patient 10/29/14 1957     Chief Complaint  Patient presents with  . Abdominal Pain     (Consider location/radiation/quality/duration/timing/severity/associated sxs/prior Treatment) HPI 53 y.o. Male with hisotyr of pancreatitis and sbo presents with chronic abdominal pain with pain increased for two days.  Patient states pain diffusely across upper abdomen.  Multiple episodes of vomiting. He stats he has tried to drink water and mountain dew today but vomited after.  Emesis of fluids c.w. What he has taken in no blood noted.  Patient states he has had pancreatitis since 1983,s/p partial gastrectomy, states he is undergoing a series of procedures for bowel obstruction with balloon (?).  Patient states taking zofran, creon, and prilosec without relief.  Denies any narcotic use since completed last rx from here.  Past Medical History  Diagnosis Date  . Chest pain   . Chronic pancreatitis   . 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 Migraine   . Seizure disorder     x 7 years  . Zollinger-Ellison syndrome   . GI bleed   . Chronic pain   . Migraine headache   . Chronic neck pain   . Pain management contract agreement   . Depression   . Seizures    Past Surgical History  Procedure Laterality Date  . Partial gastrectomy    . Eye surgery    . Hernia repair    . Small intestine surgery    . Appendectomy      per CT 2012- s/p appendectomy  . Cholecystectomy     Family History  Problem Relation Age of Onset  . Angina Mother   . Nephrolithiasis Father   . Cancer Brother    History  Substance Use Topics  . Smoking status: Current Every Day Smoker -- 2.00 packs/day    Types: Cigarettes    Last Attempt to Quit: 07/29/2000  . Smokeless tobacco: Not on file     Comment: about 2 packs a day  . Alcohol Use: No    Review of Systems  All other systems reviewed and are  negative.     Allergies  Iohexol and Sonata  Home Medications   Prior to Admission medications   Medication Sig Start Date End Date Taking? Authorizing Provider  lipase/protease/amylase (CREON) 12000 UNITS CPEP capsule Take 12,000 Units by mouth 3 (three) times daily before meals.   Yes Historical Provider, MD  omeprazole (PRILOSEC) 20 MG capsule Take 20 mg by mouth 2 (two) times daily.   Yes Historical Provider, MD  ondansetron (ZOFRAN) 4 MG tablet Take 1 tablet (4 mg total) by mouth every 6 (six) hours. 09/15/14  Yes Pura Spice, PA-C  oxyCODONE-acetaminophen (PERCOCET/ROXICET) 5-325 MG per tablet Take 1-2 tablets by mouth every 4 (four) hours as needed for severe pain. 10/11/14  Yes Shari A Upstill, PA-C  zolpidem (AMBIEN) 5 MG tablet Take 5 mg by mouth at bedtime as needed for sleep (insomnia).    Yes Historical Provider, MD   Pulse 126  Temp(Src) 98.6 F (37 C) (Oral)  Resp 18  Ht 5' 5"  (1.651 m)  Wt 107 lb (48.535 kg)  BMI 17.81 kg/m2  SpO2 96% Physical Exam  Constitutional: He is oriented to person, place, and time. He appears well-developed and well-nourished.  HENT:  Head: Normocephalic and atraumatic.  Right Ear: External ear normal.  Left Ear: External ear normal.  Nose: Nose normal.  Mouth/Throat: Oropharynx is clear and moist.  Eyes: Conjunctivae and EOM are normal. Pupils are equal, round, and reactive to light.  Neck: Normal range of motion. Neck supple.  Cardiovascular: Regular rhythm, normal heart sounds and intact distal pulses.  Tachycardia present.   Pulmonary/Chest: Effort normal and breath sounds normal. No respiratory distress. He has no wheezes. He exhibits no tenderness.  Abdominal: Soft. Bowel sounds are normal. He exhibits no distension and no mass. There is tenderness. There is no guarding.  Musculoskeletal: Normal range of motion.  Neurological: He is alert and oriented to person, place, and time. He has normal reflexes. He exhibits normal  muscle tone. Coordination normal.  Skin: Skin is warm and dry.  Psychiatric: He has a normal mood and affect. His behavior is normal. Judgment and thought content normal.  Nursing note and vitals reviewed.   ED Course  Procedures (including critical care time) Labs Review Labs Reviewed  COMPREHENSIVE METABOLIC PANEL - Abnormal; Notable for the following:    Sodium 134 (*)    Chloride 95 (*)    Glucose, Bld 116 (*)    Total Protein 8.5 (*)    Alkaline Phosphatase 121 (*)    Anion gap 18 (*)    All other components within normal limits  CBC WITH DIFFERENTIAL - Abnormal; Notable for the following:    WBC 14.5 (*)    Neutrophils Relative % 78 (*)    Neutro Abs 11.4 (*)    Lymphocytes Relative 7 (*)    Monocytes Relative 15 (*)    Monocytes Absolute 2.1 (*)    All other components within normal limits  LIPASE, BLOOD  URINALYSIS, ROUTINE W REFLEX MICROSCOPIC  I-STAT TROPOININ, ED  I-STAT CG4 LACTIC ACID, ED    Imaging Review Dg Abd 1 View  10/29/2014   CLINICAL DATA:  53 year old male with low abdominal pain radiating into the right flank accompanied by vomiting. History of partial gastrectomy, appendectomy, cholecystectomy and small bowel surgery.  EXAM: ABDOMEN - 1 VIEW  COMPARISON:  Prior acute abdominal series 1015 2015  FINDINGS: The bowel gas pattern is not obstructed. Chain sutures project over the anatomic pelvis and a left upper quadrant. No evidence of ascites. No large free air on this single supine radiograph. No organomegaly or abnormal calcification. No acute osseous abnormality.  IMPRESSION: No evidence of bowel obstruction or other acute abnormality.   Electronically Signed   By: Jacqulynn Cadet M.D.   On: 10/29/2014 22:27     EKG Interpretation   Date/Time:  Saturday October 29 2014 19:36:30 EST Ventricular Rate:  124 PR Interval:  123 QRS Duration: 80 QT Interval:  308 QTC Calculation: 442 R Axis:   68 Text Interpretation:  Sinus tachycardia LAE,  consider biatrial enlargement  RSR' in V1 or V2, probably normal variant Baseline wander in lead(s) V5 No  significant change since last tracing other than rate is faster Confirmed  by WARD,  DO, KRISTEN [phone]) on 10/29/2014 7:48:44 PM      MDM   Final diagnoses:  Abdominal pain  Chronic abdominal pain  Non-intractable vomiting with nausea, vomiting of unspecified type    Patient with history of chronic abdominal pain and pancreatitis presents with worsened pain.  Patient initially tachycardiac but this has resolved with iv fluids and pain meds.  Patient continues to complain of pain. No vomiting here. Plan x-rays normal. WBC elevated but c.w. Prior wbc on last visit.  Lipase and transaminases normal but alk phos elevated.   Reassessed patient and feels improved.  Patient wants to try po - plan crackers and fluid.     Shaune Pollack, MD 10/29/14 [phone]

## 2014-10-29 NOTE — ED Notes (Signed)
Pt given saltine crackers and water per EDP Ray

## 2014-10-29 NOTE — ED Notes (Signed)
Bed: WA16 Expected date:  Expected time:  Means of arrival:  Comments: Tr 1 

## 2014-10-29 NOTE — ED Notes (Signed)
Pt c/o abd pain, chronic with worsening pain yesterday. Pt states he he has a hx of pancreatitis and SBO x 3, states pain is similar to flare of these in the past. Pt states he has appt with [place] surgeon next month. +n/v, emesis x 8-12 times today

## 2014-10-29 NOTE — Discharge Instructions (Signed)
Abdominal Pain Many things can cause belly (abdominal) pain. Most times, the belly pain is not dangerous. Many cases of belly pain can be watched and treated at home. HOME CARE   Do not take medicines that help you go poop (laxatives) unless told to by your doctor.  Only take medicine as told by your doctor.  Eat or drink as told by your doctor. Your doctor will tell you if you should be on a special diet. GET HELP IF:  You do not know what is causing your belly pain.  You have belly pain while you are sick to your stomach (nauseous) or have runny poop (diarrhea).  You have pain while you pee or poop.  Your belly pain wakes you up at night.  You have belly pain that gets worse or better when you eat.  You have belly pain that gets worse when you eat fatty foods.  You have a fever. GET HELP RIGHT AWAY IF:   The pain does not go away within 2 hours.  You keep throwing up (vomiting).  The pain changes and is only in the right or left part of the belly.  You have bloody or tarry looking poop. MAKE SURE YOU:   Understand these instructions.  Will watch your condition.  Will get help right away if you are not doing well or get worse. Document Released: 05/06/2008 Document Revised: 11/23/2013 Document Reviewed: 07/28/2013 [facility] Patient Information 2015 [place], [place]. This information is not intended to replace advice given to you by your health care provider. Make sure you discuss any questions you have with your health care provider. Chronic Pain Chronic pain can be defined as pain that is off and on and lasts for 3-6 months or longer. Many things cause chronic pain, which can make it difficult to make a diagnosis. There are many treatment options available for chronic pain. However, finding a treatment that works well for you may require trying various approaches until the right one is found. Many people benefit from a combination of two or more types of treatment to  control their pain. SYMPTOMS  Chronic pain can occur anywhere in the body and can range from mild to very severe. Some types of chronic pain include:  Headache.  Low back pain.  Cancer pain.  Arthritis pain.  Neurogenic pain. This is pain resulting from damage to nerves. People with chronic pain may also have other symptoms such as:  Depression.  Anger.  Insomnia.  Anxiety. DIAGNOSIS  Your health care provider will help diagnose your condition over time. In many cases, the initial focus will be on excluding possible conditions that could be causing the pain. Depending on your symptoms, your health care provider may order tests to diagnose your condition. Some of these tests may include:   Blood tests.   CT scan.   MRI.   X-rays.   Ultrasounds.   Nerve conduction studies.  You may need to see a specialist.  TREATMENT  Finding treatment that works well may take time. You may be referred to a pain specialist. He or she may prescribe medicine or therapies, such as:   Mindful meditation or yoga.  Shots (injections) of numbing or pain-relieving medicines into the spine or area of pain.  Local electrical stimulation.  Acupuncture.   Massage therapy.   Aroma, color, light, or sound therapy.   Biofeedback.   Working with a physical therapist to keep from getting stiff.   Regular, gentle exercise.   Cognitive  or behavioral therapy.   Group support.  Sometimes, surgery may be recommended.  HOME CARE INSTRUCTIONS   Take all medicines as directed by your health care provider.   Lessen stress in your life by relaxing and doing things such as listening to calming music.   Exercise or be active as directed by your health care provider.   Eat a healthy diet and include things such as vegetables, fruits, fish, and lean meats in your diet.   Keep all follow-up appointments with your health care provider.   Attend a support group with others  suffering from chronic pain. SEEK MEDICAL CARE IF:   Your pain gets worse.   You develop a new pain that was not there before.   You cannot tolerate medicines given to you by your health care provider.   You have new symptoms since your last visit with your health care provider.  SEEK IMMEDIATE MEDICAL CARE IF:   You feel weak.   You have decreased sensation or numbness.   You lose control of bowel or bladder function.   Your pain suddenly gets much worse.   You develop shaking.  You develop chills.  You develop confusion.  You develop chest pain.  You develop shortness of breath.  MAKE SURE YOU:  Understand these instructions.  Will watch your condition.  Will get help right away if you are not doing well or get worse. Document Released: 08/10/2002 Document Revised: 07/21/2013 Document Reviewed: 05/14/2013 [facility] Patient Information 2015 [place], [place]. This information is not intended to replace advice given to you by your health care provider. Make sure you discuss any questions you have with your health care provider.

## 2014-11-11 ENCOUNTER — Emergency Department (HOSPITAL_COMMUNITY)
Admission: EM | Admit: 2014-11-11 | Discharge: 2014-11-11 | Disposition: A | Discharge status: Home / Self Care | Payer: Medicare Other | Attending: Emergency Medicine | Admitting: Emergency Medicine

## 2014-11-11 ENCOUNTER — Encounter (HOSPITAL_COMMUNITY): Payer: Self-pay | Admitting: Emergency Medicine

## 2014-11-11 DIAGNOSIS — R1010 Upper abdominal pain, unspecified: Secondary | ICD-10-CM | POA: Diagnosis present

## 2014-11-11 DIAGNOSIS — Z720 Tobacco use: Secondary | ICD-10-CM | POA: Diagnosis not present

## 2014-11-11 DIAGNOSIS — Z8639 Personal history of other endocrine, nutritional and metabolic disease: Secondary | ICD-10-CM | POA: Insufficient documentation

## 2014-11-11 DIAGNOSIS — Z8679 Personal history of other diseases of the circulatory system: Secondary | ICD-10-CM | POA: Diagnosis not present

## 2014-11-11 DIAGNOSIS — Z8659 Personal history of other mental and behavioral disorders: Secondary | ICD-10-CM | POA: Diagnosis not present

## 2014-11-11 DIAGNOSIS — K861 Other chronic pancreatitis: Secondary | ICD-10-CM | POA: Insufficient documentation

## 2014-11-11 DIAGNOSIS — G8929 Other chronic pain: Secondary | ICD-10-CM | POA: Diagnosis not present

## 2014-11-11 DIAGNOSIS — Z79899 Other long term (current) drug therapy: Secondary | ICD-10-CM | POA: Insufficient documentation

## 2014-11-11 DIAGNOSIS — Z8739 Personal history of other diseases of the musculoskeletal system and connective tissue: Secondary | ICD-10-CM | POA: Insufficient documentation

## 2014-11-11 DIAGNOSIS — Z8669 Personal history of other diseases of the nervous system and sense organs: Secondary | ICD-10-CM | POA: Diagnosis not present

## 2014-11-11 DIAGNOSIS — R51 Headache: Secondary | ICD-10-CM | POA: Diagnosis not present

## 2014-11-11 DIAGNOSIS — R519 Headache, unspecified: Secondary | ICD-10-CM

## 2014-11-11 LAB — LIPASE, BLOOD: Lipase: 61 U/L — ABNORMAL HIGH (ref 11–59)

## 2014-11-11 MED ORDER — KETOROLAC TROMETHAMINE 30 MG/ML IJ SOLN
30.0000 mg | Freq: Once | INTRAMUSCULAR | Status: AC
  Administered 2014-11-11: 30 mg via INTRAVENOUS
  Filled 2014-11-11: qty 1

## 2014-11-11 MED ORDER — DICYCLOMINE HCL 20 MG PO TABS: 20.0000 mg | ORAL_TABLET | Freq: Four times a day (QID) | ORAL | Status: DC | PRN

## 2014-11-11 MED ORDER — METOCLOPRAMIDE HCL 5 MG/ML IJ SOLN
10.0000 mg | Freq: Once | INTRAMUSCULAR | Status: AC
  Administered 2014-11-11: 10 mg via INTRAVENOUS
  Filled 2014-11-11: qty 2

## 2014-11-11 MED ORDER — ONDANSETRON HCL 4 MG PO TABS: 4.0000 mg | ORAL_TABLET | Freq: Four times a day (QID) | ORAL | Status: DC

## 2014-11-11 MED ORDER — LORAZEPAM 2 MG/ML IJ SOLN
1.0000 mg | Freq: Once | INTRAMUSCULAR | Status: AC
  Administered 2014-11-11: 1 mg via INTRAVENOUS
  Filled 2014-11-11: qty 1

## 2014-11-11 MED ORDER — DICYCLOMINE HCL 10 MG/ML IM SOLN
20.0000 mg | Freq: Once | INTRAMUSCULAR | Status: AC
  Administered 2014-11-11: 20 mg via INTRAMUSCULAR
  Filled 2014-11-11: qty 2

## 2014-11-11 MED ORDER — DEXAMETHASONE SODIUM PHOSPHATE 10 MG/ML IJ SOLN
10.0000 mg | Freq: Once | INTRAMUSCULAR | Status: AC
  Administered 2014-11-11: 10 mg via INTRAVENOUS
  Filled 2014-11-11: qty 1

## 2014-11-11 MED ORDER — DIPHENHYDRAMINE HCL 50 MG/ML IJ SOLN
25.0000 mg | Freq: Once | INTRAMUSCULAR | Status: AC
  Administered 2014-11-11: 25 mg via INTRAVENOUS
  Filled 2014-11-11: qty 1

## 2014-11-11 NOTE — ED Provider Notes (Signed)
CSN: [id number]     Arrival date & time 11/11/14  0244 History   First MD Initiated Contact with Patient 11/11/14 0329     Chief Complaint  Patient presents with  . Abdominal Pain  . Migraine     (Consider location/radiation/quality/duration/timing/severity/associated sxs/prior Treatment) HPI 53 year old male presents to emergency department with complaint of migraine headache, upper abdominal pain, nausea and vomiting.  Symptoms have been ongoing for the last 2 days.  Patient has history of chronic pancreatitis.  He reports that he is taking his Creon and lipase.  Patient is followed at the [place], mainly.  No change in his baseline pain location of pain or nature of pain.  Patient reports he's had a long history of migraines as well. Past Medical History  Diagnosis Date  . Chest pain   . Chronic pancreatitis   . 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 Migraine   . Seizure disorder     x 7 years  . Zollinger-Ellison syndrome   . GI bleed   . Chronic pain   . Migraine headache   . Chronic neck pain   . Pain management contract agreement   . Depression   . Seizures    Past Surgical History  Procedure Laterality Date  . Partial gastrectomy    . Eye surgery    . Hernia repair    . Small intestine surgery    . Appendectomy      per CT 2012- s/p appendectomy  . Cholecystectomy     Family History  Problem Relation Age of Onset  . Angina Mother   . Nephrolithiasis Father   . Cancer Brother    History  Substance Use Topics  . Smoking status: Current Every Day Smoker -- 2.00 packs/day    Types: Cigarettes    Last Attempt to Quit: 07/29/2000  . Smokeless tobacco: Not on file     Comment: about 2 packs a day  . Alcohol Use: No    Review of Systems  See History of Present Illness; otherwise all other systems are reviewed and negative   Allergies  Iohexol and Sonata  Home Medications   Prior to Admission medications   Medication Sig Start Date End Date Taking?  Authorizing Provider  lipase/protease/amylase (CREON) 12000 UNITS CPEP capsule Take 12,000 Units by mouth 3 (three) times daily before meals.   Yes Historical Provider, MD  omeprazole (PRILOSEC) 20 MG capsule Take 20 mg by mouth 2 (two) times daily.   Yes Historical Provider, MD  ondansetron (ZOFRAN) 4 MG tablet Take 1 tablet (4 mg total) by mouth every 6 (six) hours. 09/15/14  Yes Pura Spice, PA-C  oxyCODONE-acetaminophen (PERCOCET/ROXICET) 5-325 MG per tablet Take 1-2 tablets by mouth every 4 (four) hours as needed for severe pain. 10/11/14  Yes Shari A Upstill, PA-C  zolpidem (AMBIEN) 5 MG tablet Take 5 mg by mouth at bedtime as needed for sleep (insomnia).     Historical Provider, MD   BP 112/83 mmHg  Pulse 117  Temp(Src) 98.1 F (36.7 C) (Oral)  Resp 18  SpO2 100% Physical Exam  Constitutional: He is oriented to person, place, and time. He appears well-developed and well-nourished.  HENT:  Head: Normocephalic and atraumatic.  Nose: Nose normal.  Mouth/Throat: Oropharynx is clear and moist.  Eyes: Conjunctivae and EOM are normal. Pupils are equal, round, and reactive to light.  Neck: Normal range of motion. Neck supple. No JVD present. No tracheal deviation  present. No thyromegaly present.  Cardiovascular: Normal rate, regular rhythm, normal heart sounds and intact distal pulses.  Exam reveals no gallop and no friction rub.   No murmur heard. Pulmonary/Chest: Effort normal and breath sounds normal. No stridor. No respiratory distress. He has no wheezes. He has no rales. He exhibits no tenderness.  Abdominal: Soft. Bowel sounds are normal. He exhibits no distension and no mass. There is tenderness (epigastric tenderness). There is no rebound and no guarding.  Musculoskeletal: Normal range of motion. He exhibits no edema or tenderness.  Lymphadenopathy:    He has no cervical adenopathy.  Neurological: He is alert and oriented to person, place, and time. He displays normal  reflexes. No cranial nerve deficit. He exhibits normal muscle tone. Coordination normal.  Skin: Skin is warm and dry. No rash noted. No erythema. No pallor.  Psychiatric: He has a normal mood and affect. His behavior is normal. Judgment and thought content normal.  Nursing note and vitals reviewed.   ED Course  Procedures (including critical care time) Labs Review Labs Reviewed  LIPASE, BLOOD - Abnormal; Notable for the following:    Lipase 61 (*)    All other components within normal limits    Imaging Review No results found.   EKG Interpretation None      MDM   Final diagnoses:  Bad headache  Other chronic pancreatitis    53 year old male with acute on chronic pain from chronic pancreatitis as well as headache.  His neuro exam is normal.  [facility] drug database accessed, patient received 120 tablets of Percocet 29 days ago from the [place].  I feel that patient is having acute exacerbation secondary to running out of his pain medications early.  I've advised the patient that given the nature of his headache, not comfortable treating him with narcotics.  Patient is told that he has run out of his narcotics and cannot get back into the [place] until Monday.  I advised him to call his primary care doctor today as they are open today and discuss need for pain medications.  Patient has not had any more vomiting while being here in the emergency department.  He reports his headache and abdominal pain have improved after treatment.  We'll plan to discharge home.    Kalman Drape, MD 11/11/14 [phone]

## 2014-11-11 NOTE — ED Notes (Signed)
Pt states he has chronic pancreatitis and has been having abd pain with vomiting for the past 2 days  Pt states he has a migraine headache as well

## 2014-11-11 NOTE — Discharge Instructions (Signed)
Chronic Pain Discharge Instructions  Emergency care providers appreciate that many patients coming to [place] are in severe pain and we wish to address their pain in the safest, most responsible manner.  It is important to recognize however, that the proper treatment of chronic pain differs from that of the pain of injuries and acute illnesses.  Our goal is to provide quality, safe, personalized care and we thank you for giving [place] the opportunity to serve you. The use of narcotics and related agents for chronic pain syndromes may lead to additional physical and psychological problems.  Nearly as many people die from prescription narcotics each year as die from car crashes.  Additionally, this risk is increased if such prescriptions are obtained from a variety of sources.  Therefore, only your primary care physician or a pain management specialist is able to safely treat such syndromes with narcotic medications long-term.    Documentation revealing such prescriptions have been sought from multiple sources may prohibit [place] from providing a refill or different narcotic medication.  Your name may be checked first through the [place].  This database is a record of controlled substance medication prescriptions that the patient has received.  This has been established by [facility] in an effort to eliminate the dangerous, and often life threatening, practice of obtaining multiple prescriptions from different medical providers.   If you have a chronic pain syndrome (i.e. chronic headaches, recurrent back or neck pain, dental pain, abdominal or pelvis pain without a specific diagnosis, or neuropathic pain such as fibromyalgia) or recurrent visits for the same condition without an acute diagnosis, you may be treated with non-narcotics and other non-addictive medicines.  Allergic reactions or negative side effects that may be reported by a patient to such medications will not  typically lead to the use of a narcotic analgesic or other controlled substance as an alternative.   Patients managing chronic pain with a personal physician should have provisions in place for breakthrough pain.  If you are in crisis, you should call your physician.  If your physician directs you to the emergency department, please have the doctor call and speak to our attending physician concerning your care.   When patients come to the Emergency Department (ED) with acute medical conditions in which the Emergency Department physician feels appropriate to prescribe narcotic or sedating pain medication, the physician will prescribe these in very limited quantities.  The amount of these medications will last only until you can see your primary care physician in his/her office.  Any patient who returns to the ED seeking refills should expect only non-narcotic pain medications.   In the event of an acute medical condition exists and the emergency physician feels it is necessary that the patient be given a narcotic or sedating medication -  a responsible adult driver should be present in the room prior to the medication being given by the nurse.   Prescriptions for narcotic or sedating medications that have been lost, stolen or expired will not be refilled in the Emergency Department.    Patients who have chronic pain may receive non-narcotic prescriptions until seen by their primary care physician.  It is every patients personal responsibility to maintain active prescriptions with his or her primary care physician or specialist. General Headache Without Cause A general headache is pain or discomfort felt around the head or neck area. The cause may not be found.  HOME CARE   Keep all doctor visits.  Only  take medicines as told by your doctor.  Lie down in a dark, quiet room when you have a headache.  Keep a journal to find out if certain things bring on headaches. For example, write down:  What  you eat and drink.  How much sleep you get.  Any change to your diet or medicines.  Relax by getting a massage or doing other relaxing activities.  Put ice or heat packs on the head and neck area as told by your doctor.  Lessen stress.  Sit up straight. Do not tighten (tense) your muscles.  Quit smoking if you smoke.  Lessen how much alcohol you drink.  Lessen how much caffeine you drink, or stop drinking caffeine.  Eat and sleep on a regular schedule.  Get 7 to 9 hours of sleep, or as told by your doctor.  Keep lights dim if bright lights bother you or make your headaches worse. GET HELP RIGHT AWAY IF:   Your headache becomes really bad.  You have a fever.  You have a stiff neck.  You have trouble seeing.  Your muscles are weak, or you lose muscle control.  You lose your balance or have trouble walking.  You feel like you will pass out (faint), or you pass out.  You have really bad symptoms that are different than your first symptoms.  You have problems with the medicines given to you by your doctor.  Your medicines do not work.  Your headache feels different than the other headaches.  You feel sick to your stomach (nauseous) or throw up (vomit). MAKE SURE YOU:   Understand these instructions.  Will watch your condition.  Will get help right away if you are not doing well or get worse. Document Released: 08/27/2008 Document Revised: 02/10/2012 Document Reviewed: 11/08/2011 [facility] Patient Information 2015 [place], [place]. This information is not intended to replace advice given to you by your health care provider. Make sure you discuss any questions you have with your health care provider.

## 2015-07-30 ENCOUNTER — Emergency Department (HOSPITAL_BASED_OUTPATIENT_CLINIC_OR_DEPARTMENT_OTHER)
Admission: EM | Admit: 2015-07-30 | Discharge: 2015-07-30 | Disposition: A | Discharge status: Home / Self Care | Payer: Commercial Managed Care - HMO | Attending: Emergency Medicine | Admitting: Emergency Medicine

## 2015-07-30 ENCOUNTER — Encounter (HOSPITAL_BASED_OUTPATIENT_CLINIC_OR_DEPARTMENT_OTHER): Payer: Self-pay | Admitting: *Deleted

## 2015-07-30 DIAGNOSIS — Z8719 Personal history of other diseases of the digestive system: Secondary | ICD-10-CM | POA: Insufficient documentation

## 2015-07-30 DIAGNOSIS — Z720 Tobacco use: Secondary | ICD-10-CM | POA: Diagnosis not present

## 2015-07-30 DIAGNOSIS — H7291 Unspecified perforation of tympanic membrane, right ear: Secondary | ICD-10-CM | POA: Insufficient documentation

## 2015-07-30 DIAGNOSIS — G8929 Other chronic pain: Secondary | ICD-10-CM | POA: Diagnosis not present

## 2015-07-30 DIAGNOSIS — Z8639 Personal history of other endocrine, nutritional and metabolic disease: Secondary | ICD-10-CM | POA: Diagnosis not present

## 2015-07-30 DIAGNOSIS — Z79899 Other long term (current) drug therapy: Secondary | ICD-10-CM | POA: Diagnosis not present

## 2015-07-30 DIAGNOSIS — H9201 Otalgia, right ear: Secondary | ICD-10-CM | POA: Diagnosis present

## 2015-07-30 DIAGNOSIS — F329 Major depressive disorder, single episode, unspecified: Secondary | ICD-10-CM | POA: Diagnosis not present

## 2015-07-30 MED ORDER — HYDROCODONE-ACETAMINOPHEN 5-325 MG PO TABS
1.0000 | ORAL_TABLET | Freq: Once | ORAL | Status: AC
  Administered 2015-07-30: 1 via ORAL
  Filled 2015-07-30: qty 1

## 2015-07-30 MED ORDER — NEOMYCIN-POLYMYXIN-HC 3.5-10000-1 OT SUSP: 3.0000 [drp] | Freq: Three times a day (TID) | OTIC | Status: DC

## 2015-07-30 MED ORDER — HYDROCODONE-ACETAMINOPHEN 5-325 MG PO TABS: 1.0000 | ORAL_TABLET | ORAL | Status: DC | PRN

## 2015-07-30 NOTE — Discharge Instructions (Signed)
Eardrum Perforation °The eardrum is a thin, round tissue inside the ear that separates the ear canal from the middle ear. This is the tissue that detects sound and enables you to hear. The eardrum can be punctured or torn (perforated). Eardrums generally heal without help and with little or no permanent hearing loss. °CAUSES  °· Sudden pressure changes that happen in situations like scuba diving or flying in an airplane. °· Foreign objects in the ear. °· Inserting a cotton-tipped swab in the ear. °· Loud noise. °· Trauma to the ear. °SYMPTOMS  °· Hearing loss. °· Ear pain. °· Ringing in the ears. °· Discharge or bleeding from the ear. °· Dizziness. °· Vomiting. °· Facial paralysis. °HOME CARE INSTRUCTIONS  °· Keep your ear dry, as this improves healing. Swimming, diving, and showers are not allowed until healing is complete. While bathing, protect the ear by placing a piece of cotton covered with petroleum jelly in the outer ear canal. °· Only take over-the-counter or prescription medicines for pain, discomfort, or fever as directed by your caregiver. °· Blow your nose gently. Forceful blowing increases the pressure in the middle ear and may cause further injury or delay healing. °· Resume normal activities, such as showering, when the perforation has healed. Your caregiver can let you know when this has occurred. °· Talk to your caregiver before flying on an airplane. Air travel is generally allowed with a perforated eardrum. °· If your caregiver has given you a follow-up appointment, it is very important to keep that appointment. Failure to keep the appointment could result in a chronic or permanent injury, pain, hearing loss, and disability. °SEEK IMMEDIATE MEDICAL CARE IF:  °· You have bleeding or pus coming from your ear. °· You have problems with balance, dizziness, nausea, or vomiting. °· You develop increased pain. °· You have a fever. °MAKE SURE YOU:  °· Understand these instructions. °· Will watch your  condition. °· Will get help right away if you are not doing well or get worse. °Document Released: 11/15/2000 Document Revised: 02/10/2012 Document Reviewed: 11/17/2008 °Exit[employer]® Patient Information ©2015 [employer]. This information is not intended to replace advice given to you by your health care provider. Make sure you discuss any questions you have with your health care provider. ° °

## 2015-07-30 NOTE — ED Notes (Signed)
Pt sts he was using a qtip to scratch inside of his right ear last night. When he removed the qtip, it was bloody. Pt sts this morning, he awoke with right sided HA and cannot hear via his right ear.

## 2015-07-30 NOTE — ED Provider Notes (Signed)
CSN: [id number]     Arrival date & time 07/30/15  7106 History   First MD Initiated Contact with Patient 07/30/15 0940     Chief Complaint  Patient presents with  . Otalgia      HPI  Patient presents for evaluation of ear pain. Was using a Q-tip in his ear yesterday and states it came out bloody and was painful and is worse this morning.  He feels pressure in his ear and decreased hearing.  Past Medical History  Diagnosis Date  . Chest pain   . Chronic pancreatitis   . 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 Migraine   . Seizure disorder     x 7 years  . Zollinger-Ellison syndrome   . GI bleed   . Chronic pain   . Migraine headache   . Chronic neck pain   . Pain management contract agreement   . Depression   . Seizures    Past Surgical History  Procedure Laterality Date  . Partial gastrectomy    . Eye surgery    . Hernia repair    . Small intestine surgery    . Appendectomy      per CT 2012- s/p appendectomy  . Cholecystectomy     Family History  Problem Relation Age of Onset  . Angina Mother   . Nephrolithiasis Father   . Cancer Brother    Social History  Substance Use Topics  . Smoking status: Current Every Day Smoker -- 2.00 packs/day    Types: Cigarettes    Last Attempt to Quit: 07/29/2000  . Smokeless tobacco: None     Comment: about 2 packs a day  . Alcohol Use: No    Review of Systems  Constitutional: Negative for fever, chills, diaphoresis, appetite change and fatigue.  HENT: Positive for ear pain. Negative for mouth sores, sore throat and trouble swallowing.        Blood from the right ear.  Eyes: Negative for visual disturbance.  Respiratory: Negative for cough, chest tightness, shortness of breath and wheezing.   Cardiovascular: Negative for chest pain.  Gastrointestinal: Negative for nausea, vomiting, abdominal pain, diarrhea and abdominal distention.  Endocrine: Negative for polydipsia, polyphagia and polyuria.  Genitourinary: Negative  for dysuria, frequency and hematuria.  Musculoskeletal: Negative for gait problem.  Skin: Negative for color change, pallor and rash.  Neurological: Negative for dizziness, syncope, light-headedness and headaches.  Hematological: Does not bruise/bleed easily.  Psychiatric/Behavioral: Negative for behavioral problems and confusion.      Allergies  Iohexol and Sonata  Home Medications   Prior to Admission medications   Medication Sig Start Date End Date Taking? Authorizing Provider  lipase/protease/amylase (CREON) 12000 UNITS CPEP capsule Take 12,000 Units by mouth 3 (three) times daily before meals.   Yes Historical Provider, MD  omeprazole (PRILOSEC) 20 MG capsule Take 20 mg by mouth 2 (two) times daily.   Yes Historical Provider, MD  traZODone (DESYREL) 50 MG tablet Take 50 mg by mouth at bedtime.   Yes Historical Provider, MD  dicyclomine (BENTYL) 20 MG tablet Take 1 tablet (20 mg total) by mouth every 6 (six) hours as needed for spasms (for abdominal cramping). 11/11/14   Linton Flemings, MD  HYDROcodone-acetaminophen (NORCO/VICODIN) 5-325 MG per tablet Take 1 tablet by mouth every 4 (four) hours as needed. 07/30/15   Tanna Furry, MD  neomycin-polymyxin-hydrocortisone (CORTISPORIN) 3.5-10000-1 otic suspension Place 3 drops into the right ear 3 (three) times daily.  07/30/15   Tanna Furry, MD  ondansetron (ZOFRAN) 4 MG tablet Take 1 tablet (4 mg total) by mouth every 6 (six) hours. 11/11/14   Linton Flemings, MD  oxyCODONE-acetaminophen (PERCOCET/ROXICET) 5-325 MG per tablet Take 1-2 tablets by mouth every 4 (four) hours as needed for severe pain. 10/11/14   Charlann Lange, PA-C  zolpidem (AMBIEN) 5 MG tablet Take 5 mg by mouth at bedtime as needed for sleep (insomnia).     Historical Provider, MD   BP 94/72 mmHg  Pulse 98  Temp(Src) 98.1 F (36.7 C) (Oral)  Resp 16  Ht 5' 5.5" (1.664 m)  Wt 112 lb (50.803 kg)  BMI 18.35 kg/m2  SpO2 98% Physical Exam  HENT:  Ears:    ED Course    Procedures (including critical care time) Labs Review Labs Reviewed - No data to display  Imaging Review No results found. I have personally reviewed and evaluated these images and lab results as part of my medical decision-making.   EKG Interpretation None      MDM   Final diagnoses:  Perforated tympanic membrane, right    Given Vicodin here. Prescription for some additional Vicodin for home. Cortisporin drop. ENT follow-up if not improving with his pain and hearing.  Nothing in your ear.    Tanna Furry, MD 07/30/15 [phone]

## 2015-07-30 NOTE — ED Notes (Signed)
MD at bedside. 

## 2015-09-05 ENCOUNTER — Encounter: Payer: Self-pay | Admitting: Emergency Medicine

## 2015-12-21 ENCOUNTER — Ambulatory Visit: Payer: Self-pay | Admitting: Emergency Medicine

## 2016-03-15 ENCOUNTER — Emergency Department (HOSPITAL_COMMUNITY)
Admission: EM | Admit: 2016-03-15 | Discharge: 2016-03-15 | Disposition: A | Discharge status: Home / Self Care | Payer: Commercial Managed Care - HMO | Attending: Emergency Medicine | Admitting: Emergency Medicine

## 2016-03-15 ENCOUNTER — Encounter (HOSPITAL_COMMUNITY): Payer: Self-pay

## 2016-03-15 DIAGNOSIS — F17210 Nicotine dependence, cigarettes, uncomplicated: Secondary | ICD-10-CM | POA: Insufficient documentation

## 2016-03-15 DIAGNOSIS — Z79899 Other long term (current) drug therapy: Secondary | ICD-10-CM | POA: Insufficient documentation

## 2016-03-15 DIAGNOSIS — F329 Major depressive disorder, single episode, unspecified: Secondary | ICD-10-CM | POA: Insufficient documentation

## 2016-03-15 DIAGNOSIS — G8929 Other chronic pain: Secondary | ICD-10-CM | POA: Diagnosis not present

## 2016-03-15 DIAGNOSIS — Z9049 Acquired absence of other specified parts of digestive tract: Secondary | ICD-10-CM | POA: Diagnosis not present

## 2016-03-15 DIAGNOSIS — R1013 Epigastric pain: Secondary | ICD-10-CM

## 2016-03-15 DIAGNOSIS — K859 Acute pancreatitis without necrosis or infection, unspecified: Secondary | ICD-10-CM | POA: Diagnosis not present

## 2016-03-15 DIAGNOSIS — R109 Unspecified abdominal pain: Secondary | ICD-10-CM | POA: Diagnosis not present

## 2016-03-15 DIAGNOSIS — K861 Other chronic pancreatitis: Secondary | ICD-10-CM | POA: Insufficient documentation

## 2016-03-15 DIAGNOSIS — Z8679 Personal history of other diseases of the circulatory system: Secondary | ICD-10-CM | POA: Diagnosis not present

## 2016-03-15 DIAGNOSIS — Z8639 Personal history of other endocrine, nutritional and metabolic disease: Secondary | ICD-10-CM | POA: Diagnosis not present

## 2016-03-15 LAB — CBC
HEMATOCRIT: 43.2 % (ref 39.0–52.0)
HEMOGLOBIN: 14.8 g/dL (ref 13.0–17.0)
MCH: 28.2 pg (ref 26.0–34.0)
MCHC: 34.3 g/dL (ref 30.0–36.0)
MCV: 82.3 fL (ref 78.0–100.0)
Platelets: 302 10*3/uL (ref 150–400)
RBC: 5.25 MIL/uL (ref 4.22–5.81)
RDW: 17.1 % — AB (ref 11.5–15.5)
WBC: 9.4 10*3/uL (ref 4.0–10.5)

## 2016-03-15 LAB — URINALYSIS, ROUTINE W REFLEX MICROSCOPIC
Bilirubin Urine: NEGATIVE
GLUCOSE, UA: NEGATIVE mg/dL
Hgb urine dipstick: NEGATIVE
KETONES UR: 40 mg/dL — AB
LEUKOCYTES UA: NEGATIVE
NITRITE: NEGATIVE
PH: 6 (ref 5.0–8.0)
Protein, ur: NEGATIVE mg/dL
SPECIFIC GRAVITY, URINE: 1.022 (ref 1.005–1.030)

## 2016-03-15 LAB — COMPREHENSIVE METABOLIC PANEL
ALBUMIN: 4 g/dL (ref 3.5–5.0)
ALT: 45 U/L (ref 17–63)
ANION GAP: 12 (ref 5–15)
AST: 45 U/L — AB (ref 15–41)
Alkaline Phosphatase: 191 U/L — ABNORMAL HIGH (ref 38–126)
BUN: 12 mg/dL (ref 6–20)
CHLORIDE: 100 mmol/L — AB (ref 101–111)
CO2: 23 mmol/L (ref 22–32)
Calcium: 9.5 mg/dL (ref 8.9–10.3)
Creatinine, Ser: 0.6 mg/dL — ABNORMAL LOW (ref 0.61–1.24)
GFR calc Af Amer: >60 mL/min (ref >60)
GLUCOSE: 112 mg/dL — AB (ref 65–99)
POTASSIUM: 4.5 mmol/L (ref 3.5–5.1)
Sodium: 135 mmol/L (ref 135–145)
TOTAL PROTEIN: 9.3 g/dL — AB (ref 6.5–8.1)
Total Bilirubin: 1.3 mg/dL — ABNORMAL HIGH (ref 0.3–1.2)

## 2016-03-15 LAB — LIPASE, BLOOD: LIPASE: 38 U/L (ref 11–51)

## 2016-03-15 MED ORDER — HYDROMORPHONE HCL 1 MG/ML IJ SOLN
1.0000 mg | Freq: Once | INTRAMUSCULAR | Status: AC
  Administered 2016-03-15: 1 mg via INTRAVENOUS
  Filled 2016-03-15: qty 1

## 2016-03-15 MED ORDER — ONDANSETRON HCL 4 MG/2ML IJ SOLN
4.0000 mg | Freq: Once | INTRAMUSCULAR | Status: AC
  Administered 2016-03-15: 4 mg via INTRAVENOUS
  Filled 2016-03-15: qty 2

## 2016-03-15 MED ORDER — SODIUM CHLORIDE 0.9 % IV BOLUS (SEPSIS)
1000.0000 mL | Freq: Once | INTRAVENOUS | Status: AC
  Administered 2016-03-15: 1000 mL via INTRAVENOUS

## 2016-03-15 NOTE — ED Notes (Signed)
MADE SECOND REQUEST FOR URINE,PT STILL UNABLE TO PROVIDE ONE AT THIS TIME.

## 2016-03-15 NOTE — Discharge Instructions (Signed)
Please follow-up closely with your primary care provider regarding further pain medications. Return for worsening symptoms, including fever, vomiting unable to keep down food or fluids, or any other symptoms concerning to you.  Abdominal Pain, Adult Many things can cause belly (abdominal) pain. Most times, the belly pain is not dangerous. Many cases of belly pain can be watched and treated at home. HOME CARE   Do not take medicines that help you go poop (laxatives) unless told to by your doctor.  Only take medicine as told by your doctor.  Eat or drink as told by your doctor. Your doctor will tell you if you should be on a special diet. GET HELP IF:  You do not know what is causing your belly pain.  You have belly pain while you are sick to your stomach (nauseous) or have runny poop (diarrhea).  You have pain while you pee or poop.  Your belly pain wakes you up at night.  You have belly pain that gets worse or better when you eat.  You have belly pain that gets worse when you eat fatty foods.  You have a fever. GET HELP RIGHT AWAY IF:   The pain does not go away within 2 hours.  You keep throwing up (vomiting).  The pain changes and is only in the right or left part of the belly.  You have bloody or tarry looking poop. MAKE SURE YOU:   Understand these instructions.  Will watch your condition.  Will get help right away if you are not doing well or get worse.   This information is not intended to replace advice given to you by your health care provider. Make sure you discuss any questions you have with your health care provider.   Document Released: 05/06/2008 Document Revised: 12/09/2014 Document Reviewed: 07/28/2013 Elsevier Interactive Patient Education [employer].

## 2016-03-15 NOTE — ED Notes (Signed)
MADE THIRD REQUEST FOR URINE PTD.,UNABLE TO PROVIDE ON AT THIS TIME. MADE PT AWARE WE MAY NEED TO START THINKING ABOUT A CATHETER. PT STATED THEY UNDERSTOOD

## 2016-03-15 NOTE — ED Notes (Addendum)
MADE FIRST REQUEST FOR URINE,PT UNABLE TO PROVIDE ONE AT THIS TIME.

## 2016-03-15 NOTE — ED Provider Notes (Signed)
CSN: [id number]     Arrival date & time 03/15/16  1225 History   First MD Initiated Contact with Patient 03/15/16 1358     Chief Complaint  Patient presents with  . Abdominal Pain  . Emesis     (Consider location/radiation/quality/duration/timing/severity/associated sxs/prior Treatment) HPI 55 year old male who presents with abdominal pain, nausea, vomiting, and loose stools. History of chronic pancreatitis, Zollinger-Ellison syndrome status post partial gastrectomy, appendectomy, and cholecystectomy. States 2 days of epigastric abdominal pain with nonbilious, nonbloody emesis and loose stools which is consistent with that of his acute on chronic pancreatitis. States that he is normally prescribed Percocet from his primary care doctor at the [place], but he ran out early this month. Denies any fever, chills, chest pain or difficulty breathing, dysuria, urinary frequency, or hematuria. Past Medical History  Diagnosis Date  . Chest pain   . Chronic pancreatitis ([place])   . 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 Migraine   . Seizure disorder ([place])     x 7 years  . Zollinger-Ellison syndrome   . GI bleed   . Chronic pain   . Migraine headache   . Chronic neck pain   . Pain management contract agreement   . Depression   . Seizures [facility])    Past Surgical History  Procedure Laterality Date  . Partial gastrectomy    . Eye surgery    . Hernia repair    . Small intestine surgery    . Appendectomy      per CT 2012- s/p appendectomy  . Cholecystectomy     Family History  Problem Relation Age of Onset  . Angina Mother   . Nephrolithiasis Father   . Cancer Brother    Social History  Substance Use Topics  . Smoking status: Current Every Day Smoker -- 2.00 packs/day    Types: Cigarettes    Last Attempt to Quit: 07/29/2000  . Smokeless tobacco: None     Comment: about 2 packs a day  . Alcohol Use: No    Review of Systems 10/14 systems reviewed and are negative other than those  stated in the HPI   Allergies  Iohexol and Sonata  Home Medications   Prior to Admission medications   Medication Sig Start Date End Date Taking? Authorizing Provider  omeprazole (PRILOSEC) 20 MG capsule Take 20 mg by mouth 2 (two) times daily.   Yes Historical Provider, MD  oxyCODONE-acetaminophen (PERCOCET/ROXICET) 5-325 MG per tablet Take 1-2 tablets by mouth every 4 (four) hours as needed for severe pain. 10/11/14  Yes Shari Upstill, PA-C  traZODone (DESYREL) 50 MG tablet Take 50 mg by mouth at bedtime.   Yes Historical Provider, MD  dicyclomine (BENTYL) 20 MG tablet Take 1 tablet (20 mg total) by mouth every 6 (six) hours as needed for spasms (for abdominal cramping). Patient not taking: Reported on 03/15/2016 11/11/14   Linton Flemings, MD  HYDROcodone-acetaminophen (NORCO/VICODIN) 5-325 MG per tablet Take 1 tablet by mouth every 4 (four) hours as needed. Patient not taking: Reported on 03/15/2016 07/30/15   Tanna Furry, MD  neomycin-polymyxin-hydrocortisone (CORTISPORIN) 3.5-10000-1 otic suspension Place 3 drops into the right ear 3 (three) times daily. Patient not taking: Reported on 03/15/2016 07/30/15   Tanna Furry, MD  ondansetron (ZOFRAN) 4 MG tablet Take 1 tablet (4 mg total) by mouth every 6 (six) hours. Patient not taking: Reported on 03/15/2016 11/11/14   Linton Flemings, MD   BP 111/79 mmHg  Pulse  85  Temp(Src) 98.9 F (37.2 C) (Oral)  Resp 16  SpO2 99% Physical Exam Physical Exam  Nursing note and vitals reviewed. Constitutional: thin, non-toxic, and in no acute distress Head: Normocephalic and atraumatic.  Mouth/Throat: Oropharynx is clear and dry.  Neck: Normal range of motion. Neck supple.  Cardiovascular: Normal rate and regular rhythm.  no edema. Pulmonary/Chest: Effort normal and breath sounds normal.  Abdominal: Soft. There is epigastric tenderness. There is no rebound and no guarding.  Musculoskeletal: Normal range of motion.  Neurological: Alert, no facial droop,  fluent speech, moves all extremities symmetrically Skin: Skin is warm and dry.  Psychiatric: Cooperative  ED Course  Procedures (including critical care time) Labs Review Labs Reviewed  COMPREHENSIVE METABOLIC PANEL - Abnormal; Notable for the following:    Chloride 100 (*)    Glucose, Bld 112 (*)    Creatinine, Ser 0.60 (*)    Total Protein 9.3 (*)    AST 45 (*)    Alkaline Phosphatase 191 (*)    Total Bilirubin 1.3 (*)    All other components within normal limits  CBC - Abnormal; Notable for the following:    RDW 17.1 (*)    All other components within normal limits  URINALYSIS, ROUTINE W REFLEX MICROSCOPIC (NOT AT [facility]) - Abnormal; Notable for the following:    Ketones, ur 40 (*)    All other components within normal limits  LIPASE, BLOOD    Imaging Review No results found. I have personally reviewed and evaluated these images and lab results as part of my medical decision-making.   EKG Interpretation None      MDM   Final diagnoses:  Acute on chronic pancreatitis [facility])    55 year old male who presents with epigastric abdominal pain with nausea, vomiting, loose stools. He does appear mildly dry on exam with a soft and benign abdomen. Focal epigastric tenderness to palpation. He is afebrile with normal vital signs. Basic blood work overall unremarkable. Is given IV fluids, antibiotics, and analgesics. On reevaluation, he states that he thinks he is well enough to tolerate by mouth. States that I will be unable to provide him with a prescription for narcotics that he needs to follow up closely with his primary care doctor through the [place]. Strict return instructions are also reviewed. He expressed understanding of all discharge instructions, and felt comfortable to plan of care.    Forde Dandy, MD 03/15/16 [phone]

## 2016-03-15 NOTE — ED Notes (Signed)
Per EMS, Pt, from home, c/o generalized abdominal pain and n/v/d x 2 days.  Pain score 8/10.  Pt of chronic pancreatitis.

## 2016-03-15 NOTE — ED Notes (Signed)
Pt given gingerale and a sandwich by MD for a PO challenge.

## 2016-03-17 ENCOUNTER — Emergency Department (HOSPITAL_BASED_OUTPATIENT_CLINIC_OR_DEPARTMENT_OTHER): Payer: Commercial Managed Care - HMO

## 2016-03-17 ENCOUNTER — Encounter (HOSPITAL_BASED_OUTPATIENT_CLINIC_OR_DEPARTMENT_OTHER): Payer: Self-pay | Admitting: Emergency Medicine

## 2016-03-17 ENCOUNTER — Emergency Department (HOSPITAL_BASED_OUTPATIENT_CLINIC_OR_DEPARTMENT_OTHER)
Admission: EM | Admit: 2016-03-17 | Discharge: 2016-03-17 | Disposition: A | Discharge status: Home / Self Care | Payer: Commercial Managed Care - HMO | Attending: Emergency Medicine | Admitting: Emergency Medicine

## 2016-03-17 DIAGNOSIS — F17210 Nicotine dependence, cigarettes, uncomplicated: Secondary | ICD-10-CM | POA: Diagnosis not present

## 2016-03-17 DIAGNOSIS — Z8679 Personal history of other diseases of the circulatory system: Secondary | ICD-10-CM | POA: Diagnosis not present

## 2016-03-17 DIAGNOSIS — R112 Nausea with vomiting, unspecified: Secondary | ICD-10-CM | POA: Diagnosis not present

## 2016-03-17 DIAGNOSIS — R1084 Generalized abdominal pain: Secondary | ICD-10-CM | POA: Diagnosis not present

## 2016-03-17 DIAGNOSIS — R1033 Periumbilical pain: Secondary | ICD-10-CM | POA: Diagnosis not present

## 2016-03-17 DIAGNOSIS — R109 Unspecified abdominal pain: Secondary | ICD-10-CM | POA: Diagnosis not present

## 2016-03-17 DIAGNOSIS — Z8639 Personal history of other endocrine, nutritional and metabolic disease: Secondary | ICD-10-CM | POA: Insufficient documentation

## 2016-03-17 DIAGNOSIS — G8929 Other chronic pain: Secondary | ICD-10-CM | POA: Diagnosis not present

## 2016-03-17 DIAGNOSIS — F329 Major depressive disorder, single episode, unspecified: Secondary | ICD-10-CM | POA: Insufficient documentation

## 2016-03-17 DIAGNOSIS — K5900 Constipation, unspecified: Secondary | ICD-10-CM | POA: Insufficient documentation

## 2016-03-17 DIAGNOSIS — Z79899 Other long term (current) drug therapy: Secondary | ICD-10-CM | POA: Insufficient documentation

## 2016-03-17 LAB — CBC WITH DIFFERENTIAL/PLATELET
BASOS ABS: 0 10*3/uL (ref 0.0–0.1)
BASOS PCT: 0 %
Eosinophils Absolute: 0 10*3/uL (ref 0.0–0.7)
Eosinophils Relative: 0 %
HEMATOCRIT: 41.4 % (ref 39.0–52.0)
HEMOGLOBIN: 13.8 g/dL (ref 13.0–17.0)
Lymphocytes Relative: 24 %
Lymphs Abs: 1.8 10*3/uL (ref 0.7–4.0)
MCH: 27.4 pg (ref 26.0–34.0)
MCHC: 33.3 g/dL (ref 30.0–36.0)
MCV: 82.3 fL (ref 78.0–100.0)
MONOS PCT: 10 %
Monocytes Absolute: 0.7 10*3/uL (ref 0.1–1.0)
NEUTROS ABS: 4.7 10*3/uL (ref 1.7–7.7)
NEUTROS PCT: 66 %
Platelets: 270 10*3/uL (ref 150–400)
RBC: 5.03 MIL/uL (ref 4.22–5.81)
RDW: 17.7 % — ABNORMAL HIGH (ref 11.5–15.5)
WBC: 7.2 10*3/uL (ref 4.0–10.5)

## 2016-03-17 LAB — COMPREHENSIVE METABOLIC PANEL
ALBUMIN: 3.8 g/dL (ref 3.5–5.0)
ALK PHOS: 149 U/L — AB (ref 38–126)
ALT: 33 U/L (ref 17–63)
AST: 38 U/L (ref 15–41)
Anion gap: 6 (ref 5–15)
BILIRUBIN TOTAL: 0.6 mg/dL (ref 0.3–1.2)
BUN: 16 mg/dL (ref 6–20)
CALCIUM: 9 mg/dL (ref 8.9–10.3)
CO2: 25 mmol/L (ref 22–32)
Chloride: 100 mmol/L — ABNORMAL LOW (ref 101–111)
Creatinine, Ser: 0.53 mg/dL — ABNORMAL LOW (ref 0.61–1.24)
GFR calc Af Amer: >60 mL/min (ref >60)
GFR calc non Af Amer: >60 mL/min (ref >60)
GLUCOSE: 105 mg/dL — AB (ref 65–99)
Potassium: 3.8 mmol/L (ref 3.5–5.1)
Sodium: 131 mmol/L — ABNORMAL LOW (ref 135–145)
Total Protein: 8.6 g/dL — ABNORMAL HIGH (ref 6.5–8.1)

## 2016-03-17 LAB — LIPASE, BLOOD: Lipase: 34 U/L (ref 11–51)

## 2016-03-17 MED ORDER — ONDANSETRON HCL 4 MG/2ML IJ SOLN
4.0000 mg | INTRAMUSCULAR | Status: AC
  Administered 2016-03-17: 4 mg via INTRAVENOUS
  Filled 2016-03-17: qty 2

## 2016-03-17 MED ORDER — SODIUM CHLORIDE 0.9 % IV BOLUS (SEPSIS)
1000.0000 mL | Freq: Once | INTRAVENOUS | Status: AC
  Administered 2016-03-17: 1000 mL via INTRAVENOUS

## 2016-03-17 MED ORDER — HYDROMORPHONE HCL 1 MG/ML IJ SOLN
1.0000 mg | Freq: Once | INTRAMUSCULAR | Status: AC
  Administered 2016-03-17: 1 mg via INTRAVENOUS
  Filled 2016-03-17: qty 1

## 2016-03-17 MED ORDER — KETOROLAC TROMETHAMINE 30 MG/ML IJ SOLN
30.0000 mg | Freq: Once | INTRAMUSCULAR | Status: AC
  Administered 2016-03-17: 30 mg via INTRAVENOUS
  Filled 2016-03-17: qty 1

## 2016-03-17 NOTE — Discharge Instructions (Signed)
1. Medications: usual home medications 2. Treatment: rest, drink plenty of fluids 3. Follow Up: please followup with your primary doctor for discussion of your diagnoses and further evaluation after today's visit; if you do not have a primary care doctor use the phone number listed in your discharge paperwork to find one; please return to the ER for new or worsening symptoms   Abdominal Pain, Adult Many things can cause abdominal pain. Usually, abdominal pain is not caused by a disease and will improve without treatment. It can often be observed and treated at home. Your health care provider will do a physical exam and possibly order blood tests and X-rays to help determine the seriousness of your pain. However, in many cases, more time must pass before a clear cause of the pain can be found. Before that point, your health care provider may not know if you need more testing or further treatment. HOME CARE INSTRUCTIONS Monitor your abdominal pain for any changes. The following actions may help to alleviate any discomfort you are experiencing:  Only take over-the-counter or prescription medicines as directed by your health care provider.  Do not take laxatives unless directed to do so by your health care provider.  Try a clear liquid diet (broth, tea, or water) as directed by your health care provider. Slowly move to a bland diet as tolerated. SEEK MEDICAL CARE IF:  You have unexplained abdominal pain.  You have abdominal pain associated with nausea or diarrhea.  You have pain when you urinate or have a bowel movement.  You experience abdominal pain that wakes you in the night.  You have abdominal pain that is worsened or improved by eating food.  You have abdominal pain that is worsened with eating fatty foods.  You have a fever. SEEK IMMEDIATE MEDICAL CARE IF:  Your pain does not go away within 2 hours.  You keep throwing up (vomiting).  Your pain is felt only in portions of the  abdomen, such as the right side or the left lower portion of the abdomen.  You pass bloody or black tarry stools. MAKE SURE YOU:  Understand these instructions.  Will watch your condition.  Will get help right away if you are not doing well or get worse.   This information is not intended to replace advice given to you by your health care provider. Make sure you discuss any questions you have with your health care provider.   Document Released: 08/28/2005 Document Revised: 08/09/2015 Document Reviewed: 07/28/2013 Elsevier Interactive Patient Education [employer].

## 2016-03-17 NOTE — ED Notes (Signed)
Pt in c/o abd pain and emesis onset 4 days ago. Hx of pancreatitis and bowel obstructions. NAD, no emesis appreciated at this time.

## 2016-03-17 NOTE — ED Provider Notes (Signed)
CSN: [id number]     Arrival date & time 03/17/16  1430 History   First MD Initiated Contact with Patient 03/17/16 1556     Chief Complaint  Patient presents with  . Abdominal Pain  . Emesis    HPI   Jerry Knight is a 55 y.o. male with a PMH of chronic pancreatitis, zollinger-ellison syndrome, chronic pain, GI bleed who presents to the ED with generalized abdominal pain, which he states started Thursday and has been constant since that time. He denies exacerbating or alleviating factors. He states he was evaluated on Friday for the same symptoms, and was discharged after symptom improvement. He notes his pain feels similar to his history of pancreatitis. He reports associated nausea and vomiting. He denies diarrhea. He states his last bowel movement was yesterday. He denies fever, chills, dysuria, urgency, frequency.   Past Medical History  Diagnosis Date  . Chest pain   . Chronic pancreatitis ([place])   . 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 Migraine   . Seizure disorder ([place])     x 7 years  . Zollinger-Ellison syndrome   . GI bleed   . Chronic pain   . Migraine headache   . Chronic neck pain   . Pain management contract agreement   . Depression   . Seizures [facility])    Past Surgical History  Procedure Laterality Date  . Partial gastrectomy    . Eye surgery    . Hernia repair    . Small intestine surgery    . Appendectomy      per CT 2012- s/p appendectomy  . Cholecystectomy     Family History  Problem Relation Age of Onset  . Angina Mother   . Nephrolithiasis Father   . Cancer Brother    Social History  Substance Use Topics  . Smoking status: Current Every Day Smoker -- 2.00 packs/day    Types: Cigarettes    Last Attempt to Quit: 07/29/2000  . Smokeless tobacco: None     Comment: about 2 packs a day  . Alcohol Use: No      Review of Systems  Constitutional: Negative for fever and chills.  Gastrointestinal: Positive for nausea, vomiting, abdominal pain and  constipation. Negative for diarrhea and blood in stool.  Genitourinary: Negative for dysuria, urgency and frequency.  All other systems reviewed and are negative.     Allergies  Iohexol and Sonata  Home Medications   Prior to Admission medications   Medication Sig Start Date End Date Taking? Authorizing Provider  dicyclomine (BENTYL) 20 MG tablet Take 1 tablet (20 mg total) by mouth every 6 (six) hours as needed for spasms (for abdominal cramping). Patient not taking: Reported on 03/15/2016 11/11/14   Linton Flemings, MD  HYDROcodone-acetaminophen (NORCO/VICODIN) 5-325 MG per tablet Take 1 tablet by mouth every 4 (four) hours as needed. Patient not taking: Reported on 03/15/2016 07/30/15   Tanna Furry, MD  neomycin-polymyxin-hydrocortisone (CORTISPORIN) 3.5-10000-1 otic suspension Place 3 drops into the right ear 3 (three) times daily. Patient not taking: Reported on 03/15/2016 07/30/15   Tanna Furry, MD  omeprazole (PRILOSEC) 20 MG capsule Take 20 mg by mouth 2 (two) times daily.    Historical Provider, MD  ondansetron (ZOFRAN) 4 MG tablet Take 1 tablet (4 mg total) by mouth every 6 (six) hours. Patient not taking: Reported on 03/15/2016 11/11/14   Linton Flemings, MD  oxyCODONE-acetaminophen (PERCOCET/ROXICET) 5-325 MG per tablet Take 1-2 tablets  by mouth every 4 (four) hours as needed for severe pain. 10/11/14   Charlann Lange, PA-C  traZODone (DESYREL) 50 MG tablet Take 50 mg by mouth at bedtime.    Historical Provider, MD    BP 104/78 mmHg  Pulse 74  Temp(Src) 98.2 F (36.8 C) (Oral)  Resp 18  Ht 5\' 5"  (1.651 m)  Wt 49.442 kg  BMI 18.14 kg/m2  SpO2 98% Physical Exam  Constitutional: He is oriented to person, place, and time. He appears well-developed and well-nourished. No distress.  HENT:  Head: Normocephalic and atraumatic.  Right Ear: External ear normal.  Left Ear: External ear normal.  Nose: Nose normal.  Mouth/Throat: Uvula is midline, oropharynx is clear and moist and mucous  membranes are normal.  Eyes: Conjunctivae, EOM and lids are normal. Pupils are equal, round, and reactive to light. Right eye exhibits no discharge. Left eye exhibits no discharge. No scleral icterus.  Neck: Normal range of motion. Neck supple.  Cardiovascular: Normal rate, regular rhythm, normal heart sounds, intact distal pulses and normal pulses.   Pulmonary/Chest: Effort normal and breath sounds normal. No respiratory distress. He has no wheezes. He has no rales.  Abdominal: Soft. Normal appearance and bowel sounds are normal. He exhibits no distension and no mass. There is tenderness. There is no rigidity, no rebound and no guarding.  Mild diffuse TTP, worse in periumbilical region.  Musculoskeletal: Normal range of motion. He exhibits no edema or tenderness.  Neurological: He is alert and oriented to person, place, and time. He has normal strength. No sensory deficit.  Skin: Skin is warm, dry and intact. No rash noted. He is not diaphoretic. No erythema. No pallor.  Psychiatric: He has a normal mood and affect. His speech is normal and behavior is normal.  Nursing note and vitals reviewed.   ED Course  Procedures (including critical care time)  Labs Review Labs Reviewed  CBC WITH DIFFERENTIAL/PLATELET - Abnormal; Notable for the following:    RDW 17.7 (*)    All other components within normal limits  COMPREHENSIVE METABOLIC PANEL - Abnormal; Notable for the following:    Sodium 131 (*)    Chloride 100 (*)    Glucose, Bld 105 (*)    Creatinine, Ser 0.53 (*)    Total Protein 8.6 (*)    Alkaline Phosphatase 149 (*)    All other components within normal limits  LIPASE, BLOOD    Imaging Review Dg Abd 2 Views  03/17/2016  CLINICAL DATA:  Abdominal pain with nausea and vomiting. History of chronic pancreatitis. EXAM: ABDOMEN - 2 VIEW COMPARISON:  Radiographs 10/21/2014 and 09/15/2014 CT 07/11/2014. FINDINGS: The bowel gas pattern is nonobstructive. There are stable postsurgical  changes within the left abdomen with several bowel anastomosis clips. Pelvic bowel anastomosis clips are also present. There is no supine evidence of free intraperitoneal air. There is a possible new 7 mm right pelvic calcification. Small left pelvic calcifications appear unchanged. The osseous structures appear stable. IMPRESSION: Normal bowel gas pattern status post abdominal surgery. Possible new right pelvic calcification, nonspecific. Electronically Signed   By: Richardean Sale M.D.   On: 03/17/2016 18:07   I have personally reviewed and evaluated these images and lab results as part of my medical decision-making.   EKG Interpretation None      MDM   Final diagnoses:  Abdominal pain    55 year old male presents with generalized abdominal pain. Notes associated nausea and vomiting. States his last bowel movement was  yesterday. Denies fever, chills. Patient is afebrile. Vital signs stable. Abdomen soft, non-distended, with mild diffuse TTP, worse in periumbilical region. No rebound, guarding, or masses. Labs pending. Will give pain medication, antiemetic, fluids. CBC negative for leukocytosis or anemia. CMP remarkable for alkaline phosphatase 149, which appears chronic. Lipase within normal limits. Imaging negative for obstruction. Patient reports significant symptom improvement. He is nontoxic and well-appearing, feel he is stable for discharge at this time. Low suspicion for emergent intra-abdominal etiology given benign exam and reassuring work-up. Patient to follow up with PCP. Return precautions discussed. Patient verbalizes his understanding and is in agreement with plan.  BP 104/78 mmHg  Pulse 74  Temp(Src) 98.2 F (36.8 C) (Oral)  Resp 18  Ht 5\' 5"  (1.651 m)  Wt 49.442 kg  BMI 18.14 kg/m2  SpO2 98%     Marella Chimes, PA-C 03/18/16 [id number]  Blanchie Dessert, MD 03/20/16 1432

## 2016-09-23 ENCOUNTER — Emergency Department (HOSPITAL_BASED_OUTPATIENT_CLINIC_OR_DEPARTMENT_OTHER)
Admission: EM | Admit: 2016-09-23 | Discharge: 2016-09-23 | Disposition: A | Discharge status: Home / Self Care | Payer: Commercial Managed Care - HMO | Attending: Physician Assistant | Admitting: Physician Assistant

## 2016-09-23 ENCOUNTER — Encounter (HOSPITAL_BASED_OUTPATIENT_CLINIC_OR_DEPARTMENT_OTHER): Payer: Self-pay

## 2016-09-23 DIAGNOSIS — F17210 Nicotine dependence, cigarettes, uncomplicated: Secondary | ICD-10-CM | POA: Diagnosis not present

## 2016-09-23 DIAGNOSIS — H16002 Unspecified corneal ulcer, left eye: Secondary | ICD-10-CM | POA: Insufficient documentation

## 2016-09-23 DIAGNOSIS — Z79899 Other long term (current) drug therapy: Secondary | ICD-10-CM | POA: Insufficient documentation

## 2016-09-23 DIAGNOSIS — H5712 Ocular pain, left eye: Secondary | ICD-10-CM | POA: Diagnosis present

## 2016-09-23 MED ORDER — FLUORESCEIN SODIUM 1 MG OP STRP
ORAL_STRIP | OPHTHALMIC | Status: AC
  Administered 2016-09-23: 1
  Filled 2016-09-23: qty 1

## 2016-09-23 MED ORDER — TETRACAINE HCL 0.5 % OP SOLN
OPHTHALMIC | Status: AC
  Administered 2016-09-23: 2 [drp]
  Filled 2016-09-23: qty 4

## 2016-09-23 MED ORDER — ERYTHROMYCIN 5 MG/GM OP OINT
TOPICAL_OINTMENT | Freq: Once | OPHTHALMIC | Status: AC
  Administered 2016-09-23: 1 via OPHTHALMIC
  Filled 2016-09-23: qty 3.5

## 2016-09-23 NOTE — Discharge Instructions (Signed)
We were able to schedule an appointment today at 2:30 PM. Please follow-up immediately with this appointment.

## 2016-09-23 NOTE — ED Triage Notes (Addendum)
Pt reports left eye burning, itching, drainage noted this morning, pink, associated light sensitivity. Denies injury.

## 2016-09-23 NOTE — ED Provider Notes (Signed)
[facility] DEPT [facility] Provider Note   CSN: [id number] Arrival date & time: 09/23/16  [id number]     History   Chief Complaint Chief Complaint  Patient presents with  . Eye Pain    HPI Jerry Knight is a 55 y.o. male.  HPI   Patient is a 55 year old presenting with left eye pain. Patient noted that it started several days ago. He's had increasing pain, discharge in his left eye. Some photosensitivity. Patient is crusting that started in the last day and half.  Patient is that he previously had a corneal abrasion on his right eye due to the way that he sleeps. Patient has no ophthalmologist. He is a [place] patient.  Past Medical History:  Diagnosis Date  . Chest pain   . Chronic neck pain   . Chronic pain   . Chronic pancreatitis ([place])   . Degenerative disk disease    C 5 &C 6  . Depression   . GI bleed   . Migraine   . Migraine headache   . Pain management contract agreement   . Seizure disorder ([place])    x 7 years  . Seizures ([place])   . Zollinger-Ellison syndrome     Patient Active Problem List   Diagnosis Date Noted  . Loss of weight 01/22/2013  . Hypokalemia 01/22/2013  . Substance abuse 01/22/2013  . Pancreatitis, chronic ([place]) 08/27/2012  . Abdominal pain, epigastric 08/27/2012  . Chest pain 01/26/2012  . SOB (shortness of breath) 01/26/2012    Past Surgical History:  Procedure Laterality Date  . APPENDECTOMY     per CT 2012- s/p appendectomy  . CHOLECYSTECTOMY    . EYE SURGERY    . HERNIA REPAIR    . PARTIAL GASTRECTOMY    . SMALL INTESTINE SURGERY         Home Medications    Prior to Admission medications   Medication Sig Start Date End Date Taking? Authorizing Provider  omeprazole (PRILOSEC) 20 MG capsule Take 20 mg by mouth 2 (two) times daily.   Yes Historical Provider, MD  ondansetron (ZOFRAN) 4 MG tablet Take 1 tablet (4 mg total) by mouth every 6 (six) hours. 11/11/14  Yes Linton Flemings, MD  oxyCODONE-acetaminophen (PERCOCET/ROXICET) 5-325  MG per tablet Take 1-2 tablets by mouth every 4 (four) hours as needed for severe pain. 10/11/14  Yes Shari Upstill, PA-C  traZODone (DESYREL) 50 MG tablet Take 50 mg by mouth at bedtime.   Yes Historical Provider, MD  dicyclomine (BENTYL) 20 MG tablet Take 1 tablet (20 mg total) by mouth every 6 (six) hours as needed for spasms (for abdominal cramping). Patient not taking: Reported on 03/15/2016 11/11/14   Linton Flemings, MD  HYDROcodone-acetaminophen (NORCO/VICODIN) 5-325 MG per tablet Take 1 tablet by mouth every 4 (four) hours as needed. Patient not taking: Reported on 03/15/2016 07/30/15   Tanna Furry, MD  neomycin-polymyxin-hydrocortisone (CORTISPORIN) 3.5-10000-1 otic suspension Place 3 drops into the right ear 3 (three) times daily. Patient not taking: Reported on 03/15/2016 07/30/15   Tanna Furry, MD    Family History Family History  Problem Relation Age of Onset  . Angina Mother   . Nephrolithiasis Father   . Cancer Brother     Social History Social History  Substance Use Topics  . Smoking status: Current Every Day Smoker    Packs/day: 2.00    Types: Cigarettes    Last attempt to quit: 07/29/2000  . Smokeless tobacco: Never Used     Comment:  about 2 packs a day  . Alcohol use No     Allergies   Iohexol and Sonata [zaleplon]   Review of Systems Review of Systems  Constitutional: Negative for fatigue and fever.  HENT: Negative for congestion, drooling, ear discharge and rhinorrhea.   Eyes: Positive for photophobia, pain, discharge, redness and visual disturbance.  Gastrointestinal: Negative for abdominal pain.  All other systems reviewed and are negative.    Physical Exam Updated Vital Signs BP 113/77 (BP Location: Left Arm)   Pulse 80   Temp 97.6 F (36.4 C) (Oral)   Resp 18   Ht 5\' 5"  (1.651 m)   Wt 107 lb (48.5 kg)   SpO2 100%   BMI 17.81 kg/m   Physical Exam  Constitutional: He is oriented to person, place, and time. He appears well-nourished.  HENT:    Head: Normocephalic.  Eyes:  Left eye with area that appears to be corneal ulceration. Surrounding injected conjunctiva. Pupils equal round responsive. Discharge left eye.  No lesions noted in dermatome otherwise.  Cardiovascular: Normal rate.   Pulmonary/Chest: Effort normal.  Neurological: He is oriented to person, place, and time.  Skin: Skin is warm and dry. He is not diaphoretic.  Psychiatric: He has a normal mood and affect. His behavior is normal.     ED Treatments / Results  Labs (all labs ordered are listed, but only abnormal results are displayed) Labs Reviewed - No data to display  EKG  EKG Interpretation None       Radiology No results found.  Procedures Procedures (including critical care time)  Medications Ordered in ED Medications - No data to display   Initial Impression / Assessment and Plan / ED Course  I have reviewed the triage vital signs and the nursing notes.  Pertinent labs & imaging results that were available during my care of the patient were reviewed by me and considered in my medical decision making (see chart for details).  Clinical Course    Patient is a 55 year old male presenting with left eye pain over the last several days. Patient noted that his been getting worse and he has been turned Discharge. Patient does notice mild difficulty with visual acuity. Her visual acuity shows only mild decrease on the left-hand side. Patient's eye on physical exam is significant for what appears to be an ulceration of the cornea. Patient is surrounding ecchymosis and injected conjunctiva.  Woods lamp shows uptake by lesion.  Will discuss with ophthalmologist on-call  Discussed with ophtho, they will see him at 2:30 pm, patient agrees. Gave abx drops in the meantime.      Final Clinical Impressions(s) / ED Diagnoses   Final diagnoses:  None    New Prescriptions New Prescriptions   No medications on file     Quinnlan Abruzzo Julio Alm,  MD 09/23/16 1340

## 2017-03-31 DIAGNOSIS — Z131 Encounter for screening for diabetes mellitus: Secondary | ICD-10-CM | POA: Diagnosis not present

## 2017-03-31 DIAGNOSIS — Z01118 Encounter for examination of ears and hearing with other abnormal findings: Secondary | ICD-10-CM | POA: Diagnosis not present

## 2017-03-31 DIAGNOSIS — Z136 Encounter for screening for cardiovascular disorders: Secondary | ICD-10-CM | POA: Diagnosis not present

## 2017-03-31 DIAGNOSIS — Z113 Encounter for screening for infections with a predominantly sexual mode of transmission: Secondary | ICD-10-CM | POA: Diagnosis not present

## 2017-03-31 DIAGNOSIS — K861 Other chronic pancreatitis: Secondary | ICD-10-CM | POA: Diagnosis not present

## 2017-03-31 DIAGNOSIS — Z5181 Encounter for therapeutic drug level monitoring: Secondary | ICD-10-CM | POA: Diagnosis not present

## 2017-03-31 DIAGNOSIS — Z0000 Encounter for general adult medical examination without abnormal findings: Secondary | ICD-10-CM | POA: Diagnosis not present

## 2017-03-31 DIAGNOSIS — R0602 Shortness of breath: Secondary | ICD-10-CM | POA: Diagnosis not present

## 2017-03-31 DIAGNOSIS — F1129 Opioid dependence with unspecified opioid-induced disorder: Secondary | ICD-10-CM | POA: Diagnosis not present

## 2017-04-02 DIAGNOSIS — J449 Chronic obstructive pulmonary disease, unspecified: Secondary | ICD-10-CM | POA: Diagnosis not present

## 2017-04-02 DIAGNOSIS — R7303 Prediabetes: Secondary | ICD-10-CM | POA: Diagnosis not present

## 2017-04-02 DIAGNOSIS — K861 Other chronic pancreatitis: Secondary | ICD-10-CM | POA: Diagnosis not present

## 2017-04-02 DIAGNOSIS — F1129 Opioid dependence with unspecified opioid-induced disorder: Secondary | ICD-10-CM | POA: Diagnosis not present

## 2017-04-02 DIAGNOSIS — Z720 Tobacco use: Secondary | ICD-10-CM | POA: Diagnosis not present

## 2017-04-02 DIAGNOSIS — E611 Iron deficiency: Secondary | ICD-10-CM | POA: Diagnosis not present

## 2017-04-02 DIAGNOSIS — E785 Hyperlipidemia, unspecified: Secondary | ICD-10-CM | POA: Diagnosis not present

## 2017-04-14 DIAGNOSIS — E8809 Other disorders of plasma-protein metabolism, not elsewhere classified: Secondary | ICD-10-CM | POA: Diagnosis not present

## 2017-04-14 DIAGNOSIS — K861 Other chronic pancreatitis: Secondary | ICD-10-CM | POA: Diagnosis not present

## 2017-04-14 DIAGNOSIS — E559 Vitamin D deficiency, unspecified: Secondary | ICD-10-CM | POA: Diagnosis not present

## 2017-04-14 DIAGNOSIS — Z720 Tobacco use: Secondary | ICD-10-CM | POA: Diagnosis not present

## 2017-04-14 DIAGNOSIS — E785 Hyperlipidemia, unspecified: Secondary | ICD-10-CM | POA: Diagnosis not present

## 2017-04-14 DIAGNOSIS — R7303 Prediabetes: Secondary | ICD-10-CM | POA: Diagnosis not present

## 2017-04-14 DIAGNOSIS — F1129 Opioid dependence with unspecified opioid-induced disorder: Secondary | ICD-10-CM | POA: Diagnosis not present

## 2017-04-14 DIAGNOSIS — E611 Iron deficiency: Secondary | ICD-10-CM | POA: Diagnosis not present

## 2017-04-14 DIAGNOSIS — J449 Chronic obstructive pulmonary disease, unspecified: Secondary | ICD-10-CM | POA: Diagnosis not present

## 2017-04-14 DIAGNOSIS — Z0000 Encounter for general adult medical examination without abnormal findings: Secondary | ICD-10-CM | POA: Diagnosis not present

## 2017-04-16 DIAGNOSIS — F1129 Opioid dependence with unspecified opioid-induced disorder: Secondary | ICD-10-CM | POA: Diagnosis not present

## 2017-04-16 DIAGNOSIS — E611 Iron deficiency: Secondary | ICD-10-CM | POA: Diagnosis not present

## 2017-04-16 DIAGNOSIS — E785 Hyperlipidemia, unspecified: Secondary | ICD-10-CM | POA: Diagnosis not present

## 2017-04-16 DIAGNOSIS — J449 Chronic obstructive pulmonary disease, unspecified: Secondary | ICD-10-CM | POA: Diagnosis not present

## 2017-04-16 DIAGNOSIS — E559 Vitamin D deficiency, unspecified: Secondary | ICD-10-CM | POA: Diagnosis not present

## 2017-04-16 DIAGNOSIS — Z720 Tobacco use: Secondary | ICD-10-CM | POA: Diagnosis not present

## 2017-04-16 DIAGNOSIS — K861 Other chronic pancreatitis: Secondary | ICD-10-CM | POA: Diagnosis not present

## 2017-04-16 DIAGNOSIS — R7303 Prediabetes: Secondary | ICD-10-CM | POA: Diagnosis not present

## 2017-04-17 ENCOUNTER — Encounter (HOSPITAL_BASED_OUTPATIENT_CLINIC_OR_DEPARTMENT_OTHER): Payer: Self-pay

## 2017-04-17 ENCOUNTER — Emergency Department (HOSPITAL_BASED_OUTPATIENT_CLINIC_OR_DEPARTMENT_OTHER)
Admission: EM | Admit: 2017-04-17 | Discharge: 2017-04-17 | Disposition: A | Discharge status: Home / Self Care | Payer: Medicare HMO | Attending: Emergency Medicine | Admitting: Emergency Medicine

## 2017-04-17 DIAGNOSIS — Z79899 Other long term (current) drug therapy: Secondary | ICD-10-CM | POA: Diagnosis not present

## 2017-04-17 DIAGNOSIS — E871 Hypo-osmolality and hyponatremia: Secondary | ICD-10-CM | POA: Diagnosis not present

## 2017-04-17 DIAGNOSIS — L0291 Cutaneous abscess, unspecified: Secondary | ICD-10-CM

## 2017-04-17 DIAGNOSIS — L02414 Cutaneous abscess of left upper limb: Secondary | ICD-10-CM | POA: Insufficient documentation

## 2017-04-17 DIAGNOSIS — F17210 Nicotine dependence, cigarettes, uncomplicated: Secondary | ICD-10-CM | POA: Diagnosis not present

## 2017-04-17 LAB — BASIC METABOLIC PANEL
Anion gap: 7 (ref 5–15)
BUN: 8 mg/dL (ref 6–20)
CALCIUM: 9 mg/dL (ref 8.9–10.3)
CHLORIDE: 95 mmol/L — AB (ref 101–111)
CO2: 26 mmol/L (ref 22–32)
CREATININE: 0.56 mg/dL — AB (ref 0.61–1.24)
GFR calc non Af Amer: >60 mL/min (ref >60)
GLUCOSE: 118 mg/dL — AB (ref 65–99)
Potassium: 4.5 mmol/L (ref 3.5–5.1)
Sodium: 128 mmol/L — ABNORMAL LOW (ref 135–145)

## 2017-04-17 LAB — CBC WITH DIFFERENTIAL/PLATELET
Basophils Absolute: 0 10*3/uL (ref 0.0–0.1)
Basophils Relative: 0 %
EOS ABS: 0.1 10*3/uL (ref 0.0–0.7)
Eosinophils Relative: 1 %
HCT: 40.8 % (ref 39.0–52.0)
HEMOGLOBIN: 14 g/dL (ref 13.0–17.0)
LYMPHS ABS: 1.8 10*3/uL (ref 0.7–4.0)
Lymphocytes Relative: 18 %
MCH: 26.8 pg (ref 26.0–34.0)
MCHC: 34.3 g/dL (ref 30.0–36.0)
MCV: 78.2 fL (ref 78.0–100.0)
MONO ABS: 1.5 10*3/uL — AB (ref 0.1–1.0)
MONOS PCT: 15 %
Neutro Abs: 6.7 10*3/uL (ref 1.7–7.7)
Neutrophils Relative %: 66 %
Platelets: 302 10*3/uL (ref 150–400)
RBC: 5.22 MIL/uL (ref 4.22–5.81)
RDW: 16.3 % — AB (ref 11.5–15.5)
WBC: 10 10*3/uL (ref 4.0–10.5)

## 2017-04-17 MED ORDER — SULFAMETHOXAZOLE-TRIMETHOPRIM 800-160 MG PO TABS
1.0000 | ORAL_TABLET | Freq: Once | ORAL | Status: AC
  Administered 2017-04-17: 1 via ORAL
  Filled 2017-04-17: qty 1

## 2017-04-17 MED ORDER — CEPHALEXIN 250 MG PO CAPS
500.0000 mg | ORAL_CAPSULE | Freq: Once | ORAL | Status: AC
  Administered 2017-04-17: 500 mg via ORAL
  Filled 2017-04-17: qty 2

## 2017-04-17 MED ORDER — LIDOCAINE HCL 2 % IJ SOLN
10.0000 mL | Freq: Once | INTRAMUSCULAR | Status: AC
Start: 2017-04-17 — End: 2017-04-17
  Administered 2017-04-17: 200 mg
  Filled 2017-04-17: qty 20

## 2017-04-17 MED ORDER — HYDROMORPHONE HCL 1 MG/ML IJ SOLN
1.0000 mg | Freq: Once | INTRAMUSCULAR | Status: AC
  Administered 2017-04-17: 1 mg via INTRAVENOUS
  Filled 2017-04-17: qty 1

## 2017-04-17 MED ORDER — CEPHALEXIN 500 MG PO CAPS: ORAL_CAPSULE | ORAL | 0 refills | Status: DC

## 2017-04-17 MED ORDER — SULFAMETHOXAZOLE-TRIMETHOPRIM 800-160 MG PO TABS: 1.0000 | ORAL_TABLET | Freq: Two times a day (BID) | ORAL | 0 refills | Status: DC

## 2017-04-17 MED ORDER — METOCLOPRAMIDE HCL 5 MG/ML IJ SOLN
5.0000 mg | Freq: Once | INTRAMUSCULAR | Status: AC
  Administered 2017-04-17: 5 mg via INTRAVENOUS
  Filled 2017-04-17: qty 2

## 2017-04-17 MED ORDER — SODIUM CHLORIDE 0.9 % IV BOLUS (SEPSIS)
1000.0000 mL | Freq: Once | INTRAVENOUS | Status: AC
  Administered 2017-04-17: 1000 mL via INTRAVENOUS

## 2017-04-17 MED FILL — SULFAMETHOXAZOLE/TMP DS TAB: 800-160 | 7 days supply | Qty: 14 | Fill #0

## 2017-04-17 MED FILL — CEPHALEXIN 500 MG CAPSULE: 500 | 7 days supply | Qty: 21 | Fill #0

## 2017-04-17 NOTE — ED Triage Notes (Addendum)
Pt reports left deltoid abscess x1 month with progressive worsening, states abscess began after a flu vaccine injection - area red, drainage began last night, reports associated nausea. States seen by PCP yesterday in [facility], they were supposed to order an MRI, but did not. Pt states he is on Suboxone. States he had an ultrasound of area. PT drinks Boost and Pedialyte for management of chronic pancreatitis.

## 2017-04-17 NOTE — ED Provider Notes (Signed)
[place] [facility] DEPT [facility] Provider Note   CSN: [id number] Arrival date & time: 04/17/17  3382     History   Chief Complaint Chief Complaint  Patient presents with  . Abscess    HPI Jerry Knight is a 56 y.o. male.  HPI Complains of swollen painful left upper arm onset 1 month ago after he received a flu shot. He reports that area spontaneously began to drain thick yellow pus last night. He is been seen by his pain management specialist for the same complaint, reports that he had an ultrasound to check for "circulation." And an MRI has of his upper arm was presumably to be ordered however he recently checked and he has not been ordered yet. Pain is worse with pressing on the area. He vomited one time yesterday. No nausea presently. No known fever. No other associated symptoms. Past Medical History:  Diagnosis Date  . Chest pain   . Chronic neck pain   . Chronic pain   . Chronic pancreatitis ([employer])   . Degenerative disk disease    C 5 &C 6  . Depression   . GI bleed   . Migraine   . Migraine headache   . Pain management contract agreement   . Seizure disorder ([place])    x 7 years  . Seizures ([place])   . Zollinger-Ellison syndrome     Patient Active Problem List   Diagnosis Date Noted  . Loss of weight 01/22/2013  . Hypokalemia 01/22/2013  . Substance abuse 01/22/2013  . Pancreatitis, chronic ([place]) 08/27/2012  . Abdominal pain, epigastric 08/27/2012  . Chest pain 01/26/2012  . SOB (shortness of breath) 01/26/2012    Past Surgical History:  Procedure Laterality Date  . APPENDECTOMY     per CT 2012- s/p appendectomy  . CHOLECYSTECTOMY    . EYE SURGERY    . HERNIA REPAIR    . PARTIAL GASTRECTOMY    . SMALL INTESTINE SURGERY         Home Medications    Prior to Admission medications   Medication Sig Start Date End Date Taking? Authorizing Provider  Buprenorphine HCl-Naloxone HCl (SUBOXONE SL) Place under the tongue.   Yes [provider]  traZODone  (DESYREL) 50 MG tablet Take 50 mg by mouth at bedtime.   Yes [provider]  dicyclomine (BENTYL) 20 MG tablet Take 1 tablet (20 mg total) by mouth every 6 (six) hours as needed for spasms (for abdominal cramping). Patient not taking: Reported on 03/15/2016 11/11/14   Linton Flemings, MD  HYDROcodone-acetaminophen (NORCO/VICODIN) 5-325 MG per tablet Take 1 tablet by mouth every 4 (four) hours as needed. Patient not taking: Reported on 03/15/2016 07/30/15   Tanna Furry, MD  neomycin-polymyxin-hydrocortisone (CORTISPORIN) 3.5-10000-1 otic suspension Place 3 drops into the right ear 3 (three) times daily. Patient not taking: Reported on 03/15/2016 07/30/15   Tanna Furry, MD  omeprazole (PRILOSEC) 20 MG capsule Take 20 mg by mouth 2 (two) times daily.    [provider]  ondansetron (ZOFRAN) 4 MG tablet Take 1 tablet (4 mg total) by mouth every 6 (six) hours. 11/11/14   Linton Flemings, MD  oxyCODONE-acetaminophen (PERCOCET/ROXICET) 5-325 MG per tablet Take 1-2 tablets by mouth every 4 (four) hours as needed for severe pain. 10/11/14   Charlann Lange, PA-C    Family History Family History  Problem Relation Age of Onset  . Angina Mother   . Nephrolithiasis Father   . Cancer Brother     Social History  Social History  Substance Use Topics  . Smoking status: Current Every Day Smoker    Packs/day: 2.00    Types: Cigarettes    Last attempt to quit: 07/29/2000  . Smokeless tobacco: Never Used     Comment: about 2 packs a day  . Alcohol use No     Allergies   Iohexol and Sonata [zaleplon]   Review of Systems Review of Systems  Gastrointestinal: Positive for vomiting.  Skin: Positive for wound.  All other systems reviewed and are negative.    Physical Exam Updated Vital Signs BP 106/78 (BP Location: Right Arm)   Pulse 96   Temp 98.7 F (37.1 C) (Oral)   Resp 20   Ht 5\' 5"  (1.651 m)   Wt 96 lb (43.5 kg)   SpO2 97%   BMI 15.98 kg/m   Physical Exam  Constitutional:    Clinical ill-appearing. Cachectic  HENT:  Head: Normocephalic and atraumatic.  Eyes: Conjunctivae are normal. Pupils are equal, round, and reactive to light.  Neck: Neck supple. No tracheal deviation present. No thyromegaly present.  Cardiovascular: Normal rate and regular rhythm.   No murmur heard. Pulmonary/Chest: Effort normal and breath sounds normal.  Abdominal: Soft. Bowel sounds are normal. He exhibits no distension. There is no tenderness.  Musculoskeletal: Normal range of motion. He exhibits no edema or tenderness.  Right upper extremity with baseball sized fluctuant area overlying the deltoid spontaneously draining thick yellowish pus. Tender. Radial pulse 2+. Full range of motion no axillary nodes. All other extremities without redness swelling or tenderness neurovascularly intact  Neurological: He is alert. Coordination normal.  Skin: Skin is warm and dry. No rash noted.  Psychiatric: He has a normal mood and affect.  Nursing note and vitals reviewed.    ED Treatments / Results  Labs (all labs ordered are listed, but only abnormal results are displayed) Labs Reviewed  BASIC METABOLIC PANEL  CBC WITH DIFFERENTIAL/PLATELET    EKG  EKG Interpretation None       Radiology No results found.  Procedures .Marland KitchenIncision and Drainage Date/Time: 04/17/2017 11:26 AM Performed by: Orlie Dakin Authorized by: Orlie Dakin   Consent:    Consent obtained:  Verbal   Consent given by:  Patient   Risks discussed:  Bleeding, incomplete drainage, pain and infection   Alternatives discussed:  Alternative treatment and no treatment Location:    Type:  Abscess   Location:  Upper extremity   Upper extremity location:  Shoulder   Shoulder location:  L shoulder Pre-procedure details:    Skin preparation:  Betadine Anesthesia (see MAR for exact dosages):    Anesthesia method:  Local infiltration   Local anesthetic:  Lidocaine 2% w/o epi Procedure type:    Complexity:   Complex Procedure details:    Incision types:  Single straight   Incision depth:  Subcutaneous   Scalpel blade:  11   Wound management:  Probed and deloculated, irrigated with saline and extensive cleaning   Drainage:  Purulent and bloody   Drainage amount:  Copious   Wound treatment:  Wound left open Post-procedure details:    Patient tolerance of procedure:  Tolerated well, no immediate complications   (including critical care time)  Medications Ordered in ED Medications  lidocaine (XYLOCAINE) 2 % (with pres) injection 200 mg (not administered)      Results for orders placed or performed during the hospital encounter of 37/62/83  Basic metabolic panel  Result Value Ref Range   Sodium 128 (L)  135 - 145 mmol/L   Potassium 4.5 3.5 - 5.1 mmol/L   Chloride 95 (L) 101 - 111 mmol/L   CO2 26 22 - 32 mmol/L   Glucose, Bld 118 (H) 65 - 99 mg/dL   BUN 8 6 - 20 mg/dL   Creatinine, Ser 0.56 (L) 0.61 - 1.24 mg/dL   Calcium 9.0 8.9 - 10.3 mg/dL   GFR calc non Af Amer >60 >60 mL/min   GFR calc Af Amer >60 >60 mL/min   Anion gap 7 5 - 15  CBC with Differential/Platelet  Result Value Ref Range   WBC 10.0 4.0 - 10.5 K/uL   RBC 5.22 4.22 - 5.81 MIL/uL   Hemoglobin 14.0 13.0 - 17.0 g/dL   HCT 40.8 39.0 - 52.0 %   MCV 78.2 78.0 - 100.0 fL   MCH 26.8 26.0 - 34.0 pg   MCHC 34.3 30.0 - 36.0 g/dL   RDW 16.3 (H) 11.5 - 15.5 %   Platelets 302 150 - 400 K/uL   Neutrophils Relative % 66 %   Neutro Abs 6.7 1.7 - 7.7 K/uL   Lymphocytes Relative 18 %   Lymphs Abs 1.8 0.7 - 4.0 K/uL   Monocytes Relative 15 %   Monocytes Absolute 1.5 (H) 0.1 - 1.0 K/uL   Eosinophils Relative 1 %   Eosinophils Absolute 0.1 0.0 - 0.7 K/uL   Basophils Relative 0 %   Basophils Absolute 0.0 0.0 - 0.1 K/uL   No results found. Initial Impression / Assessment and Plan / ED Course  I have reviewed the triage vital signs and the nursing notes.  Pertinent labs & imaging results that were available during my care of  the patient were reviewed by me and considered in my medical decision making (see chart for details).     12:20 PM Resting comfortably alert Glasgow Coma Score of 15 after treatment with her venous saline, IV hydromorphone, oral Keflex and Bactrim DS. Plan prescriptions Keflex, Bactrim DS. Wound recheck 2 days. Advised patient is mildly hyponatremic and should have serum sodium rechecked within the next week.  Final Clinical Impressions(s) / ED Diagnoses  Diagnosis #1 complex abscess of left upper extremity #2 hyponatremia Final diagnoses:  None    New Prescriptions New Prescriptions   No medications on file     Orlie Dakin, MD 04/17/17 1223

## 2017-04-17 NOTE — Discharge Instructions (Signed)
No need to change the bandage until you are rechecked by a doctor in 2 days. Keep the bandage clean and dry. If you should get the bandage wet or dirty, you can change it by placing a sterile nonstick bandage over the wound. Take Advil as needed for pain in addition to your Suboxone if needed. Get your wound rechecked at an urgent care center in 48 hours. Return here for vomiting fever or if you feel worse for any reason. Your blood sodium was low today at 128. It should be rechecked within a week. Ask the doctors who are rechecking your wound to recheck your blood sodium

## 2017-04-19 ENCOUNTER — Emergency Department (HOSPITAL_BASED_OUTPATIENT_CLINIC_OR_DEPARTMENT_OTHER)
Admission: EM | Admit: 2017-04-19 | Discharge: 2017-04-19 | Disposition: A | Discharge status: Home / Self Care | Payer: Medicare HMO | Attending: Emergency Medicine | Admitting: Emergency Medicine

## 2017-04-19 ENCOUNTER — Encounter (HOSPITAL_BASED_OUTPATIENT_CLINIC_OR_DEPARTMENT_OTHER): Payer: Self-pay | Admitting: Emergency Medicine

## 2017-04-19 DIAGNOSIS — F17210 Nicotine dependence, cigarettes, uncomplicated: Secondary | ICD-10-CM | POA: Insufficient documentation

## 2017-04-19 DIAGNOSIS — Z4801 Encounter for change or removal of surgical wound dressing: Secondary | ICD-10-CM | POA: Diagnosis not present

## 2017-04-19 DIAGNOSIS — Z48817 Encounter for surgical aftercare following surgery on the skin and subcutaneous tissue: Secondary | ICD-10-CM | POA: Diagnosis not present

## 2017-04-19 DIAGNOSIS — Z09 Encounter for follow-up examination after completed treatment for conditions other than malignant neoplasm: Secondary | ICD-10-CM

## 2017-04-19 NOTE — Discharge Instructions (Signed)
Your sodium level was low on Tuesday (128).  Please have your doctor order a repeat sodium level to have it re-checked.    Please keep your appointment with your primary care doctor on Monday and have them re-evaluate your wound.

## 2017-04-19 NOTE — ED Provider Notes (Signed)
[facility] DEPT [facility] Provider Note   CSN: [id number] Arrival date & time: 04/19/17  1121     History   Chief Complaint Chief Complaint  Patient presents with  . Wound Check    HPI Jerry Knight is a 56 y.o. male who presents today for a wound check.  He had an abscess drained on his left upper arm.  He reports that he is still getting scant amounts of green/red drainage from his wound. Reports that he is not having fevers, says that he feels "much better," is not having nausea or vomiting.  His pain is much improved.  He reports that he has been taking all of his antibiotics as indicated.    He has a scheduled appointment with his primary care doctor on Monday, and states that he will have them recheck his wound and check his sodium levels.  HPI  Past Medical History:  Diagnosis Date  . Chest pain   . Chronic neck pain   . Chronic pain   . Chronic pancreatitis ([place])   . Degenerative disk disease    C 5 &C 6  . Depression   . GI bleed   . Migraine   . Migraine headache   . Pain management contract agreement   . Seizure disorder ([place])    x 7 years  . Seizures ([place])   . Zollinger-Ellison syndrome     Patient Active Problem List   Diagnosis Date Noted  . Loss of weight 01/22/2013  . Hypokalemia 01/22/2013  . Substance abuse 01/22/2013  . Pancreatitis, chronic ([place]) 08/27/2012  . Abdominal pain, epigastric 08/27/2012  . Chest pain 01/26/2012  . SOB (shortness of breath) 01/26/2012    Past Surgical History:  Procedure Laterality Date  . APPENDECTOMY     per CT 2012- s/p appendectomy  . CHOLECYSTECTOMY    . EYE SURGERY    . HERNIA REPAIR    . PARTIAL GASTRECTOMY    . SMALL INTESTINE SURGERY         Home Medications    Prior to Admission medications   Medication Sig Start Date End Date Taking? Authorizing Provider  Buprenorphine HCl-Naloxone HCl (SUBOXONE SL) Place under the tongue.    [provider]  cephALEXin (KEFLEX) 500 MG capsule 1  capsule 3 times a day 7 days 04/17/17   Orlie Dakin, MD  dicyclomine (BENTYL) 20 MG tablet Take 1 tablet (20 mg total) by mouth every 6 (six) hours as needed for spasms (for abdominal cramping). Patient not taking: Reported on 03/15/2016 11/11/14   Linton Flemings, MD  HYDROcodone-acetaminophen (NORCO/VICODIN) 5-325 MG per tablet Take 1 tablet by mouth every 4 (four) hours as needed. Patient not taking: Reported on 03/15/2016 07/30/15   Tanna Furry, MD  neomycin-polymyxin-hydrocortisone (CORTISPORIN) 3.5-10000-1 otic suspension Place 3 drops into the right ear 3 (three) times daily. Patient not taking: Reported on 03/15/2016 07/30/15   Tanna Furry, MD  omeprazole (PRILOSEC) 20 MG capsule Take 20 mg by mouth 2 (two) times daily.    [provider]  ondansetron (ZOFRAN) 4 MG tablet Take 1 tablet (4 mg total) by mouth every 6 (six) hours. 11/11/14   Linton Flemings, MD  oxyCODONE-acetaminophen (PERCOCET/ROXICET) 5-325 MG per tablet Take 1-2 tablets by mouth every 4 (four) hours as needed for severe pain. 10/11/14   Charlann Lange, PA-C  sulfamethoxazole-trimethoprim (BACTRIM DS,SEPTRA DS) 800-160 MG tablet Take 1 tablet by mouth 2 (two) times daily. 04/17/17   Orlie Dakin, MD  traZODone (DESYREL) 50  MG tablet Take 50 mg by mouth at bedtime.    [provider]    Family History Family History  Problem Relation Age of Onset  . Angina Mother   . Nephrolithiasis Father   . Cancer Brother     Social History Social History  Substance Use Topics  . Smoking status: Current Every Day Smoker    Packs/day: 2.00    Types: Cigarettes    Last attempt to quit: 07/29/2000  . Smokeless tobacco: Never Used     Comment: about 2 packs a day  . Alcohol use No     Allergies   Iohexol and Sonata [zaleplon]   Review of Systems Review of Systems  Constitutional: Negative for appetite change, chills, diaphoresis, fatigue and fever.  Gastrointestinal: Negative for diarrhea, nausea and  vomiting.  Skin: Positive for wound. Negative for color change and rash.     Physical Exam Updated Vital Signs BP 101/79 (BP Location: Right Arm)   Pulse 96   Temp 98.6 F (37 C) (Oral)   Resp 20   Ht 5' 5.5" (1.664 m)   Wt 100 lb (45.4 kg)   SpO2 99%   BMI 16.39 kg/m   Physical Exam  Constitutional: He appears well-developed and well-nourished.  Cardiovascular: Normal rate.   Pulmonary/Chest: Effort normal. No respiratory distress.  Musculoskeletal: He exhibits no deformity.  Neurological: He is alert.  Skin: Skin is warm and dry. He is not diaphoretic.  Incision over left deltoid appears to be healing well with good beefy red granulation tissue in the bed of the incision.  Area of erythema appears greatly decreased from initial I&D. Minimal induration noted without area of fluctuance. Scant serosanguineous drainage from wound noted.    Nursing note and vitals reviewed.    ED Treatments / Results  Labs (all labs ordered are listed, but only abnormal results are displayed) Labs Reviewed - No data to display  EKG  EKG Interpretation None       Radiology No results found.  Procedures Procedures (including critical care time)  Medications Ordered in ED Medications - No data to display   Initial Impression / Assessment and Plan / ED Course  I have reviewed the triage vital signs and the nursing notes.  Pertinent labs & imaging results that were available during my care of the patient were reviewed by me and considered in my medical decision making (see chart for details).    Pt with abscess seen four days ago with I&D. Chart reviewed in EPIC. Pt has been doing a very good job of taking care of the site at home and has been taking medications as directed. The site is healthy appearing, with only a small amount of purulent drainage around the skin but incision is open and without significant drainage. No surrounding cellulitis and no palpable fluctuance or  induration indicating deeper infection or residual abscess. Pt is also without any pain or tenderness in the area. Discussed home care and return precautions, pt advised to continue all antibiotics until they are gone. Patient has appointment with PCP on Monday for repeat wound check and evaluation of hyponatremia noted on Tuesday.   Dr. Leonette Monarch saw the patient, evaluated him and agreed with my plan.     Final Clinical Impressions(s) / ED Diagnoses   Final diagnoses:  Encounter for recheck of abscess following incision and drainage    New Prescriptions New Prescriptions   No medications on file     Lorin Glass, [place]  04/19/17 1249    Fatima Blank, MD 04/19/17 1955

## 2017-04-19 NOTE — ED Triage Notes (Signed)
Pt had abscess drained Tuesday on L arm, here for recheck.

## 2017-04-21 DIAGNOSIS — E559 Vitamin D deficiency, unspecified: Secondary | ICD-10-CM | POA: Diagnosis not present

## 2017-04-21 DIAGNOSIS — R7303 Prediabetes: Secondary | ICD-10-CM | POA: Diagnosis not present

## 2017-04-21 DIAGNOSIS — E611 Iron deficiency: Secondary | ICD-10-CM | POA: Diagnosis not present

## 2017-04-21 DIAGNOSIS — E785 Hyperlipidemia, unspecified: Secondary | ICD-10-CM | POA: Diagnosis not present

## 2017-04-21 DIAGNOSIS — E8809 Other disorders of plasma-protein metabolism, not elsewhere classified: Secondary | ICD-10-CM | POA: Diagnosis not present

## 2017-04-21 DIAGNOSIS — F1129 Opioid dependence with unspecified opioid-induced disorder: Secondary | ICD-10-CM | POA: Diagnosis not present

## 2017-04-21 DIAGNOSIS — J449 Chronic obstructive pulmonary disease, unspecified: Secondary | ICD-10-CM | POA: Diagnosis not present

## 2017-04-21 DIAGNOSIS — K861 Other chronic pancreatitis: Secondary | ICD-10-CM | POA: Diagnosis not present

## 2017-04-23 DIAGNOSIS — F1129 Opioid dependence with unspecified opioid-induced disorder: Secondary | ICD-10-CM | POA: Diagnosis not present

## 2017-05-01 ENCOUNTER — Emergency Department (HOSPITAL_BASED_OUTPATIENT_CLINIC_OR_DEPARTMENT_OTHER)
Admission: EM | Admit: 2017-05-01 | Discharge: 2017-05-01 | Disposition: A | Discharge status: Home / Self Care | Payer: Medicare HMO | Attending: Emergency Medicine | Admitting: Emergency Medicine

## 2017-05-01 ENCOUNTER — Encounter (HOSPITAL_BASED_OUTPATIENT_CLINIC_OR_DEPARTMENT_OTHER): Payer: Self-pay | Admitting: *Deleted

## 2017-05-01 ENCOUNTER — Emergency Department (HOSPITAL_BASED_OUTPATIENT_CLINIC_OR_DEPARTMENT_OTHER): Payer: Medicare HMO

## 2017-05-01 DIAGNOSIS — K5900 Constipation, unspecified: Secondary | ICD-10-CM | POA: Diagnosis not present

## 2017-05-01 DIAGNOSIS — R1110 Vomiting, unspecified: Secondary | ICD-10-CM | POA: Diagnosis present

## 2017-05-01 DIAGNOSIS — R109 Unspecified abdominal pain: Secondary | ICD-10-CM | POA: Diagnosis not present

## 2017-05-01 DIAGNOSIS — R112 Nausea with vomiting, unspecified: Secondary | ICD-10-CM | POA: Diagnosis not present

## 2017-05-01 DIAGNOSIS — R1010 Upper abdominal pain, unspecified: Secondary | ICD-10-CM | POA: Insufficient documentation

## 2017-05-01 DIAGNOSIS — F17210 Nicotine dependence, cigarettes, uncomplicated: Secondary | ICD-10-CM | POA: Diagnosis not present

## 2017-05-01 DIAGNOSIS — R1084 Generalized abdominal pain: Secondary | ICD-10-CM | POA: Insufficient documentation

## 2017-05-01 DIAGNOSIS — Z79899 Other long term (current) drug therapy: Secondary | ICD-10-CM | POA: Diagnosis not present

## 2017-05-01 LAB — COMPREHENSIVE METABOLIC PANEL
ALBUMIN: 3.5 g/dL (ref 3.5–5.0)
ALK PHOS: 108 U/L (ref 38–126)
ALT: 15 U/L — AB (ref 17–63)
AST: 27 U/L (ref 15–41)
Anion gap: 10 (ref 5–15)
BUN: 8 mg/dL (ref 6–20)
CALCIUM: 8.9 mg/dL (ref 8.9–10.3)
CO2: 23 mmol/L (ref 22–32)
CREATININE: 0.65 mg/dL (ref 0.61–1.24)
Chloride: 97 mmol/L — ABNORMAL LOW (ref 101–111)
GFR calc non Af Amer: >60 mL/min (ref >60)
GLUCOSE: 108 mg/dL — AB (ref 65–99)
Potassium: 3.6 mmol/L (ref 3.5–5.1)
SODIUM: 130 mmol/L — AB (ref 135–145)
Total Bilirubin: 0.8 mg/dL (ref 0.3–1.2)
Total Protein: 7.5 g/dL (ref 6.5–8.1)

## 2017-05-01 LAB — OCCULT BLOOD X 1 CARD TO LAB, STOOL: FECAL OCCULT BLD: NEGATIVE

## 2017-05-01 LAB — CBC
HCT: 36.5 % — ABNORMAL LOW (ref 39.0–52.0)
Hemoglobin: 12 g/dL — ABNORMAL LOW (ref 13.0–17.0)
MCH: 26.8 pg (ref 26.0–34.0)
MCHC: 32.9 g/dL (ref 30.0–36.0)
MCV: 81.5 fL (ref 78.0–100.0)
PLATELETS: 316 10*3/uL (ref 150–400)
RBC: 4.48 MIL/uL (ref 4.22–5.81)
RDW: 16.9 % — ABNORMAL HIGH (ref 11.5–15.5)
WBC: 12.8 10*3/uL — ABNORMAL HIGH (ref 4.0–10.5)

## 2017-05-01 LAB — LIPASE, BLOOD: Lipase: 24 U/L (ref 11–51)

## 2017-05-01 MED ORDER — ONDANSETRON HCL 4 MG/2ML IJ SOLN
4.0000 mg | Freq: Once | INTRAMUSCULAR | Status: AC
  Administered 2017-05-01: 4 mg via INTRAVENOUS
  Filled 2017-05-01: qty 2

## 2017-05-01 MED ORDER — DICYCLOMINE HCL 10 MG/ML IM SOLN
20.0000 mg | Freq: Once | INTRAMUSCULAR | Status: AC
  Administered 2017-05-01: 20 mg via INTRAMUSCULAR
  Filled 2017-05-01: qty 2

## 2017-05-01 MED ORDER — BISACODYL 10 MG RE SUPP
10.0000 mg | RECTAL | Status: AC
  Administered 2017-05-01: 10 mg via RECTAL
  Filled 2017-05-01: qty 1

## 2017-05-01 MED ORDER — SODIUM CHLORIDE 0.9 % IV BOLUS (SEPSIS)
1000.0000 mL | Freq: Once | INTRAVENOUS | Status: AC
  Administered 2017-05-01: 1000 mL via INTRAVENOUS

## 2017-05-01 MED ORDER — DICYCLOMINE HCL 20 MG PO TABS: 20.0000 mg | ORAL_TABLET | Freq: Two times a day (BID) | ORAL | 0 refills | Status: DC

## 2017-05-01 MED ORDER — LACTULOSE 10 GM/15ML PO SOLN
20.0000 g | Freq: Once | ORAL | Status: AC
  Administered 2017-05-01: 20 g via ORAL
  Filled 2017-05-01: qty 30

## 2017-05-01 MED ORDER — BISACODYL 5 MG PO TBEC: 5.0000 mg | DELAYED_RELEASE_TABLET | Freq: Two times a day (BID) | ORAL | 0 refills | Status: DC

## 2017-05-01 MED ORDER — ONDANSETRON HCL 4 MG PO TABS: 4.0000 mg | ORAL_TABLET | Freq: Three times a day (TID) | ORAL | 0 refills | Status: DC | PRN

## 2017-05-01 MED ORDER — HYDROMORPHONE HCL 1 MG/ML IJ SOLN
1.0000 mg | Freq: Once | INTRAMUSCULAR | Status: AC
  Administered 2017-05-01: 1 mg via INTRAVENOUS
  Filled 2017-05-01: qty 1

## 2017-05-01 MED ORDER — HYDROMORPHONE HCL 1 MG/ML IJ SOLN
1.0000 mg | Freq: Once | INTRAMUSCULAR | Status: AC
  Filled 2017-05-01: qty 1

## 2017-05-01 MED FILL — DICYCLOMINE 20 MG TABLET: 20 | 10 days supply | Qty: 20 | Fill #0

## 2017-05-01 MED FILL — ONDANSETRON HCL 4 MG TABLET: 4 | 4 days supply | Qty: 12 | Fill #0

## 2017-05-01 MED FILL — SM GENTLE LAXATIVE EC 5 MG: 5 MG | 50 days supply | Qty: 100 | Fill #0

## 2017-05-01 NOTE — ED Notes (Signed)
Instructed to use call bell for assistance if needs to ambulate to BR, verbalized understanding, call bell within reach.

## 2017-05-01 NOTE — ED Notes (Signed)
ED provider informed of BP and dilaudid held at present.

## 2017-05-01 NOTE — ED Triage Notes (Signed)
Pt reports n/v x5days. States he thinks it's a flare-up of chronic pancreatitis. Also reports no BM x1wk (reports hx of blockage). Denies fever.

## 2017-05-01 NOTE — ED Notes (Signed)
Patient transported to X-ray 

## 2017-05-01 NOTE — ED Notes (Signed)
Patient is requesting medications for pain. Patient is made aware that his BP is low and would need to verify medications with MD

## 2017-05-01 NOTE — Discharge Instructions (Signed)
Read the information below.  Use the prescribed medication as directed.  Please discuss all new medications with your pharmacist.  You may return to the Emergency Department at any time for worsening condition or any new symptoms that concern you.   If you develop high fevers, worsening abdominal pain, uncontrolled vomiting, or are unable to tolerate fluids by mouth, return to the ER for a recheck.  ° °

## 2017-05-01 NOTE — ED Provider Notes (Signed)
[facility] DEPT [facility] Provider Note   CSN: [id number] Arrival date & time: 05/01/17  1001     History   Chief Complaint Chief Complaint  Patient presents with  . Emesis  . Abdominal Pain    HPI Jerry Knight is a 56 y.o. male.  HPI   Pt with hx chronic pancreatitis, GI bleed p/w upper abdominal pain, N/V x 5 days and constipation x 1 week. Emesis is water and bile.  Has had BRBPR 5 times, but is not passing gas.  Has also had increase in his cough and SOB over baseline, needing his inhalers more.  No change in sputum productive, no CP or fevers.  Is taking oxycodone at home without relief.  No urinary symptoms.    Past Medical History:  Diagnosis Date  . Chest pain   . Chronic neck pain   . Chronic pain   . Chronic pancreatitis ([place])   . Degenerative disk disease    C 5 &C 6  . Depression   . GI bleed   . Migraine   . Migraine headache   . Pain management contract agreement   . Seizure disorder ([place])    x 7 years  . Seizures ([place])   . Zollinger-Ellison syndrome     Patient Active Problem List   Diagnosis Date Noted  . Loss of weight 01/22/2013  . Hypokalemia 01/22/2013  . Substance abuse 01/22/2013  . Pancreatitis, chronic ([place]) 08/27/2012  . Abdominal pain, epigastric 08/27/2012  . Chest pain 01/26/2012  . SOB (shortness of breath) 01/26/2012    Past Surgical History:  Procedure Laterality Date  . APPENDECTOMY     per CT 2012- s/p appendectomy  . CHOLECYSTECTOMY    . EYE SURGERY    . HERNIA REPAIR    . PARTIAL GASTRECTOMY    . SMALL INTESTINE SURGERY         Home Medications    Prior to Admission medications   Medication Sig Start Date End Date Taking? Authorizing Provider  omeprazole (PRILOSEC) 20 MG capsule Take 20 mg by mouth 2 (two) times daily.   Yes [provider]  oxyCODONE-acetaminophen (PERCOCET/ROXICET) 5-325 MG per tablet Take 1-2 tablets by mouth every 4 (four) hours as needed for severe pain. 10/11/14  Yes [place],  Nehemiah Settle, PA-C  traZODone (DESYREL) 50 MG tablet Take 50 mg by mouth at bedtime.   Yes [provider]  bisacodyl (DULCOLAX) 5 MG EC tablet Take 1 tablet (5 mg total) by mouth 2 (two) times daily. 05/01/17   Clayton Bibles, PA-C  Buprenorphine HCl-Naloxone HCl (SUBOXONE SL) Place under the tongue.    [provider]  dicyclomine (BENTYL) 20 MG tablet Take 1 tablet (20 mg total) by mouth 2 (two) times daily. 05/01/17   Clayton Bibles, PA-C  ondansetron (ZOFRAN) 4 MG tablet Take 1 tablet (4 mg total) by mouth every 8 (eight) hours as needed for nausea or vomiting. 05/01/17   Clayton Bibles, PA-C    Family History Family History  Problem Relation Age of Onset  . Angina Mother   . Nephrolithiasis Father   . Cancer Brother     Social History Social History  Substance Use Topics  . Smoking status: Current Every Day Smoker    Packs/day: 2.00    Types: Cigarettes    Last attempt to quit: 07/29/2000  . Smokeless tobacco: Never Used     Comment: about 2 packs a day  . Alcohol use No     Allergies  Iohexol and Sonata [zaleplon]   Review of Systems Review of Systems  All other systems reviewed and are negative.    Physical Exam Updated Vital Signs BP 113/80 (BP Location: Right Arm)   Pulse 71   Temp 98.7 F (37.1 C) (Oral)   Resp 20   Ht 5\' 5"  (1.651 m)   Wt 45.4 kg (100 lb)   SpO2 95%   BMI 16.64 kg/m   Physical Exam  Constitutional: He appears well-developed. No distress.  Thin   HENT:  Head: Normocephalic and atraumatic.  Mouth/Throat: Mucous membranes are dry.  Neck: Neck supple.  Cardiovascular: Normal rate and regular rhythm.   Pulmonary/Chest: Effort normal. No respiratory distress. He has no wheezes. He has rales (bibasilar ).  Abdominal: Soft. He exhibits no distension and no mass. There is tenderness in the right upper quadrant, epigastric area and suprapubic area. There is no rebound and no guarding.  Well healed remote surgical scars   Genitourinary:  Rectal exam shows no external hemorrhoid, no tenderness and anal tone normal.  Genitourinary Comments: Rectal exam performed by Arvil Chaco, PA-S, under my direct supervision.    Neurological: He is alert. He exhibits normal muscle tone.  Skin: He is not diaphoretic.  Nursing note and vitals reviewed.    ED Treatments / Results  Labs (all labs ordered are listed, but only abnormal results are displayed) Labs Reviewed  COMPREHENSIVE METABOLIC PANEL - Abnormal; Notable for the following:       Result Value   Sodium 130 (*)    Chloride 97 (*)    Glucose, Bld 108 (*)    ALT 15 (*)    All other components within normal limits  CBC - Abnormal; Notable for the following:    WBC 12.8 (*)    Hemoglobin 12.0 (*)    HCT 36.5 (*)    RDW 16.9 (*)    All other components within normal limits  LIPASE, BLOOD  OCCULT BLOOD X 1 CARD TO LAB, STOOL    EKG  EKG Interpretation None       Radiology Dg Abdomen Acute W/chest  Result Date: 05/01/2017 CLINICAL DATA:  Abdominal pain and vomiting.  Constipation. EXAM: DG ABDOMEN ACUTE W/ 1V CHEST COMPARISON:  Chest radiograph September 15, 2014; abdomen radiographs March 17, 2016 FINDINGS: PA chest: Degree of inspiration is more shallow than on prior study. Mild vessel crowding noted in the bases. No frank edema or consolidation. Heart size and pulmonary vascularity are normal. No adenopathy. Supine and upright abdomen: There is fairly diffuse stool throughout the colon. There is postoperative change in the left abdomen. There is no bowel dilatation or air-fluid level suggesting bowel obstruction. No free air. There are phleboliths in the pelvis. IMPRESSION: Fairly diffuse stool throughout colon. Areas postoperative change. No bowel obstruction or free air. No lung edema or consolidation. Electronically Signed   By: Lowella Grip III M.D.   On: 05/01/2017 11:18    Procedures Procedures (including critical care time)  Medications Ordered in  ED Medications  sodium chloride 0.9 % bolus 1,000 mL (0 mLs Intravenous Stopped 05/01/17 1135)  HYDROmorphone (DILAUDID) injection 1 mg (1 mg Intravenous Given 05/01/17 1117)  ondansetron (ZOFRAN) injection 4 mg (4 mg Intravenous Given 05/01/17 1116)  sodium chloride 0.9 % bolus 1,000 mL (0 mLs Intravenous Stopped 05/01/17 1305)  lactulose (CHRONULAC) 10 GM/15ML solution 20 g (20 g Oral Given 05/01/17 1206)  bisacodyl (DULCOLAX) suppository 10 mg (10 mg Rectal Given 05/01/17 1206)  HYDROmorphone (  DILAUDID) injection 1 mg (0 mg Intravenous Return to [facility] 05/01/17 1408)  ondansetron (ZOFRAN) injection 4 mg (4 mg Intravenous Given 05/01/17 1206)  dicyclomine (BENTYL) injection 20 mg (20 mg Intramuscular Given 05/01/17 1429)     Initial Impression / Assessment and Plan / ED Course  I have reviewed the triage vital signs and the nursing notes.  Pertinent labs & imaging results that were available during my care of the patient were reviewed by me and considered in my medical decision making (see chart for details).     Afebrile, nontoxic patient with chronic abdominal pain, chronic constipation, chronic pancreatitis, on chronic narcotics p/w abdominal pain, N/V, constipation.  No vomiting in ED.  Clinically doubt bowel obstruction.  Xray negative for obstruction.  Labs significant for mild dehydration.  IVF, pain and nausea medications given in ED.  Also treated for constipation with some success.  Tolerating PO.   D/C home with symptomatic medications, PCP follow up.  Discussed result, findings, treatment, and follow up  with patient.  Pt given return precautions.  Pt verbalizes understanding and agrees with plan.       Final Clinical Impressions(s) / ED Diagnoses   Final diagnoses:  Generalized abdominal pain  Constipation, unspecified constipation type  Nausea and vomiting, intractability of vomiting not specified, unspecified vomiting type    New Prescriptions Discharge Medication List as of  05/01/2017  2:37 PM    START taking these medications   Details  bisacodyl (DULCOLAX) 5 MG EC tablet Take 1 tablet (5 mg total) by mouth 2 (two) times daily., Starting Thu 05/01/2017, [address], [place], PA-C 05/01/17 1639    Veryl Speak, MD 05/02/17 [phone]

## 2017-07-14 ENCOUNTER — Emergency Department (HOSPITAL_BASED_OUTPATIENT_CLINIC_OR_DEPARTMENT_OTHER)
Admission: EM | Admit: 2017-07-14 | Discharge: 2017-07-14 | Disposition: A | Discharge status: Home / Self Care | Payer: Medicare HMO | Attending: Emergency Medicine | Admitting: Emergency Medicine

## 2017-07-14 ENCOUNTER — Emergency Department (HOSPITAL_BASED_OUTPATIENT_CLINIC_OR_DEPARTMENT_OTHER): Payer: Medicare HMO

## 2017-07-14 ENCOUNTER — Encounter (HOSPITAL_BASED_OUTPATIENT_CLINIC_OR_DEPARTMENT_OTHER): Payer: Self-pay

## 2017-07-14 DIAGNOSIS — R109 Unspecified abdominal pain: Secondary | ICD-10-CM | POA: Diagnosis not present

## 2017-07-14 DIAGNOSIS — Z79899 Other long term (current) drug therapy: Secondary | ICD-10-CM | POA: Diagnosis not present

## 2017-07-14 DIAGNOSIS — F17210 Nicotine dependence, cigarettes, uncomplicated: Secondary | ICD-10-CM | POA: Insufficient documentation

## 2017-07-14 DIAGNOSIS — R112 Nausea with vomiting, unspecified: Secondary | ICD-10-CM | POA: Diagnosis not present

## 2017-07-14 DIAGNOSIS — R1010 Upper abdominal pain, unspecified: Secondary | ICD-10-CM | POA: Diagnosis not present

## 2017-07-14 DIAGNOSIS — G8929 Other chronic pain: Secondary | ICD-10-CM | POA: Diagnosis not present

## 2017-07-14 DIAGNOSIS — K3189 Other diseases of stomach and duodenum: Secondary | ICD-10-CM | POA: Diagnosis not present

## 2017-07-14 DIAGNOSIS — R1013 Epigastric pain: Secondary | ICD-10-CM | POA: Diagnosis not present

## 2017-07-14 DIAGNOSIS — F17200 Nicotine dependence, unspecified, uncomplicated: Secondary | ICD-10-CM

## 2017-07-14 LAB — COMPREHENSIVE METABOLIC PANEL
ALBUMIN: 4.4 g/dL (ref 3.5–5.0)
ALK PHOS: 126 U/L (ref 38–126)
ALT: 16 U/L — AB (ref 17–63)
AST: 25 U/L (ref 15–41)
Anion gap: 14 (ref 5–15)
BILIRUBIN TOTAL: 0.9 mg/dL (ref 0.3–1.2)
BUN: 15 mg/dL (ref 6–20)
CALCIUM: 9.9 mg/dL (ref 8.9–10.3)
CO2: 23 mmol/L (ref 22–32)
Chloride: 98 mmol/L — ABNORMAL LOW (ref 101–111)
Creatinine, Ser: 0.65 mg/dL (ref 0.61–1.24)
GFR calc Af Amer: >60 mL/min (ref >60)
GFR calc non Af Amer: >60 mL/min (ref >60)
GLUCOSE: 102 mg/dL — AB (ref 65–99)
Potassium: 4.1 mmol/L (ref 3.5–5.1)
Sodium: 135 mmol/L (ref 135–145)
TOTAL PROTEIN: 9 g/dL — AB (ref 6.5–8.1)

## 2017-07-14 LAB — URINALYSIS, ROUTINE W REFLEX MICROSCOPIC
BILIRUBIN URINE: NEGATIVE
Glucose, UA: NEGATIVE mg/dL
HGB URINE DIPSTICK: NEGATIVE
Ketones, ur: 15 mg/dL — AB
Leukocytes, UA: NEGATIVE
NITRITE: NEGATIVE
PH: 6.5 (ref 5.0–8.0)
Protein, ur: NEGATIVE mg/dL
Specific Gravity, Urine: 1.023 (ref 1.005–1.030)

## 2017-07-14 LAB — LIPASE, BLOOD: Lipase: 35 U/L (ref 11–51)

## 2017-07-14 LAB — CBC
HCT: 47.7 % (ref 39.0–52.0)
Hemoglobin: 16.5 g/dL (ref 13.0–17.0)
MCH: 28.4 pg (ref 26.0–34.0)
MCHC: 34.6 g/dL (ref 30.0–36.0)
MCV: 82.1 fL (ref 78.0–100.0)
Platelets: 268 10*3/uL (ref 150–400)
RBC: 5.81 MIL/uL (ref 4.22–5.81)
RDW: 16.7 % — AB (ref 11.5–15.5)
WBC: 11.7 10*3/uL — ABNORMAL HIGH (ref 4.0–10.5)

## 2017-07-14 MED ORDER — MORPHINE SULFATE (PF) 4 MG/ML IV SOLN
4.0000 mg | Freq: Once | INTRAVENOUS | Status: AC
  Administered 2017-07-14: 4 mg via INTRAVENOUS
  Filled 2017-07-14: qty 1

## 2017-07-14 MED ORDER — SODIUM CHLORIDE 0.9 % IV BOLUS (SEPSIS)
1000.0000 mL | Freq: Once | INTRAVENOUS | Status: AC
  Administered 2017-07-14: 1000 mL via INTRAVENOUS

## 2017-07-14 MED ORDER — ONDANSETRON HCL 4 MG/2ML IJ SOLN
4.0000 mg | Freq: Once | INTRAMUSCULAR | Status: AC | PRN
  Administered 2017-07-14: 4 mg via INTRAVENOUS
  Filled 2017-07-14: qty 2

## 2017-07-14 MED ORDER — FAMOTIDINE IN NACL 20-0.9 MG/50ML-% IV SOLN
20.0000 mg | Freq: Once | INTRAVENOUS | Status: AC
  Administered 2017-07-14: 20 mg via INTRAVENOUS
  Filled 2017-07-14: qty 50

## 2017-07-14 MED ORDER — SUCRALFATE 1 G PO TABS: 1.0000 g | ORAL_TABLET | Freq: Three times a day (TID) | ORAL | 0 refills | Status: DC

## 2017-07-14 MED ORDER — HYDROMORPHONE HCL 1 MG/ML IJ SOLN
1.0000 mg | Freq: Once | INTRAMUSCULAR | Status: AC
  Administered 2017-07-14: 1 mg via INTRAVENOUS
  Filled 2017-07-14: qty 1

## 2017-07-14 MED ORDER — OMEPRAZOLE 40 MG PO CPDR: 40.0000 mg | DELAYED_RELEASE_CAPSULE | Freq: Two times a day (BID) | ORAL | 0 refills | Status: DC

## 2017-07-14 NOTE — ED Notes (Signed)
Pain continues post contrast. States dilaudid normally works well. edp to be notified.

## 2017-07-14 NOTE — ED Notes (Signed)
Salting crackers given to Pt, ok Per RN Velna Hatchet

## 2017-07-14 NOTE — ED Triage Notes (Signed)
Pt c/o "flare up" of chronic pancreatitis. Pt symptoms include N/V/D, severe abd pain. Pt thinks he is dehydrated.

## 2017-07-14 NOTE — ED Provider Notes (Signed)
[place] DEPT [facility] Provider Note   CSN: [id number] Arrival date & time: 07/14/17  1531     History   Chief Complaint Chief Complaint  Patient presents with  . Abdominal Pain    HPI Jerry Knight is a 56 y.o. male.  Jerry Knight is a 56 y.o. Male with history of chronic pancreatitis, partial gastrectomy, cholecystectomy and appendectomy presents to the emergency department complaining of a flare of his chronic pancreatitis. Patient reports he's been having pain for the past 3 days. He reports pain to his upper abdomen. He reports this pain is the same as his chronic pancreatitis. Reports associated nausea and vomiting. He has been able to eat or drink very little over the past several days. He denies any hematemesis or hematochezia. He took some Zofran at home without relief of his symptoms. No other treatments attempted prior to arrival. 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 He reports partial gastrectomy previous to this. He denies fevers, hematemesis, hematochezia, urinary symptoms, chest pain, shortness of breath, or rashes.   The history is provided by the patient and medical records. No language interpreter was used.  Abdominal Pain   Associated symptoms include nausea and vomiting. Pertinent negatives include fever, diarrhea, dysuria, frequency and headaches.    Past Medical History:  Diagnosis Date  . Chest pain   . Chronic neck pain   . Chronic pain   . Chronic pancreatitis ([place])   . Degenerative disk disease    C 5 &C 6  . Depression   . GI bleed   . Migraine   . Migraine headache   . Pain management contract agreement   . Seizure disorder ([place])    x 7 years  . Seizures ([place])   . Zollinger-Ellison syndrome     Patient Active Problem List   Diagnosis Date Noted  . Loss of weight 01/22/2013  . Hypokalemia 01/22/2013  . Substance abuse 01/22/2013  . Pancreatitis, chronic ([place]) 08/27/2012  . Abdominal pain, epigastric 08/27/2012    . Chest pain 01/26/2012  . SOB (shortness of breath) 01/26/2012    Past Surgical History:  Procedure Laterality Date  . APPENDECTOMY     per CT 2012- s/p appendectomy  . CHOLECYSTECTOMY    . EYE SURGERY    . HERNIA REPAIR    . PARTIAL GASTRECTOMY    . SMALL INTESTINE SURGERY         Home Medications    Prior to Admission medications   Medication Sig Start Date End Date Taking? Authorizing Provider  bisacodyl (DULCOLAX) 5 MG EC tablet Take 1 tablet (5 mg total) by mouth 2 (two) times daily. 05/01/17   Clayton Bibles, PA-C  Buprenorphine HCl-Naloxone HCl (SUBOXONE SL) Place under the tongue.    [provider]  dicyclomine (BENTYL) 20 MG tablet Take 1 tablet (20 mg total) by mouth 2 (two) times daily. 05/01/17   Clayton Bibles, PA-C  omeprazole (PRILOSEC) 40 MG capsule Take 1 capsule (40 mg total) by mouth 2 (two) times daily before a meal. 07/14/17   Waynetta Pean, PA-C  ondansetron (ZOFRAN) 4 MG tablet Take 1 tablet (4 mg total) by mouth every 8 (eight) hours as needed for nausea or vomiting. 05/01/17   Clayton Bibles, PA-C  oxyCODONE-acetaminophen (PERCOCET/ROXICET) 5-325 MG per tablet Take 1-2 tablets by mouth every 4 (four) hours as needed for severe pain. 10/11/14   Charlann Lange, PA-C  sucralfate (CARAFATE) 1 g tablet Take 1 tablet (1  g total) by mouth 4 (four) times daily -  with meals and at bedtime. 07/14/17   Waynetta Pean, PA-C  traZODone (DESYREL) 50 MG tablet Take 50 mg by mouth at bedtime.    [provider]    Family History Family History  Problem Relation Age of Onset  . Angina Mother   . Nephrolithiasis Father   . Cancer Brother     Social History Social History  Substance Use Topics  . Smoking status: Current Every Day Smoker    Packs/day: 2.00    Types: Cigarettes    Last attempt to quit: 07/29/2000  . Smokeless tobacco: Never Used     Comment: about 2 packs a day  . Alcohol use No     Allergies   Iohexol and Sonata  [zaleplon]   Review of Systems Review of Systems  Constitutional: Negative for chills and fever.  HENT: Negative for congestion and sore throat.   Eyes: Negative for visual disturbance.  Respiratory: Negative for cough and shortness of breath.   Cardiovascular: Negative for chest pain and palpitations.  Gastrointestinal: Positive for abdominal pain, nausea and vomiting. Negative for blood in stool and diarrhea.  Genitourinary: Negative for difficulty urinating, dysuria and frequency.  Musculoskeletal: Negative for back pain and neck pain.  Skin: Negative for rash.  Neurological: Negative for headaches.     Physical Exam Updated Vital Signs BP 107/66 (BP Location: Right Arm)   Pulse 71   Temp 97.8 F (36.6 C) (Oral)   Resp 18   Ht 5\' 5"  (1.651 m)   Wt 47.6 kg (105 lb)   SpO2 99%   BMI 17.47 kg/m   Physical Exam  Constitutional: He appears well-developed and well-nourished. No distress.  Nontoxic-appearing.  HENT:  Head: Normocephalic and atraumatic.  Mouth/Throat: Oropharynx is clear and moist.  Eyes: Pupils are equal, round, and reactive to light. Conjunctivae are normal. Right eye exhibits no discharge. Left eye exhibits no discharge.  Neck: Neck supple.  Cardiovascular: Normal rate, regular rhythm, normal heart sounds and intact distal pulses.  Exam reveals no gallop and no friction rub.   No murmur heard. Heart rate is 100 on exam.  Pulmonary/Chest: Effort normal and breath sounds normal. No respiratory distress. He has no wheezes. He has no rales.  Abdominal: Soft. Bowel sounds are normal. He exhibits no distension and no mass. There is tenderness. There is no rebound and no guarding.  Abdomen is soft. Bowel sounds are present. Patient has epigastric abdominal tenderness to palpation. No peritoneal signs. No lower abdominal tenderness to palpation.  Musculoskeletal: He exhibits no edema.  Lymphadenopathy:    He has no cervical adenopathy.  Neurological: He is  alert. Coordination normal.  Skin: Skin is warm and dry. No rash noted. He is not diaphoretic. No erythema. No pallor.  Psychiatric: He has a normal mood and affect. His behavior is normal.  Nursing note and vitals reviewed.    ED Treatments / Results  Labs (all labs ordered are listed, but only abnormal results are displayed) Labs Reviewed  COMPREHENSIVE METABOLIC PANEL - Abnormal; Notable for the following:       Result Value   Chloride 98 (*)    Glucose, Bld 102 (*)    Total Protein 9.0 (*)    ALT 16 (*)    All other components within normal limits  CBC - Abnormal; Notable for the following:    WBC 11.7 (*)    RDW 16.7 (*)    All other  components within normal limits  URINALYSIS, ROUTINE W REFLEX MICROSCOPIC - Abnormal; Notable for the following:    APPearance CLOUDY (*)    Ketones, ur 15 (*)    All other components within normal limits  LIPASE, BLOOD    EKG  EKG Interpretation None       Radiology Ct Abdomen Pelvis Wo Contrast  Result Date: 07/14/2017 CLINICAL DATA:  Abdominal pain and history of chronic pancreatitis. Prior cholecystectomy, appendectomy and bowel surgery. EXAM: CT ABDOMEN AND PELVIS WITHOUT CONTRAST TECHNIQUE: Multidetector CT imaging of the abdomen and pelvis was performed following the standard protocol without IV contrast. COMPARISON:  07/11/2014 FINDINGS: Lower chest: No acute abnormality. Hepatobiliary: No focal liver abnormality is seen. Status post cholecystectomy. No biliary dilatation. Pancreas: No evidence of acute inflammation surrounding the pancreas, pancreatic calcifications, ductal dilatation, obvious mass lesions or surrounding fluid collections. Spleen: Normal in size without focal abnormality. Adrenals/Urinary Tract: Stable scarring calcification of the right lateral interpolar renal cortex. Stable mildly prominent extrarenal pelvis of the left kidney without hydronephrosis. No renal calculi identified. The bladder is decompressed.  Stomach/Bowel: Evidence again noted of partial gastrectomy and Roux-en-Y bypass. Despite relative decompression of the stomach, the gastric wall appears prominent compared to prior studies and measures up to 1.5 cm in thickness. This could potentially implicate gastric inflammation and correlation is suggested clinically. Gastric wall thickening irregularity does extend into the region of the anastomosis with the jejunum and may involve the jejunum itself. Anastomotic ulceration cannot be excluded by CT and correlation with endoscopy may be helpful if there is any continued significant pain. Ingested oral contrast has reached the colon by the time of imaging. No evidence to suggest small bowel obstruction. No free air or focal abscess is identified Vascular/Lymphatic: Calcified plaque of the abdominal aorta without evidence of aneurysm. No enlarged abdominal or pelvic lymph nodes. Reproductive: Prostate is unremarkable. Other: No abdominal wall hernia or abnormality. No abdominopelvic ascites. Musculoskeletal: No acute or significant osseous findings. IMPRESSION: Compared to prior scans, there is suggestion of some prominent gastric wall thickening and irregularity that appears to extend to the anastomosis with the jejunum post Roux-en-Y bypass. The jejunum itself may also be involved just beyond the anastomosis. Ulceration at the anastomosis cannot be excluded by CT. Correlation with endoscopy may be helpful if there is any continued significant abdominal pain. There is no evidence by CT of bowel perforation or abscess. Electronically Signed   By: Aletta Edouard M.D.   On: 07/14/2017 20:09    Procedures Procedures (including critical care time)  Medications Ordered in ED Medications  ondansetron (ZOFRAN) injection 4 mg (4 mg Intravenous Given 07/14/17 1606)  morphine 4 MG/ML injection 4 mg (4 mg Intravenous Given 07/14/17 1710)  sodium chloride 0.9 % bolus 1,000 mL (0 mLs Intravenous Stopped 07/14/17 1757)   famotidine (PEPCID) IVPB 20 mg premix (0 mg Intravenous Stopped 07/14/17 1745)  sodium chloride 0.9 % bolus 1,000 mL (0 mLs Intravenous Stopped 07/14/17 1912)  HYDROmorphone (DILAUDID) injection 1 mg (1 mg Intravenous Given 07/14/17 1752)  HYDROmorphone (DILAUDID) injection 1 mg (1 mg Intravenous Given 07/14/17 1942)     Initial Impression / Assessment and Plan / ED Course  I have reviewed the triage vital signs and the nursing notes.  Pertinent labs & imaging results that were available during my care of the patient were reviewed by me and considered in my medical decision making (see chart for details).  The patient was counseled on the dangers of tobacco use, and  was advised to quit.  Reviewed strategies to maximize success, including removing cigarettes and smoking materials from environment, support of family/friends and written materials.    This  is a 56 y.o. Male with history of chronic pancreatitis, partial gastrectomy, cholecystectomy and appendectomy presents to the emergency department complaining of a flare of his chronic pancreatitis. Patient reports he's been having pain for the past 3 days. He reports pain to his upper abdomen. He reports this pain is the same as his chronic pancreatitis. Reports associated nausea and vomiting. He has been able to eat or drink very little over the past several days. He denies any hematemesis or hematochezia. He took some Zofran at home without relief of his symptoms. No other treatments attempted prior to arrival. 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 He reports partial gastrectomy previous to this. On arrival the patient has some mild tachycardia. On my exam the patient is afebrile and nontoxic appearing. His abdomen is soft and his upper abdominal tenderness to palpation. Lipase is within normal limits. CMP is unremarkable. CBC shows a white count of 11,700. Urinalysis is without signs of infection. CT abdomen and pelvis was  obtained. Patient has contrast allergy and this was done without contrast due to this. Oral contrast only. This revealed prominent gastric wall thickening and irregularity that appears to extend to the anastomosis with the jejunum post Roux-en-Y bypass. Ulceration cannot be excluded. Recommend correlation with endoscopy.   I called and consulted with general surgeon Dr. Lucia Gaskins. He recommends Protonix and Carafate. He will need follow-up with GI. Patient in follow-up as an outpatient in his clinic. I also consulted with gastrologist Dr. Kimber Relic. She recommend Protonix 40 mg twice a day and Carafate. Later, after paging GI it was determined that the patient does have a GI doctor at the [place]. he tells me he thinks he can get in to see him later this week. I encouraged him to follow-up with GI as he will likely need endoscopy. He agrees. Discussed several times with the patient about smoking cessation as this is likely greatly contributing to his symptoms. He tells me he is feeling much better after treatment here in the emergency department. He no longer has abdominal pain or nausea or vomiting. He is tolerating by mouth without vomiting. He feels ready for discharge. I advised the patient to follow-up with their primary care provider this week. I advised the patient to return to the emergency department with new or worsening symptoms or new concerns. The patient verbalized understanding and agreement with plan.   This patient was discussed with Dr. Sherry Ruffing who agrees with assessment and plan.   Final Clinical Impressions(s) / ED Diagnoses   Final diagnoses:  Gastric wall thickening  Non-intractable vomiting with nausea, unspecified vomiting type  Upper abdominal pain  Smoking    New Prescriptions New Prescriptions   OMEPRAZOLE (PRILOSEC) 40 MG CAPSULE    Take 1 capsule (40 mg total) by mouth 2 (two) times daily before a meal.   SUCRALFATE (CARAFATE) 1 G TABLET    Take 1 tablet (1 g total) by mouth 4  (four) times daily -  with meals and at bedtime.     Waynetta Pean, PA-C 07/14/17 2138    Tegeler, Gwenyth Allegra, MD 07/15/17 0111

## 2017-07-14 NOTE — ED Notes (Signed)
Patient transported to CT 

## 2017-07-14 NOTE — ED Notes (Signed)
Pt is currently drinking contrast, states the pain isn't getting any better. edp to be notified.

## 2017-07-23 ENCOUNTER — Encounter (HOSPITAL_BASED_OUTPATIENT_CLINIC_OR_DEPARTMENT_OTHER): Payer: Self-pay

## 2017-07-23 ENCOUNTER — Emergency Department (HOSPITAL_BASED_OUTPATIENT_CLINIC_OR_DEPARTMENT_OTHER)
Admission: EM | Admit: 2017-07-23 | Discharge: 2017-07-23 | Disposition: A | Discharge status: Home / Self Care | Payer: Medicare HMO | Attending: Emergency Medicine | Admitting: Emergency Medicine

## 2017-07-23 DIAGNOSIS — F17210 Nicotine dependence, cigarettes, uncomplicated: Secondary | ICD-10-CM | POA: Insufficient documentation

## 2017-07-23 DIAGNOSIS — R109 Unspecified abdominal pain: Secondary | ICD-10-CM | POA: Diagnosis not present

## 2017-07-23 DIAGNOSIS — K861 Other chronic pancreatitis: Secondary | ICD-10-CM | POA: Insufficient documentation

## 2017-07-23 DIAGNOSIS — G8929 Other chronic pain: Secondary | ICD-10-CM | POA: Diagnosis not present

## 2017-07-23 DIAGNOSIS — Z79899 Other long term (current) drug therapy: Secondary | ICD-10-CM | POA: Insufficient documentation

## 2017-07-23 LAB — COMPREHENSIVE METABOLIC PANEL
ALBUMIN: 3.9 g/dL (ref 3.5–5.0)
ALK PHOS: 97 U/L (ref 38–126)
ALT: 14 U/L — AB (ref 17–63)
AST: 27 U/L (ref 15–41)
Anion gap: 10 (ref 5–15)
BUN: 9 mg/dL (ref 6–20)
CALCIUM: 9.6 mg/dL (ref 8.9–10.3)
CO2: 24 mmol/L (ref 22–32)
CREATININE: 0.74 mg/dL (ref 0.61–1.24)
Chloride: 102 mmol/L (ref 101–111)
GFR calc Af Amer: >60 mL/min (ref >60)
GFR calc non Af Amer: >60 mL/min (ref >60)
GLUCOSE: 118 mg/dL — AB (ref 65–99)
Potassium: 4.1 mmol/L (ref 3.5–5.1)
Sodium: 136 mmol/L (ref 135–145)
Total Bilirubin: 0.7 mg/dL (ref 0.3–1.2)
Total Protein: 8.3 g/dL — ABNORMAL HIGH (ref 6.5–8.1)

## 2017-07-23 LAB — CBC WITH DIFFERENTIAL/PLATELET
BASOS PCT: 0 %
Basophils Absolute: 0 10*3/uL (ref 0.0–0.1)
Eosinophils Absolute: 0.1 10*3/uL (ref 0.0–0.7)
Eosinophils Relative: 1 %
HCT: 43.5 % (ref 39.0–52.0)
HEMOGLOBIN: 14.7 g/dL (ref 13.0–17.0)
LYMPHS ABS: 1.6 10*3/uL (ref 0.7–4.0)
Lymphocytes Relative: 19 %
MCH: 28.4 pg (ref 26.0–34.0)
MCHC: 33.8 g/dL (ref 30.0–36.0)
MCV: 84 fL (ref 78.0–100.0)
MONO ABS: 0.7 10*3/uL (ref 0.1–1.0)
MONOS PCT: 8 %
NEUTROS PCT: 72 %
Neutro Abs: 6.1 10*3/uL (ref 1.7–7.7)
Platelets: 324 10*3/uL (ref 150–400)
RBC: 5.18 MIL/uL (ref 4.22–5.81)
RDW: 15.8 % — ABNORMAL HIGH (ref 11.5–15.5)
WBC: 8.5 10*3/uL (ref 4.0–10.5)

## 2017-07-23 LAB — LIPASE, BLOOD: Lipase: 48 U/L (ref 11–51)

## 2017-07-23 MED ORDER — ONDANSETRON 4 MG PO TBDP: ORAL_TABLET | ORAL | 0 refills | Status: DC

## 2017-07-23 MED ORDER — PANTOPRAZOLE SODIUM 40 MG IV SOLR
40.0000 mg | Freq: Once | INTRAVENOUS | Status: AC
  Administered 2017-07-23: 40 mg via INTRAVENOUS
  Filled 2017-07-23: qty 40

## 2017-07-23 MED ORDER — HYDROMORPHONE HCL 1 MG/ML IJ SOLN
1.0000 mg | Freq: Once | INTRAMUSCULAR | Status: AC
Start: 2017-07-23 — End: 2017-07-23
  Administered 2017-07-23: 1 mg via INTRAVENOUS
  Filled 2017-07-23: qty 1

## 2017-07-23 MED ORDER — ONDANSETRON HCL 4 MG/2ML IJ SOLN
4.0000 mg | Freq: Once | INTRAMUSCULAR | Status: AC
  Administered 2017-07-23: 4 mg via INTRAVENOUS
  Filled 2017-07-23: qty 2

## 2017-07-23 MED ORDER — SODIUM CHLORIDE 0.9 % IV BOLUS (SEPSIS)
1000.0000 mL | Freq: Once | INTRAVENOUS | Status: AC
  Administered 2017-07-23: 1000 mL via INTRAVENOUS

## 2017-07-23 MED ORDER — GI COCKTAIL ~~LOC~~
30.0000 mL | Freq: Once | ORAL | Status: AC
  Administered 2017-07-23: 30 mL via ORAL
  Filled 2017-07-23: qty 30

## 2017-07-23 NOTE — ED Triage Notes (Signed)
Pt here POV with complaints of N/V/D x 1 day. Pt seen here for same on 8/13 and diagnosed with stomach ulcers. Pt made follow up appt but can not be seen until 9/13, [place] pcp is out of town. Pt reports abdominal pain in his lower abdomen that radiate to his bilat flank. Pt also reports pain to his sternum with increased pain upon deep breathing. Pt speaking in clear and complete sentences and in NAD at this time. Pt has taken prilosec, zofran and pepcid with no relief.

## 2017-07-23 NOTE — ED Notes (Signed)
ED Provider at bedside. 

## 2017-07-23 NOTE — ED Notes (Signed)
Pt educated about not driving or performing other critical tasks (such as operating heavy machinery, caring for infant/toddler/child) due to sedative nature of medications received in ED. Also warned about risks of consuming alcohol or taking other medications with sedative properties. Pt/caregiver verbalized understanding.

## 2017-07-23 NOTE — ED Provider Notes (Signed)
[facility] [place] DEPT [facility] Provider Note   CSN: [id number] Arrival date & time: 07/23/17  0630     History   Chief Complaint Chief Complaint  Patient presents with  . Vomiting    HPI Jerry Knight is a 56 y.o. male.  56 yo M with a chief complaint of abdominal pain. This is a chronic issue for him. He has a history of chronic pancreatitis. Has seen GI multiple times for this in the past as well as is on chronic pain medication for it. Was seen in the ED about 10 days ago with similar complaints. Found to have likely gastritis diagnosed by CAT scan. He was given follow-up with the general surgeon that he was under the impression that he was going to see a gastroenterologist. He has seen Dr. Benson Norway in the past though the patient would prefer to see a different GI doctor. This pain is similar to his past pain. Diffuse amongst the abdomen as well as worst to the epigastrium.   The history is provided by the patient.  Illness  This is a chronic problem. The current episode started yesterday. The problem occurs constantly. The problem has been gradually worsening. Associated symptoms include abdominal pain. Pertinent negatives include no chest pain, no headaches and no shortness of breath. Nothing aggravates the symptoms. Nothing relieves the symptoms. He has tried nothing for the symptoms. The treatment provided no relief.    Past Medical History:  Diagnosis Date  . Chest pain   . Chronic neck pain   . Chronic pain   . Chronic pancreatitis ([place])   . Degenerative disk disease    C 5 &C 6  . Depression   . GI bleed   . Migraine   . Migraine headache   . Pain management contract agreement   . Seizure disorder ([place])    x 7 years  . Seizures ([place])   . Zollinger-Ellison syndrome     Patient Active Problem List   Diagnosis Date Noted  . Loss of weight 01/22/2013  . Hypokalemia 01/22/2013  . Substance abuse 01/22/2013  . Pancreatitis, chronic ([place]) 08/27/2012  . Abdominal pain,  epigastric 08/27/2012  . Chest pain 01/26/2012  . SOB (shortness of breath) 01/26/2012    Past Surgical History:  Procedure Laterality Date  . APPENDECTOMY     per CT 2012- s/p appendectomy  . CHOLECYSTECTOMY    . EYE SURGERY    . HERNIA REPAIR    . PARTIAL GASTRECTOMY    . SMALL INTESTINE SURGERY         Home Medications    Prior to Admission medications   Medication Sig Start Date End Date Taking? Authorizing Provider  bisacodyl (DULCOLAX) 5 MG EC tablet Take 1 tablet (5 mg total) by mouth 2 (two) times daily. 05/01/17   Clayton Bibles, PA-C  Buprenorphine HCl-Naloxone HCl (SUBOXONE SL) Place under the tongue.    [provider]  dicyclomine (BENTYL) 20 MG tablet Take 1 tablet (20 mg total) by mouth 2 (two) times daily. 05/01/17   Clayton Bibles, PA-C  omeprazole (PRILOSEC) 40 MG capsule Take 1 capsule (40 mg total) by mouth 2 (two) times daily before a meal. 07/14/17   Waynetta Pean, PA-C  ondansetron (ZOFRAN ODT) 4 MG disintegrating tablet 4mg  ODT q4 hours prn nausea/vomit 07/23/17   Deno Etienne, DO  ondansetron (ZOFRAN) 4 MG tablet Take 1 tablet (4 mg total) by mouth every 8 (eight) hours as needed for nausea or vomiting. 05/01/17  Clayton Bibles, PA-C  oxyCODONE-acetaminophen (PERCOCET/ROXICET) 5-325 MG per tablet Take 1-2 tablets by mouth every 4 (four) hours as needed for severe pain. 10/11/14   Charlann Lange, PA-C  sucralfate (CARAFATE) 1 g tablet Take 1 tablet (1 g total) by mouth 4 (four) times daily -  with meals and at bedtime. 07/14/17   Waynetta Pean, PA-C  traZODone (DESYREL) 50 MG tablet Take 50 mg by mouth at bedtime.    [provider]    Family History Family History  Problem Relation Age of Onset  . Angina Mother   . Nephrolithiasis Father   . Cancer Brother     Social History Social History  Substance Use Topics  . Smoking status: Current Every Day Smoker    Packs/day: 2.00    Types: Cigarettes    Last attempt to quit: 07/29/2000  .  Smokeless tobacco: Never Used     Comment: about 2 packs a day  . Alcohol use No     Allergies   Iohexol and Sonata [zaleplon]   Review of Systems Review of Systems  Constitutional: Negative for chills and fever.  HENT: Negative for congestion and facial swelling.   Eyes: Negative for discharge and visual disturbance.  Respiratory: Negative for shortness of breath.   Cardiovascular: Negative for chest pain and palpitations.  Gastrointestinal: Positive for abdominal pain, nausea and vomiting. Negative for diarrhea.  Musculoskeletal: Negative for arthralgias and myalgias.  Skin: Negative for color change and rash.  Neurological: Negative for tremors, syncope and headaches.  Psychiatric/Behavioral: Negative for confusion and dysphoric mood.     Physical Exam Updated Vital Signs BP (!) 142/95   Pulse 70   Temp 98.1 F (36.7 C) (Oral)   Resp 18   Ht 5\' 5"  (1.651 m)   Wt 47.6 kg (105 lb)   SpO2 99%   BMI 17.47 kg/m   Physical Exam  Constitutional: He is oriented to person, place, and time.  Cachectic  HENT:  Head: Normocephalic and atraumatic.  Eyes: Pupils are equal, round, and reactive to light. EOM are normal.  Neck: Normal range of motion. Neck supple. No JVD present.  Cardiovascular: Normal rate and regular rhythm.  Exam reveals no gallop and no friction rub.   No murmur heard. Pulmonary/Chest: No respiratory distress. He has no wheezes.  Abdominal: He exhibits no distension and no mass. There is tenderness (diffuse). There is no rebound and no guarding.  Musculoskeletal: Normal range of motion.  Neurological: He is alert and oriented to person, place, and time.  Skin: No rash noted. No pallor.  Psychiatric: He has a normal mood and affect. His behavior is normal.  Nursing note and vitals reviewed.    ED Treatments / Results  Labs (all labs ordered are listed, but only abnormal results are displayed) Labs Reviewed  CBC WITH DIFFERENTIAL/PLATELET - Abnormal;  Notable for the following:       Result Value   RDW 15.8 (*)    All other components within normal limits  COMPREHENSIVE METABOLIC PANEL - Abnormal; Notable for the following:    Glucose, Bld 118 (*)    Total Protein 8.3 (*)    ALT 14 (*)    All other components within normal limits  LIPASE, BLOOD    EKG  EKG Interpretation None       Radiology No results found.  Procedures Procedures (including critical care time)  Medications Ordered in ED Medications  sodium chloride 0.9 % bolus 1,000 mL (1,000 mLs Intravenous New  Bag/Given 07/23/17 0721)  ondansetron (ZOFRAN) injection 4 mg (4 mg Intravenous Given 07/23/17 0721)  HYDROmorphone (DILAUDID) injection 1 mg (1 mg Intravenous Given 07/23/17 0733)  gi cocktail (Maalox,Lidocaine,Donnatal) (30 mLs Oral Given 07/23/17 0732)     Initial Impression / Assessment and Plan / ED Course  I have reviewed the triage vital signs and the nursing notes.  Pertinent labs & imaging results that were available during my care of the patient were reviewed by me and considered in my medical decision making (see chart for details).     56 yo M With a chief complaint of chronic abdominal pain. Will obtain labs give fluids pain medicine and reassess.  Labs unremarkable.  D/c home.  Given GI follow up.   8:00 AM:  I have discussed the diagnosis/risks/treatment options with the patient and believe the pt to be eligible for discharge home to follow-up with GI. We also discussed returning to the ED immediately if new or worsening sx occur. We discussed the sx which are most concerning (e.g., sudden worsening pain, fever, inability to tolerate by mouth) that necessitate immediate return. Medications administered to the patient during their visit and any new prescriptions provided to the patient are listed below.  Medications given during this visit Medications  sodium chloride 0.9 % bolus 1,000 mL (1,000 mLs Intravenous New Bag/Given 07/23/17 0721)    ondansetron (ZOFRAN) injection 4 mg (4 mg Intravenous Given 07/23/17 0721)  HYDROmorphone (DILAUDID) injection 1 mg (1 mg Intravenous Given 07/23/17 0733)  gi cocktail (Maalox,Lidocaine,Donnatal) (30 mLs Oral Given 07/23/17 0732)     The patient appears reasonably screen and/or stabilized for discharge and I doubt any other medical condition or other [facility] requiring further screening, evaluation, or treatment in the ED at this time prior to discharge.    Final Clinical Impressions(s) / ED Diagnoses   Final diagnoses:  Chronic abdominal pain    New Prescriptions New Prescriptions   ONDANSETRON (ZOFRAN ODT) 4 MG DISINTEGRATING TABLET    4mg  ODT q4 hours prn nausea/vomit     Deno Etienne, DO 07/23/17 0800

## 2017-10-02 DIAGNOSIS — F1021 Alcohol dependence, in remission: Secondary | ICD-10-CM | POA: Diagnosis not present

## 2017-10-02 DIAGNOSIS — F1320 Sedative, hypnotic or anxiolytic dependence, uncomplicated: Secondary | ICD-10-CM | POA: Diagnosis not present

## 2017-10-02 DIAGNOSIS — F1220 Cannabis dependence, uncomplicated: Secondary | ICD-10-CM | POA: Diagnosis not present

## 2017-10-02 DIAGNOSIS — F1120 Opioid dependence, uncomplicated: Secondary | ICD-10-CM | POA: Diagnosis not present

## 2017-10-08 DIAGNOSIS — F1120 Opioid dependence, uncomplicated: Secondary | ICD-10-CM | POA: Diagnosis not present

## 2017-10-09 DIAGNOSIS — G4700 Insomnia, unspecified: Secondary | ICD-10-CM | POA: Diagnosis not present

## 2017-10-09 DIAGNOSIS — F1120 Opioid dependence, uncomplicated: Secondary | ICD-10-CM | POA: Diagnosis not present

## 2017-10-09 DIAGNOSIS — F1220 Cannabis dependence, uncomplicated: Secondary | ICD-10-CM | POA: Diagnosis not present

## 2017-10-09 DIAGNOSIS — F1021 Alcohol dependence, in remission: Secondary | ICD-10-CM | POA: Diagnosis not present

## 2017-10-14 DIAGNOSIS — F1120 Opioid dependence, uncomplicated: Secondary | ICD-10-CM | POA: Diagnosis not present

## 2017-10-16 DIAGNOSIS — F1220 Cannabis dependence, uncomplicated: Secondary | ICD-10-CM | POA: Diagnosis not present

## 2017-10-16 DIAGNOSIS — F1320 Sedative, hypnotic or anxiolytic dependence, uncomplicated: Secondary | ICD-10-CM | POA: Diagnosis not present

## 2017-10-16 DIAGNOSIS — F1120 Opioid dependence, uncomplicated: Secondary | ICD-10-CM | POA: Diagnosis not present

## 2017-10-16 DIAGNOSIS — F1021 Alcohol dependence, in remission: Secondary | ICD-10-CM | POA: Diagnosis not present

## 2017-10-17 DIAGNOSIS — F1120 Opioid dependence, uncomplicated: Secondary | ICD-10-CM | POA: Diagnosis not present

## 2017-10-22 DIAGNOSIS — F1120 Opioid dependence, uncomplicated: Secondary | ICD-10-CM | POA: Diagnosis not present

## 2017-10-28 DIAGNOSIS — F1120 Opioid dependence, uncomplicated: Secondary | ICD-10-CM | POA: Diagnosis not present

## 2017-10-30 DIAGNOSIS — F19982 Other psychoactive substance use, unspecified with psychoactive substance-induced sleep disorder: Secondary | ICD-10-CM | POA: Diagnosis not present

## 2017-10-30 DIAGNOSIS — F1320 Sedative, hypnotic or anxiolytic dependence, uncomplicated: Secondary | ICD-10-CM | POA: Diagnosis not present

## 2017-10-30 DIAGNOSIS — F1021 Alcohol dependence, in remission: Secondary | ICD-10-CM | POA: Diagnosis not present

## 2017-10-30 DIAGNOSIS — F1120 Opioid dependence, uncomplicated: Secondary | ICD-10-CM | POA: Diagnosis not present

## 2017-11-04 DIAGNOSIS — F1120 Opioid dependence, uncomplicated: Secondary | ICD-10-CM | POA: Diagnosis not present

## 2017-11-06 DIAGNOSIS — F1021 Alcohol dependence, in remission: Secondary | ICD-10-CM | POA: Diagnosis not present

## 2017-11-06 DIAGNOSIS — F1220 Cannabis dependence, uncomplicated: Secondary | ICD-10-CM | POA: Diagnosis not present

## 2017-11-06 DIAGNOSIS — F1320 Sedative, hypnotic or anxiolytic dependence, uncomplicated: Secondary | ICD-10-CM | POA: Diagnosis not present

## 2017-11-06 DIAGNOSIS — F1120 Opioid dependence, uncomplicated: Secondary | ICD-10-CM | POA: Diagnosis not present

## 2017-11-13 DIAGNOSIS — F1120 Opioid dependence, uncomplicated: Secondary | ICD-10-CM | POA: Diagnosis not present

## 2017-11-18 DIAGNOSIS — F1120 Opioid dependence, uncomplicated: Secondary | ICD-10-CM | POA: Diagnosis not present

## 2017-11-20 DIAGNOSIS — F1120 Opioid dependence, uncomplicated: Secondary | ICD-10-CM | POA: Diagnosis not present

## 2017-11-27 DIAGNOSIS — F1120 Opioid dependence, uncomplicated: Secondary | ICD-10-CM | POA: Diagnosis not present

## 2017-12-04 DIAGNOSIS — F1120 Opioid dependence, uncomplicated: Secondary | ICD-10-CM | POA: Diagnosis not present

## 2017-12-11 DIAGNOSIS — F1120 Opioid dependence, uncomplicated: Secondary | ICD-10-CM | POA: Diagnosis not present

## 2017-12-16 DIAGNOSIS — F1120 Opioid dependence, uncomplicated: Secondary | ICD-10-CM | POA: Diagnosis not present

## 2017-12-18 DIAGNOSIS — F1120 Opioid dependence, uncomplicated: Secondary | ICD-10-CM | POA: Diagnosis not present

## 2017-12-23 DIAGNOSIS — F1120 Opioid dependence, uncomplicated: Secondary | ICD-10-CM | POA: Diagnosis not present

## 2018-01-01 DIAGNOSIS — F1120 Opioid dependence, uncomplicated: Secondary | ICD-10-CM | POA: Diagnosis not present

## 2018-01-15 DIAGNOSIS — F1120 Opioid dependence, uncomplicated: Secondary | ICD-10-CM | POA: Diagnosis not present

## 2018-01-20 DIAGNOSIS — F1120 Opioid dependence, uncomplicated: Secondary | ICD-10-CM | POA: Diagnosis not present

## 2018-01-27 DIAGNOSIS — F1120 Opioid dependence, uncomplicated: Secondary | ICD-10-CM | POA: Diagnosis not present

## 2018-01-29 DIAGNOSIS — F1120 Opioid dependence, uncomplicated: Secondary | ICD-10-CM | POA: Diagnosis not present

## 2018-02-03 DIAGNOSIS — F1120 Opioid dependence, uncomplicated: Secondary | ICD-10-CM | POA: Diagnosis not present

## 2018-02-05 DIAGNOSIS — F1120 Opioid dependence, uncomplicated: Secondary | ICD-10-CM | POA: Diagnosis not present

## 2018-02-10 DIAGNOSIS — F1120 Opioid dependence, uncomplicated: Secondary | ICD-10-CM | POA: Diagnosis not present

## 2018-02-12 DIAGNOSIS — F1120 Opioid dependence, uncomplicated: Secondary | ICD-10-CM | POA: Diagnosis not present

## 2018-02-19 DIAGNOSIS — F1120 Opioid dependence, uncomplicated: Secondary | ICD-10-CM | POA: Diagnosis not present

## 2018-02-24 DIAGNOSIS — F1120 Opioid dependence, uncomplicated: Secondary | ICD-10-CM | POA: Diagnosis not present

## 2018-02-24 DIAGNOSIS — Z7151 Drug abuse counseling and surveillance of drug abuser: Secondary | ICD-10-CM | POA: Diagnosis not present

## 2018-02-26 DIAGNOSIS — F1120 Opioid dependence, uncomplicated: Secondary | ICD-10-CM | POA: Diagnosis not present

## 2018-03-03 DIAGNOSIS — Z7151 Drug abuse counseling and surveillance of drug abuser: Secondary | ICD-10-CM | POA: Diagnosis not present

## 2018-03-03 DIAGNOSIS — F1120 Opioid dependence, uncomplicated: Secondary | ICD-10-CM | POA: Diagnosis not present

## 2018-03-05 DIAGNOSIS — F1120 Opioid dependence, uncomplicated: Secondary | ICD-10-CM | POA: Diagnosis not present

## 2018-03-10 DIAGNOSIS — Z7151 Drug abuse counseling and surveillance of drug abuser: Secondary | ICD-10-CM | POA: Diagnosis not present

## 2018-03-10 DIAGNOSIS — F1120 Opioid dependence, uncomplicated: Secondary | ICD-10-CM | POA: Diagnosis not present

## 2018-03-12 DIAGNOSIS — F1120 Opioid dependence, uncomplicated: Secondary | ICD-10-CM | POA: Diagnosis not present

## 2018-03-17 DIAGNOSIS — Z7151 Drug abuse counseling and surveillance of drug abuser: Secondary | ICD-10-CM | POA: Diagnosis not present

## 2018-03-17 DIAGNOSIS — F1120 Opioid dependence, uncomplicated: Secondary | ICD-10-CM | POA: Diagnosis not present

## 2018-03-19 DIAGNOSIS — F1120 Opioid dependence, uncomplicated: Secondary | ICD-10-CM | POA: Diagnosis not present

## 2018-03-24 DIAGNOSIS — F1120 Opioid dependence, uncomplicated: Secondary | ICD-10-CM | POA: Diagnosis not present

## 2018-03-24 DIAGNOSIS — Z7151 Drug abuse counseling and surveillance of drug abuser: Secondary | ICD-10-CM | POA: Diagnosis not present

## 2018-03-26 DIAGNOSIS — F1120 Opioid dependence, uncomplicated: Secondary | ICD-10-CM | POA: Diagnosis not present

## 2018-03-31 DIAGNOSIS — Z7151 Drug abuse counseling and surveillance of drug abuser: Secondary | ICD-10-CM | POA: Diagnosis not present

## 2018-03-31 DIAGNOSIS — F1120 Opioid dependence, uncomplicated: Secondary | ICD-10-CM | POA: Diagnosis not present

## 2018-04-02 DIAGNOSIS — F1120 Opioid dependence, uncomplicated: Secondary | ICD-10-CM | POA: Diagnosis not present

## 2018-04-02 DIAGNOSIS — Z7151 Drug abuse counseling and surveillance of drug abuser: Secondary | ICD-10-CM | POA: Diagnosis not present

## 2018-04-07 DIAGNOSIS — F1120 Opioid dependence, uncomplicated: Secondary | ICD-10-CM | POA: Diagnosis not present

## 2018-04-07 DIAGNOSIS — Z7151 Drug abuse counseling and surveillance of drug abuser: Secondary | ICD-10-CM | POA: Diagnosis not present

## 2018-04-09 DIAGNOSIS — F1120 Opioid dependence, uncomplicated: Secondary | ICD-10-CM | POA: Diagnosis not present

## 2018-04-09 DIAGNOSIS — Z7151 Drug abuse counseling and surveillance of drug abuser: Secondary | ICD-10-CM | POA: Diagnosis not present

## 2018-04-14 DIAGNOSIS — F1120 Opioid dependence, uncomplicated: Secondary | ICD-10-CM | POA: Diagnosis not present

## 2018-04-14 DIAGNOSIS — Z7151 Drug abuse counseling and surveillance of drug abuser: Secondary | ICD-10-CM | POA: Diagnosis not present

## 2018-04-16 DIAGNOSIS — Z7151 Drug abuse counseling and surveillance of drug abuser: Secondary | ICD-10-CM | POA: Diagnosis not present

## 2018-04-16 DIAGNOSIS — F1120 Opioid dependence, uncomplicated: Secondary | ICD-10-CM | POA: Diagnosis not present

## 2018-04-21 DIAGNOSIS — Z7151 Drug abuse counseling and surveillance of drug abuser: Secondary | ICD-10-CM | POA: Diagnosis not present

## 2018-04-21 DIAGNOSIS — F1120 Opioid dependence, uncomplicated: Secondary | ICD-10-CM | POA: Diagnosis not present

## 2018-04-23 DIAGNOSIS — Z7151 Drug abuse counseling and surveillance of drug abuser: Secondary | ICD-10-CM | POA: Diagnosis not present

## 2018-04-23 DIAGNOSIS — F1120 Opioid dependence, uncomplicated: Secondary | ICD-10-CM | POA: Diagnosis not present

## 2018-04-28 DIAGNOSIS — Z7151 Drug abuse counseling and surveillance of drug abuser: Secondary | ICD-10-CM | POA: Diagnosis not present

## 2018-04-28 DIAGNOSIS — F1120 Opioid dependence, uncomplicated: Secondary | ICD-10-CM | POA: Diagnosis not present

## 2018-04-30 DIAGNOSIS — F1120 Opioid dependence, uncomplicated: Secondary | ICD-10-CM | POA: Diagnosis not present

## 2018-04-30 DIAGNOSIS — Z7151 Drug abuse counseling and surveillance of drug abuser: Secondary | ICD-10-CM | POA: Diagnosis not present

## 2018-05-05 DIAGNOSIS — F1120 Opioid dependence, uncomplicated: Secondary | ICD-10-CM | POA: Diagnosis not present

## 2018-05-05 DIAGNOSIS — Z7151 Drug abuse counseling and surveillance of drug abuser: Secondary | ICD-10-CM | POA: Diagnosis not present

## 2018-05-07 DIAGNOSIS — Z7151 Drug abuse counseling and surveillance of drug abuser: Secondary | ICD-10-CM | POA: Diagnosis not present

## 2018-05-07 DIAGNOSIS — F1120 Opioid dependence, uncomplicated: Secondary | ICD-10-CM | POA: Diagnosis not present

## 2018-05-12 DIAGNOSIS — F1120 Opioid dependence, uncomplicated: Secondary | ICD-10-CM | POA: Diagnosis not present

## 2018-05-12 DIAGNOSIS — Z7151 Drug abuse counseling and surveillance of drug abuser: Secondary | ICD-10-CM | POA: Diagnosis not present

## 2018-05-14 DIAGNOSIS — F1120 Opioid dependence, uncomplicated: Secondary | ICD-10-CM | POA: Diagnosis not present

## 2018-05-14 DIAGNOSIS — Z7151 Drug abuse counseling and surveillance of drug abuser: Secondary | ICD-10-CM | POA: Diagnosis not present

## 2018-05-19 DIAGNOSIS — Z7151 Drug abuse counseling and surveillance of drug abuser: Secondary | ICD-10-CM | POA: Diagnosis not present

## 2018-05-19 DIAGNOSIS — F1120 Opioid dependence, uncomplicated: Secondary | ICD-10-CM | POA: Diagnosis not present

## 2018-05-21 DIAGNOSIS — Z7151 Drug abuse counseling and surveillance of drug abuser: Secondary | ICD-10-CM | POA: Diagnosis not present

## 2018-05-21 DIAGNOSIS — F1120 Opioid dependence, uncomplicated: Secondary | ICD-10-CM | POA: Diagnosis not present

## 2018-05-26 DIAGNOSIS — F1120 Opioid dependence, uncomplicated: Secondary | ICD-10-CM | POA: Diagnosis not present

## 2018-05-26 DIAGNOSIS — Z7151 Drug abuse counseling and surveillance of drug abuser: Secondary | ICD-10-CM | POA: Diagnosis not present

## 2018-05-28 DIAGNOSIS — F1120 Opioid dependence, uncomplicated: Secondary | ICD-10-CM | POA: Diagnosis not present

## 2018-05-28 DIAGNOSIS — Z7151 Drug abuse counseling and surveillance of drug abuser: Secondary | ICD-10-CM | POA: Diagnosis not present

## 2018-05-30 ENCOUNTER — Encounter (HOSPITAL_COMMUNITY): Payer: Self-pay | Admitting: *Deleted

## 2018-05-30 ENCOUNTER — Emergency Department (HOSPITAL_COMMUNITY)
Admission: EM | Admit: 2018-05-30 | Discharge: 2018-05-30 | Disposition: A | Discharge status: Home / Self Care | Payer: No Typology Code available for payment source | Attending: Emergency Medicine | Admitting: Emergency Medicine

## 2018-05-30 ENCOUNTER — Emergency Department (HOSPITAL_COMMUNITY): Payer: No Typology Code available for payment source

## 2018-05-30 ENCOUNTER — Other Ambulatory Visit: Payer: Self-pay

## 2018-05-30 DIAGNOSIS — Z79899 Other long term (current) drug therapy: Secondary | ICD-10-CM | POA: Diagnosis not present

## 2018-05-30 DIAGNOSIS — F17210 Nicotine dependence, cigarettes, uncomplicated: Secondary | ICD-10-CM | POA: Insufficient documentation

## 2018-05-30 DIAGNOSIS — S299XXA Unspecified injury of thorax, initial encounter: Secondary | ICD-10-CM | POA: Diagnosis present

## 2018-05-30 DIAGNOSIS — S20211A Contusion of right front wall of thorax, initial encounter: Secondary | ICD-10-CM | POA: Diagnosis not present

## 2018-05-30 DIAGNOSIS — Y939 Activity, unspecified: Secondary | ICD-10-CM | POA: Diagnosis not present

## 2018-05-30 DIAGNOSIS — Y92410 Unspecified street and highway as the place of occurrence of the external cause: Secondary | ICD-10-CM | POA: Insufficient documentation

## 2018-05-30 DIAGNOSIS — M25511 Pain in right shoulder: Secondary | ICD-10-CM | POA: Diagnosis not present

## 2018-05-30 DIAGNOSIS — S8002XA Contusion of left knee, initial encounter: Secondary | ICD-10-CM | POA: Diagnosis not present

## 2018-05-30 DIAGNOSIS — Y998 Other external cause status: Secondary | ICD-10-CM | POA: Diagnosis not present

## 2018-05-30 DIAGNOSIS — R079 Chest pain, unspecified: Secondary | ICD-10-CM | POA: Diagnosis not present

## 2018-05-30 DIAGNOSIS — R0789 Other chest pain: Secondary | ICD-10-CM | POA: Diagnosis not present

## 2018-05-30 LAB — BASIC METABOLIC PANEL
ANION GAP: 9 (ref 5–15)
BUN: 8 mg/dL (ref 6–20)
CO2: 28 mmol/L (ref 22–32)
Calcium: 9.6 mg/dL (ref 8.9–10.3)
Chloride: 97 mmol/L — ABNORMAL LOW (ref 98–111)
Creatinine, Ser: 0.84 mg/dL (ref 0.61–1.24)
GFR calc non Af Amer: >60 mL/min (ref >60)
Glucose, Bld: 108 mg/dL — ABNORMAL HIGH (ref 70–99)
POTASSIUM: 3.8 mmol/L (ref 3.5–5.1)
Sodium: 134 mmol/L — ABNORMAL LOW (ref 135–145)

## 2018-05-30 LAB — CBC
HEMATOCRIT: 42.3 % (ref 39.0–52.0)
HEMOGLOBIN: 14.1 g/dL (ref 13.0–17.0)
MCH: 29.1 pg (ref 26.0–34.0)
MCHC: 33.3 g/dL (ref 30.0–36.0)
MCV: 87.4 fL (ref 78.0–100.0)
Platelets: 301 10*3/uL (ref 150–400)
RBC: 4.84 MIL/uL (ref 4.22–5.81)
RDW: 13.8 % (ref 11.5–15.5)
WBC: 8.4 10*3/uL (ref 4.0–10.5)

## 2018-05-30 LAB — I-STAT TROPONIN, ED: TROPONIN I, POC: 0 ng/mL (ref 0.00–0.08)

## 2018-05-30 MED ORDER — ACETAMINOPHEN 500 MG PO TABS: 1000.0000 mg | ORAL_TABLET | Freq: Three times a day (TID) | ORAL | 0 refills | Status: DC | PRN
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
## 2018-06-02 DIAGNOSIS — F1120 Opioid dependence, uncomplicated: Secondary | ICD-10-CM | POA: Diagnosis not present

## 2018-06-02 DIAGNOSIS — Z7151 Drug abuse counseling and surveillance of drug abuser: Secondary | ICD-10-CM | POA: Diagnosis not present

## 2018-06-09 DIAGNOSIS — Z7151 Drug abuse counseling and surveillance of drug abuser: Secondary | ICD-10-CM | POA: Diagnosis not present

## 2018-06-09 DIAGNOSIS — F1120 Opioid dependence, uncomplicated: Secondary | ICD-10-CM | POA: Diagnosis not present

## 2018-06-11 DIAGNOSIS — F1120 Opioid dependence, uncomplicated: Secondary | ICD-10-CM | POA: Diagnosis not present

## 2018-06-16 DIAGNOSIS — F1120 Opioid dependence, uncomplicated: Secondary | ICD-10-CM | POA: Diagnosis not present

## 2018-06-16 DIAGNOSIS — Z7151 Drug abuse counseling and surveillance of drug abuser: Secondary | ICD-10-CM | POA: Diagnosis not present

## 2018-06-18 DIAGNOSIS — Z7151 Drug abuse counseling and surveillance of drug abuser: Secondary | ICD-10-CM | POA: Diagnosis not present

## 2018-06-18 DIAGNOSIS — F1120 Opioid dependence, uncomplicated: Secondary | ICD-10-CM | POA: Diagnosis not present

## 2018-06-23 DIAGNOSIS — Z7151 Drug abuse counseling and surveillance of drug abuser: Secondary | ICD-10-CM | POA: Diagnosis not present

## 2018-06-23 DIAGNOSIS — F1120 Opioid dependence, uncomplicated: Secondary | ICD-10-CM | POA: Diagnosis not present

## 2018-06-25 DIAGNOSIS — Z7151 Drug abuse counseling and surveillance of drug abuser: Secondary | ICD-10-CM | POA: Diagnosis not present

## 2018-06-25 DIAGNOSIS — F1120 Opioid dependence, uncomplicated: Secondary | ICD-10-CM | POA: Diagnosis not present

## 2018-06-30 DIAGNOSIS — F1120 Opioid dependence, uncomplicated: Secondary | ICD-10-CM | POA: Diagnosis not present

## 2018-06-30 DIAGNOSIS — Z7151 Drug abuse counseling and surveillance of drug abuser: Secondary | ICD-10-CM | POA: Diagnosis not present

## 2018-07-02 DIAGNOSIS — Z7151 Drug abuse counseling and surveillance of drug abuser: Secondary | ICD-10-CM | POA: Diagnosis not present

## 2018-07-02 DIAGNOSIS — F1120 Opioid dependence, uncomplicated: Secondary | ICD-10-CM | POA: Diagnosis not present

## 2018-07-07 DIAGNOSIS — Z7151 Drug abuse counseling and surveillance of drug abuser: Secondary | ICD-10-CM | POA: Diagnosis not present

## 2018-07-07 DIAGNOSIS — F1120 Opioid dependence, uncomplicated: Secondary | ICD-10-CM | POA: Diagnosis not present

## 2018-07-14 DIAGNOSIS — F1120 Opioid dependence, uncomplicated: Secondary | ICD-10-CM | POA: Diagnosis not present

## 2018-07-14 DIAGNOSIS — Z7151 Drug abuse counseling and surveillance of drug abuser: Secondary | ICD-10-CM | POA: Diagnosis not present

## 2018-07-16 DIAGNOSIS — F1120 Opioid dependence, uncomplicated: Secondary | ICD-10-CM | POA: Diagnosis not present

## 2018-07-16 DIAGNOSIS — Z7151 Drug abuse counseling and surveillance of drug abuser: Secondary | ICD-10-CM | POA: Diagnosis not present

## 2018-07-21 DIAGNOSIS — Z7151 Drug abuse counseling and surveillance of drug abuser: Secondary | ICD-10-CM | POA: Diagnosis not present

## 2018-07-21 DIAGNOSIS — F1120 Opioid dependence, uncomplicated: Secondary | ICD-10-CM | POA: Diagnosis not present

## 2018-07-22 ENCOUNTER — Encounter (HOSPITAL_BASED_OUTPATIENT_CLINIC_OR_DEPARTMENT_OTHER): Payer: Self-pay

## 2018-07-22 ENCOUNTER — Emergency Department (HOSPITAL_BASED_OUTPATIENT_CLINIC_OR_DEPARTMENT_OTHER)
Admission: EM | Admit: 2018-07-22 | Discharge: 2018-07-22 | Disposition: A | Discharge status: Home / Self Care | Payer: Medicare HMO | Attending: Emergency Medicine | Admitting: Emergency Medicine

## 2018-07-22 ENCOUNTER — Other Ambulatory Visit: Payer: Self-pay

## 2018-07-22 ENCOUNTER — Emergency Department (HOSPITAL_BASED_OUTPATIENT_CLINIC_OR_DEPARTMENT_OTHER): Payer: Medicare HMO

## 2018-07-22 DIAGNOSIS — Z79899 Other long term (current) drug therapy: Secondary | ICD-10-CM | POA: Diagnosis not present

## 2018-07-22 DIAGNOSIS — M25531 Pain in right wrist: Secondary | ICD-10-CM | POA: Insufficient documentation

## 2018-07-22 DIAGNOSIS — F17210 Nicotine dependence, cigarettes, uncomplicated: Secondary | ICD-10-CM | POA: Insufficient documentation

## 2018-07-22 MED ORDER — CLINDAMYCIN HCL 150 MG PO CAPS
450.0000 mg | ORAL_CAPSULE | Freq: Three times a day (TID) | ORAL | 0 refills | Status: DC
Start: 2018-07-22 — End: 2019-02-01

## 2018-07-22 MED ORDER — CLINDAMYCIN HCL 150 MG PO CAPS
450.0000 mg | ORAL_CAPSULE | Freq: Once | ORAL | Status: AC
  Administered 2018-07-22: 450 mg via ORAL
  Filled 2018-07-22: qty 3

## 2018-07-22 MED ORDER — PROBIOTIC 250 MG PO CAPS: 1.0000 | ORAL_CAPSULE | Freq: Every day | ORAL | 0 refills | Status: DC

## 2018-07-22 NOTE — ED Provider Notes (Signed)
Medical screening examination/treatment/procedure(s) were conducted as a shared visit with non-physician practitioner(s) and myself.  I personally evaluated the patient during the encounter.  57 year old with here with right wrist pain.  Patient states that is been intermittent for the last couple days.  On exam he has some erythema, tenderness and redness around the dorsal side of his right wrist on the lateral region.  States it hurts worse when stretching at skin.  Does not have much pain with volar and dorsal movements but more so with lateral/medial movements.  X-ray without obvious effusion.  Suspect this probably cellulitis overlying part of his joint that is why the pain is only in the certain area with certain movements.  Consider septic arthritis versus gouty arthritis as possibilities however he does have full wrist pain, fever, history of the same.  Will start on antibiotics and return here if not improving in 1 to 2 days.    Merrily Pew, MD 07/22/18 [phone]

## 2018-07-22 NOTE — ED Notes (Signed)
Patient transported to X-ray 
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
## 2018-07-22 NOTE — ED Triage Notes (Signed)
C/o pain/swelling to right wrist x 3 days-denies recent injury-NAD-steady gait

## 2018-07-22 NOTE — Discharge Instructions (Addendum)
Take clindamycin as prescribed for the next week.  Take with probiotic to help prevent stomach upset.  If your symptoms are not improving in 2 days, please return.  If they are worsening prior to that, please return sooner.  Please return if you develop any increasing pain, redness, swelling, red streaking, or fevers.

## 2018-07-23 DIAGNOSIS — Z7151 Drug abuse counseling and surveillance of drug abuser: Secondary | ICD-10-CM | POA: Diagnosis not present

## 2018-07-23 DIAGNOSIS — F1120 Opioid dependence, uncomplicated: Secondary | ICD-10-CM | POA: Diagnosis not present

## 2018-07-28 DIAGNOSIS — Z7151 Drug abuse counseling and surveillance of drug abuser: Secondary | ICD-10-CM | POA: Diagnosis not present

## 2018-07-28 DIAGNOSIS — F1120 Opioid dependence, uncomplicated: Secondary | ICD-10-CM | POA: Diagnosis not present

## 2018-07-30 DIAGNOSIS — F1120 Opioid dependence, uncomplicated: Secondary | ICD-10-CM | POA: Diagnosis not present

## 2018-07-30 DIAGNOSIS — Z7151 Drug abuse counseling and surveillance of drug abuser: Secondary | ICD-10-CM | POA: Diagnosis not present

## 2018-08-04 DIAGNOSIS — F1120 Opioid dependence, uncomplicated: Secondary | ICD-10-CM | POA: Diagnosis not present

## 2018-08-04 DIAGNOSIS — Z7151 Drug abuse counseling and surveillance of drug abuser: Secondary | ICD-10-CM | POA: Diagnosis not present

## 2018-08-06 DIAGNOSIS — Z7151 Drug abuse counseling and surveillance of drug abuser: Secondary | ICD-10-CM | POA: Diagnosis not present

## 2018-08-06 DIAGNOSIS — F1120 Opioid dependence, uncomplicated: Secondary | ICD-10-CM | POA: Diagnosis not present

## 2018-08-11 DIAGNOSIS — Z7151 Drug abuse counseling and surveillance of drug abuser: Secondary | ICD-10-CM | POA: Diagnosis not present

## 2018-08-11 DIAGNOSIS — F1120 Opioid dependence, uncomplicated: Secondary | ICD-10-CM | POA: Diagnosis not present

## 2018-08-13 DIAGNOSIS — Z7151 Drug abuse counseling and surveillance of drug abuser: Secondary | ICD-10-CM | POA: Diagnosis not present

## 2018-08-13 DIAGNOSIS — F1120 Opioid dependence, uncomplicated: Secondary | ICD-10-CM | POA: Diagnosis not present

## 2018-08-18 DIAGNOSIS — Z7151 Drug abuse counseling and surveillance of drug abuser: Secondary | ICD-10-CM | POA: Diagnosis not present

## 2018-08-18 DIAGNOSIS — F1120 Opioid dependence, uncomplicated: Secondary | ICD-10-CM | POA: Diagnosis not present

## 2018-08-20 DIAGNOSIS — Z7151 Drug abuse counseling and surveillance of drug abuser: Secondary | ICD-10-CM | POA: Diagnosis not present

## 2018-08-20 DIAGNOSIS — F1120 Opioid dependence, uncomplicated: Secondary | ICD-10-CM | POA: Diagnosis not present

## 2018-08-25 DIAGNOSIS — F1120 Opioid dependence, uncomplicated: Secondary | ICD-10-CM | POA: Diagnosis not present

## 2018-08-25 DIAGNOSIS — Z7151 Drug abuse counseling and surveillance of drug abuser: Secondary | ICD-10-CM | POA: Diagnosis not present

## 2018-08-27 DIAGNOSIS — F1120 Opioid dependence, uncomplicated: Secondary | ICD-10-CM | POA: Diagnosis not present

## 2018-08-27 DIAGNOSIS — Z7151 Drug abuse counseling and surveillance of drug abuser: Secondary | ICD-10-CM | POA: Diagnosis not present

## 2018-09-01 DIAGNOSIS — Z7151 Drug abuse counseling and surveillance of drug abuser: Secondary | ICD-10-CM | POA: Diagnosis not present

## 2018-09-01 DIAGNOSIS — F1120 Opioid dependence, uncomplicated: Secondary | ICD-10-CM | POA: Diagnosis not present

## 2018-09-03 DIAGNOSIS — Z7151 Drug abuse counseling and surveillance of drug abuser: Secondary | ICD-10-CM | POA: Diagnosis not present

## 2018-09-03 DIAGNOSIS — F1120 Opioid dependence, uncomplicated: Secondary | ICD-10-CM | POA: Diagnosis not present

## 2018-09-08 DIAGNOSIS — F1120 Opioid dependence, uncomplicated: Secondary | ICD-10-CM | POA: Diagnosis not present

## 2018-09-08 DIAGNOSIS — Z7151 Drug abuse counseling and surveillance of drug abuser: Secondary | ICD-10-CM | POA: Diagnosis not present

## 2018-09-10 DIAGNOSIS — F1120 Opioid dependence, uncomplicated: Secondary | ICD-10-CM | POA: Diagnosis not present

## 2018-09-10 DIAGNOSIS — Z7151 Drug abuse counseling and surveillance of drug abuser: Secondary | ICD-10-CM | POA: Diagnosis not present

## 2018-09-15 DIAGNOSIS — F1120 Opioid dependence, uncomplicated: Secondary | ICD-10-CM | POA: Diagnosis not present

## 2018-09-15 DIAGNOSIS — Z7151 Drug abuse counseling and surveillance of drug abuser: Secondary | ICD-10-CM | POA: Diagnosis not present

## 2018-09-17 DIAGNOSIS — F1120 Opioid dependence, uncomplicated: Secondary | ICD-10-CM | POA: Diagnosis not present

## 2018-09-17 DIAGNOSIS — Z7151 Drug abuse counseling and surveillance of drug abuser: Secondary | ICD-10-CM | POA: Diagnosis not present

## 2018-09-22 DIAGNOSIS — Z7151 Drug abuse counseling and surveillance of drug abuser: Secondary | ICD-10-CM | POA: Diagnosis not present

## 2018-09-22 DIAGNOSIS — F1120 Opioid dependence, uncomplicated: Secondary | ICD-10-CM | POA: Diagnosis not present

## 2018-09-24 DIAGNOSIS — Z7151 Drug abuse counseling and surveillance of drug abuser: Secondary | ICD-10-CM | POA: Diagnosis not present

## 2018-09-24 DIAGNOSIS — F1120 Opioid dependence, uncomplicated: Secondary | ICD-10-CM | POA: Diagnosis not present

## 2018-09-29 DIAGNOSIS — F1120 Opioid dependence, uncomplicated: Secondary | ICD-10-CM | POA: Diagnosis not present

## 2018-09-29 DIAGNOSIS — Z7151 Drug abuse counseling and surveillance of drug abuser: Secondary | ICD-10-CM | POA: Diagnosis not present

## 2018-10-01 DIAGNOSIS — Z7151 Drug abuse counseling and surveillance of drug abuser: Secondary | ICD-10-CM | POA: Diagnosis not present

## 2018-10-01 DIAGNOSIS — F1120 Opioid dependence, uncomplicated: Secondary | ICD-10-CM | POA: Diagnosis not present

## 2018-10-06 DIAGNOSIS — F1120 Opioid dependence, uncomplicated: Secondary | ICD-10-CM | POA: Diagnosis not present

## 2018-10-06 DIAGNOSIS — Z7151 Drug abuse counseling and surveillance of drug abuser: Secondary | ICD-10-CM | POA: Diagnosis not present

## 2018-10-13 DIAGNOSIS — F1120 Opioid dependence, uncomplicated: Secondary | ICD-10-CM | POA: Diagnosis not present

## 2018-10-13 DIAGNOSIS — Z7151 Drug abuse counseling and surveillance of drug abuser: Secondary | ICD-10-CM | POA: Diagnosis not present

## 2018-10-15 DIAGNOSIS — Z7151 Drug abuse counseling and surveillance of drug abuser: Secondary | ICD-10-CM | POA: Diagnosis not present

## 2018-10-15 DIAGNOSIS — F1120 Opioid dependence, uncomplicated: Secondary | ICD-10-CM | POA: Diagnosis not present

## 2018-10-20 DIAGNOSIS — Z7151 Drug abuse counseling and surveillance of drug abuser: Secondary | ICD-10-CM | POA: Diagnosis not present

## 2018-10-20 DIAGNOSIS — F1120 Opioid dependence, uncomplicated: Secondary | ICD-10-CM | POA: Diagnosis not present

## 2018-10-22 DIAGNOSIS — Z7151 Drug abuse counseling and surveillance of drug abuser: Secondary | ICD-10-CM | POA: Diagnosis not present

## 2018-10-22 DIAGNOSIS — F1120 Opioid dependence, uncomplicated: Secondary | ICD-10-CM | POA: Diagnosis not present

## 2018-10-27 DIAGNOSIS — Z7151 Drug abuse counseling and surveillance of drug abuser: Secondary | ICD-10-CM | POA: Diagnosis not present

## 2018-10-27 DIAGNOSIS — F1120 Opioid dependence, uncomplicated: Secondary | ICD-10-CM | POA: Diagnosis not present

## 2018-11-03 DIAGNOSIS — F1120 Opioid dependence, uncomplicated: Secondary | ICD-10-CM | POA: Diagnosis not present

## 2018-11-03 DIAGNOSIS — Z7151 Drug abuse counseling and surveillance of drug abuser: Secondary | ICD-10-CM | POA: Diagnosis not present

## 2018-11-05 DIAGNOSIS — F1120 Opioid dependence, uncomplicated: Secondary | ICD-10-CM | POA: Diagnosis not present

## 2018-11-05 DIAGNOSIS — Z7151 Drug abuse counseling and surveillance of drug abuser: Secondary | ICD-10-CM | POA: Diagnosis not present

## 2018-11-10 DIAGNOSIS — F1120 Opioid dependence, uncomplicated: Secondary | ICD-10-CM | POA: Diagnosis not present

## 2018-11-10 DIAGNOSIS — Z7151 Drug abuse counseling and surveillance of drug abuser: Secondary | ICD-10-CM | POA: Diagnosis not present

## 2018-11-12 DIAGNOSIS — Z7151 Drug abuse counseling and surveillance of drug abuser: Secondary | ICD-10-CM | POA: Diagnosis not present

## 2018-11-12 DIAGNOSIS — F1120 Opioid dependence, uncomplicated: Secondary | ICD-10-CM | POA: Diagnosis not present

## 2018-11-17 DIAGNOSIS — Z7151 Drug abuse counseling and surveillance of drug abuser: Secondary | ICD-10-CM | POA: Diagnosis not present

## 2018-11-17 DIAGNOSIS — F1120 Opioid dependence, uncomplicated: Secondary | ICD-10-CM | POA: Diagnosis not present

## 2018-11-19 DIAGNOSIS — F1120 Opioid dependence, uncomplicated: Secondary | ICD-10-CM | POA: Diagnosis not present

## 2018-11-19 DIAGNOSIS — Z7151 Drug abuse counseling and surveillance of drug abuser: Secondary | ICD-10-CM | POA: Diagnosis not present

## 2018-11-26 DIAGNOSIS — F1120 Opioid dependence, uncomplicated: Secondary | ICD-10-CM | POA: Diagnosis not present

## 2018-11-26 DIAGNOSIS — Z7151 Drug abuse counseling and surveillance of drug abuser: Secondary | ICD-10-CM | POA: Diagnosis not present

## 2018-12-01 DIAGNOSIS — Z7151 Drug abuse counseling and surveillance of drug abuser: Secondary | ICD-10-CM | POA: Diagnosis not present

## 2018-12-01 DIAGNOSIS — F1120 Opioid dependence, uncomplicated: Secondary | ICD-10-CM | POA: Diagnosis not present

## 2018-12-03 DIAGNOSIS — F1120 Opioid dependence, uncomplicated: Secondary | ICD-10-CM | POA: Diagnosis not present

## 2018-12-03 DIAGNOSIS — Z7151 Drug abuse counseling and surveillance of drug abuser: Secondary | ICD-10-CM | POA: Diagnosis not present

## 2018-12-08 DIAGNOSIS — Z7151 Drug abuse counseling and surveillance of drug abuser: Secondary | ICD-10-CM | POA: Diagnosis not present

## 2018-12-08 DIAGNOSIS — F1120 Opioid dependence, uncomplicated: Secondary | ICD-10-CM | POA: Diagnosis not present

## 2018-12-10 DIAGNOSIS — Z7151 Drug abuse counseling and surveillance of drug abuser: Secondary | ICD-10-CM | POA: Diagnosis not present

## 2018-12-10 DIAGNOSIS — F1120 Opioid dependence, uncomplicated: Secondary | ICD-10-CM | POA: Diagnosis not present

## 2018-12-15 DIAGNOSIS — Z7151 Drug abuse counseling and surveillance of drug abuser: Secondary | ICD-10-CM | POA: Diagnosis not present

## 2018-12-15 DIAGNOSIS — F1120 Opioid dependence, uncomplicated: Secondary | ICD-10-CM | POA: Diagnosis not present

## 2018-12-17 DIAGNOSIS — Z7151 Drug abuse counseling and surveillance of drug abuser: Secondary | ICD-10-CM | POA: Diagnosis not present

## 2018-12-17 DIAGNOSIS — F1120 Opioid dependence, uncomplicated: Secondary | ICD-10-CM | POA: Diagnosis not present

## 2018-12-22 DIAGNOSIS — Z7151 Drug abuse counseling and surveillance of drug abuser: Secondary | ICD-10-CM | POA: Diagnosis not present

## 2018-12-22 DIAGNOSIS — F1120 Opioid dependence, uncomplicated: Secondary | ICD-10-CM | POA: Diagnosis not present

## 2018-12-24 DIAGNOSIS — Z7151 Drug abuse counseling and surveillance of drug abuser: Secondary | ICD-10-CM | POA: Diagnosis not present

## 2018-12-24 DIAGNOSIS — F1120 Opioid dependence, uncomplicated: Secondary | ICD-10-CM | POA: Diagnosis not present

## 2018-12-29 DIAGNOSIS — Z7151 Drug abuse counseling and surveillance of drug abuser: Secondary | ICD-10-CM | POA: Diagnosis not present

## 2018-12-29 DIAGNOSIS — F1120 Opioid dependence, uncomplicated: Secondary | ICD-10-CM | POA: Diagnosis not present

## 2018-12-31 DIAGNOSIS — Z7151 Drug abuse counseling and surveillance of drug abuser: Secondary | ICD-10-CM | POA: Diagnosis not present

## 2018-12-31 DIAGNOSIS — F1120 Opioid dependence, uncomplicated: Secondary | ICD-10-CM | POA: Diagnosis not present

## 2019-01-05 DIAGNOSIS — Z7151 Drug abuse counseling and surveillance of drug abuser: Secondary | ICD-10-CM | POA: Diagnosis not present

## 2019-01-05 DIAGNOSIS — F1120 Opioid dependence, uncomplicated: Secondary | ICD-10-CM | POA: Diagnosis not present

## 2019-01-07 DIAGNOSIS — Z7151 Drug abuse counseling and surveillance of drug abuser: Secondary | ICD-10-CM | POA: Diagnosis not present

## 2019-01-07 DIAGNOSIS — F1120 Opioid dependence, uncomplicated: Secondary | ICD-10-CM | POA: Diagnosis not present

## 2019-01-12 DIAGNOSIS — F1120 Opioid dependence, uncomplicated: Secondary | ICD-10-CM | POA: Diagnosis not present

## 2019-01-12 DIAGNOSIS — Z7151 Drug abuse counseling and surveillance of drug abuser: Secondary | ICD-10-CM | POA: Diagnosis not present

## 2019-01-14 DIAGNOSIS — F1120 Opioid dependence, uncomplicated: Secondary | ICD-10-CM | POA: Diagnosis not present

## 2019-01-14 DIAGNOSIS — Z7151 Drug abuse counseling and surveillance of drug abuser: Secondary | ICD-10-CM | POA: Diagnosis not present

## 2019-01-19 DIAGNOSIS — F1120 Opioid dependence, uncomplicated: Secondary | ICD-10-CM | POA: Diagnosis not present

## 2019-01-19 DIAGNOSIS — Z7151 Drug abuse counseling and surveillance of drug abuser: Secondary | ICD-10-CM | POA: Diagnosis not present

## 2019-01-21 DIAGNOSIS — F1120 Opioid dependence, uncomplicated: Secondary | ICD-10-CM | POA: Diagnosis not present

## 2019-01-21 DIAGNOSIS — Z7151 Drug abuse counseling and surveillance of drug abuser: Secondary | ICD-10-CM | POA: Diagnosis not present

## 2019-01-24 DIAGNOSIS — F1120 Opioid dependence, uncomplicated: Secondary | ICD-10-CM | POA: Diagnosis not present

## 2019-01-26 DIAGNOSIS — Z7151 Drug abuse counseling and surveillance of drug abuser: Secondary | ICD-10-CM | POA: Diagnosis not present

## 2019-01-26 DIAGNOSIS — F1120 Opioid dependence, uncomplicated: Secondary | ICD-10-CM | POA: Diagnosis not present

## 2019-01-28 DIAGNOSIS — F1120 Opioid dependence, uncomplicated: Secondary | ICD-10-CM | POA: Diagnosis not present

## 2019-01-28 DIAGNOSIS — Z7151 Drug abuse counseling and surveillance of drug abuser: Secondary | ICD-10-CM | POA: Diagnosis not present

## 2019-02-01 ENCOUNTER — Other Ambulatory Visit: Payer: Self-pay

## 2019-02-01 ENCOUNTER — Encounter: Payer: Self-pay | Admitting: Family Medicine

## 2019-02-01 ENCOUNTER — Ambulatory Visit (INDEPENDENT_AMBULATORY_CARE_PROVIDER_SITE_OTHER): Payer: Medicare HMO | Admitting: Family Medicine

## 2019-02-01 VITALS — BP 104/76 | HR 100 | Temp 97.3°F | Ht 65.0 in | Wt 112.0 lb

## 2019-02-01 DIAGNOSIS — F1120 Opioid dependence, uncomplicated: Secondary | ICD-10-CM

## 2019-02-01 NOTE — Patient Instructions (Signed)
° ° ° °  If you have lab work done today you will be contacted with your lab results within the next 2 weeks.  If you have not heard from us then please contact us. The fastest way to get your results is to register for My Chart. ° ° °IF you received an x-ray today, you will receive an invoice from [facility]. Please contact [facility] at [phone] with questions or concerns regarding your invoice.  ° °IF you received labwork today, you will receive an invoice from [facility]. Please contact [facility] at [phone] with questions or concerns regarding your invoice.  ° °Our billing staff will not be able to assist you with questions regarding bills from these companies. ° °You will be contacted with the lab results as soon as they are available. The fastest way to get your results is to activate your My Chart account. Instructions are located on the last page of this paperwork. If you have not heard from us regarding the results in 2 weeks, please contact this office. °  ° ° ° °
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
## 2019-02-02 DIAGNOSIS — F1120 Opioid dependence, uncomplicated: Secondary | ICD-10-CM | POA: Diagnosis not present

## 2019-02-02 DIAGNOSIS — Z7151 Drug abuse counseling and surveillance of drug abuser: Secondary | ICD-10-CM | POA: Diagnosis not present

## 2019-02-04 DIAGNOSIS — Z7151 Drug abuse counseling and surveillance of drug abuser: Secondary | ICD-10-CM | POA: Diagnosis not present

## 2019-02-04 DIAGNOSIS — F1120 Opioid dependence, uncomplicated: Secondary | ICD-10-CM | POA: Diagnosis not present

## 2019-02-09 DIAGNOSIS — F1120 Opioid dependence, uncomplicated: Secondary | ICD-10-CM | POA: Diagnosis not present

## 2019-02-09 DIAGNOSIS — Z7151 Drug abuse counseling and surveillance of drug abuser: Secondary | ICD-10-CM | POA: Diagnosis not present

## 2019-02-11 DIAGNOSIS — Z7151 Drug abuse counseling and surveillance of drug abuser: Secondary | ICD-10-CM | POA: Diagnosis not present

## 2019-02-11 DIAGNOSIS — F1120 Opioid dependence, uncomplicated: Secondary | ICD-10-CM | POA: Diagnosis not present

## 2019-02-16 DIAGNOSIS — F1120 Opioid dependence, uncomplicated: Secondary | ICD-10-CM | POA: Diagnosis not present

## 2019-02-16 DIAGNOSIS — Z7151 Drug abuse counseling and surveillance of drug abuser: Secondary | ICD-10-CM | POA: Diagnosis not present

## 2019-02-18 DIAGNOSIS — F1120 Opioid dependence, uncomplicated: Secondary | ICD-10-CM | POA: Diagnosis not present

## 2019-02-18 DIAGNOSIS — Z7151 Drug abuse counseling and surveillance of drug abuser: Secondary | ICD-10-CM | POA: Diagnosis not present

## 2019-02-23 DIAGNOSIS — F1120 Opioid dependence, uncomplicated: Secondary | ICD-10-CM | POA: Diagnosis not present

## 2019-02-23 DIAGNOSIS — Z7151 Drug abuse counseling and surveillance of drug abuser: Secondary | ICD-10-CM | POA: Diagnosis not present

## 2019-02-25 DIAGNOSIS — F1120 Opioid dependence, uncomplicated: Secondary | ICD-10-CM | POA: Diagnosis not present

## 2019-02-25 DIAGNOSIS — Z7151 Drug abuse counseling and surveillance of drug abuser: Secondary | ICD-10-CM | POA: Diagnosis not present

## 2019-03-02 DIAGNOSIS — F1120 Opioid dependence, uncomplicated: Secondary | ICD-10-CM | POA: Diagnosis not present

## 2019-03-02 DIAGNOSIS — Z7151 Drug abuse counseling and surveillance of drug abuser: Secondary | ICD-10-CM | POA: Diagnosis not present

## 2019-03-04 DIAGNOSIS — F1120 Opioid dependence, uncomplicated: Secondary | ICD-10-CM | POA: Diagnosis not present

## 2019-03-04 DIAGNOSIS — Z7151 Drug abuse counseling and surveillance of drug abuser: Secondary | ICD-10-CM | POA: Diagnosis not present

## 2019-03-11 DIAGNOSIS — F1120 Opioid dependence, uncomplicated: Secondary | ICD-10-CM | POA: Diagnosis not present

## 2019-03-11 DIAGNOSIS — Z7151 Drug abuse counseling and surveillance of drug abuser: Secondary | ICD-10-CM | POA: Diagnosis not present

## 2019-03-16 DIAGNOSIS — Z7151 Drug abuse counseling and surveillance of drug abuser: Secondary | ICD-10-CM | POA: Diagnosis not present

## 2019-03-16 DIAGNOSIS — F1120 Opioid dependence, uncomplicated: Secondary | ICD-10-CM | POA: Diagnosis not present

## 2019-03-18 DIAGNOSIS — F1120 Opioid dependence, uncomplicated: Secondary | ICD-10-CM | POA: Diagnosis not present

## 2019-03-18 DIAGNOSIS — Z7151 Drug abuse counseling and surveillance of drug abuser: Secondary | ICD-10-CM | POA: Diagnosis not present

## 2019-03-23 DIAGNOSIS — F1120 Opioid dependence, uncomplicated: Secondary | ICD-10-CM | POA: Diagnosis not present

## 2019-03-23 DIAGNOSIS — Z7151 Drug abuse counseling and surveillance of drug abuser: Secondary | ICD-10-CM | POA: Diagnosis not present

## 2019-03-25 DIAGNOSIS — F1120 Opioid dependence, uncomplicated: Secondary | ICD-10-CM | POA: Diagnosis not present

## 2019-03-25 DIAGNOSIS — Z7151 Drug abuse counseling and surveillance of drug abuser: Secondary | ICD-10-CM | POA: Diagnosis not present

## 2019-03-30 ENCOUNTER — Telehealth: Payer: Self-pay | Admitting: Family Medicine

## 2019-03-30 DIAGNOSIS — Z7151 Drug abuse counseling and surveillance of drug abuser: Secondary | ICD-10-CM | POA: Diagnosis not present

## 2019-03-30 DIAGNOSIS — F1120 Opioid dependence, uncomplicated: Secondary | ICD-10-CM | POA: Diagnosis not present

## 2019-03-30 NOTE — Telephone Encounter (Signed)
Copied from [place] [id number]. Topic: General - Call Back - No Documentation >> Mar 30, 2019 12:54 PM Pauline Good wrote: Reason for CRM: pt returning call back to sched virtual appt for 4.29.20

## 2019-03-31 ENCOUNTER — Other Ambulatory Visit: Payer: Self-pay | Admitting: *Deleted

## 2019-03-31 ENCOUNTER — Telehealth (INDEPENDENT_AMBULATORY_CARE_PROVIDER_SITE_OTHER): Payer: Medicare HMO | Admitting: Family Medicine

## 2019-03-31 ENCOUNTER — Ambulatory Visit: Payer: Medicare HMO | Admitting: Family Medicine

## 2019-03-31 ENCOUNTER — Other Ambulatory Visit: Payer: Self-pay

## 2019-03-31 ENCOUNTER — Encounter: Payer: Self-pay | Admitting: Family Medicine

## 2019-03-31 VITALS — Ht 65.5 in | Wt 112.0 lb

## 2019-03-31 DIAGNOSIS — F17200 Nicotine dependence, unspecified, uncomplicated: Secondary | ICD-10-CM

## 2019-03-31 DIAGNOSIS — R351 Nocturia: Secondary | ICD-10-CM

## 2019-03-31 DIAGNOSIS — Z720 Tobacco use: Secondary | ICD-10-CM | POA: Diagnosis not present

## 2019-03-31 DIAGNOSIS — Z0001 Encounter for general adult medical examination with abnormal findings: Secondary | ICD-10-CM

## 2019-03-31 DIAGNOSIS — N200 Calculus of kidney: Secondary | ICD-10-CM

## 2019-03-31 DIAGNOSIS — Z0000 Encounter for general adult medical examination without abnormal findings: Secondary | ICD-10-CM

## 2019-03-31 DIAGNOSIS — K219 Gastro-esophageal reflux disease without esophagitis: Secondary | ICD-10-CM | POA: Diagnosis not present

## 2019-03-31 DIAGNOSIS — Z1329 Encounter for screening for other suspected endocrine disorder: Secondary | ICD-10-CM

## 2019-03-31 DIAGNOSIS — Z903 Acquired absence of stomach [part of]: Secondary | ICD-10-CM | POA: Diagnosis not present

## 2019-03-31 DIAGNOSIS — Z1321 Encounter for screening for nutritional disorder: Secondary | ICD-10-CM | POA: Diagnosis not present

## 2019-03-31 DIAGNOSIS — K5900 Constipation, unspecified: Secondary | ICD-10-CM

## 2019-03-31 DIAGNOSIS — N401 Enlarged prostate with lower urinary tract symptoms: Secondary | ICD-10-CM

## 2019-03-31 DIAGNOSIS — Z1211 Encounter for screening for malignant neoplasm of colon: Secondary | ICD-10-CM | POA: Diagnosis not present

## 2019-03-31 MED ORDER — VARENICLINE TARTRATE 0.5 MG X 11 & 1 MG X 42 PO MISC: ORAL | 0 refills | Status: DC

## 2019-03-31 MED ORDER — DOCUSATE SODIUM 100 MG PO CAPS: 100.0000 mg | ORAL_CAPSULE | Freq: Two times a day (BID) | ORAL | 0 refills | Status: DC

## 2019-03-31 MED ORDER — POLYETHYLENE GLYCOL 3350 17 GM/SCOOP PO POWD: 17.0000 g | Freq: Two times a day (BID) | ORAL | 1 refills | Status: DC | PRN
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
## 2019-03-31 NOTE — Progress Notes (Signed)
Virtual Visit Note  I connected with patient on 03/31/19 at 1001 by phone and verified that I am speaking with the correct person using two identifiers. Jerry Knight is currently located at home and patient is currently with them during visit. The provider, Rutherford Guys, MD is located in their office at time of visit.  I discussed the limitations, risks, security and privacy concerns of performing an evaluation and management service by telephone and the availability of in person appointments. I also discussed with the patient that there may be a patient responsible charge related to this service. The patient expressed understanding and agreed to proceed.   CC: CPE/multiple concerns  HPI   Colorectal Cancer Screening: colonoscopy, about 15 years ago, denies any changes in bowels, long standing h/o constipation, made worse by TUMS, severe reflux, takes prilosec high dose. History of partial gastrectomy and small intestine surgery.   Prostate Cancer Screening: grandfather died of prostate cancer, nocturia 4-6 times a night for 2-3 years, dribbling, frequent urination, denies any hematuria  HIV Screening: denies any risk factors, reports negative tests  Seasonal Influenza Vaccination: advise seasonal vaccine Td/Tdap Vaccination: due  Pneumococcal Vaccination: at age 65 Zoster Vaccination: at pharmacy of choice Frequency of Dental evaluation: dentures, dental care at [facility] Frequency of Eye evaluation: no, does not wear eyeglasses ? Smokes: 2ppd since age 86, has never been screened Interested in [employer], interested in chantix Has looked into [place] classes  Allergies  Allergen Reactions  . Ambien [Zolpidem Tartrate] Other (See Comments)    Black out  . Iohexol     Pt claims he has chest tightness and sob after iv contrast injections.  . Seroquel [Quetiapine Fumarate]     Blackout  . Sonata [Zaleplon]     "black out'  confused    Prior to Admission  medications   Medication Sig Start Date End Date Taking? Authorizing Provider  buprenorphine-naloxone (SUBOXONE) 8-2 mg SUBL SL tablet Place 1 tablet under the tongue daily.   Yes [provider]  carisoprodol (SOMA) 350 MG tablet TK 1 T PO QHS 01/14/19  Yes [provider]  colchicine 0.6 MG tablet  01/28/19  Yes [provider]  cyclobenzaprine (FLEXERIL) 10 MG tablet TK 1 T PO Q 8 H 12/31/18  Yes [provider]  mirtazapine (REMERON) 45 MG tablet Take 45 mg by mouth at bedtime.   Yes [provider]  omeprazole (PRILOSEC) 40 MG capsule Take 1 capsule (40 mg total) by mouth 2 (two) times daily before a meal. 07/14/17  Yes Waynetta Pean, PA-C  traZODone (DESYREL) 50 MG tablet Take 100 mg by mouth at bedtime.    Yes [provider]    Past Medical History:  Diagnosis Date  . Blood transfusion without reported diagnosis   . Chest pain   . Chronic neck pain   . Chronic pain   . Chronic pancreatitis ([place])   . COPD (chronic obstructive pulmonary disease) ([place])   . Degenerative disk disease    C 5 &C 6  . Depression   . GI bleed   . Gout   . Migraine   . Migraine headache   . Pain management contract agreement   . Seizure disorder ([place])    x 7 years  . Seizures ([place])   . Substance abuse ([place])    Has been cleaned for 18 mos  . Zollinger-Ellison syndrome     Past Surgical History:  Procedure Laterality Date  .  APPENDECTOMY     per CT 2012- s/p appendectomy  . CHOLECYSTECTOMY    . EYE SURGERY    . HERNIA REPAIR    . PARTIAL GASTRECTOMY    . SMALL INTESTINE SURGERY      Social History   Tobacco Use  . Smoking status: Current Every Day Smoker    Packs/day: 2.00    Types: Cigarettes  . Smokeless tobacco: Never Used  . Tobacco comment: about 2 packs a day  Substance Use Topics  . Alcohol use: No    Family History  Problem Relation Age of Onset  . Angina Mother   . Nephrolithiasis Father   . Cancer Brother      Review of Systems  Constitutional: Negative for chills and fever.  Respiratory: Negative for cough and shortness of breath.   Cardiovascular: Negative for chest pain, palpitations and leg swelling.  Gastrointestinal: Positive for constipation, heartburn and nausea. Negative for abdominal pain, blood in stool, melena and vomiting.  Genitourinary: Positive for frequency and urgency. Negative for dysuria and hematuria.  All other systems reviewed and are negative.    Objective  Vitals as reported by the patient: none   ASSESSMENT and PLAN  1. Annual physical exam Routine HCM labs ordered. HCM reviewed/discussed. Anticipatory guidance regarding healthy weight, lifestyle and choices given.   2. Tobacco use disorder Smoking cessation instruction/counseling given > 10 minutes:  counseled patient on the dangers of tobacco use, advised patient to stop smoking, and reviewed strategies to maximize success  Reviewed new medication r/se/b - Comprehensive metabolic panel; Future - Lipid panel; Future  3. BPH associated with nocturia Consider starting meds at next visit if normal labs - PSA; Future - Comprehensive metabolic panel; Future  4. Encounter for screening for nutritional disorder - CBC; Future - Vitamin D, 25-hydroxy; Future - Vitamin B12; Future - Ferritin; Future  5. H/O laparoscopic partial gastrectomy - Ambulatory referral to Gastroenterology - CBC; Future - Vitamin D, 25-hydroxy; Future - Vitamin B12; Future - Ferritin; Future  6. Screening for colon cancer - Ambulatory referral to Gastroenterology  7. Gastroesophageal reflux disease, esophagitis presence not specified - Ambulatory referral to Gastroenterology  8. Screening for thyroid disorder - TSH; Future  9. Constipation, unspecified constipation type Discussed new meds r/se/b Other orders - polyethylene glycol powder (GLYCOLAX/MIRALAX) 17 GM/SCOOP powder; Take 17 g by mouth 2 (two) times daily as needed.  - docusate sodium (COLACE) 100 MG capsule; Take 1 capsule (100 mg total) by mouth 2 (two) times daily.  FOLLOW-UP:  4 weeks, smoking and labs   The above assessment and management plan was discussed with the patient. The patient verbalized understanding of and has agreed to the management plan. Patient is aware to call the clinic if symptoms persist or worsen. Patient is aware when to return to the clinic for a follow-up visit. Patient educated on when it is appropriate to go to the emergency department.    I provided 30 minutes of non-face-to-face time during this encounter.  Rutherford Guys, MD Primary Care at [place], [place] [address] Ph.  [phone] Fax [phone]

## 2019-03-31 NOTE — Progress Notes (Signed)
Screening low dose CT for lung cancer ordered as patient > 58yo, active smoker with > 30 year pack history

## 2019-03-31 NOTE — Patient Instructions (Signed)
For constipation   Make sure you are drinking enough water daily. Make sure you are getting enough fiber in your diet - this will make you regular - you can eat high fiber foods or use metamucil as a supplement - it is really important to drink enough water when using fiber supplements.  If your stools are hard or are formed balls or you have to strain a stool softener will help - use colace 2-3 capsule a day  For gentle treatment of constipation Use Miralax 1-2 capfuls a day until your stools are soft and regular and then decrease the usage - you can use this daily  For more aggressive treatment of constipation Use 4 capfuls of Colace and 6 doses of Miralax and drink it in 2 hours - this should result in several watery stools - if it does not repeat the next day and then go to daily miralax for a week to make sure your bowels are clean and retrained to work properly  For the most aggressive treatment of constipation Use 14 capfuls of Miralax in 1 gallon of fluid (gatoraid or water work well or a combination of the two) and drink over 12h - it is ok to eat during this time and then use Miralax 1 capful daily for about 2 weeks to prevent the constipation from returning

## 2019-03-31 NOTE — Progress Notes (Signed)
Pt c/o CPE and referral top GI.

## 2019-03-31 NOTE — Patient Instructions (Signed)
Thank you for taking time to come for your Medicare Wellness Visit. I appreciate your ongoing commitment to your health goals. Please review the following plan we discussed and let me know if I can assist you in the future.  Leroy Kennedy LPN  Coping with Quitting Smoking  Quitting smoking is a physical and mental challenge. You will face cravings, withdrawal symptoms, and temptation. Before quitting, work with your health care provider to make a plan that can help you cope. Preparation can help you quit and keep you from giving in. How can I cope with cravings? Cravings usually last for 5-10 minutes. If you get through it, the craving will pass. Consider taking the following actions to help you cope with cravings:  Keep your mouth busy: ? Chew sugar-free gum. ? Suck on hard candies or a straw. ? Brush your teeth.  Keep your hands and body busy: ? Immediately change to a different activity when you feel a craving. ? Squeeze or play with a ball. ? Do an activity or a hobby, like making bead jewelry, practicing needlepoint, or working with wood. ? Mix up your normal routine. ? Take a short exercise break. Go for a quick walk or run up and down stairs. ? Spend time in public places where smoking is not allowed.  Focus on doing something kind or helpful for someone else.  Call a friend or family member to talk during a craving.  Join a support group.  Call a quit line, such as 1-800-QUIT-NOW.  Talk with your health care provider about medicines that might help you cope with cravings and make quitting easier for you. How can I deal with withdrawal symptoms? Your body may experience negative effects as it tries to get used to not having nicotine in the system. These effects are called withdrawal symptoms. They may include:  Feeling hungrier than normal.  Trouble concentrating.  Irritability.  Trouble sleeping.  Feeling depressed.  Restlessness and agitation.  Craving a  cigarette. To manage withdrawal symptoms:  Avoid places, people, and activities that trigger your cravings.  Remember why you want to quit.  Get plenty of sleep.  Avoid coffee and other caffeinated drinks. These may worsen some of your symptoms. How can I handle social situations? Social situations can be difficult when you are quitting smoking, especially in the first few weeks. To manage this, you can:  Avoid parties, bars, and other social situations where people might be smoking.  Avoid alcohol.  Leave right away if you have the urge to smoke.  Explain to your family and friends that you are quitting smoking. Ask for understanding and support.  Plan activities with friends or family where smoking is not an option. What are some ways I can cope with stress? Wanting to smoke may cause stress, and stress can make you want to smoke. Find ways to manage your stress. Relaxation techniques can help. For example:  Breathe slowly and deeply, in through your nose and out through your mouth.  Listen to soothing, relaxing music.  Talk with a family member or friend about your stress.  Light a candle.  Soak in a bath or take a shower.  Think about a peaceful place. What are some ways I can prevent weight gain? Be aware that many people gain weight after they quit smoking. However, not everyone does. To keep from gaining weight, have a plan in place before you quit and stick to the plan after you quit. Your plan should include:  Having healthy snacks. When you have a craving, it may help to: ? Eat plain popcorn, crunchy carrots, celery, or other cut vegetables. ? Chew sugar-free gum.  Changing how you eat: ? Eat small portion sizes at meals. ? Eat 4-6 small meals throughout the day instead of 1-2 large meals a day. ? Be mindful when you eat. Do not watch television or do other things that might distract you as you eat.  Exercising regularly: ? Make time to exercise each day. If  you do not have time for a long workout, do short bouts of exercise for 5-10 minutes several times a day. ? Do some form of strengthening exercise, like weight lifting, and some form of aerobic exercise, like running or swimming.  Drinking plenty of water or other low-calorie or no-calorie drinks. Drink 6-8 glasses of water daily, or as much as instructed by your health care provider. Summary  Quitting smoking is a physical and mental challenge. You will face cravings, withdrawal symptoms, and temptation to smoke again. Preparation can help you as you go through these challenges.  You can cope with cravings by keeping your mouth busy (such as by chewing gum), keeping your body and hands busy, and making calls to family, friends, or a helpline for people who want to quit smoking.  You can cope with withdrawal symptoms by avoiding places where people smoke, avoiding drinks with caffeine, and getting plenty of rest.  Ask your health care provider about the different ways to prevent weight gain, avoid stress, and handle social situations. This information is not intended to replace advice given to you by your health care provider. Make sure you discuss any questions you have with your health care provider. Document Released: 11/15/2016 Document Revised: 11/15/2016 Document Reviewed: 11/15/2016 Elsevier Interactive Patient Education  2019 [place] Following a healthy eating pattern may help you to achieve and maintain a healthy body weight, reduce the risk of chronic disease, and live a long and productive life. It is important to follow a healthy eating pattern at an appropriate calorie level for your body. Your nutritional needs should be met primarily through food by choosing a variety of nutrient-rich foods. What are tips for following this plan? Reading food labels  Read labels and choose the following: ? Reduced or low sodium. ? Juices with 100% fruit juice. ? Foods with  low saturated fats and high polyunsaturated and monounsaturated fats. ? Foods with whole grains, such as whole wheat, cracked wheat, brown rice, and wild rice. ? Whole grains that are fortified with folic acid. This is recommended for women who are pregnant or who want to become pregnant.  Read labels and avoid the following: ? Foods with a lot of added sugars. These include foods that contain brown sugar, corn sweetener, corn syrup, dextrose, fructose, glucose, high-fructose corn syrup, honey, invert sugar, lactose, malt syrup, maltose, molasses, raw sugar, sucrose, trehalose, or turbinado sugar.  Do not eat more than the following amounts of added sugar per day:  6 teaspoons (25 g) for women.  9 teaspoons (38 g) for men. ? Foods that contain processed or refined starches and grains. ? Refined grain products, such as white flour, degermed cornmeal, white bread, and white rice. Shopping  Choose nutrient-rich snacks, such as vegetables, whole fruits, and nuts. Avoid high-calorie and high-sugar snacks, such as potato chips, fruit snacks, and candy.  Use oil-based dressings and spreads on foods instead of solid fats such as butter, stick margarine, or cream  cheese.  Limit pre-made sauces, mixes, and "instant" products such as flavored rice, instant noodles, and ready-made pasta.  Try more plant-protein sources, such as tofu, tempeh, black beans, edamame, lentils, nuts, and seeds.  Explore eating plans such as the Mediterranean diet or vegetarian diet. Cooking  Use oil to saut or stir-fry foods instead of solid fats such as butter, stick margarine, or lard.  Try baking, boiling, grilling, or broiling instead of frying.  Remove the fatty part of meats before cooking.  Steam vegetables in water or broth. Meal planning   At meals, imagine dividing your plate into fourths: ? One-half of your plate is fruits and vegetables. ? One-fourth of your plate is whole grains. ? One-fourth of  your plate is protein, especially lean meats, poultry, eggs, tofu, beans, or nuts.  Include low-fat dairy as part of your daily diet. Lifestyle  Choose healthy options in all settings, including home, work, school, restaurants, or stores.  Prepare your food safely: ? Wash your hands after handling raw meats. ? Keep food preparation surfaces clean by regularly washing with hot, soapy water. ? Keep raw meats separate from ready-to-eat foods, such as fruits and vegetables. ? Cook seafood, meat, poultry, and eggs to the recommended internal temperature. ? Store foods at safe temperatures. In general:  Keep cold foods at 10F (4.4C) or below.  Keep hot foods at 110F (60C) or above.  Keep your freezer at [facility] (-17.8C) or below.  Foods are no longer safe to eat when they have been between the temperatures of 40-110F (4.4-60C) for more than 2 hours. What foods should I eat? Fruits Aim to eat 2 cup-equivalents of fresh, canned (in natural juice), or frozen fruits each day. Examples of 1 cup-equivalent of fruit include 1 small apple, 8 large strawberries, 1 cup canned fruit,  cup dried fruit, or 1 cup 100% juice. Vegetables Aim to eat 2-3 cup-equivalents of fresh and frozen vegetables each day, including different varieties and colors. Examples of 1 cup-equivalent of vegetables include 2 medium carrots, 2 cups raw, leafy greens, 1 cup chopped vegetable (raw or cooked), or 1 medium baked potato. Grains Aim to eat 6 ounce-equivalents of whole grains each day. Examples of 1 ounce-equivalent of grains include 1 slice of bread, 1 cup ready-to-eat cereal, 3 cups popcorn, or  cup cooked rice, pasta, or cereal. Meats and other proteins Aim to eat 5-6 ounce-equivalents of protein each day. Examples of 1 ounce-equivalent of protein include 1 egg, 1/2 cup nuts or seeds, or 1 tablespoon (16 g) peanut butter. A cut of meat or fish that is the size of a deck of cards is about 3-4 ounce-equivalents.   Of the protein you eat each week, try to have at least 8 ounces come from seafood. This includes salmon, trout, herring, and anchovies. Dairy Aim to eat 3 cup-equivalents of fat-free or low-fat dairy each day. Examples of 1 cup-equivalent of dairy include 1 cup (240 mL) milk, 8 ounces (250 g) yogurt, 1 ounces (44 g) natural cheese, or 1 cup (240 mL) fortified soy milk. Fats and oils  Aim for about 5 teaspoons (21 g) per day. Choose monounsaturated fats, such as canola and olive oils, avocados, peanut butter, and most nuts, or polyunsaturated fats, such as sunflower, corn, and soybean oils, walnuts, pine nuts, sesame seeds, sunflower seeds, and flaxseed. Beverages  Aim for six 8-oz glasses of water per day. Limit coffee to three to five 8-oz cups per day.  Limit caffeinated beverages that have added  calories, such as soda and energy drinks.  Limit alcohol intake to no more than 1 drink a day for nonpregnant women and 2 drinks a day for men. One drink equals 12 oz of beer (355 mL), 5 oz of wine (148 mL), or 1 oz of hard liquor (44 mL). Seasoning and other foods  Avoid adding excess amounts of salt to your foods. Try flavoring foods with herbs and spices instead of salt.  Avoid adding sugar to foods.  Try using oil-based dressings, sauces, and spreads instead of solid fats. This information is based on general [place] nutrition guidelines. For more information, visit [employer]. Exact amounts may vary based on your nutrition needs. Summary  A healthy eating plan may help you to maintain a healthy weight, reduce the risk of chronic diseases, and stay active throughout your life.  Plan your meals. Make sure you eat the right portions of a variety of nutrient-rich foods.  Try baking, boiling, grilling, or broiling instead of frying.  Choose healthy options in all settings, including home, work, school, restaurants, or stores. This information is not intended to replace advice given to  you by your health care provider. Make sure you discuss any questions you have with your health care provider. Document Released: 03/02/2018 Document Revised: 03/02/2018 Document Reviewed: 03/02/2018 Elsevier Interactive Patient Education  2019 [employer].

## 2019-03-31 NOTE — Addendum Note (Signed)
Addended by: Rutherford Guys on: 03/31/2019 10:51 AM   Modules accepted: Orders

## 2019-04-01 DIAGNOSIS — Z7151 Drug abuse counseling and surveillance of drug abuser: Secondary | ICD-10-CM | POA: Diagnosis not present

## 2019-04-01 DIAGNOSIS — F1120 Opioid dependence, uncomplicated: Secondary | ICD-10-CM | POA: Diagnosis not present

## 2019-04-02 ENCOUNTER — Other Ambulatory Visit: Payer: Self-pay

## 2019-04-02 ENCOUNTER — Ambulatory Visit (INDEPENDENT_AMBULATORY_CARE_PROVIDER_SITE_OTHER): Payer: Medicare HMO | Admitting: Family Medicine

## 2019-04-02 DIAGNOSIS — E559 Vitamin D deficiency, unspecified: Secondary | ICD-10-CM

## 2019-04-02 DIAGNOSIS — Z1329 Encounter for screening for other suspected endocrine disorder: Secondary | ICD-10-CM

## 2019-04-02 DIAGNOSIS — N401 Enlarged prostate with lower urinary tract symptoms: Secondary | ICD-10-CM

## 2019-04-02 DIAGNOSIS — Z903 Acquired absence of stomach [part of]: Secondary | ICD-10-CM | POA: Diagnosis not present

## 2019-04-02 DIAGNOSIS — F17200 Nicotine dependence, unspecified, uncomplicated: Secondary | ICD-10-CM | POA: Diagnosis not present

## 2019-04-02 DIAGNOSIS — R351 Nocturia: Secondary | ICD-10-CM

## 2019-04-02 DIAGNOSIS — E7800 Pure hypercholesterolemia, unspecified: Secondary | ICD-10-CM | POA: Diagnosis not present

## 2019-04-02 DIAGNOSIS — Z1321 Encounter for screening for nutritional disorder: Secondary | ICD-10-CM

## 2019-04-02 LAB — URINALYSIS, ROUTINE W REFLEX MICROSCOPIC
Bilirubin, UA: NEGATIVE
Glucose, UA: NEGATIVE
Ketones, UA: NEGATIVE
Leukocytes,UA: NEGATIVE
Nitrite, UA: NEGATIVE
Protein,UA: NEGATIVE
RBC, UA: NEGATIVE
Specific Gravity, UA: 1.011 (ref 1.005–1.030)
Urobilinogen, Ur: 0.2 mg/dL (ref 0.2–1.0)
pH, UA: 7.5 (ref 5.0–7.5)

## 2019-04-02 NOTE — Progress Notes (Signed)
Labs only

## 2019-04-05 LAB — CBC
Hematocrit: 45.3 % (ref 37.5–51.0)
Hemoglobin: 15.1 g/dL (ref 13.0–17.7)
MCH: 30 pg (ref 26.6–33.0)
MCHC: 33.3 g/dL (ref 31.5–35.7)
MCV: 90 fL (ref 79–97)
Platelets: 261 10*3/uL (ref 150–450)
RBC: 5.03 x10E6/uL (ref 4.14–5.80)
RDW: 12.5 % (ref 11.6–15.4)
WBC: 6.7 10*3/uL (ref 3.4–10.8)

## 2019-04-05 LAB — COMPREHENSIVE METABOLIC PANEL
ALT: 16 IU/L (ref 0–44)
AST: 18 IU/L (ref 0–40)
Albumin/Globulin Ratio: 1.3 (ref 1.2–2.2)
Albumin: 4.5 g/dL (ref 3.8–4.9)
Alkaline Phosphatase: 119 IU/L — ABNORMAL HIGH (ref 39–117)
BUN/Creatinine Ratio: 8 — ABNORMAL LOW (ref 9–20)
BUN: 7 mg/dL (ref 6–24)
Bilirubin Total: 0.6 mg/dL (ref 0.0–1.2)
CO2: 21 mmol/L (ref 20–29)
Calcium: 10.5 mg/dL — ABNORMAL HIGH (ref 8.7–10.2)
Chloride: 97 mmol/L (ref 96–106)
Creatinine, Ser: 0.83 mg/dL (ref 0.76–1.27)
GFR calc Af Amer: 114 mL/min/{1.73_m2} (ref >59)
GFR calc non Af Amer: 98 mL/min/{1.73_m2} (ref >59)
Globulin, Total: 3.5 g/dL (ref 1.5–4.5)
Glucose: 100 mg/dL — ABNORMAL HIGH (ref 65–99)
Potassium: 4.5 mmol/L (ref 3.5–5.2)
Sodium: 136 mmol/L (ref 134–144)
Total Protein: 8 g/dL (ref 6.0–8.5)

## 2019-04-05 LAB — LIPID PANEL
Chol/HDL Ratio: 5.7 ratio — ABNORMAL HIGH (ref 0.0–5.0)
Cholesterol, Total: 204 mg/dL — ABNORMAL HIGH (ref 100–199)
HDL: 36 mg/dL — ABNORMAL LOW (ref >39)
LDL Calculated: 143 mg/dL — ABNORMAL HIGH (ref 0–99)
Triglycerides: 123 mg/dL (ref 0–149)
VLDL Cholesterol Cal: 25 mg/dL (ref 5–40)

## 2019-04-05 LAB — TSH: TSH: 2.09 u[IU]/mL (ref 0.450–4.500)

## 2019-04-05 LAB — PSA: Prostate Specific Ag, Serum: 0.8 ng/mL (ref 0.0–4.0)

## 2019-04-05 LAB — VITAMIN B12: Vitamin B-12: 507 pg/mL (ref 232–1245)

## 2019-04-05 LAB — FERRITIN: Ferritin: 113 ng/mL (ref 30–400)

## 2019-04-05 LAB — VITAMIN D 25 HYDROXY (VIT D DEFICIENCY, FRACTURES): Vit D, 25-Hydroxy: 11.9 ng/mL — ABNORMAL LOW (ref 30.0–100.0)

## 2019-04-06 DIAGNOSIS — Z7151 Drug abuse counseling and surveillance of drug abuser: Secondary | ICD-10-CM | POA: Diagnosis not present

## 2019-04-06 DIAGNOSIS — F1120 Opioid dependence, uncomplicated: Secondary | ICD-10-CM | POA: Diagnosis not present

## 2019-04-08 DIAGNOSIS — F1120 Opioid dependence, uncomplicated: Secondary | ICD-10-CM | POA: Diagnosis not present

## 2019-04-12 DIAGNOSIS — F1120 Opioid dependence, uncomplicated: Secondary | ICD-10-CM | POA: Diagnosis not present

## 2019-04-12 DIAGNOSIS — G4700 Insomnia, unspecified: Secondary | ICD-10-CM | POA: Diagnosis not present

## 2019-04-12 MED ORDER — ATORVASTATIN CALCIUM 20 MG PO TABS: 20.0000 mg | ORAL_TABLET | Freq: Every day | ORAL | 3 refills | Status: DC

## 2019-04-12 MED ORDER — VITAMIN D (ERGOCALCIFEROL) 1.25 MG (50000 UNIT) PO CAPS: 50000.0000 [IU] | ORAL_CAPSULE | ORAL | 0 refills | Status: DC

## 2019-04-12 NOTE — Addendum Note (Signed)
Addended by: Rutherford Guys on: 04/12/2019 11:31 AM   Modules accepted: Orders

## 2019-04-13 DIAGNOSIS — Z7151 Drug abuse counseling and surveillance of drug abuser: Secondary | ICD-10-CM | POA: Diagnosis not present

## 2019-04-13 DIAGNOSIS — F1120 Opioid dependence, uncomplicated: Secondary | ICD-10-CM | POA: Diagnosis not present

## 2019-04-15 DIAGNOSIS — F1120 Opioid dependence, uncomplicated: Secondary | ICD-10-CM | POA: Diagnosis not present

## 2019-04-15 DIAGNOSIS — Z7151 Drug abuse counseling and surveillance of drug abuser: Secondary | ICD-10-CM | POA: Diagnosis not present

## 2019-04-19 ENCOUNTER — Ambulatory Visit
Admission: RE | Admit: 2019-04-19 | Discharge: 2019-04-19 | Disposition: A | Discharge status: Home / Self Care | Payer: Medicare HMO | Source: Ambulatory Visit | Attending: Family Medicine | Admitting: Family Medicine

## 2019-04-19 ENCOUNTER — Other Ambulatory Visit: Payer: Self-pay

## 2019-04-19 DIAGNOSIS — Z87891 Personal history of nicotine dependence: Secondary | ICD-10-CM | POA: Diagnosis not present

## 2019-04-19 DIAGNOSIS — F17200 Nicotine dependence, unspecified, uncomplicated: Secondary | ICD-10-CM | POA: Diagnosis not present

## 2019-04-19 DIAGNOSIS — Z720 Tobacco use: Secondary | ICD-10-CM

## 2019-04-19 DIAGNOSIS — Z136 Encounter for screening for cardiovascular disorders: Secondary | ICD-10-CM | POA: Diagnosis not present

## 2019-04-20 DIAGNOSIS — Z7151 Drug abuse counseling and surveillance of drug abuser: Secondary | ICD-10-CM | POA: Diagnosis not present

## 2019-04-20 DIAGNOSIS — F1120 Opioid dependence, uncomplicated: Secondary | ICD-10-CM | POA: Diagnosis not present

## 2019-04-21 NOTE — Addendum Note (Signed)
Addended by: Rutherford Guys on: 04/21/2019 12:55 PM   Modules accepted: Orders

## 2019-04-22 DIAGNOSIS — F1120 Opioid dependence, uncomplicated: Secondary | ICD-10-CM | POA: Diagnosis not present

## 2019-04-27 ENCOUNTER — Ambulatory Visit: Payer: Self-pay

## 2019-04-28 ENCOUNTER — Other Ambulatory Visit: Payer: Self-pay

## 2019-04-28 ENCOUNTER — Ambulatory Visit (INDEPENDENT_AMBULATORY_CARE_PROVIDER_SITE_OTHER): Payer: Medicare HMO | Admitting: Family Medicine

## 2019-04-28 DIAGNOSIS — E559 Vitamin D deficiency, unspecified: Secondary | ICD-10-CM

## 2019-04-28 DIAGNOSIS — E7800 Pure hypercholesterolemia, unspecified: Secondary | ICD-10-CM | POA: Diagnosis not present

## 2019-04-29 DIAGNOSIS — F1120 Opioid dependence, uncomplicated: Secondary | ICD-10-CM | POA: Diagnosis not present

## 2019-04-29 LAB — COMPREHENSIVE METABOLIC PANEL
ALT: 15 IU/L (ref 0–44)
AST: 19 IU/L (ref 0–40)
Albumin/Globulin Ratio: 1.3 (ref 1.2–2.2)
Albumin: 4.7 g/dL (ref 3.8–4.9)
Alkaline Phosphatase: 141 IU/L — ABNORMAL HIGH (ref 39–117)
BUN/Creatinine Ratio: 12 (ref 9–20)
BUN: 9 mg/dL (ref 6–24)
Bilirubin Total: 0.4 mg/dL (ref 0.0–1.2)
CO2: 22 mmol/L (ref 20–29)
Calcium: 9.9 mg/dL (ref 8.7–10.2)
Chloride: 97 mmol/L (ref 96–106)
Creatinine, Ser: 0.75 mg/dL — ABNORMAL LOW (ref 0.76–1.27)
GFR calc Af Amer: 119 mL/min/{1.73_m2} (ref >59)
GFR calc non Af Amer: 103 mL/min/{1.73_m2} (ref >59)
Globulin, Total: 3.5 g/dL (ref 1.5–4.5)
Glucose: 89 mg/dL (ref 65–99)
Potassium: 4.1 mmol/L (ref 3.5–5.2)
Sodium: 138 mmol/L (ref 134–144)
Total Protein: 8.2 g/dL (ref 6.0–8.5)

## 2019-04-29 LAB — PTH, INTACT AND CALCIUM: PTH: 22 pg/mL (ref 15–65)

## 2019-04-29 LAB — LIPID PANEL
Chol/HDL Ratio: 4 ratio (ref 0.0–5.0)
Cholesterol, Total: 153 mg/dL (ref 100–199)
HDL: 38 mg/dL — ABNORMAL LOW (ref >39)
LDL Calculated: 104 mg/dL — ABNORMAL HIGH (ref 0–99)
Triglycerides: 57 mg/dL (ref 0–149)
VLDL Cholesterol Cal: 11 mg/dL (ref 5–40)

## 2019-04-29 LAB — VITAMIN D 25 HYDROXY (VIT D DEFICIENCY, FRACTURES): Vit D, 25-Hydroxy: 14.5 ng/mL — ABNORMAL LOW (ref 30.0–100.0)

## 2019-05-04 DIAGNOSIS — Z7151 Drug abuse counseling and surveillance of drug abuser: Secondary | ICD-10-CM | POA: Diagnosis not present

## 2019-05-04 DIAGNOSIS — F1120 Opioid dependence, uncomplicated: Secondary | ICD-10-CM | POA: Diagnosis not present

## 2019-05-07 ENCOUNTER — Other Ambulatory Visit: Payer: Self-pay | Admitting: Family Medicine

## 2019-05-07 MED ORDER — VITAMIN D (ERGOCALCIFEROL) 1.25 MG (50000 UNIT) PO CAPS: 50000.0000 [IU] | ORAL_CAPSULE | ORAL | 0 refills | Status: DC

## 2019-05-11 DIAGNOSIS — Z7151 Drug abuse counseling and surveillance of drug abuser: Secondary | ICD-10-CM | POA: Diagnosis not present

## 2019-05-11 DIAGNOSIS — F1120 Opioid dependence, uncomplicated: Secondary | ICD-10-CM | POA: Diagnosis not present

## 2019-05-13 DIAGNOSIS — F1120 Opioid dependence, uncomplicated: Secondary | ICD-10-CM | POA: Diagnosis not present

## 2019-05-18 DIAGNOSIS — Z7151 Drug abuse counseling and surveillance of drug abuser: Secondary | ICD-10-CM | POA: Diagnosis not present

## 2019-05-18 DIAGNOSIS — F1120 Opioid dependence, uncomplicated: Secondary | ICD-10-CM | POA: Diagnosis not present

## 2019-05-20 ENCOUNTER — Telehealth: Payer: Self-pay | Admitting: Family Medicine

## 2019-05-20 DIAGNOSIS — F1120 Opioid dependence, uncomplicated: Secondary | ICD-10-CM | POA: Diagnosis not present

## 2019-05-20 DIAGNOSIS — G4700 Insomnia, unspecified: Secondary | ICD-10-CM | POA: Diagnosis not present

## 2019-05-20 MED ORDER — COLCHICINE 0.6 MG PO TABS: 0.6000 mg | ORAL_TABLET | Freq: Two times a day (BID) | ORAL | 0 refills | Status: DC | PRN

## 2019-05-20 NOTE — Telephone Encounter (Signed)
Pt had a visit with dr Pamella Pert on 04-28-2019 and he forgot to mention to doctor that he does have gout. Pt would like new rx colchicine 0.6 mg from dr Pamella Pert. Pt has gotten the med from his previous md. [facility]

## 2019-05-20 NOTE — Telephone Encounter (Signed)
Is it ok to sent this rx or should he come into the office for labs?

## 2019-05-25 DIAGNOSIS — Z7151 Drug abuse counseling and surveillance of drug abuser: Secondary | ICD-10-CM | POA: Diagnosis not present

## 2019-05-25 DIAGNOSIS — F1120 Opioid dependence, uncomplicated: Secondary | ICD-10-CM | POA: Diagnosis not present

## 2019-05-27 ENCOUNTER — Encounter: Payer: Self-pay | Admitting: Family Medicine

## 2019-05-27 ENCOUNTER — Ambulatory Visit (INDEPENDENT_AMBULATORY_CARE_PROVIDER_SITE_OTHER): Payer: Medicare HMO | Admitting: Family Medicine

## 2019-05-27 ENCOUNTER — Other Ambulatory Visit: Payer: Self-pay

## 2019-05-27 VITALS — BP 103/72 | HR 89 | Temp 98.3°F | Ht 65.5 in | Wt 109.4 lb

## 2019-05-27 DIAGNOSIS — M25521 Pain in right elbow: Secondary | ICD-10-CM

## 2019-05-27 DIAGNOSIS — E559 Vitamin D deficiency, unspecified: Secondary | ICD-10-CM

## 2019-05-27 DIAGNOSIS — F1120 Opioid dependence, uncomplicated: Secondary | ICD-10-CM | POA: Diagnosis not present

## 2019-05-27 DIAGNOSIS — M25522 Pain in left elbow: Secondary | ICD-10-CM

## 2019-05-27 DIAGNOSIS — E7800 Pure hypercholesterolemia, unspecified: Secondary | ICD-10-CM

## 2019-05-27 DIAGNOSIS — G4700 Insomnia, unspecified: Secondary | ICD-10-CM | POA: Diagnosis not present

## 2019-05-27 MED ORDER — ATORVASTATIN CALCIUM 10 MG PO TABS: 10.0000 mg | ORAL_TABLET | Freq: Every day | ORAL | 3 refills | Status: DC

## 2019-05-27 MED ORDER — COLCHICINE 0.6 MG PO TABS: 0.6000 mg | ORAL_TABLET | Freq: Two times a day (BID) | ORAL | 0 refills | Status: DC | PRN

## 2019-05-27 NOTE — Progress Notes (Signed)
6/25/202012:06 PM  Jerry Knight 10/08/1961, 58 y.o., male [id number]  Chief Complaint  Patient presents with  . Follow-up    hypercholesteroemia, vit d, thyroid  . Medication Refill    colchicine, says he did not know that he was suppose to be taking the medication.. The chantix he is not ready to start, dad is very sick, very stressful situation    HPI:   Patient is a 58 y.o. male with past medical history significant for chronic pain form multiple abd surgery and addiction, vitamin D deficiency, HLP, gout, tobacco use who presents today for routine followup  Requesting refill of colchicine States that recently he has been having gout flareups for about once a week Always in his arms, starts at elbow, very sore but no swelling, no redness or heat, pain travels down forearm to thumb Colchicine helps Diagnosed by suboxone provider empirically about 3 months ago  Taking OTC vitamin D3 2000 units a day Never started atorvastatin Has upcoming appt with urology and GI Not ready to quit smoking  Very stressed do his fathers recent decline in health. Currently doing better  The 10-year ASCVD risk score Mikey Bussing DC Brooke Bonito., et al., 2013) is: 7.2%   Values used to calculate the score:     Age: 67 years     Sex: Male     Is Non-Hispanic African American: No     Diabetic: No     Tobacco smoker: Yes     Systolic Blood Pressure: 329 mmHg     Is BP treated: No     HDL Cholesterol: 38 mg/dL     Total Cholesterol: 153 mg/dL  Lab Results  Component Value Date   CHOL 153 04/28/2019   HDL 38 (L) 04/28/2019   LDLCALC 104 (H) 04/28/2019   TRIG 57 04/28/2019   CHOLHDL 4.0 04/28/2019   Lab Results  Component Value Date   CREATININE 0.75 (L) 04/28/2019   BUN 9 04/28/2019   NA 138 04/28/2019   K 4.1 04/28/2019   CL 97 04/28/2019   CO2 22 04/28/2019     Depression screen PHQ 2/9 05/27/2019 03/31/2019 03/31/2019  Decreased Interest 0 0 0  Down, Depressed, Hopeless 0 0 0  PHQ - 2  Score 0 0 0    Fall Risk  05/27/2019 03/31/2019 03/31/2019 02/01/2019  Falls in the past year? 0 0 0 0  Number falls in past yr: 0 0 - 0  Injury with Fall? 0 0 - 0  Risk for fall due to : - - - History of fall(s)     Allergies  Allergen Reactions  . Ambien [Zolpidem Tartrate] Other (See Comments)    Black out  . Iohexol     Pt claims he has chest tightness and sob after iv contrast injections.  . Seroquel [Quetiapine Fumarate]     Blackout  . Sonata [Zaleplon]     "black out'  confused    Prior to Admission medications   Medication Sig Start Date End Date Taking? Authorizing Provider  buprenorphine-naloxone (SUBOXONE) 8-2 mg SUBL SL tablet Place 1 tablet under the tongue daily.   Yes [provider]  colchicine 0.6 MG tablet Take 1 tablet (0.6 mg total) by mouth 2 (two) times daily as needed (at onset of gout flare up). 05/20/19  Yes Rutherford Guys, MD  cyclobenzaprine (FLEXERIL) 10 MG tablet TK 1 T PO Q 8 H 12/31/18  Yes [provider]  mirtazapine (REMERON) 45 MG tablet Take  45 mg by mouth at bedtime.   Yes [provider]  traZODone (DESYREL) 50 MG tablet Take 100 mg by mouth at bedtime.    Yes [provider]  Vitamin D, Ergocalciferol, (DRISDOL) 1.25 MG (50000 UT) CAPS capsule Take 1 capsule (50,000 Units total) by mouth every 7 (seven) days. 05/07/19  Yes Rutherford Guys, MD  atorvastatin (LIPITOR) 20 MG tablet Take 1 tablet (20 mg total) by mouth daily. Patient not taking: Reported on 05/27/2019 04/12/19   Rutherford Guys, MD  varenicline (CHANTIX PAK) 0.5 MG X 11 & 1 MG X 42 tablet Take one 0.5 mg tablet by mouth once daily for 3 days, then increase to one 0.5 mg tablet twice daily for 4 days, then increase to one 1 mg tablet twice daily. Patient not taking: Reported on 05/27/2019 03/31/19   Rutherford Guys, MD    Past Medical History:  Diagnosis Date  . Blood transfusion without reported diagnosis   . Chest pain   . Chronic neck pain   .  Chronic pain   . Chronic pancreatitis ([place])   . COPD (chronic obstructive pulmonary disease) ([place])   . Degenerative disk disease    C 5 &C 6  . Depression   . GI bleed   . Gout   . Migraine   . Migraine headache   . Pain management contract agreement   . Seizure disorder ([place])    x 7 years  . Seizures ([place])   . 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 Zollinger-Ellison syndrome     Past Surgical History:  Procedure Laterality Date  . APPENDECTOMY     per CT 2012- s/p appendectomy  . CHOLECYSTECTOMY    . EYE SURGERY    . HERNIA REPAIR    . PARTIAL GASTRECTOMY    . SMALL INTESTINE SURGERY      Social History   Tobacco Use  . Smoking status: Current Every Day Smoker    Packs/day: 2.00    Types: Cigarettes  . Smokeless tobacco: Never Used  . Tobacco comment: about 2 packs a day  Substance Use Topics  . Alcohol use: No    Family History  Problem Relation Age of Onset  . Angina Mother   . Nephrolithiasis Father   . Cancer Brother     Review of Systems  Constitutional: Negative for chills and fever.  Respiratory: Negative for cough and shortness of breath.   Cardiovascular: Negative for chest pain, palpitations and leg swelling.  Gastrointestinal: Negative for abdominal pain, nausea and vomiting.     OBJECTIVE:  Today's Vitals   05/27/19 1122  BP: 103/72  Pulse: 89  Temp: 98.3 F (36.8 C)  TempSrc: Oral  SpO2: 100%  Weight: 109 lb 6.4 oz (49.6 kg)  Height: 5' 5.5" (1.664 m)   Body mass index is 17.93 kg/m.   Physical Exam Vitals signs and nursing note reviewed.  Constitutional:      Appearance: He is well-developed.  HENT:     Head: Normocephalic and atraumatic.  Eyes:     Conjunctiva/sclera: Conjunctivae normal.     Pupils: Pupils are equal, round, and reactive to light.  Neck:     Musculoskeletal: Neck supple.  Cardiovascular:     Rate and Rhythm: Normal rate and regular rhythm.     Heart sounds: No murmur. No friction  rub. No gallop.   Pulmonary:     Effort: Pulmonary effort is normal.  Breath sounds: Normal breath sounds. No wheezing or rales.  Musculoskeletal:     Right elbow: Normal.    Left elbow: Normal.  Skin:    General: Skin is warm and dry.  Neurological:     Mental Status: He is alert and oriented to person, place, and time.     ASSESSMENT and PLAN  1. Arthralgia of both elbows If gout, consider starting allopurinol given increase in frequency. Refilled colchicine as it has been helping. Consider xray, referral if not gout. - Uric Acid  2. Pure hypercholesterolemia Discussed ascvd risk. Start atorvastatin. Recheck labs at next vist.  3. Vitamin D deficiency Continue with OTC supplementation. Recheck labs at next visit.  Other orders - atorvastatin (LIPITOR) 10 MG tablet; Take 1 tablet (10 mg total) by mouth daily. - colchicine 0.6 MG tablet; Take 1 tablet (0.6 mg total) by mouth 2 (two) times daily as needed (at onset of gout flare up).  Return in about 3 months (around 08/27/2019).    Rutherford Guys, MD [facility] [place], [place] [address] Ph.  [phone] Fax [phone]

## 2019-05-27 NOTE — Patient Instructions (Signed)
° ° ° °  If you have lab work done today you will be contacted with your lab results within the next 2 weeks.  If you have not heard from us then please contact us. The fastest way to get your results is to register for My Chart. ° ° °IF you received an x-ray today, you will receive an invoice from [facility]. Please contact [facility] at [phone] with questions or concerns regarding your invoice.  ° °IF you received labwork today, you will receive an invoice from [facility]. Please contact [facility] at [phone] with questions or concerns regarding your invoice.  ° °Our billing staff will not be able to assist you with questions regarding bills from these companies. ° °You will be contacted with the lab results as soon as they are available. The fastest way to get your results is to activate your My Chart account. Instructions are located on the last page of this paperwork. If you have not heard from us regarding the results in 2 weeks, please contact this office. °  ° ° ° °

## 2019-05-28 DIAGNOSIS — R109 Unspecified abdominal pain: Secondary | ICD-10-CM | POA: Diagnosis not present

## 2019-05-28 LAB — URIC ACID: Uric Acid: 5.1 mg/dL (ref 3.7–8.6)

## 2019-05-29 ENCOUNTER — Emergency Department (HOSPITAL_BASED_OUTPATIENT_CLINIC_OR_DEPARTMENT_OTHER)
Admission: EM | Admit: 2019-05-29 | Discharge: 2019-05-29 | Disposition: A | Discharge status: Home / Self Care | Payer: Medicare HMO | Attending: Emergency Medicine | Admitting: Emergency Medicine

## 2019-05-29 ENCOUNTER — Other Ambulatory Visit: Payer: Self-pay

## 2019-05-29 ENCOUNTER — Encounter (HOSPITAL_BASED_OUTPATIENT_CLINIC_OR_DEPARTMENT_OTHER): Payer: Self-pay | Admitting: Emergency Medicine

## 2019-05-29 DIAGNOSIS — F17210 Nicotine dependence, cigarettes, uncomplicated: Secondary | ICD-10-CM | POA: Diagnosis not present

## 2019-05-29 DIAGNOSIS — J449 Chronic obstructive pulmonary disease, unspecified: Secondary | ICD-10-CM | POA: Diagnosis not present

## 2019-05-29 DIAGNOSIS — J029 Acute pharyngitis, unspecified: Secondary | ICD-10-CM | POA: Diagnosis not present

## 2019-05-29 DIAGNOSIS — Z79899 Other long term (current) drug therapy: Secondary | ICD-10-CM | POA: Insufficient documentation

## 2019-05-29 LAB — GROUP A STREP BY PCR: Group A Strep by PCR: NOT DETECTED

## 2019-05-29 NOTE — ED Provider Notes (Signed)
[facility] Provider Note: Georgena Spurling, MD, FACEP  CSN: [id number] MRN: [id number] ARRIVAL: 05/29/19 at 0537 ROOM: [place]  Sore Throat   HISTORY OF PRESENT ILLNESS  05/29/19 5:48 AM Jerry Knight is a 58 y.o. 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  He is here with a sore throat that began yesterday.  The onset has been gradual.  He rates his pain as a 7 out of 10 now, worse with swallowing.  He states it is only on the right side of his throat.  He denies associated fever, chills, nausea, vomiting, or swollen lymph nodes.  He is concerned he has he has a history of peritonsillar abscess in the past.   Past Medical History:  Diagnosis Date  . Blood transfusion without reported diagnosis   . Chest pain   . Chronic neck pain   . Chronic pain   . Chronic pancreatitis ([place])   . COPD (chronic obstructive pulmonary disease) ([place])   . Degenerative disk disease    C 5 &C 6  . Depression   . GI bleed   . Gout   . Migraine   . Migraine headache   . Pain management contract agreement   . Seizure disorder ([place])    x 7 years  . Seizures ([place])   . 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 Zollinger-Ellison syndrome     Past Surgical History:  Procedure Laterality Date  . APPENDECTOMY     per CT 2012- s/p appendectomy  . CHOLECYSTECTOMY    . EYE SURGERY    . HERNIA REPAIR    . PARTIAL GASTRECTOMY    . SMALL INTESTINE SURGERY      Family History  Problem Relation Age of Onset  . Angina Mother   . Nephrolithiasis Father   . Cancer Brother     Social History   Tobacco Use  . Smoking status: Current Every Day Smoker    Packs/day: 2.00    Types: Cigarettes  . Smokeless tobacco: Never Used  . Tobacco comment: about 2 packs a day  Substance Use Topics  . Alcohol use: No  . 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 Authorizing Provider  atorvastatin (LIPITOR) 10 MG tablet Take 1 tablet (10 mg total) by mouth daily. 05/27/19   Rutherford Guys, MD  buprenorphine-naloxone (SUBOXONE) 8-2 mg SUBL SL tablet Place 1 tablet under the tongue daily.    [provider]  colchicine 0.6 MG tablet Take 1 tablet (0.6 mg total) by mouth 2 (two) times daily as needed (at onset of gout flare up). 05/27/19   Rutherford Guys, MD  cyclobenzaprine (FLEXERIL) 10 MG tablet TK 1 T PO Q 8 H 12/31/18   [provider]  mirtazapine (REMERON) 45 MG tablet Take 45 mg by mouth at bedtime.    [provider]  traZODone (DESYREL) 50 MG tablet Take 100 mg by mouth at bedtime.     [provider]  varenicline (CHANTIX PAK) 0.5 MG X 11 & 1 MG X 42 tablet Take one 0.5 mg tablet by mouth once daily for 3 days, then increase to one 0.5 mg tablet twice daily for 4 days, then increase to  one 1 mg tablet twice daily. Patient not taking: Reported on 05/27/2019 03/31/19   Rutherford Guys, MD  Vitamin D, Ergocalciferol, (DRISDOL) 1.25 MG (50000 UT) CAPS capsule Take 1 capsule (50,000 Units total) by mouth every 7 (seven) days. 05/07/19   Rutherford Guys, MD    Allergies Ambien [zolpidem tartrate], Iohexol, Seroquel [quetiapine fumarate], and Sonata [zaleplon]   REVIEW OF SYSTEMS  Negative except as noted here or in the History of Present Illness.   PHYSICAL EXAMINATION  Initial Vital Signs Blood pressure 125/81, pulse 96, temperature 97.9 F (36.6 C), resp. rate 18, height 5\' 5"  (1.651 m), weight 49.9 kg, SpO2 97 %.  Examination General: Well-developed, cachectic male in no acute distress; appearance consistent with age of record HENT: normocephalic; atraumatic; edentulous; no pharyngeal erythema, edema or exudate Eyes: pupils equal, round and reactive to light; extraocular muscles intact Neck: supple; no lymphadenopathy  Heart: regular rate and rhythm Lungs: clear to auscultation bilaterally Abdomen: soft; nondistended; nontender; no masses or hepatosplenomegaly; bowel sounds present Extremities: No deformity; full range of motion; pulses normal Neurologic: Awake, alert and oriented; motor function intact in all extremities and symmetric; no facial droop Skin: Warm and dry Psychiatric: Normal mood and affect   RESULTS  Summary of this visit's results, reviewed by myself:   EKG Interpretation  Date/Time:    Ventricular Rate:    PR Interval:    QRS Duration:   QT Interval:    QTC Calculation:   R Axis:     Text Interpretation:        Laboratory Studies: Results for orders placed or performed during the hospital encounter of 05/29/19 (from the past 24 hour(s))  Group A Strep by PCR     Status: None   Collection Time: 05/29/19  5:50 AM   Specimen: Throat; Sterile Swab  Result Value Ref Range   Group A Strep by PCR NOT DETECTED NOT DETECTED   Imaging Studies: No results found.  ED COURSE and MDM  Nursing notes and initial vitals signs, including pulse oximetry, reviewed.  Vitals:   05/29/19 0544 05/29/19 0545  BP:  125/81  Pulse: 96   Resp: 18   Temp: 97.9 F (36.6 C)   SpO2: 97%   Weight: 49.9 kg   Height: 5\' 5"  (1.651 m)    Jerry Knight was evaluated in Emergency Department on 05/29/2019 for the symptoms described in the history of present illness. He was evaluated in the context of the global COVID-19 pandemic, which necessitated consideration that the patient might be at risk for infection with the SARS-CoV-2 virus that causes COVID-19. Institutional protocols and algorithms that pertain to the evaluation of patients at risk for COVID-19 are in a state of rapid change based on information released by regulatory bodies including the CDC and federal and state organizations. These policies and algorithms were followed during the patient's care in the ED.  Symptoms and examination  consistent with a viral pharyngitis.  He has no other symptoms to suggest COVID-19.  He was advised to return if symptoms are worsening especially difficulty breathing.  PROCEDURES    ED DIAGNOSES     ICD-10-CM   1. Sore throat  J02.9        Lavenia Stumpo, Jenny Reichmann, MD 05/29/19 [id number]

## 2019-05-29 NOTE — ED Triage Notes (Signed)
Pt states that he noticed his throat getting sore yesterday and it has gotten worse. States it hurts when he swallows and mostly on the right side. Denies fever, chills, nausea, or vomiting.

## 2019-05-29 NOTE — ED Notes (Signed)
ED Provider at bedside. 

## 2019-06-03 DIAGNOSIS — N200 Calculus of kidney: Secondary | ICD-10-CM | POA: Diagnosis not present

## 2019-06-03 DIAGNOSIS — N401 Enlarged prostate with lower urinary tract symptoms: Secondary | ICD-10-CM | POA: Diagnosis not present

## 2019-06-03 DIAGNOSIS — F1120 Opioid dependence, uncomplicated: Secondary | ICD-10-CM | POA: Diagnosis not present

## 2019-06-03 DIAGNOSIS — R3915 Urgency of urination: Secondary | ICD-10-CM | POA: Diagnosis not present

## 2019-06-03 DIAGNOSIS — G4700 Insomnia, unspecified: Secondary | ICD-10-CM | POA: Diagnosis not present

## 2019-06-03 DIAGNOSIS — R358 Other polyuria: Secondary | ICD-10-CM | POA: Diagnosis not present

## 2019-06-03 DIAGNOSIS — R350 Frequency of micturition: Secondary | ICD-10-CM | POA: Diagnosis not present

## 2019-06-10 DIAGNOSIS — G4700 Insomnia, unspecified: Secondary | ICD-10-CM | POA: Diagnosis not present

## 2019-06-10 DIAGNOSIS — F1120 Opioid dependence, uncomplicated: Secondary | ICD-10-CM | POA: Diagnosis not present

## 2019-06-15 DIAGNOSIS — Z7151 Drug abuse counseling and surveillance of drug abuser: Secondary | ICD-10-CM | POA: Diagnosis not present

## 2019-06-15 DIAGNOSIS — F1120 Opioid dependence, uncomplicated: Secondary | ICD-10-CM | POA: Diagnosis not present

## 2019-06-17 DIAGNOSIS — F1120 Opioid dependence, uncomplicated: Secondary | ICD-10-CM | POA: Diagnosis not present

## 2019-06-17 DIAGNOSIS — G4700 Insomnia, unspecified: Secondary | ICD-10-CM | POA: Diagnosis not present

## 2019-06-24 DIAGNOSIS — G4700 Insomnia, unspecified: Secondary | ICD-10-CM | POA: Diagnosis not present

## 2019-06-24 DIAGNOSIS — F1120 Opioid dependence, uncomplicated: Secondary | ICD-10-CM | POA: Diagnosis not present

## 2019-06-28 DIAGNOSIS — F1120 Opioid dependence, uncomplicated: Secondary | ICD-10-CM | POA: Diagnosis not present

## 2019-06-28 DIAGNOSIS — G4700 Insomnia, unspecified: Secondary | ICD-10-CM | POA: Diagnosis not present

## 2019-07-01 DIAGNOSIS — F1120 Opioid dependence, uncomplicated: Secondary | ICD-10-CM | POA: Diagnosis not present

## 2019-07-01 DIAGNOSIS — G4700 Insomnia, unspecified: Secondary | ICD-10-CM | POA: Diagnosis not present

## 2019-07-08 DIAGNOSIS — G4700 Insomnia, unspecified: Secondary | ICD-10-CM | POA: Diagnosis not present

## 2019-07-08 DIAGNOSIS — Z1159 Encounter for screening for other viral diseases: Secondary | ICD-10-CM | POA: Diagnosis not present

## 2019-07-08 DIAGNOSIS — F1120 Opioid dependence, uncomplicated: Secondary | ICD-10-CM | POA: Diagnosis not present

## 2019-07-13 DIAGNOSIS — D122 Benign neoplasm of ascending colon: Secondary | ICD-10-CM | POA: Diagnosis not present

## 2019-07-13 DIAGNOSIS — K219 Gastro-esophageal reflux disease without esophagitis: Secondary | ICD-10-CM | POA: Diagnosis not present

## 2019-07-13 DIAGNOSIS — Z903 Acquired absence of stomach [part of]: Secondary | ICD-10-CM | POA: Diagnosis not present

## 2019-07-13 DIAGNOSIS — Z1211 Encounter for screening for malignant neoplasm of colon: Secondary | ICD-10-CM | POA: Diagnosis not present

## 2019-07-13 DIAGNOSIS — R109 Unspecified abdominal pain: Secondary | ICD-10-CM | POA: Diagnosis not present

## 2019-07-13 LAB — HM COLONOSCOPY

## 2019-07-15 DIAGNOSIS — F1120 Opioid dependence, uncomplicated: Secondary | ICD-10-CM | POA: Diagnosis not present

## 2019-07-15 DIAGNOSIS — G4700 Insomnia, unspecified: Secondary | ICD-10-CM | POA: Diagnosis not present

## 2019-07-16 DIAGNOSIS — D122 Benign neoplasm of ascending colon: Secondary | ICD-10-CM | POA: Diagnosis not present

## 2019-07-22 DIAGNOSIS — G4700 Insomnia, unspecified: Secondary | ICD-10-CM | POA: Diagnosis not present

## 2019-07-22 DIAGNOSIS — M7918 Myalgia, other site: Secondary | ICD-10-CM | POA: Diagnosis not present

## 2019-07-22 DIAGNOSIS — F1120 Opioid dependence, uncomplicated: Secondary | ICD-10-CM | POA: Diagnosis not present

## 2019-07-26 DIAGNOSIS — G4700 Insomnia, unspecified: Secondary | ICD-10-CM | POA: Diagnosis not present

## 2019-07-26 DIAGNOSIS — F1120 Opioid dependence, uncomplicated: Secondary | ICD-10-CM | POA: Diagnosis not present

## 2019-07-28 DIAGNOSIS — F1120 Opioid dependence, uncomplicated: Secondary | ICD-10-CM | POA: Diagnosis not present

## 2019-07-29 DIAGNOSIS — G4700 Insomnia, unspecified: Secondary | ICD-10-CM | POA: Diagnosis not present

## 2019-07-29 DIAGNOSIS — F1120 Opioid dependence, uncomplicated: Secondary | ICD-10-CM | POA: Diagnosis not present

## 2019-08-04 DIAGNOSIS — F1120 Opioid dependence, uncomplicated: Secondary | ICD-10-CM | POA: Diagnosis not present

## 2019-08-05 DIAGNOSIS — F1120 Opioid dependence, uncomplicated: Secondary | ICD-10-CM | POA: Diagnosis not present

## 2019-08-05 DIAGNOSIS — G4700 Insomnia, unspecified: Secondary | ICD-10-CM | POA: Diagnosis not present

## 2019-08-12 DIAGNOSIS — F1120 Opioid dependence, uncomplicated: Secondary | ICD-10-CM | POA: Diagnosis not present

## 2019-08-12 DIAGNOSIS — F1994 Other psychoactive substance use, unspecified with psychoactive substance-induced mood disorder: Secondary | ICD-10-CM | POA: Diagnosis not present

## 2019-08-12 DIAGNOSIS — F17200 Nicotine dependence, unspecified, uncomplicated: Secondary | ICD-10-CM | POA: Diagnosis not present

## 2019-08-19 DIAGNOSIS — F1120 Opioid dependence, uncomplicated: Secondary | ICD-10-CM | POA: Diagnosis not present

## 2019-08-19 DIAGNOSIS — F1320 Sedative, hypnotic or anxiolytic dependence, uncomplicated: Secondary | ICD-10-CM | POA: Diagnosis not present

## 2019-08-19 DIAGNOSIS — F19982 Other psychoactive substance use, unspecified with psychoactive substance-induced sleep disorder: Secondary | ICD-10-CM | POA: Diagnosis not present

## 2019-08-19 DIAGNOSIS — M7918 Myalgia, other site: Secondary | ICD-10-CM | POA: Diagnosis not present

## 2019-08-26 DIAGNOSIS — F1120 Opioid dependence, uncomplicated: Secondary | ICD-10-CM | POA: Diagnosis not present

## 2019-08-27 ENCOUNTER — Telehealth: Payer: Self-pay | Admitting: Family Medicine

## 2019-08-27 ENCOUNTER — Ambulatory Visit: Payer: Medicare HMO | Admitting: Family Medicine

## 2019-08-27 NOTE — Telephone Encounter (Signed)
Pt is calling concerning a referral he said was going to be put in for him. He did have an app today 12-Sep-2019, but his father passed away. Please advise pt on status of his ortho referral [phone]

## 2019-09-02 DIAGNOSIS — F1120 Opioid dependence, uncomplicated: Secondary | ICD-10-CM | POA: Diagnosis not present

## 2019-09-02 DIAGNOSIS — G4700 Insomnia, unspecified: Secondary | ICD-10-CM | POA: Diagnosis not present

## 2019-09-03 NOTE — Telephone Encounter (Signed)
Spoke with pt and schedule appt.

## 2019-09-06 ENCOUNTER — Ambulatory Visit (INDEPENDENT_AMBULATORY_CARE_PROVIDER_SITE_OTHER): Payer: Medicare HMO | Admitting: Family Medicine

## 2019-09-06 ENCOUNTER — Encounter: Payer: Self-pay | Admitting: Family Medicine

## 2019-09-06 ENCOUNTER — Other Ambulatory Visit: Payer: Self-pay

## 2019-09-06 ENCOUNTER — Ambulatory Visit (INDEPENDENT_AMBULATORY_CARE_PROVIDER_SITE_OTHER): Payer: Medicare HMO

## 2019-09-06 VITALS — BP 113/76 | HR 90 | Temp 98.3°F | Ht 65.0 in | Wt 109.0 lb

## 2019-09-06 DIAGNOSIS — Z23 Encounter for immunization: Secondary | ICD-10-CM | POA: Diagnosis not present

## 2019-09-06 DIAGNOSIS — M25521 Pain in right elbow: Secondary | ICD-10-CM | POA: Diagnosis not present

## 2019-09-06 DIAGNOSIS — E559 Vitamin D deficiency, unspecified: Secondary | ICD-10-CM

## 2019-09-06 DIAGNOSIS — H9193 Unspecified hearing loss, bilateral: Secondary | ICD-10-CM | POA: Diagnosis not present

## 2019-09-06 DIAGNOSIS — Z903 Acquired absence of stomach [part of]: Secondary | ICD-10-CM

## 2019-09-06 DIAGNOSIS — E7800 Pure hypercholesterolemia, unspecified: Secondary | ICD-10-CM

## 2019-09-06 DIAGNOSIS — M25522 Pain in left elbow: Secondary | ICD-10-CM

## 2019-09-06 NOTE — Progress Notes (Signed)
10/5/202010:53 AM  Jerry Knight 08-Oct-1961, 58 y.o., male [id number]  Chief Complaint  Patient presents with  . Follow-up    pain in the arms, cannot lift also asking for referral for hearing aid. Says he hears birds chirping in his ears    HPI:   Patient is a 58 y.o. male with past medical history significant for chronic pain form multiple abd surgery and addiction, vitamin D deficiency, HLP, gout, tobacco use who presents today for routine followup  Last OV June 2020 - was having pain in elbows then, normal uric acid, plan was referral to sports medicine  Overall doing well Main concerns continues to be chronic BUE arm main, mostly in his elbows, inside, worse when he flexes his arm He has several episodes when arm becomes floppy, no numbness or tingling.  No major involvement of other UE joints No joint swelling, erythema or warmth  He is also asking for referral for hearing eval, deceased loss, 'Birds chirping", present for years, having difficulty following conversations in groups  Also requesting referral to another GI, Dr Michael Litter H/o abd surgery with sign scar tissues  He has required dilatation of anastomosis site in the past He had food in his stomach during EGD despite having been on clear diet as instructed Reports about weekly vomiting if he eats larger meals and not moving bowels well  Lab Results  Component Value Date   CHOL 153 04/28/2019   HDL 38 (L) 04/28/2019   LDLCALC 104 (H) 04/28/2019   TRIG 57 04/28/2019   CHOLHDL 4.0 04/28/2019   Lab Results  Component Value Date   LABURIC 5.1 05/27/2019    Depression screen PHQ 2/9 09/06/2019 05/27/2019 03/31/2019  Decreased Interest 0 0 0  Down, Depressed, Hopeless 0 0 0  PHQ - 2 Score 0 0 0    Fall Risk  09/06/2019 05/27/2019 03/31/2019 03/31/2019 02/01/2019  Falls in the past year? 0 0 0 0 0  Number falls in past yr: 0 0 0 - 0  Injury with Fall? 0 0 0 - 0  Risk for fall due to : - - - - History of fall(s)      Allergies  Allergen Reactions  . Ambien [Zolpidem Tartrate] Other (See Comments)    Black out  . Iohexol     Pt claims he has chest tightness and sob after iv contrast injections.  . Seroquel [Quetiapine Fumarate]     Blackout  . Sonata [Zaleplon]     "black out'  confused    Prior to Admission medications   Medication Sig Start Date End Date Taking? Authorizing Provider  atorvastatin (LIPITOR) 10 MG tablet Take 1 tablet (10 mg total) by mouth daily. 05/27/19  Yes Rutherford Guys, MD  buprenorphine-naloxone (SUBOXONE) 8-2 mg SUBL SL tablet Place 1 tablet under the tongue daily.   Yes [provider]  colchicine 0.6 MG tablet Take 1 tablet (0.6 mg total) by mouth 2 (two) times daily as needed (at onset of gout flare up). 05/27/19  Yes Rutherford Guys, MD  cyclobenzaprine (FLEXERIL) 10 MG tablet TK 1 T PO Q 8 H 12/31/18  Yes [provider]  mirtazapine (REMERON) 45 MG tablet Take 45 mg by mouth at bedtime.   Yes [provider]  traZODone (DESYREL) 50 MG tablet Take 100 mg by mouth at bedtime.    Yes [provider]  Vitamin D, Ergocalciferol, (DRISDOL) 1.25 MG (50000 UT) CAPS capsule Take 1 capsule (50,000 Units  total) by mouth every 7 (seven) days. 05/07/19  Yes Rutherford Guys, MD    Past Medical History:  Diagnosis Date  . Blood transfusion without reported diagnosis   . Chest pain   . Chronic neck pain   . Chronic pain   . Chronic pancreatitis ([place])   . COPD (chronic obstructive pulmonary disease) ([place])   . Degenerative disk disease    C 5 &C 6  . Depression   . GI bleed   . Gout   . Migraine   . Migraine headache   . Pain management contract agreement   . Seizure disorder ([place])    x 7 years  . Seizures ([place])   . 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 Zollinger-Ellison syndrome     Past Surgical History:  Procedure Laterality Date  . APPENDECTOMY     per CT 2012- s/p appendectomy  . CHOLECYSTECTOMY    .  EYE SURGERY    . HERNIA REPAIR    . PARTIAL GASTRECTOMY    . SMALL INTESTINE SURGERY      Social History   Tobacco Use  . Smoking status: Current Every Day Smoker    Packs/day: 2.00    Types: Cigarettes  . Smokeless tobacco: Never Used  . Tobacco comment: about 2 packs a day  Substance Use Topics  . Alcohol use: No    Family History  Problem Relation Age of Onset  . Angina Mother   . Nephrolithiasis Father   . Cancer Brother     Review of Systems  Constitutional: Negative for chills and fever.  Respiratory: Negative for cough and shortness of breath.   Cardiovascular: Negative for chest pain, palpitations and leg swelling.  Gastrointestinal: Positive for abdominal pain, constipation, nausea and vomiting.   Per hpi  OBJECTIVE:  Today's Vitals   09/06/19 1046  BP: 113/76  Pulse: 90  Temp: 98.3 F (36.8 C)  SpO2: 94%  Weight: 109 lb (49.4 kg)  Height: 5\' 5"  (1.651 m)   Body mass index is 18.14 kg/m.  Wt Readings from Last 3 Encounters:  09/06/19 109 lb (49.4 kg)  05/29/19 110 lb (49.9 kg)  05/27/19 109 lb 6.4 oz (49.6 kg)    Physical Exam Vitals signs and nursing note reviewed.  Constitutional:      Appearance: He is well-developed.  HENT:     Head: Normocephalic and atraumatic.  Eyes:     Conjunctiva/sclera: Conjunctivae normal.     Pupils: Pupils are equal, round, and reactive to light.  Neck:     Musculoskeletal: Neck supple.  Cardiovascular:     Rate and Rhythm: Normal rate and regular rhythm.     Heart sounds: No murmur. No friction rub. No gallop.   Pulmonary:     Effort: Pulmonary effort is normal.     Breath sounds: Normal breath sounds. No wheezing or rales.  Musculoskeletal:     Right elbow: He exhibits normal range of motion and no swelling. Tenderness found. Lateral epicondyle and olecranon process tenderness noted.     Left elbow: He exhibits normal range of motion and no swelling. Tenderness found. Medial epicondyle tenderness noted.   Skin:    General: Skin is warm and dry.  Neurological:     Mental Status: He is alert and oriented to person, place, and time.     Sensory: Sensation is intact.     Motor: Motor function is intact.     Deep  Tendon Reflexes: Reflexes are normal and symmetric.     No results found for this or any previous visit (from the past 24 hour(s)).  Dg Elbow 2 Views Left  Result Date: 09/06/2019 CLINICAL DATA:  Chronic bilateral elbow pain. EXAM: LEFT ELBOW - 2 VIEW; RIGHT ELBOW - 2 VIEW COMPARISON:  None. FINDINGS: There is no evidence of fracture, dislocation, or joint effusion. There is no evidence of arthropathy or other focal bone abnormality. Soft tissues are unremarkable. IMPRESSION: Normal exam. Electronically Signed   By: Inge Rise M.D.   On: 09/06/2019 11:38   Dg Elbow 2 Views Right  Result Date: 09/06/2019 CLINICAL DATA:  Chronic bilateral elbow pain. EXAM: LEFT ELBOW - 2 VIEW; RIGHT ELBOW - 2 VIEW COMPARISON:  None. FINDINGS: There is no evidence of fracture, dislocation, or joint effusion. There is no evidence of arthropathy or other focal bone abnormality. Soft tissues are unremarkable. IMPRESSION: Normal exam. Electronically Signed   By: Inge Rise M.D.   On: 09/06/2019 11:38     ASSESSMENT and PLAN  1. Arthralgia of both elbows Exam suggestive of epicondylitis, discussed conservative measures, use of brace, referring to sports medicine, h/o recurrent weakness - DG Elbow 2 Views Left; Future - DG Elbow 2 Views Right; Future - Ambulatory referral to Sports Medicine  2. Decreased hearing, bilateral - Ambulatory referral to ENT  3. H/O laparoscopic partial gastrectomy - Ambulatory referral to Gastroenterology  4. Pure hypercholesterolemia Checking labs today, medications will be adjusted as needed.  - Comprehensive metabolic panel - Lipid panel  5. Vitamin D deficiency Checking labs today, medications will be adjusted as needed.  - Vitamin D, 25-hydroxy  6.  Need for prophylactic vaccination and inoculation against influenza - Flu Vaccine QUAD 36+ mos IM  Return in about 6 months (around 03/06/2020).    Rutherford Guys, MD [facility] [place], [place] [address] Ph.  [phone] Fax [phone]

## 2019-09-06 NOTE — Patient Instructions (Signed)
° ° ° °  If you have lab work done today you will be contacted with your lab results within the next 2 weeks.  If you have not heard from us then please contact us. The fastest way to get your results is to register for My Chart. ° ° °IF you received an x-ray today, you will receive an invoice from [facility]. Please contact [facility] at [phone] with questions or concerns regarding your invoice.  ° °IF you received labwork today, you will receive an invoice from [facility]. Please contact [facility] at [phone] with questions or concerns regarding your invoice.  ° °Our billing staff will not be able to assist you with questions regarding bills from these companies. ° °You will be contacted with the lab results as soon as they are available. The fastest way to get your results is to activate your My Chart account. Instructions are located on the last page of this paperwork. If you have not heard from us regarding the results in 2 weeks, please contact this office. °  ° ° ° °

## 2019-09-07 LAB — COMPREHENSIVE METABOLIC PANEL
ALT: 10 IU/L (ref 0–44)
AST: 17 IU/L (ref 0–40)
Albumin/Globulin Ratio: 1.4 (ref 1.2–2.2)
Albumin: 4.3 g/dL (ref 3.8–4.9)
Alkaline Phosphatase: 141 IU/L — ABNORMAL HIGH (ref 39–117)
BUN/Creatinine Ratio: 11 (ref 9–20)
BUN: 9 mg/dL (ref 6–24)
Bilirubin Total: 0.4 mg/dL (ref 0.0–1.2)
CO2: 23 mmol/L (ref 20–29)
Calcium: 9.8 mg/dL (ref 8.7–10.2)
Chloride: 99 mmol/L (ref 96–106)
Creatinine, Ser: 0.83 mg/dL (ref 0.76–1.27)
GFR calc Af Amer: 114 mL/min/{1.73_m2} (ref >59)
GFR calc non Af Amer: 98 mL/min/{1.73_m2} (ref >59)
Globulin, Total: 3.1 g/dL (ref 1.5–4.5)
Glucose: 95 mg/dL (ref 65–99)
Potassium: 4.1 mmol/L (ref 3.5–5.2)
Sodium: 134 mmol/L (ref 134–144)
Total Protein: 7.4 g/dL (ref 6.0–8.5)

## 2019-09-07 LAB — LIPID PANEL
Chol/HDL Ratio: 4.6 ratio (ref 0.0–5.0)
Cholesterol, Total: 160 mg/dL (ref 100–199)
HDL: 35 mg/dL — ABNORMAL LOW (ref >39)
LDL Chol Calc (NIH): 105 mg/dL — ABNORMAL HIGH (ref 0–99)
Triglycerides: 107 mg/dL (ref 0–149)
VLDL Cholesterol Cal: 20 mg/dL (ref 5–40)

## 2019-09-07 LAB — VITAMIN D 25 HYDROXY (VIT D DEFICIENCY, FRACTURES): Vit D, 25-Hydroxy: 27.2 ng/mL — ABNORMAL LOW (ref 30.0–100.0)

## 2019-09-09 DIAGNOSIS — M7918 Myalgia, other site: Secondary | ICD-10-CM | POA: Diagnosis not present

## 2019-09-09 DIAGNOSIS — F1120 Opioid dependence, uncomplicated: Secondary | ICD-10-CM | POA: Diagnosis not present

## 2019-09-09 DIAGNOSIS — G894 Chronic pain syndrome: Secondary | ICD-10-CM | POA: Diagnosis not present

## 2019-09-15 ENCOUNTER — Encounter: Payer: Self-pay | Admitting: Sports Medicine

## 2019-09-15 ENCOUNTER — Ambulatory Visit: Payer: Self-pay

## 2019-09-15 ENCOUNTER — Other Ambulatory Visit: Payer: Self-pay

## 2019-09-15 ENCOUNTER — Ambulatory Visit: Payer: Medicare HMO | Admitting: Sports Medicine

## 2019-09-15 VITALS — BP 100/64 | Ht 65.5 in | Wt 110.0 lb

## 2019-09-15 DIAGNOSIS — M25522 Pain in left elbow: Secondary | ICD-10-CM | POA: Diagnosis not present

## 2019-09-15 DIAGNOSIS — M25521 Pain in right elbow: Secondary | ICD-10-CM | POA: Diagnosis not present

## 2019-09-15 NOTE — Progress Notes (Addendum)
PCP: Rutherford Guys, MD  Subjective:   HPI: Patient is a 58 y.o. male here for evaluation of bilateral elbow pain.  Patient notes the pain started approximately 3 months ago.  The pain is located both on the anterior aspect of his elbows and the posterior aspect of his elbows.  The pain is felt in the back of the elbow when he extends his elbows, and the pain is felt in the front of the elbows when he flexes his elbows.  He denies any injury or trauma.  Pain does not radiate.  He denies any bruising or swelling.  He has no numbness or tingling.  Patient has not taken anything yet for the pain.  Of note patient has a remote history of IV heroin use.  Patient notes he last injected 3 years ago and has stayed clean since then.  Patient denies any infections in his elbow in the past.   Review of Systems: See HPI above.  Past Medical History:  Diagnosis Date  . Blood transfusion without reported diagnosis   . Chest pain   . Chronic neck pain   . Chronic pain   . Chronic pancreatitis ([place])   . COPD (chronic obstructive pulmonary disease) ([place])   . Degenerative disk disease    C 5 &C 6  . Depression   . GI bleed   . Gout   . Migraine   . Migraine headache   . Pain management contract agreement   . Seizure disorder ([place])    x 7 years  . Seizures ([place])   . Substance abuse ([place])    Has been cleaned for 18 mos  . Zollinger-Ellison syndrome     Current Outpatient Medications on File Prior to Visit  Medication Sig Dispense Refill  . atorvastatin (LIPITOR) 10 MG tablet Take 1 tablet (10 mg total) by mouth daily. 90 tablet 3  . buprenorphine-naloxone (SUBOXONE) 8-2 mg SUBL SL tablet Place 1 tablet under the tongue daily.    . colchicine 0.6 MG tablet Take 1 tablet (0.6 mg total) by mouth 2 (two) times daily as needed (at onset of gout flare up). 30 tablet 0  . cyclobenzaprine (FLEXERIL) 10 MG tablet TK 1 T PO Q 8 H    . mirtazapine (REMERON) 45 MG tablet Take 45 mg by mouth at bedtime.     . traZODone (DESYREL) 50 MG tablet Take 100 mg by mouth at bedtime.     . Vitamin D, Ergocalciferol, (DRISDOL) 1.25 MG (50000 UT) CAPS capsule Take 1 capsule (50,000 Units total) by mouth every 7 (seven) days. 12 capsule 0   No current facility-administered medications on file prior to visit.     Past Surgical History:  Procedure Laterality Date  . APPENDECTOMY     per CT 2012- s/p appendectomy  . CHOLECYSTECTOMY    . EYE SURGERY    . HERNIA REPAIR    . PARTIAL GASTRECTOMY    . SMALL INTESTINE SURGERY      Allergies  Allergen Reactions  . Ambien [Zolpidem Tartrate] Other (See Comments)    Black out  . Iohexol     Pt claims he has chest tightness and sob after iv contrast injections.  . Seroquel [Quetiapine Fumarate]     Blackout  . Sonata [Zaleplon]     "black out'  confused    Social History   Socioeconomic History  . Marital status: Married    Spouse name: Not on file  . Number of children:  Not on file  . Years of education: Not on file  . Highest education level: Not on file  Occupational History  . Not on file  Social Needs  . Financial resource strain: Not on file  . Food insecurity    Worry: Not on file    Inability: Not on file  . Transportation needs    Medical: Not on file    Non-medical: Not on file  Tobacco Use  . Smoking status: Current Every Day Smoker    Packs/day: 2.00    Types: Cigarettes  . Smokeless tobacco: Never Used  . Tobacco comment: about 2 packs a day  Substance and Sexual Activity  . Alcohol use: No  . Drug use: Yes    Types: Marijuana, Other-see comments    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  . Sexual activity: Not on file  Lifestyle  . Physical activity    Days per week: Not on file    Minutes per session: Not on file  . Stress: Not on file  Relationships  . Social Herbalist on phone: Not on file    Gets together: Not on file    Attends religious service: Not on file    Active member of club  or organization: Not on file    Attends meetings of clubs or organizations: Not on file    Relationship status: Not on file  . Intimate partner violence    Fear of current or ex partner: Not on file    Emotionally abused: Not on file    Physically abused: Not on file    Forced sexual activity: Not on file  Other Topics Concern  . Not on file  Social History Narrative  . Not on file    Family History  Problem Relation Age of Onset  . Angina Mother   . Nephrolithiasis Father   . Cancer Brother         Objective:  Physical Exam: BP 100/64   Ht 5' 5.5" (1.664 m)   Wt 110 lb (49.9 kg)   BMI 18.03 kg/m  Gen: NAD, comfortable in exam room Lungs: Breathing comfortably on room air Elbow Exam Right -Inspection: No discoloration, no deformity -Palpation: No tenderness to palpation -ROM: Normal ROM with flexion, extension, pronation, supination.  Normal range of motion at the wrist with flexion, extension, ulnar deviation, radial deviation -Strength: 5/5 strength with flexion, extension, pronation, supination.  Normal strength at the wrist with flexion, extension, ulnar deviation, radial deviation -Valgus stress: Negative -Tinels Sign: Negative -Limb neurovascularly intact, no instability noted  Contralateral Elbow -Inspection: No discoloration, no deformity -Palpation: No tenderness to palpation -ROM: Normal ROM with flexion, extension, pronation, supination -Strength: 5/5 strength with flexion, extension, pronation, supination -Limb neurovascularly intact, no instability noted   Limited diagnostic ultrasound of the right elbow Findings: - Anterior elbow: Normal appearance of the anterior elbow.  No cortical irregularities.  Normal appearance of the cartilage.  Normal appearance of the overlying musculature - Medial elbow: Cortical irregularities noted at the medial epicondyle.  There were some erosions noted along the medial epicondyles well.  Normal appearance of the common  flexor tendon. -Lateral elbow: Cortical irregularities noted at the lateral epicondyle.  There are also erosions noted here as well.  Normal appearance of the common extensor tendon. - Posterior elbow: Normal appearance of the triceps tendon without any tears.  Normal appearance of the olecranon.  Moderate joint effusion noted. Impression: - Moderate  joint effusion with cortical irregularities and erosions noted at the humerus both medially and laterally   Assessment & Plan:  Patient is a 58 y.o. male here for evaluation of bilateral elbow pain  1.  Bilateral elbow pain - Ultrasound findings as stated above.  Likely secondary to arthritic process -We will obtain bilateral x-rays of the elbow for further evaluation -Patient given home exercises for both bicep and tricep strengthening -Patient given a compression sleeve to wear on his elbow -Patient may take Tylenol and use topical Voltaren gel as needed for pain -Patient will follow-up in 4 weeks.  At that point if he still having symptoms we will consider further evaluation with an MRI  Addendum:  Patient seen in the office by fellow.  His history, exam, plan of care were precepted with me.  Karlton Lemon MD Kirt Boys

## 2019-09-15 NOTE — Patient Instructions (Addendum)
Your elbow pain may be caused by some arthritis in the elbow joint -We will obtain an x-ray to further evaluate your elbows - We will give you compression sleeve to wear on your elbows.  This will help with the pain -You should also take Tylenol and use over-the-counter topical Voltaren gel for your pain -Work on the exercises shown to you at today's visit  We will see you back in 4 weeks.  At that time if you are still having symptoms we will discuss getting an MRI

## 2019-09-16 DIAGNOSIS — G4700 Insomnia, unspecified: Secondary | ICD-10-CM | POA: Diagnosis not present

## 2019-09-16 DIAGNOSIS — F1120 Opioid dependence, uncomplicated: Secondary | ICD-10-CM | POA: Diagnosis not present

## 2019-09-21 ENCOUNTER — Encounter: Payer: Self-pay | Admitting: Radiology

## 2019-09-23 DIAGNOSIS — G4700 Insomnia, unspecified: Secondary | ICD-10-CM | POA: Diagnosis not present

## 2019-09-23 DIAGNOSIS — F1120 Opioid dependence, uncomplicated: Secondary | ICD-10-CM | POA: Diagnosis not present

## 2019-09-28 DIAGNOSIS — Z681 Body mass index (BMI) 19 or less, adult: Secondary | ICD-10-CM | POA: Diagnosis not present

## 2019-09-28 DIAGNOSIS — Z20828 Contact with and (suspected) exposure to other viral communicable diseases: Secondary | ICD-10-CM | POA: Diagnosis not present

## 2019-09-30 DIAGNOSIS — G4700 Insomnia, unspecified: Secondary | ICD-10-CM | POA: Diagnosis not present

## 2019-09-30 DIAGNOSIS — F1120 Opioid dependence, uncomplicated: Secondary | ICD-10-CM | POA: Diagnosis not present

## 2019-10-07 DIAGNOSIS — G4700 Insomnia, unspecified: Secondary | ICD-10-CM | POA: Diagnosis not present

## 2019-10-07 DIAGNOSIS — F1120 Opioid dependence, uncomplicated: Secondary | ICD-10-CM | POA: Diagnosis not present

## 2019-10-14 DIAGNOSIS — G4700 Insomnia, unspecified: Secondary | ICD-10-CM | POA: Diagnosis not present

## 2019-10-14 DIAGNOSIS — F1120 Opioid dependence, uncomplicated: Secondary | ICD-10-CM | POA: Diagnosis not present

## 2019-10-18 DIAGNOSIS — G4700 Insomnia, unspecified: Secondary | ICD-10-CM | POA: Diagnosis not present

## 2019-10-18 DIAGNOSIS — F1120 Opioid dependence, uncomplicated: Secondary | ICD-10-CM | POA: Diagnosis not present

## 2019-10-21 DIAGNOSIS — F1120 Opioid dependence, uncomplicated: Secondary | ICD-10-CM | POA: Diagnosis not present

## 2019-10-21 DIAGNOSIS — G4700 Insomnia, unspecified: Secondary | ICD-10-CM | POA: Diagnosis not present

## 2019-10-27 DIAGNOSIS — F1120 Opioid dependence, uncomplicated: Secondary | ICD-10-CM | POA: Diagnosis not present

## 2019-10-27 DIAGNOSIS — G4700 Insomnia, unspecified: Secondary | ICD-10-CM | POA: Diagnosis not present

## 2019-11-04 DIAGNOSIS — G4700 Insomnia, unspecified: Secondary | ICD-10-CM | POA: Diagnosis not present

## 2019-11-04 DIAGNOSIS — F1120 Opioid dependence, uncomplicated: Secondary | ICD-10-CM | POA: Diagnosis not present

## 2019-11-11 DIAGNOSIS — G4700 Insomnia, unspecified: Secondary | ICD-10-CM | POA: Diagnosis not present

## 2019-11-11 DIAGNOSIS — F1120 Opioid dependence, uncomplicated: Secondary | ICD-10-CM | POA: Diagnosis not present

## 2019-11-16 DIAGNOSIS — F1120 Opioid dependence, uncomplicated: Secondary | ICD-10-CM | POA: Diagnosis not present

## 2019-11-18 DIAGNOSIS — F1120 Opioid dependence, uncomplicated: Secondary | ICD-10-CM | POA: Diagnosis not present

## 2019-11-18 DIAGNOSIS — G4700 Insomnia, unspecified: Secondary | ICD-10-CM | POA: Diagnosis not present

## 2019-11-21 DIAGNOSIS — G4700 Insomnia, unspecified: Secondary | ICD-10-CM | POA: Diagnosis not present

## 2019-11-21 DIAGNOSIS — F1120 Opioid dependence, uncomplicated: Secondary | ICD-10-CM | POA: Diagnosis not present

## 2019-11-24 DIAGNOSIS — F1120 Opioid dependence, uncomplicated: Secondary | ICD-10-CM | POA: Diagnosis not present

## 2019-11-24 DIAGNOSIS — G4700 Insomnia, unspecified: Secondary | ICD-10-CM | POA: Diagnosis not present

## 2019-11-28 DIAGNOSIS — G4700 Insomnia, unspecified: Secondary | ICD-10-CM | POA: Diagnosis not present

## 2019-11-28 DIAGNOSIS — F1120 Opioid dependence, uncomplicated: Secondary | ICD-10-CM | POA: Diagnosis not present

## 2019-11-29 DIAGNOSIS — F1120 Opioid dependence, uncomplicated: Secondary | ICD-10-CM | POA: Diagnosis not present

## 2019-11-30 ENCOUNTER — Other Ambulatory Visit: Payer: Self-pay | Admitting: Unknown Physician Specialty

## 2019-11-30 DIAGNOSIS — K311 Adult hypertrophic pyloric stenosis: Secondary | ICD-10-CM | POA: Diagnosis not present

## 2019-11-30 DIAGNOSIS — K5904 Chronic idiopathic constipation: Secondary | ICD-10-CM | POA: Diagnosis not present

## 2019-12-01 DIAGNOSIS — G4700 Insomnia, unspecified: Secondary | ICD-10-CM | POA: Diagnosis not present

## 2019-12-01 DIAGNOSIS — F1120 Opioid dependence, uncomplicated: Secondary | ICD-10-CM | POA: Diagnosis not present

## 2019-12-05 DIAGNOSIS — G4700 Insomnia, unspecified: Secondary | ICD-10-CM | POA: Diagnosis not present

## 2019-12-05 DIAGNOSIS — F17200 Nicotine dependence, unspecified, uncomplicated: Secondary | ICD-10-CM | POA: Diagnosis not present

## 2019-12-05 DIAGNOSIS — F1120 Opioid dependence, uncomplicated: Secondary | ICD-10-CM | POA: Diagnosis not present

## 2019-12-09 DIAGNOSIS — F1120 Opioid dependence, uncomplicated: Secondary | ICD-10-CM | POA: Diagnosis not present

## 2019-12-09 DIAGNOSIS — G4700 Insomnia, unspecified: Secondary | ICD-10-CM | POA: Diagnosis not present

## 2019-12-16 DIAGNOSIS — F1120 Opioid dependence, uncomplicated: Secondary | ICD-10-CM | POA: Diagnosis not present

## 2019-12-16 DIAGNOSIS — G4700 Insomnia, unspecified: Secondary | ICD-10-CM | POA: Diagnosis not present

## 2019-12-23 DIAGNOSIS — F1120 Opioid dependence, uncomplicated: Secondary | ICD-10-CM | POA: Diagnosis not present

## 2019-12-23 DIAGNOSIS — G4700 Insomnia, unspecified: Secondary | ICD-10-CM | POA: Diagnosis not present

## 2019-12-27 ENCOUNTER — Other Ambulatory Visit: Payer: Medicare HMO

## 2019-12-30 DIAGNOSIS — G4700 Insomnia, unspecified: Secondary | ICD-10-CM | POA: Diagnosis not present

## 2019-12-30 DIAGNOSIS — F1120 Opioid dependence, uncomplicated: Secondary | ICD-10-CM | POA: Diagnosis not present

## 2020-01-06 DIAGNOSIS — F1120 Opioid dependence, uncomplicated: Secondary | ICD-10-CM | POA: Diagnosis not present

## 2020-01-06 DIAGNOSIS — G4700 Insomnia, unspecified: Secondary | ICD-10-CM | POA: Diagnosis not present

## 2020-01-09 DIAGNOSIS — F1120 Opioid dependence, uncomplicated: Secondary | ICD-10-CM | POA: Diagnosis not present

## 2020-01-09 DIAGNOSIS — G4700 Insomnia, unspecified: Secondary | ICD-10-CM | POA: Diagnosis not present

## 2020-01-13 ENCOUNTER — Telehealth: Payer: Self-pay | Admitting: Family Medicine

## 2020-01-13 DIAGNOSIS — M2550 Pain in unspecified joint: Secondary | ICD-10-CM

## 2020-01-13 NOTE — Telephone Encounter (Signed)
Patient was last seen 09/05/20 is it ok to put in a referral or do patient need an office visit

## 2020-01-13 NOTE — Telephone Encounter (Signed)
Pt called would like provider to refer him to an arthritis specialist for elbow and wrists and now in right knee. Please advise

## 2020-01-13 NOTE — Telephone Encounter (Signed)
I have ordered basic labs to evaluate if joint pain is related to a rheumatological condition. These will be needed before rheum agrees to see patient. thanks

## 2020-01-14 NOTE — Telephone Encounter (Signed)
LVM for pt to let him know that he needs to come to the office and have labs drawn before the rheumatologist will see him.

## 2020-01-17 ENCOUNTER — Other Ambulatory Visit: Payer: Self-pay

## 2020-01-17 ENCOUNTER — Ambulatory Visit (INDEPENDENT_AMBULATORY_CARE_PROVIDER_SITE_OTHER): Payer: Medicare HMO | Admitting: Family Medicine

## 2020-01-17 DIAGNOSIS — M2550 Pain in unspecified joint: Secondary | ICD-10-CM | POA: Diagnosis not present

## 2020-01-17 DIAGNOSIS — R768 Other specified abnormal immunological findings in serum: Secondary | ICD-10-CM

## 2020-01-18 LAB — URIC ACID: Uric Acid: 4.2 mg/dL (ref 3.8–8.4)

## 2020-01-18 LAB — RHEUMATOID FACTOR: Rheumatoid fact SerPl-aCnc: >650 IU/mL — ABNORMAL HIGH (ref 0.0–13.9)

## 2020-01-18 LAB — ANA W/RFX TO ALL IF POSITIVE: Anti Nuclear Antibody (ANA): NEGATIVE

## 2020-01-18 LAB — SEDIMENTATION RATE: Sed Rate: 73 mm/hr — ABNORMAL HIGH (ref 0–30)

## 2020-01-18 LAB — C-REACTIVE PROTEIN: CRP: 35 mg/L — ABNORMAL HIGH (ref 0–10)

## 2020-01-23 DIAGNOSIS — F1120 Opioid dependence, uncomplicated: Secondary | ICD-10-CM | POA: Diagnosis not present

## 2020-01-23 DIAGNOSIS — G4700 Insomnia, unspecified: Secondary | ICD-10-CM | POA: Diagnosis not present

## 2020-01-25 NOTE — Telephone Encounter (Signed)
Pt did have his blood drawn on 01/17/20. He would like a referral for a rheumatologist. Please advise at  [phone]

## 2020-01-25 NOTE — Telephone Encounter (Signed)
Is it ok to place referral since he had his labs drawn on 2/15.2021.  Please Advise.

## 2020-01-26 NOTE — Telephone Encounter (Signed)
Please inform patient that referral has been made. thanks

## 2020-01-26 NOTE — Telephone Encounter (Signed)
Lvm for patient letting him know that the referral was sent

## 2020-01-27 ENCOUNTER — Telehealth: Payer: Self-pay | Admitting: Family Medicine

## 2020-01-27 NOTE — Telephone Encounter (Signed)
Patient is wanting Dr.Santiago to put in another refferal for him / the appt wit Dr.Deveshar [facility]  Rheumatology cant see him until  Late May. Patient feels He cant wait that long. Maybe provider at [place] or [place] ?

## 2020-01-28 NOTE — Telephone Encounter (Signed)
I can use the same referral and no not need a new one.  I have sent referral info to [facility], Dr Trudie Reed

## 2020-02-06 DIAGNOSIS — F1120 Opioid dependence, uncomplicated: Secondary | ICD-10-CM | POA: Diagnosis not present

## 2020-02-06 DIAGNOSIS — G4700 Insomnia, unspecified: Secondary | ICD-10-CM | POA: Diagnosis not present

## 2020-02-08 DIAGNOSIS — R5383 Other fatigue: Secondary | ICD-10-CM | POA: Diagnosis not present

## 2020-02-08 DIAGNOSIS — Z681 Body mass index (BMI) 19 or less, adult: Secondary | ICD-10-CM | POA: Diagnosis not present

## 2020-02-08 DIAGNOSIS — M2550 Pain in unspecified joint: Secondary | ICD-10-CM | POA: Diagnosis not present

## 2020-02-08 DIAGNOSIS — M0579 Rheumatoid arthritis with rheumatoid factor of multiple sites without organ or systems involvement: Secondary | ICD-10-CM | POA: Diagnosis not present

## 2020-02-08 DIAGNOSIS — R768 Other specified abnormal immunological findings in serum: Secondary | ICD-10-CM | POA: Diagnosis not present

## 2020-02-12 ENCOUNTER — Emergency Department (HOSPITAL_BASED_OUTPATIENT_CLINIC_OR_DEPARTMENT_OTHER)
Admission: EM | Admit: 2020-02-12 | Discharge: 2020-02-12 | Disposition: A | Discharge status: Home / Self Care | Payer: Medicare HMO | Attending: Emergency Medicine | Admitting: Emergency Medicine

## 2020-02-12 ENCOUNTER — Encounter (HOSPITAL_BASED_OUTPATIENT_CLINIC_OR_DEPARTMENT_OTHER): Payer: Self-pay | Admitting: *Deleted

## 2020-02-12 ENCOUNTER — Other Ambulatory Visit: Payer: Self-pay

## 2020-02-12 ENCOUNTER — Emergency Department (HOSPITAL_BASED_OUTPATIENT_CLINIC_OR_DEPARTMENT_OTHER): Payer: Medicare HMO

## 2020-02-12 DIAGNOSIS — K5900 Constipation, unspecified: Secondary | ICD-10-CM

## 2020-02-12 DIAGNOSIS — G8929 Other chronic pain: Secondary | ICD-10-CM | POA: Insufficient documentation

## 2020-02-12 DIAGNOSIS — Z91041 Radiographic dye allergy status: Secondary | ICD-10-CM | POA: Diagnosis not present

## 2020-02-12 DIAGNOSIS — G40909 Epilepsy, unspecified, not intractable, without status epilepticus: Secondary | ICD-10-CM | POA: Insufficient documentation

## 2020-02-12 DIAGNOSIS — F17210 Nicotine dependence, cigarettes, uncomplicated: Secondary | ICD-10-CM | POA: Insufficient documentation

## 2020-02-12 DIAGNOSIS — R1084 Generalized abdominal pain: Secondary | ICD-10-CM

## 2020-02-12 DIAGNOSIS — R1012 Left upper quadrant pain: Secondary | ICD-10-CM | POA: Diagnosis not present

## 2020-02-12 DIAGNOSIS — K219 Gastro-esophageal reflux disease without esophagitis: Secondary | ICD-10-CM

## 2020-02-12 DIAGNOSIS — J449 Chronic obstructive pulmonary disease, unspecified: Secondary | ICD-10-CM | POA: Diagnosis not present

## 2020-02-12 DIAGNOSIS — Z79899 Other long term (current) drug therapy: Secondary | ICD-10-CM | POA: Diagnosis not present

## 2020-02-12 DIAGNOSIS — Z888 Allergy status to other drugs, medicaments and biological substances status: Secondary | ICD-10-CM | POA: Diagnosis not present

## 2020-02-12 LAB — URINALYSIS, ROUTINE W REFLEX MICROSCOPIC
Bilirubin Urine: NEGATIVE
Glucose, UA: NEGATIVE mg/dL
Hgb urine dipstick: NEGATIVE
Ketones, ur: NEGATIVE mg/dL
Leukocytes,Ua: NEGATIVE
Nitrite: NEGATIVE
Protein, ur: NEGATIVE mg/dL
Specific Gravity, Urine: 1.02 (ref 1.005–1.030)
pH: 8.5 — ABNORMAL HIGH (ref 5.0–8.0)

## 2020-02-12 LAB — CBC WITH DIFFERENTIAL/PLATELET
Abs Immature Granulocytes: 0.06 10*3/uL (ref 0.00–0.07)
Basophils Absolute: 0.1 10*3/uL (ref 0.0–0.1)
Basophils Relative: 0 %
Eosinophils Absolute: 0.2 10*3/uL (ref 0.0–0.5)
Eosinophils Relative: 1 %
HCT: 42.9 % (ref 39.0–52.0)
Hemoglobin: 13.9 g/dL (ref 13.0–17.0)
Immature Granulocytes: 0 %
Lymphocytes Relative: 31 %
Lymphs Abs: 4.9 10*3/uL — ABNORMAL HIGH (ref 0.7–4.0)
MCH: 29.6 pg (ref 26.0–34.0)
MCHC: 32.4 g/dL (ref 30.0–36.0)
MCV: 91.5 fL (ref 80.0–100.0)
Monocytes Absolute: 1.4 10*3/uL — ABNORMAL HIGH (ref 0.1–1.0)
Monocytes Relative: 9 %
Neutro Abs: 9.3 10*3/uL — ABNORMAL HIGH (ref 1.7–7.7)
Neutrophils Relative %: 59 %
Platelets: 276 10*3/uL (ref 150–400)
RBC: 4.69 MIL/uL (ref 4.22–5.81)
RDW: 13.3 % (ref 11.5–15.5)
WBC: 15.9 10*3/uL — ABNORMAL HIGH (ref 4.0–10.5)
nRBC: 0 % (ref 0.0–0.2)

## 2020-02-12 LAB — COMPREHENSIVE METABOLIC PANEL
ALT: 11 U/L (ref 0–44)
AST: 16 U/L (ref 15–41)
Albumin: 3.8 g/dL (ref 3.5–5.0)
Alkaline Phosphatase: 68 U/L (ref 38–126)
Anion gap: 9 (ref 5–15)
BUN: 13 mg/dL (ref 6–20)
CO2: 31 mmol/L (ref 22–32)
Calcium: 10.1 mg/dL (ref 8.9–10.3)
Chloride: 97 mmol/L — ABNORMAL LOW (ref 98–111)
Creatinine, Ser: 0.76 mg/dL (ref 0.61–1.24)
GFR calc Af Amer: >60 mL/min (ref >60)
GFR calc non Af Amer: >60 mL/min (ref >60)
Glucose, Bld: 97 mg/dL (ref 70–99)
Potassium: 3.2 mmol/L — ABNORMAL LOW (ref 3.5–5.1)
Sodium: 137 mmol/L (ref 135–145)
Total Bilirubin: 0.2 mg/dL — ABNORMAL LOW (ref 0.3–1.2)
Total Protein: 7.8 g/dL (ref 6.5–8.1)

## 2020-02-12 LAB — LIPASE, BLOOD: Lipase: 27 U/L (ref 11–51)

## 2020-02-12 MED ORDER — LIDOCAINE VISCOUS HCL 2 % MT SOLN
15.0000 mL | Freq: Once | OROMUCOSAL | Status: AC
  Administered 2020-02-12: 15 mL via ORAL
  Filled 2020-02-12: qty 15

## 2020-02-12 MED ORDER — ALUM & MAG HYDROXIDE-SIMETH 200-200-20 MG/5ML PO SUSP
30.0000 mL | Freq: Once | ORAL | Status: AC
  Administered 2020-02-12: 30 mL via ORAL
  Filled 2020-02-12: qty 30

## 2020-02-12 MED ORDER — OMEPRAZOLE 20 MG PO CPDR: 20.0000 mg | DELAYED_RELEASE_CAPSULE | Freq: Every day | ORAL | 0 refills | Status: DC

## 2020-02-12 MED ORDER — POLYETHYLENE GLYCOL 3350 17 G PO PACK: 17.0000 g | PACK | Freq: Every day | ORAL | 0 refills | Status: DC

## 2020-02-12 MED ORDER — FAMOTIDINE IN NACL 20-0.9 MG/50ML-% IV SOLN
20.0000 mg | Freq: Once | INTRAVENOUS | Status: AC
  Administered 2020-02-12: 20 mg via INTRAVENOUS
  Filled 2020-02-12: qty 50

## 2020-02-12 MED ORDER — SODIUM CHLORIDE 0.9 % IV BOLUS
1000.0000 mL | Freq: Once | INTRAVENOUS | Status: AC
  Administered 2020-02-12: 10:00:00 1000 mL via INTRAVENOUS

## 2020-02-12 NOTE — Discharge Instructions (Addendum)
Stop prednisone

## 2020-02-12 NOTE — ED Provider Notes (Signed)
[facility] EMERGENCY DEPARTMENT Provider Note   CSN: [id number] Arrival date & time: 02/12/20  [id number]     History Chief Complaint  Patient presents with  . Abdominal Pain    Jerry Knight is a 59 y.o. male.  Pt presents to the ED today with abdominal pain.  Pt said pain started 2 days ago.  Pt has a hx of chronic pain and has had several abdominal surgeries.  He had some nausea last night, but has been having bowel movements.  Pt does take Suboxone and took a strip last night.  He did get put on prednisone by the rheumatologist about 3 days ago.          Past Medical History:  Diagnosis Date  . Blood transfusion without reported diagnosis   . Chest pain   . Chronic neck pain   . Chronic pain   . Chronic pancreatitis ([place])   . COPD (chronic obstructive pulmonary disease) ([place])   . Degenerative disk disease    C 5 &C 6  . Depression   . GI bleed   . Gout   . Migraine   . Migraine headache   . Pain management contract agreement   . Seizure disorder ([place])    x 7 years  . Seizures ([place])   . 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 Zollinger-Ellison syndrome     Patient Active Problem List   Diagnosis Date Noted  . Opioid dependence on maintenance agonist therapy, no symptoms ([place]) 02/01/2019  . Loss of weight 01/22/2013  . Hypokalemia 01/22/2013  . Substance abuse ([place]) 01/22/2013  . Pancreatitis, chronic ([place]) 08/27/2012  . Abdominal pain, epigastric 08/27/2012  . Chest pain 01/26/2012  . SOB (shortness of breath) 01/26/2012    Past Surgical History:  Procedure Laterality Date  . APPENDECTOMY     per CT 2012- s/p appendectomy  . CHOLECYSTECTOMY    . EYE SURGERY    . HERNIA REPAIR    . PARTIAL GASTRECTOMY    . SMALL INTESTINE SURGERY         Family History  Problem Relation Age of Onset  . Angina Mother   . Nephrolithiasis Father   . Cancer Brother     Social History   Tobacco Use  . Smoking status: Current Every  Day Smoker    Packs/day: 2.00    Types: Cigarettes  . Smokeless tobacco: Never Used  . Tobacco comment: about 2 packs a day  Substance Use Topics  . Alcohol use: No  . Drug use: Yes    Types: Marijuana, Other-see comments    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    Home Medications Prior to Admission medications   Medication Sig Start Date End Date Taking? Authorizing Provider  atorvastatin (LIPITOR) 10 MG tablet Take 1 tablet (10 mg total) by mouth daily. 05/27/19   Rutherford Guys, MD  buprenorphine-naloxone (SUBOXONE) 8-2 mg SUBL SL tablet Place 1 tablet under the tongue daily.    [provider]  colchicine 0.6 MG tablet Take 1 tablet (0.6 mg total) by mouth 2 (two) times daily as needed (at onset of gout flare up). 05/27/19   Rutherford Guys, MD  cyclobenzaprine (FLEXERIL) 10 MG tablet TK 1 T PO Q 8 H 12/31/18   [provider]  mirtazapine (REMERON) 45 MG tablet Take 45 mg by mouth at bedtime.    [provider]  omeprazole (PRILOSEC) 20 MG capsule Take 1 capsule (20 mg total) by mouth daily. 02/12/20   Isla Pence, MD  polyethylene glycol (MIRALAX) 17 g packet Take 17 g by mouth daily. 02/12/20   Isla Pence, MD  traZODone (DESYREL) 50 MG tablet Take 100 mg by mouth at bedtime.     [provider]  Vitamin D, Ergocalciferol, (DRISDOL) 1.25 MG (50000 UT) CAPS capsule Take 1 capsule (50,000 Units total) by mouth every 7 (seven) days. 05/07/19   Rutherford Guys, MD    Allergies    Ambien [zolpidem tartrate], Iohexol, Seroquel [quetiapine fumarate], and Sonata [zaleplon]  Review of Systems   Review of Systems  Gastrointestinal: Positive for abdominal pain.  All other systems reviewed and are negative.   Physical Exam Updated Vital Signs BP 129/87 (BP Location: Right Arm)   Pulse 80   Temp (!) 97.5 F (36.4 C) (Oral)   Resp 18   Ht 5\' 5"  (1.651 m)   Wt 46.3 kg   SpO2 99%   BMI 16.97 kg/m   Physical Exam Vitals and  nursing note reviewed.  Constitutional:      Appearance: He is underweight.  HENT:     Head: Normocephalic and atraumatic.     Mouth/Throat:     Mouth: Mucous membranes are moist.     Pharynx: Oropharynx is clear.  Eyes:     Extraocular Movements: Extraocular movements intact.     Pupils: Pupils are equal, round, and reactive to light.  Cardiovascular:     Rate and Rhythm: Normal rate and regular rhythm.  Pulmonary:     Effort: Pulmonary effort is normal.     Breath sounds: Normal breath sounds.  Abdominal:     General: Abdomen is flat. Bowel sounds are normal.     Palpations: Abdomen is soft.     Tenderness: There is abdominal tenderness in the left upper quadrant.  Skin:    General: Skin is warm.     Capillary Refill: Capillary refill takes less than 2 seconds.  Neurological:     General: No focal deficit present.     Mental Status: He is alert and oriented to person, place, and time.  Psychiatric:        Mood and Affect: Mood normal.        Behavior: Behavior normal.     ED Results / Procedures / Treatments   Labs (all labs ordered are listed, but only abnormal results are displayed) Labs Reviewed  CBC WITH DIFFERENTIAL/PLATELET - Abnormal; Notable for the following components:      Result Value   WBC 15.9 (*)    Neutro Abs 9.3 (*)    Lymphs Abs 4.9 (*)    Monocytes Absolute 1.4 (*)    All other components within normal limits  COMPREHENSIVE METABOLIC PANEL - Abnormal; Notable for the following components:   Potassium 3.2 (*)    Chloride 97 (*)    Total Bilirubin 0.2 (*)    All other components within normal limits  URINALYSIS, ROUTINE W REFLEX MICROSCOPIC - Abnormal; Notable for the following components:   pH 8.5 (*)    All other components within normal limits  LIPASE, BLOOD    EKG None  Radiology DG Abdomen Acute W/Chest  Result Date: 02/12/2020 CLINICAL DATA:  C/o painful "bulge" in LUQ w/nausea and vomiting since yesterday. Hx cholecystectomy,  appendectomy, splenectomy, partial gastrectomy, intestinal surgery, chronic pancreatitis, stomach ulcers, smoker, COPD EXAM: DG ABDOMEN ACUTE W/ 1V CHEST COMPARISON:  CT,  07/14/2017. FINDINGS: There is no bowel dilation to suggest obstruction. There are no air-fluid levels. No evidence of adynamic ileus and no free air. Surgical anastomosis staples lie in the left mid and upper abdomen. There is moderate increased stool in the colon. Curved calcification projects within the right kidney, corresponding to a parenchymal calcification on the prior CT. No evidence of a ureteral stone. Soft tissues are otherwise unremarkable. Heart, mediastinum and hila are unremarkable.  Lungs are clear. No acute or significant skeletal abnormality. IMPRESSION: 1. No acute findings. No evidence of bowel obstruction, generalized adynamic ileus or free air. 2. Moderate increased colonic stool burden. 3. No acute cardiopulmonary disease. Electronically Signed   By: Lajean Manes M.D.   On: 02/12/2020 10:15    Procedures Procedures (including critical care time)  Medications Ordered in ED Medications  famotidine (PEPCID) IVPB 20 mg premix (20 mg Intravenous New Bag/Given 02/12/20 1015)  alum & mag hydroxide-simeth (MAALOX/MYLANTA) 200-200-20 MG/5ML suspension 30 mL (has no administration in time range)    And  lidocaine (XYLOCAINE) 2 % viscous mouth solution 15 mL (has no administration in time range)  sodium chloride 0.9 % bolus 1,000 mL (1,000 mLs Intravenous New Bag/Given 02/12/20 1013)    ED Course  I have reviewed the triage vital signs and the nursing notes.  Pertinent labs & imaging results that were available during my care of the patient were reviewed by me and considered in my medical decision making (see chart for details).    MDM Rules/Calculators/A&P                      Pt said he's been out of his Omeprazole.  This will be refilled.  Sx likely gastritis due to prednisone and no omeprazole.  WBC is  elevated, but that is likely from prednisone.  No fever.  Pt is told to stop prednisone and is given a refill of omeprazole.  He is also constipated, so is given a rx for miralax.  He is in the process of getting established with GI at [facility].  Return if worse.  Final Clinical Impression(s) / ED Diagnoses Final diagnoses:  Generalized abdominal pain  Gastroesophageal reflux disease without esophagitis  Constipation, unspecified constipation type    Rx / DC Orders ED Discharge Orders         Ordered    omeprazole (PRILOSEC) 20 MG capsule  Daily     02/12/20 1027    polyethylene glycol (MIRALAX) 17 g packet  Daily     02/12/20 1027           Isla Pence, MD 02/12/20 1042

## 2020-02-12 NOTE — ED Triage Notes (Addendum)
Presents with abd pain, onset 2 days ago, pain is more focused to LUQ, no diarrhea, states had vomiting yesterday

## 2020-02-24 DIAGNOSIS — R768 Other specified abnormal immunological findings in serum: Secondary | ICD-10-CM | POA: Diagnosis not present

## 2020-02-24 DIAGNOSIS — M2550 Pain in unspecified joint: Secondary | ICD-10-CM | POA: Diagnosis not present

## 2020-02-24 DIAGNOSIS — M0579 Rheumatoid arthritis with rheumatoid factor of multiple sites without organ or systems involvement: Secondary | ICD-10-CM | POA: Diagnosis not present

## 2020-02-24 DIAGNOSIS — Z681 Body mass index (BMI) 19 or less, adult: Secondary | ICD-10-CM | POA: Diagnosis not present

## 2020-03-02 DIAGNOSIS — G4700 Insomnia, unspecified: Secondary | ICD-10-CM | POA: Diagnosis not present

## 2020-03-02 DIAGNOSIS — F1120 Opioid dependence, uncomplicated: Secondary | ICD-10-CM | POA: Diagnosis not present

## 2020-03-26 DIAGNOSIS — F1120 Opioid dependence, uncomplicated: Secondary | ICD-10-CM | POA: Diagnosis not present

## 2020-03-26 DIAGNOSIS — G4700 Insomnia, unspecified: Secondary | ICD-10-CM | POA: Diagnosis not present

## 2020-04-19 ENCOUNTER — Other Ambulatory Visit: Payer: Self-pay | Admitting: Family Medicine

## 2020-04-19 MED ORDER — OMEPRAZOLE 20 MG PO CPDR: 20.0000 mg | DELAYED_RELEASE_CAPSULE | Freq: Every day | ORAL | 0 refills | Status: DC

## 2020-04-19 NOTE — Telephone Encounter (Signed)
Copied from [place] [id number]. Topic: Quick Communication - Rx Refill/Question >> Apr 19, 2020  9:36 AM Rainey Pines A wrote: Medication: omeprazole (PRILOSEC) 45 MG capsule  Has the patient contacted their pharmacy? yes (Agent: If no, request that the patient contact the pharmacy for the refill.) (Agent: If yes, when and what did the pharmacy advise?)contact pcp  Preferred Pharmacy (with phone number or street name): [id number], [place] - [address]  Phone:  [phone] Fax:  [phone]     Agent: Please be advised that RX refills may take up to 3 business days. We ask that you follow-up with your pharmacy.

## 2020-04-19 NOTE — Telephone Encounter (Signed)
Requested medication (s) are due for refill today- yes  Requested medication (s) are on the active medication list -yes  Future visit scheduled -yes  Last refill: 2 months ago- ED visit  Notes to clinic: Rx RF request for mediation prescribed by outside provider  Requested Prescriptions  Pending Prescriptions Disp Refills   omeprazole (PRILOSEC) 20 MG capsule 30 capsule 0    Sig: Take 1 capsule (20 mg total) by mouth daily.      Gastroenterology: Proton Pump Inhibitors Passed - 04/19/2020  9:38 AM      Passed - Valid encounter within last 12 months    Recent Outpatient Visits           7 months ago Arthralgia of both elbows   Primary Care at [facility], Lilia Argue, MD   10 months ago Arthralgia of both elbows   [facility] at [facility], Lilia Argue, MD   11 months ago Vitamin D deficiency   [facility] Care at [facility], Lilia Argue, MD   1 year ago Hypercalcemia   [facility] Care at [facility], Lilia Argue, MD   1 year ago Annual physical exam   [facility] Care at [facility], Lilia Argue, MD       Future Appointments             In 2 days Rutherford Guys, MD Primary Care at [place], [facility]                Requested Prescriptions  Pending Prescriptions Disp Refills   omeprazole (PRILOSEC) 20 MG capsule 30 capsule 0    Sig: Take 1 capsule (20 mg total) by mouth daily.      Gastroenterology: Proton Pump Inhibitors Passed - 04/19/2020  9:38 AM      Passed - Valid encounter within last 12 months    Recent Outpatient Visits           7 months ago Arthralgia of both elbows   [facility] Care at [facility], Lilia Argue, MD   10 months ago Arthralgia of both elbows   [facility] at [facility], Lilia Argue, MD   11 months ago Vitamin D deficiency   [facility] Care at [facility], Lilia Argue, MD   1 year ago Hypercalcemia   [facility] at [facility], Lilia Argue, MD   1 year ago Annual physical exam   [facility] at [facility], Lilia Argue, MD       Future Appointments              In 2 days Rutherford Guys, MD Primary Care at [place], [facility]

## 2020-04-21 ENCOUNTER — Other Ambulatory Visit: Payer: Self-pay

## 2020-04-21 ENCOUNTER — Telehealth (INDEPENDENT_AMBULATORY_CARE_PROVIDER_SITE_OTHER): Payer: Medicare HMO | Admitting: Family Medicine

## 2020-04-21 ENCOUNTER — Encounter: Payer: Self-pay | Admitting: Family Medicine

## 2020-04-21 VITALS — Ht 65.0 in | Wt 112.0 lb

## 2020-04-21 DIAGNOSIS — G8929 Other chronic pain: Secondary | ICD-10-CM | POA: Diagnosis not present

## 2020-04-21 DIAGNOSIS — M542 Cervicalgia: Secondary | ICD-10-CM

## 2020-04-21 DIAGNOSIS — M549 Dorsalgia, unspecified: Secondary | ICD-10-CM

## 2020-04-21 DIAGNOSIS — J324 Chronic pansinusitis: Secondary | ICD-10-CM | POA: Diagnosis not present

## 2020-04-21 DIAGNOSIS — K5900 Constipation, unspecified: Secondary | ICD-10-CM | POA: Diagnosis not present

## 2020-04-21 DIAGNOSIS — F5104 Psychophysiologic insomnia: Secondary | ICD-10-CM | POA: Insufficient documentation

## 2020-04-21 MED ORDER — MIRTAZAPINE 45 MG PO TABS: 45.0000 mg | ORAL_TABLET | Freq: Every day | ORAL | 1 refills | Status: DC

## 2020-04-21 MED ORDER — TRAZODONE HCL 50 MG PO TABS: 150.0000 mg | ORAL_TABLET | Freq: Every day | ORAL | 5 refills | Status: DC

## 2020-04-21 MED ORDER — AMOXICILLIN-POT CLAVULANATE 875-125 MG PO TABS: 1.0000 | ORAL_TABLET | Freq: Two times a day (BID) | ORAL | 0 refills | Status: DC

## 2020-04-21 MED ORDER — LINACLOTIDE 145 MCG PO CAPS: 145.0000 ug | ORAL_CAPSULE | Freq: Every day | ORAL | 5 refills | Status: DC

## 2020-04-21 MED ORDER — CYCLOBENZAPRINE HCL 10 MG PO TABS: 10.0000 mg | ORAL_TABLET | Freq: Three times a day (TID) | ORAL | 5 refills | Status: DC

## 2020-04-21 NOTE — Patient Instructions (Signed)
° ° ° °  If you have lab work done today you will be contacted with your lab results within the next 2 weeks.  If you have not heard from us then please contact us. The fastest way to get your results is to register for My Chart. ° ° °IF you received an x-ray today, you will receive an invoice from [facility]. Please contact [facility] at [phone] with questions or concerns regarding your invoice.  ° °IF you received labwork today, you will receive an invoice from [facility]. Please contact [facility] at [phone] with questions or concerns regarding your invoice.  ° °Our billing staff will not be able to assist you with questions regarding bills from these companies. ° °You will be contacted with the lab results as soon as they are available. The fastest way to get your results is to activate your My Chart account. Instructions are located on the last page of this paperwork. If you have not heard from us regarding the results in 2 weeks, please contact this office. °  ° ° ° °

## 2020-04-21 NOTE — Progress Notes (Signed)
Virtual Visit Note  I connected with patient on 04/21/20 at 456pm by phone (unable to connect via video) and verified that I am speaking with the correct person using two identifiers. Jerry Knight is currently located at home and patient is currently with them during visit. The provider, Rutherford Guys, MD is located in their office at time of visit.  I discussed the limitations, risks, security and privacy concerns of performing an evaluation and management service by telephone and the availability of in person appointments. I also discussed with the patient that there may be a patient responsible charge related to this service. The patient expressed understanding and agreed to proceed.   I provided 8 minutes of non-face-to-face time during this encounter.  Chief Complaint  Patient presents with  . Sinus Problem    2 surgeries in the past, feels like the right side of nostril needs to be cleaned out again. Asking for referral for for ENT in [facility] with Dr. Wynelle Bourgeois (could not find any info on this doctor). Using mucinex for his sx. Pt says he is also a smoker, smoking 2 pk a day    HPI ? 7 or 8 years ago had sinus surgery with Dr Wynelle Bourgeois Has had done several years Having throbbing pressure behind right eye and right cheek for past several months, worsening of recent Not much is coming out mucinex not helping Also doing nasal saline rinses Denies any allergy symptoms Chronic loss of taste or smell Denies any fever or chills His father recently passed away and his mother has had hip replacement  [facility] Has been able to wean off suboxone  He was being prescribed remeron, trazadone, flexeril and linzess 170mcg daily He reports remeron 45mg  and trazadone 150mg  with 50mg  prn are for sleep Flexeril are for back and neck pain, 10mg  BID Reports he is doing really well on this regime  Rheum started him on embrel for RA    Allergies  Allergen Reactions  . Ambien [Zolpidem Tartrate] Other (See Comments)    Black out  . Iohexol     Pt claims he has chest tightness and sob after iv contrast injections.  . Seroquel [Quetiapine Fumarate]     Blackout  . Sonata [Zaleplon]     "black out'  confused    Prior to Admission medications   Medication Sig Start Date End Date Taking? Authorizing Provider  atorvastatin (LIPITOR) 10 MG tablet Take 1 tablet (10 mg total) by mouth daily. 05/27/19   Rutherford Guys, MD  buprenorphine-naloxone (SUBOXONE) 8-2 mg SUBL SL tablet Place 1 tablet under the tongue daily.    [provider]  colchicine 0.6 MG tablet Take 1 tablet (0.6 mg total) by mouth 2 (two) times daily as needed (at onset of gout flare up). 05/27/19   Rutherford Guys, MD  cyclobenzaprine (FLEXERIL) 10 MG tablet TK 1 T PO Q 8 H 12/31/18   [provider]  mirtazapine (REMERON) 45 MG tablet Take 45 mg by mouth at bedtime.    [provider]  omeprazole (PRILOSEC) 20 MG capsule Take 1 capsule (20 mg total) by mouth daily. 04/19/20   Rutherford Guys, MD  polyethylene glycol (MIRALAX) 17 g packet Take 17 g by mouth daily. 02/12/20   Isla Pence, MD  traZODone (DESYREL) 50 MG tablet Take 100 mg by mouth at bedtime.     [provider]  Vitamin D, Ergocalciferol, (  DRISDOL) 1.25 MG (50000 UT) CAPS capsule Take 1 capsule (50,000 Units total) by mouth every 7 (seven) days. 05/07/19   Rutherford Guys, MD    Past Medical History:  Diagnosis Date  . Blood transfusion without reported diagnosis   . Chest pain   . Chronic neck pain   . Chronic pain   . Chronic pancreatitis ([place])   . COPD (chronic obstructive pulmonary disease) ([place])   . Degenerative disk disease    C 5 &C 6  . Depression   . GI bleed   . Gout   . Migraine   . Migraine headache   . Pain management contract agreement   . Seizure disorder ([place])    x 7 years  . Seizures ([place])   . Substance abuse ([place])    Has been  cleaned for 18 mos  . Zollinger-Ellison syndrome     Past Surgical History:  Procedure Laterality Date  . APPENDECTOMY     per CT 2012- s/p appendectomy  . CHOLECYSTECTOMY    . EYE SURGERY    . HERNIA REPAIR    . PARTIAL GASTRECTOMY    . SMALL INTESTINE SURGERY      Social History   Tobacco Use  . Smoking status: Current Every Day Smoker    Packs/day: 2.00    Types: Cigarettes  . Smokeless tobacco: Never Used  . Tobacco comment: about 2 packs a day  Substance Use Topics  . Alcohol use: No    Family History  Problem Relation Age of Onset  . Angina Mother   . Nephrolithiasis Father   . Cancer Brother     ROS Per hpi  Objective  Vitals as reported by the patient: none   ASSESSMENT and PLAN  1. Chronic pansinusitis rx for augmentin given recent worsening. Referring to ENT as requested.  - Ambulatory referral to ENT  2. Constipation, unspecified constipation type Controlled. Continue current regime.   3. Chronic neck and back pain Controlled. Continue current regime.   4. Psychophysiological insomnia Controlled. Continue current regime.   Other orders - amoxicillin-clavulanate (AUGMENTIN) 875-125 MG tablet; Take 1 tablet by mouth 2 (two) times daily. - cyclobenzaprine (FLEXERIL) 10 MG tablet; Take 1 tablet (10 mg total) by mouth 3 (three) times daily. - mirtazapine (REMERON) 45 MG tablet; Take 1 tablet (45 mg total) by mouth at bedtime. - traZODone (DESYREL) 50 MG tablet; Take 3-4 tablets (150-200 mg total) by mouth at bedtime. - linaclotide (LINZESS) 145 MCG CAPS capsule; Take 1 capsule (145 mcg total) by mouth daily before breakfast.  FOLLOW-UP: 6 months   The above assessment and management plan was discussed with the patient. The patient verbalized understanding of and has agreed to the management plan. Patient is aware to call the clinic if symptoms persist or worsen. Patient is aware when to return to the clinic for a follow-up visit. Patient  educated on when it is appropriate to go to the emergency department.     Rutherford Guys, MD Primary Care at [place], [place] [address] Ph.  [phone] Fax [phone]

## 2020-05-03 ENCOUNTER — Ambulatory Visit (INDEPENDENT_AMBULATORY_CARE_PROVIDER_SITE_OTHER): Payer: Medicare HMO | Admitting: Emergency Medicine

## 2020-05-03 VITALS — BP 129/87 | Ht 65.0 in | Wt 112.0 lb

## 2020-05-03 DIAGNOSIS — Z0000 Encounter for general adult medical examination without abnormal findings: Secondary | ICD-10-CM | POA: Diagnosis not present

## 2020-05-03 NOTE — Progress Notes (Signed)
Presents today for [employer] Visit   Date of last exam: 04-21-2020  Interpreter used for this visit?  No  I connected with  Yevonne Pax on 05/03/20 by a telephone  and verified that I am speaking with the correct person using two identifiers.   I discussed the limitations of evaluation and management by telemedicine. The patient expressed understanding and agreed to proceed.    Patient Care Team: Rutherford Guys, MD as PCP - General (Family Medicine)   Other items to address today:   Discussed Eye/Dental Discussed immunizations   Other Screening: Last screening for diabetes: 02/12/2020  Last lipid screening: 09/06/2019  ADVANCE DIRECTIVES: Discussed: yes On File: yes Materials Provided: no  Immunization status:  Immunization History  Administered Date(s) Administered  . Influenza,inj,Quad PF,6+ Mos 09/06/2019  . PFIZER SARS-COV-2 Vaccination 01/21/2020, 02/18/2020     Health Maintenance Due  Topic Date Due  . TETANUS/TDAP  Never done     Functional Status Survey: Is the patient deaf or have difficulty hearing?: Yes(patient states he has hearing aids that he is getting readjusted for his ears.) Does the patient have difficulty seeing, even when wearing glasses/contacts?: No Does the patient have difficulty concentrating, remembering, or making decisions?: No Does the patient have difficulty walking or climbing stairs?: No Does the patient have difficulty dressing or bathing?: No Does the patient have difficulty doing errands alone such as visiting a doctor's office or shopping?: No   6CIT Screen 05/03/2020 03/31/2019  What Year? 0 points 0 points  What month? 0 points 0 points  What time? 0 points 0 points  Count back from 20 0 points 0 points  Months in reverse 0 points 0 points  Repeat phrase 0 points 0 points  Total Score 0 0        Clinical Support from 05/03/2020 in [facility]  AUDIT-C Score  0       Home  Environment:    No difficulty climbing stairs Lives in one story home Yes grab bars No scattered rugs Adequate lighting/ no clutter   Patient Active Problem List   Diagnosis Date Noted  . Constipation 04/21/2020  . Chronic neck and back pain 04/21/2020  . Psychophysiological insomnia 04/21/2020  . Opioid dependence on maintenance agonist therapy, no symptoms ([place]) 02/01/2019  . Loss of weight 01/22/2013  . Hypokalemia 01/22/2013  . Substance abuse ([place]) 01/22/2013  . Pancreatitis, chronic ([place]) 08/27/2012  . Abdominal pain, epigastric 08/27/2012  . Chest pain 01/26/2012  . SOB (shortness of breath) 01/26/2012     Past Medical History:  Diagnosis Date  . Blood transfusion without reported diagnosis   . Chest pain   . Chronic neck pain   . Chronic pain   . Chronic pancreatitis ([place])   . COPD (chronic obstructive pulmonary disease) ([place])   . Degenerative disk disease    C 5 &C 6  . Depression   . GI bleed   . Gout   . Migraine   . Migraine headache   . Pain management contract agreement   . Seizure disorder ([place])    x 7 years  . Seizures ([place])   . Substance abuse ([place])    Has been cleaned for 18 mos  . Zollinger-Ellison syndrome      Past Surgical History:  Procedure Laterality Date  . APPENDECTOMY     per CT 2012- s/p appendectomy  . CHOLECYSTECTOMY    . EYE SURGERY    .  HERNIA REPAIR    . PARTIAL GASTRECTOMY    . SMALL INTESTINE SURGERY       Family History  Problem Relation Age of Onset  . Angina Mother   . Nephrolithiasis Father   . Cancer Brother      Social History   Socioeconomic History  . Marital status: Married    Spouse name: Not on file  . Number of children: Not on file  . Years of education: Not on file  . Highest education level: Not on file  Occupational History  . Not on file  Tobacco Use  . Smoking status: Current Every Day Smoker    Packs/day: 2.00    Types: Cigarettes  . Smokeless tobacco: Never Used  . Tobacco  comment: about 2 packs a day  Substance and Sexual Activity  . Alcohol use: No  . Drug use: Yes    Types: Marijuana, Other-see comments    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  . Sexual activity: Not on file  Other Topics Concern  . Not on file  Social History Narrative  . Not on file   Social Determinants of Health   Financial Resource Strain:   . Difficulty of Paying Living Expenses:   Food Insecurity:   . Worried About Charity fundraiser in the Last Year:   . Arboriculturist in the Last Year:   Transportation Needs:   . Film/video editor (Medical):   [place] Kitchen Lack of Transportation (Non-Medical):   Physical Activity:   . Days of Exercise per Week:   . Minutes of Exercise per Session:   Stress:   . Feeling of Stress :   Social Connections:   . Frequency of Communication with Friends and Family:   . Frequency of Social Gatherings with Friends and Family:   . Attends Religious Services:   . Active Member of Clubs or Organizations:   . Attends Archivist Meetings:   [place] Kitchen Marital Status:   Intimate Partner Violence:   . Fear of Current or Ex-Partner:   . Emotionally Abused:   [place] Physically Abused:   . Sexually Abused:      Allergies  Allergen Reactions  . Ambien [Zolpidem Tartrate] Other (See Comments)    Black out  . Iohexol     Pt claims he has chest tightness and sob after iv contrast injections.  . Seroquel [Quetiapine Fumarate]     Blackout  . Sonata [Zaleplon]     "black out'  confused     Prior to Admission medications   Medication Sig Start Date End Date Taking? Authorizing Provider  amoxicillin-clavulanate (AUGMENTIN) 875-125 MG tablet Take 1 tablet by mouth 2 (two) times daily. 04/21/20  Yes Rutherford Guys, MD  atorvastatin (LIPITOR) 10 MG tablet Take 1 tablet (10 mg total) by mouth daily. 05/27/19  Yes Rutherford Guys, MD  cyclobenzaprine (FLEXERIL) 10 MG tablet Take 1 tablet (10 mg total) by mouth 3 (three) times daily. 04/21/20   Yes Rutherford Guys, MD  etanercept (ENBREL) 50 MG/ML injection Inject 50 mg into the skin once a week.   Yes [provider]  linaclotide Rolan Lipa) 145 MCG CAPS capsule Take 1 capsule (145 mcg total) by mouth daily before breakfast. 04/21/20  Yes Rutherford Guys, MD  mirtazapine (REMERON) 45 MG tablet Take 1 tablet (45 mg total) by mouth at bedtime. 04/21/20  Yes Rutherford Guys, MD  omeprazole (PRILOSEC) 20 MG capsule Take  1 capsule (20 mg total) by mouth daily. 04/19/20  Yes Rutherford Guys, MD  polyethylene glycol (MIRALAX) 17 g packet Take 17 g by mouth daily. 02/12/20  Yes Isla Pence, MD  traZODone (DESYREL) 50 MG tablet Take 3-4 tablets (150-200 mg total) by mouth at bedtime. 04/21/20  Yes Rutherford Guys, MD  Vitamin D, Ergocalciferol, (DRISDOL) 1.25 MG (50000 UT) CAPS capsule Take 1 capsule (50,000 Units total) by mouth every 7 (seven) days. 05/07/19  Yes Rutherford Guys, MD  buprenorphine-naloxone (SUBOXONE) 8-2 mg SUBL SL tablet Place 1 tablet under the tongue daily.    [provider]  colchicine 0.6 MG tablet Take 1 tablet (0.6 mg total) by mouth 2 (two) times daily as needed (at onset of gout flare up). Patient not taking: Reported on 05/03/2020 05/27/19   Rutherford Guys, MD     Depression screen [facility] 2/9 05/03/2020 09/06/2019 05/27/2019 03/31/2019 03/31/2019  Decreased Interest 0 0 0 0 0  Down, Depressed, Hopeless 0 0 0 0 0  PHQ - 2 Score 0 0 0 0 0     Fall Risk  05/03/2020 09/06/2019 05/27/2019 03/31/2019 03/31/2019  Falls in the past year? 0 0 0 0 0  Number falls in past yr: 0 0 0 0 -  Injury with Fall? 0 0 0 0 -  Risk for fall due to : - - - - -  Follow up Falls evaluation completed;Education provided - - - -      PHYSICAL EXAM: BP 129/87 Comment: taken from a previous visit  Ht 5\' 5"  (1.651 m)   Wt 112 lb (50.8 kg)   BMI 18.64 kg/m    Wt Readings from Last 3 Encounters:  05/03/20 112 lb (50.8 kg)  04/21/20 112 lb (50.8 kg)  02/12/20 102 lb (46.3  kg)       Education/Counseling provided regarding diet and exercise, prevention of chronic diseases, smoking/tobacco cessation, if applicable, and reviewed "Covered Medicare Preventive Services."

## 2020-05-03 NOTE — Patient Instructions (Addendum)
Thank you for taking time to come for your Medicare Wellness Visit. I appreciate your ongoing commitment to your health goals. Please review the following plan we discussed and let me know if I can assist you in the future.  Leroy Kennedy LPN  Preventive Care 89-58 Years Old, Male Preventive care refers to lifestyle choices and visits with your health care provider that can promote health and wellness. This includes:  A yearly physical exam. This is also called an annual well check.  Regular dental and eye exams.  Immunizations.  Screening for certain conditions.  Healthy lifestyle choices, such as eating a healthy diet, getting regular exercise, not using drugs or products that contain nicotine and tobacco, and limiting alcohol use. What can I expect for my preventive care visit? Physical exam Your health care provider will check:  Height and weight. These may be used to calculate body mass index (BMI), which is a measurement that tells if you are at a healthy weight.  Heart rate and blood pressure.  Your skin for abnormal spots. Counseling Your health care provider may ask you questions about:  Alcohol, tobacco, and drug use.  Emotional well-being.  Home and relationship well-being.  Sexual activity.  Eating habits.  Work and work Statistician. What immunizations do I need?  Influenza (flu) vaccine  This is recommended every year. Tetanus, diphtheria, and pertussis (Tdap) vaccine  You may need a Td booster every 10 years. Varicella (chickenpox) vaccine  You may need this vaccine if you have not already been vaccinated. Zoster (shingles) vaccine  You may need this after age 49. Measles, mumps, and rubella (MMR) vaccine  You may need at least one dose of MMR if you were born in 1957 or later. You may also need a second dose. Pneumococcal conjugate (PCV13) vaccine  You may need this if you have certain conditions and were not previously vaccinated. Pneumococcal  polysaccharide (PPSV23) vaccine  You may need one or two doses if you smoke cigarettes or if you have certain conditions. Meningococcal conjugate (MenACWY) vaccine  You may need this if you have certain conditions. Hepatitis A vaccine  You may need this if you have certain conditions or if you travel or work in places where you may be exposed to hepatitis A. Hepatitis B vaccine  You may need this if you have certain conditions or if you travel or work in places where you may be exposed to hepatitis B. Haemophilus influenzae type b (Hib) vaccine  You may need this if you have certain risk factors. Human papillomavirus (HPV) vaccine  If recommended by your health care provider, you may need three doses over 6 months. You may receive vaccines as individual doses or as more than one vaccine together in one shot (combination vaccines). Talk with your health care provider about the risks and benefits of combination vaccines. What tests do I need? Blood tests  Lipid and cholesterol levels. These may be checked every 5 years, or more frequently if you are over 51 years old.  Hepatitis C test.  Hepatitis B test. Screening  Lung cancer screening. You may have this screening every year starting at age 79 if you have a 30-pack-year history of smoking and currently smoke or have quit within the past 15 years.  Prostate cancer screening. Recommendations will vary depending on your family history and other risks.  Colorectal cancer screening. All adults should have this screening starting at age 37 and continuing until age 70. Your health care provider may  recommend screening at age 11 if you are at increased risk. You will have tests every 1-10 years, depending on your results and the type of screening test.  Diabetes screening. This is done by checking your blood sugar (glucose) after you have not eaten for a while (fasting). You may have this done every 1-3 years.  Sexually transmitted  disease (STD) testing. Follow these instructions at home: Eating and drinking  Eat a diet that includes fresh fruits and vegetables, whole grains, lean protein, and low-fat dairy products.  Take vitamin and mineral supplements as recommended by your health care provider.  Do not drink alcohol if your health care provider tells you not to drink.  If you drink alcohol: ? Limit how much you have to 0-2 drinks a day. ? Be aware of how much alcohol is in your drink. In the [place] one drink equals one 12 oz bottle of beer (355 mL), one 5 oz glass of wine (148 mL), or one 1 oz glass of hard liquor (44 mL). Lifestyle  Take daily care of your teeth and gums.  Stay active. Exercise for at least 30 minutes on 5 or more days each week.  Do not use any products that contain nicotine or tobacco, such as cigarettes, e-cigarettes, and chewing tobacco. If you need help quitting, ask your health care provider.  If you are sexually active, practice safe sex. Use a condom or other form of protection to prevent STIs (sexually transmitted infections).  Talk with your health care provider about taking a low-dose aspirin every day starting at age 80. What's next?  Go to your health care provider once a year for a well check visit.  Ask your health care provider how often you should have your eyes and teeth checked.  Stay up to date on all vaccines. This information is not intended to replace advice given to you by your health care provider. Make sure you discuss any questions you have with your health care provider. Document Revised: 11/12/2018 Document Reviewed: 11/12/2018 Elsevier Patient Education  2020 [employer].

## 2020-05-04 ENCOUNTER — Telehealth: Payer: Self-pay

## 2020-05-04 NOTE — Telephone Encounter (Signed)
Letter received from [facility] otolaryngology, not able to reach pt. Will mail Letter.

## 2020-05-19 ENCOUNTER — Other Ambulatory Visit: Payer: Self-pay | Admitting: Family Medicine

## 2020-05-25 DIAGNOSIS — J310 Chronic rhinitis: Secondary | ICD-10-CM | POA: Diagnosis not present

## 2020-05-25 DIAGNOSIS — J324 Chronic pansinusitis: Secondary | ICD-10-CM | POA: Diagnosis not present

## 2020-06-12 DIAGNOSIS — Z681 Body mass index (BMI) 19 or less, adult: Secondary | ICD-10-CM | POA: Diagnosis not present

## 2020-06-12 DIAGNOSIS — R768 Other specified abnormal immunological findings in serum: Secondary | ICD-10-CM | POA: Diagnosis not present

## 2020-06-12 DIAGNOSIS — M2550 Pain in unspecified joint: Secondary | ICD-10-CM | POA: Diagnosis not present

## 2020-06-12 DIAGNOSIS — M0579 Rheumatoid arthritis with rheumatoid factor of multiple sites without organ or systems involvement: Secondary | ICD-10-CM | POA: Diagnosis not present

## 2020-07-07 ENCOUNTER — Telehealth: Payer: Self-pay | Admitting: Family Medicine

## 2020-07-07 NOTE — Telephone Encounter (Signed)
LM that pt is unable to receive refill of Amox without a new evaluation

## 2020-07-07 NOTE — Telephone Encounter (Signed)
Pt is wanting another round of  What is the name of the medication? amoxicillin-clavulanate (AUGMENTIN) 875-125 MG tablet [[id number]]  linaclotide (LINZESS) 145 MCG CAPS capsule     Have you contacted your pharmacy to request a refill? y  Which pharmacy would you like this sent to [facility] [place], [place] [address]  [place], [place] [address]  Phone:  [phone] Fax:  [phone]    Patient notified that their request is being sent to the clinical staff for review and that they should receive a call once it is complete. If they do not receive a call within 72 hours they can check with their pharmacy or our office.

## 2020-09-21 DIAGNOSIS — M25521 Pain in right elbow: Secondary | ICD-10-CM | POA: Diagnosis not present

## 2020-09-21 DIAGNOSIS — Z681 Body mass index (BMI) 19 or less, adult: Secondary | ICD-10-CM | POA: Diagnosis not present

## 2020-09-21 DIAGNOSIS — R768 Other specified abnormal immunological findings in serum: Secondary | ICD-10-CM | POA: Diagnosis not present

## 2020-09-21 DIAGNOSIS — M0579 Rheumatoid arthritis with rheumatoid factor of multiple sites without organ or systems involvement: Secondary | ICD-10-CM | POA: Diagnosis not present

## 2020-10-09 ENCOUNTER — Telehealth: Payer: Self-pay | Admitting: Family Medicine

## 2020-10-09 ENCOUNTER — Other Ambulatory Visit: Payer: Self-pay | Admitting: Family Medicine

## 2020-10-09 DIAGNOSIS — F5104 Psychophysiologic insomnia: Secondary | ICD-10-CM

## 2020-10-09 MED ORDER — MIRTAZAPINE 45 MG PO TABS: 45.0000 mg | ORAL_TABLET | Freq: Every day | ORAL | 1 refills | Status: DC

## 2020-10-09 NOTE — Telephone Encounter (Signed)
See below

## 2020-10-09 NOTE — Telephone Encounter (Signed)
Refill sent. Thanks

## 2020-10-09 NOTE — Telephone Encounter (Signed)
Pt is looking for courtest=y refill on  Med below has TOC for 11?11?2021 What is the name of the medication? mirtazapine (REMERON) 45 MG tablet [[id number]  Have you contacted your pharmacy to request a refill? Y  Which pharmacy would you like this sent to? [facility] [place], [place] - [address]  [place], [place] [address]  Phone:  [phone] Fax:  [phone]  DEA #:  [id number]     Patient notified that their request is being sent to the clinical staff for review and that they should receive a call once it is complete. If they do not receive a call within 72 hours they can check with their pharmacy or our office.

## 2020-10-09 NOTE — Telephone Encounter (Signed)
Patient is requesting a refill of the following medications: Requested Prescriptions    No prescriptions requested or ordered in this encounter  Remron 45 mg   Date of patient request: 10/09/20 Last office visit: 01/17/20 Date of last refill: 04/21/20 Last refill amount: 90 tab

## 2020-10-12 ENCOUNTER — Ambulatory Visit (INDEPENDENT_AMBULATORY_CARE_PROVIDER_SITE_OTHER): Payer: Medicare HMO | Admitting: Family Medicine

## 2020-10-12 ENCOUNTER — Encounter: Payer: Self-pay | Admitting: Family Medicine

## 2020-10-12 ENCOUNTER — Other Ambulatory Visit: Payer: Self-pay

## 2020-10-12 VITALS — BP 124/76 | HR 87 | Temp 98.3°F | Resp 15 | Ht 65.0 in | Wt 116.2 lb

## 2020-10-12 DIAGNOSIS — E559 Vitamin D deficiency, unspecified: Secondary | ICD-10-CM | POA: Diagnosis not present

## 2020-10-12 DIAGNOSIS — Z903 Acquired absence of stomach [part of]: Secondary | ICD-10-CM | POA: Diagnosis not present

## 2020-10-12 DIAGNOSIS — E7800 Pure hypercholesterolemia, unspecified: Secondary | ICD-10-CM | POA: Diagnosis not present

## 2020-10-12 DIAGNOSIS — Z1329 Encounter for screening for other suspected endocrine disorder: Secondary | ICD-10-CM | POA: Diagnosis not present

## 2020-10-12 DIAGNOSIS — E876 Hypokalemia: Secondary | ICD-10-CM

## 2020-10-12 DIAGNOSIS — Z131 Encounter for screening for diabetes mellitus: Secondary | ICD-10-CM | POA: Diagnosis not present

## 2020-10-12 DIAGNOSIS — Z23 Encounter for immunization: Secondary | ICD-10-CM | POA: Diagnosis not present

## 2020-10-12 MED ORDER — LINACLOTIDE 145 MCG PO CAPS: 145.0000 ug | ORAL_CAPSULE | Freq: Every day | ORAL | 5 refills | Status: DC

## 2020-10-12 NOTE — Patient Instructions (Addendum)
Health Maintenance, Male Adopting a healthy lifestyle and getting preventive care are important in promoting health and wellness. Ask your health care provider about:  The right schedule for you to have regular tests and exams.  Things you can do on your own to prevent diseases and keep yourself healthy. What should I know about diet, weight, and exercise? Eat a healthy diet   Eat a diet that includes plenty of vegetables, fruits, low-fat dairy products, and lean protein.  Do not eat a lot of foods that are high in solid fats, added sugars, or sodium. Maintain a healthy weight Body mass index (BMI) is a measurement that can be used to identify possible weight problems. It estimates body fat based on height and weight. Your health care provider can help determine your BMI and help you achieve or maintain a healthy weight. Get regular exercise Get regular exercise. This is one of the most important things you can do for your health. Most adults should:  Exercise for at least 150 minutes each week. The exercise should increase your heart rate and make you sweat (moderate-intensity exercise).  Do strengthening exercises at least twice a week. This is in addition to the moderate-intensity exercise.  Spend less time sitting. Even light physical activity can be beneficial. Watch cholesterol and blood lipids Have your blood tested for lipids and cholesterol at 58 years of age, then have this test every 5 years. You may need to have your cholesterol levels checked more often if:  Your lipid or cholesterol levels are high.  You are older than 58 years of age.  You are at high risk for heart disease. What should I know about cancer screening? Many types of cancers can be detected early and may often be prevented. Depending on your health history and family history, you may need to have cancer screening at various ages. This may include screening for:  Colorectal cancer.  Prostate  cancer.  Skin cancer.  Lung cancer. What should I know about heart disease, diabetes, and high blood pressure? Blood pressure and heart disease  High blood pressure causes heart disease and increases the risk of stroke. This is more likely to develop in people who have high blood pressure readings, are of African descent, or are overweight.  Talk with your health care provider about your target blood pressure readings.  Have your blood pressure checked: ? Every 3-5 years if you are 82-41 years of age. ? Every year if you are 62 years old or older.  If you are between the ages of 69 and 62 and are a current or former smoker, ask your health care provider if you should have a one-time screening for abdominal aortic aneurysm (AAA). Diabetes Have regular diabetes screenings. This checks your fasting blood sugar level. Have the screening done:  Once every three years after age 80 if you are at a normal weight and have a low risk for diabetes.  More often and at a younger age if you are overweight or have a high risk for diabetes. What should I know about preventing infection? Hepatitis B If you have a higher risk for hepatitis B, you should be screened for this virus. Talk with your health care provider to find out if you are at risk for hepatitis B infection. Hepatitis C Blood testing is recommended for:  Everyone born from [birth year] through 1964.  Anyone with known risk factors for hepatitis C. Sexually transmitted infections (STIs)  You should be screened  each year for STIs, including gonorrhea and chlamydia, if: ? You are sexually active and are younger than 58 years of age. ? You are older than 58 years of age and your health care provider tells you that you are at risk for this type of infection. ? Your sexual activity has changed since you were last screened, and you are at increased risk for chlamydia or gonorrhea. Ask your health care provider if you are at risk.  Ask your  health care provider about whether you are at high risk for HIV. Your health care provider may recommend a prescription medicine to help prevent HIV infection. If you choose to take medicine to prevent HIV, you should first get tested for HIV. You should then be tested every 3 months for as long as you are taking the medicine. Follow these instructions at home: Lifestyle  Do not use any products that contain nicotine or tobacco, such as cigarettes, e-cigarettes, and chewing tobacco. If you need help quitting, ask your health care provider.  Do not use street drugs.  Do not share needles.  Ask your health care provider for help if you need support or information about quitting drugs. Alcohol use  Do not drink alcohol if your health care provider tells you not to drink.  If you drink alcohol: ? Limit how much you have to 0-2 drinks a day. ? Be aware of how much alcohol is in your drink. In the [place] one drink equals one 12 oz bottle of beer (355 mL), one 5 oz glass of wine (148 mL), or one 1 oz glass of hard liquor (44 mL). General instructions  Schedule regular health, dental, and eye exams.  Stay current with your vaccines.  Tell your health care provider if: ? You often feel depressed. ? You have ever been abused or do not feel safe at home. Summary  Adopting a healthy lifestyle and getting preventive care are important in promoting health and wellness.  Follow your health care provider's instructions about healthy diet, exercising, and getting tested or screened for diseases.  Follow your health care provider's instructions on monitoring your cholesterol and blood pressure. This information is not intended to replace advice given to you by your health care provider. Make sure you discuss any questions you have with your health care provider. Document Revised: 11/11/2018 Document Reviewed: 11/11/2018 Elsevier Patient Education  [employer].   If you have lab work done  today you will be contacted with your lab results within the next 2 weeks.  If you have not heard from [place] then please contact us. The fastest way to get your results is to register for My Chart.   IF you received an x-ray today, you will receive an invoice from [facility]. Please contact [facility] at [phone] with questions or concerns regarding your invoice.   IF you received labwork today, you will receive an invoice from [facility]. Please contact [facility] at [phone] with questions or concerns regarding your invoice.   Our billing staff will not be able to assist you with questions regarding bills from these companies.  You will be contacted with the lab results as soon as they are available. The fastest way to get your results is to activate your My Chart account. Instructions are located on the last page of this paperwork. If you have not heard from [place] regarding the results in 2 weeks, please contact this office.

## 2020-10-12 NOTE — Progress Notes (Signed)
11/11/20213:16 PM  Jerry Knight January 28, 1961, 59 y.o., male [id number]  Chief Complaint  Patient presents with  . Establish Care    pt here to establish care from Dr Pamella Pert    HPI:   Patient is a 59 y.o. male with past medical history significant for RA who presents today for transfer of care  Retired [employer] with wife Long term opiate use Rehab 2 years Suboxone every 3-5 days Helps with pain management  Abdominal pain Rheumatology follows Enbrel for shoulder and elbow Keeps steroids for flare ups of pain  Seasonal sinus issues: will see ENT 12/08/19 Audiology to get new hearing aids Hearing problems since in the [place], goes to [place] Would like dental implants from [place] Discussed immunizations Tetanus: today Flu: 1 month ago Covid: done Last screening for diabetes: 02/12/2020 Last lipid screening: 09/06/2019 Colon cancer: Last 07/13/2019  Has issues with stomach adhesions Regularly follows up with GI to prevent blockages  Frequently in [place] to help mom with health problems Still smokes  Right arm joint pain, has noticed right knee pain starting  Hard of hearing, is working on getting new hearing aids    Depression screen [facility] 2/9 10/12/2020 05/03/2020 09/06/2019  Decreased Interest 0 0 0  Down, Depressed, Hopeless 0 0 0  PHQ - 2 Score 0 0 0    Fall Risk  10/12/2020 05/03/2020 09/06/2019 05/27/2019 03/31/2019  Falls in the past year? 0 0 0 0 0  Number falls in past yr: 0 0 0 0 0  Injury with Fall? 0 0 0 0 0  Risk for fall due to : No Fall Risks - - - -  Follow up Falls evaluation completed Falls evaluation completed;Education provided - - -     Allergies  Allergen Reactions  . Ambien [Zolpidem Tartrate] Other (See Comments)    Black out  . Iohexol     Pt claims he has chest tightness and sob after iv contrast injections.  . Seroquel [Quetiapine Fumarate]     Blackout  .  Sonata [Zaleplon]     "black out'  confused    Prior to Admission medications   Medication Sig Start Date End Date Taking? Authorizing Provider  amoxicillin-clavulanate (AUGMENTIN) 875-125 MG tablet Take 1 tablet by mouth 2 (two) times daily. 04/21/20  Yes Rutherford Guys, MD  atorvastatin (LIPITOR) 10 MG tablet Take 1 tablet (10 mg total) by mouth daily. 05/27/19  Yes Rutherford Guys, MD  buprenorphine-naloxone (SUBOXONE) 8-2 mg SUBL SL tablet Place 1 tablet under the tongue daily.   Yes [provider]  cyclobenzaprine (FLEXERIL) 10 MG tablet Take 1 tablet (10 mg total) by mouth 3 (three) times daily. 04/21/20  Yes Rutherford Guys, MD  etanercept (ENBREL) 50 MG/ML injection Inject 50 mg into the skin once a week.   Yes [provider]  linaclotide Rolan Lipa) 145 MCG CAPS capsule Take 1 capsule (145 mcg total) by mouth daily before breakfast. 04/21/20  Yes Rutherford Guys, MD  mirtazapine (REMERON) 45 MG tablet Take 1 tablet (45 mg total) by mouth at bedtime. 10/09/20  Yes Saud Bail, Laurita Quint, FNP  omeprazole (PRILOSEC) 20 MG capsule TAKE 1 CAPSULE(20 MG) BY MOUTH DAILY 05/19/20  Yes Rutherford Guys, MD  traZODone (DESYREL) 50 MG tablet Take 3-4 tablets (150-200 mg total) by mouth at bedtime. 04/21/20  Yes Rutherford Guys, MD  Vitamin D, Ergocalciferol, (DRISDOL) 1.25 MG (50000 UT)  CAPS capsule Take 1 capsule (50,000 Units total) by mouth every 7 (seven) days. 05/07/19  Yes Rutherford Guys, MD    Past Medical History:  Diagnosis Date  . Blood transfusion without reported diagnosis   . Chest pain   . Chronic neck pain   . Chronic pain   . Chronic pancreatitis ([place])   . COPD (chronic obstructive pulmonary disease) ([place])   . Degenerative disk disease    C 5 &C 6  . Depression   . GI bleed   . Gout   . Migraine   . Migraine headache   . Pain management contract agreement   . Seizure disorder ([place])    x 7 years  . Seizures ([place])   . Substance abuse ([place])    Has been  cleaned for 18 mos  . Zollinger-Ellison syndrome     Past Surgical History:  Procedure Laterality Date  . APPENDECTOMY     per CT 2012- s/p appendectomy  . CHOLECYSTECTOMY    . EYE SURGERY    . HERNIA REPAIR    . PARTIAL GASTRECTOMY    . SMALL INTESTINE SURGERY      Social History   Tobacco Use  . Smoking status: Current Every Day Smoker    Packs/day: 2.00    Types: Cigarettes  . Smokeless tobacco: Never Used  . Tobacco comment: about 2 packs a day  Substance Use Topics  . Alcohol use: No    Family History  Problem Relation Age of Onset  . Angina Mother   . Nephrolithiasis Father   . Cancer Brother     Review of Systems  Constitutional: Negative for chills, fever and malaise/fatigue.  HENT: Positive for hearing loss. Negative for ear discharge, ear pain and tinnitus.   Eyes: Negative for blurred vision and double vision.  Respiratory: Negative for cough, shortness of breath and wheezing.   Cardiovascular: Negative for chest pain, palpitations and leg swelling.  Gastrointestinal: Positive for abdominal pain and diarrhea. Negative for blood in stool, constipation, heartburn, nausea and vomiting.  Genitourinary: Negative for dysuria, frequency and hematuria.  Musculoskeletal: Positive for joint pain. Negative for back pain.  Skin: Negative for rash.  Neurological: Negative for dizziness, tingling, weakness and headaches.     OBJECTIVE:  Today's Vitals   10/12/20 1421  BP: 124/76  Pulse: 87  Resp: 15  Temp: 98.3 F (36.8 C)  TempSrc: Temporal  SpO2: 97%  Weight: 116 lb 3.2 oz (52.7 kg)  Height: _0  (1.651 m)   Body mass index is 19.34 kg/m.   Physical Exam Vitals reviewed.  Constitutional:      Appearance: Normal appearance.  HENT:     Head: Normocephalic and atraumatic.     Right Ear: Tympanic membrane, ear canal and external ear normal.     Left Ear: Tympanic membrane, ear canal and external ear normal.  Eyes:     Conjunctiva/sclera:  Conjunctivae normal.     Pupils: Pupils are equal, round, and reactive to light.  Cardiovascular:     Rate and Rhythm: Normal rate and regular rhythm.     Pulses: Normal pulses.     Heart sounds: Normal heart sounds. No murmur heard.  No friction rub. No gallop.   Pulmonary:     Effort: Pulmonary effort is normal. No respiratory distress.     Breath sounds: Normal breath sounds. No stridor. No wheezing or rales.  Abdominal:     General: Bowel sounds are normal.  Palpations: Abdomen is soft.     Tenderness: There is no abdominal tenderness.  Musculoskeletal:        General: No swelling. Normal range of motion.     Right lower leg: No edema.     Left lower leg: No edema.  Skin:    General: Skin is warm and dry.  Neurological:     General: No focal deficit present.     Mental Status: He is alert and oriented to person, place, and time.  Psychiatric:        Mood and Affect: Mood normal.        Behavior: Behavior normal.     No results found for this or any previous visit (from the past 24 hour(s)).  No results found.   ASSESSMENT and PLAN  Problem List Items Addressed This Visit      Other   Hypokalemia - Primary   Relevant Orders   CMP14+EGFR    Other Visit Diagnoses    Pure hypercholesterolemia       Relevant Orders   Lipid Panel On atorvastatin 38m daily   Screening for thyroid disorder       Vitamin D deficiency       Relevant Orders   Vitamin D, 25-hydroxy Takes weekly vitamin D   Screening for diabetes mellitus       Relevant Orders   Hemoglobin A1c   Encounter for vaccination       Relevant Orders   Tdap vaccine greater than or equal to 7yo IM   H/O laparoscopic partial gastrectomy       Relevant Medications   linaclotide (LINZESS) 145 MCG CAPS capsule     Will follow up with lab results  Return in about 4 months (around 02/09/2021).    KHuston FoleyJust, FNP-BC [facility] [place] [id number].   [phone] [phone]

## 2020-10-13 ENCOUNTER — Other Ambulatory Visit: Payer: Self-pay | Admitting: Family Medicine

## 2020-10-13 DIAGNOSIS — E7800 Pure hypercholesterolemia, unspecified: Secondary | ICD-10-CM

## 2020-10-13 LAB — LIPID PANEL
Chol/HDL Ratio: 4.7 ratio (ref 0.0–5.0)
Cholesterol, Total: 193 mg/dL (ref 100–199)
HDL: 41 mg/dL (ref >39)
LDL Chol Calc (NIH): 141 mg/dL — ABNORMAL HIGH (ref 0–99)
Triglycerides: 61 mg/dL (ref 0–149)
VLDL Cholesterol Cal: 11 mg/dL (ref 5–40)

## 2020-10-13 LAB — CMP14+EGFR
ALT: 13 IU/L (ref 0–44)
AST: 18 IU/L (ref 0–40)
Albumin/Globulin Ratio: 1.5 (ref 1.2–2.2)
Albumin: 4.5 g/dL (ref 3.8–4.9)
Alkaline Phosphatase: 134 IU/L — ABNORMAL HIGH (ref 44–121)
BUN/Creatinine Ratio: 15 (ref 9–20)
BUN: 10 mg/dL (ref 6–24)
Bilirubin Total: 0.3 mg/dL (ref 0.0–1.2)
CO2: 26 mmol/L (ref 20–29)
Calcium: 9.5 mg/dL (ref 8.7–10.2)
Chloride: 102 mmol/L (ref 96–106)
Creatinine, Ser: 0.68 mg/dL — ABNORMAL LOW (ref 0.76–1.27)
GFR calc Af Amer: 123 mL/min/{1.73_m2} (ref >59)
GFR calc non Af Amer: 106 mL/min/{1.73_m2} (ref >59)
Globulin, Total: 3 g/dL (ref 1.5–4.5)
Glucose: 86 mg/dL (ref 65–99)
Potassium: 4.6 mmol/L (ref 3.5–5.2)
Sodium: 140 mmol/L (ref 134–144)
Total Protein: 7.5 g/dL (ref 6.0–8.5)

## 2020-10-13 LAB — HEMOGLOBIN A1C
Est. average glucose Bld gHb Est-mCnc: 120 mg/dL
Hgb A1c MFr Bld: 5.8 % — ABNORMAL HIGH (ref 4.8–5.6)

## 2020-10-13 LAB — VITAMIN D 25 HYDROXY (VIT D DEFICIENCY, FRACTURES): Vit D, 25-Hydroxy: 27.1 ng/mL — ABNORMAL LOW (ref 30.0–100.0)

## 2020-10-13 MED ORDER — ATORVASTATIN CALCIUM 20 MG PO TABS: 20.0000 mg | ORAL_TABLET | Freq: Every day | ORAL | 3 refills | Status: DC

## 2020-10-13 NOTE — Progress Notes (Signed)
If you could let Jerry Knight know his labs overall look good. His vitamin D remains low, so continue to stay on the weekly supplement. His cholesterol remains elevated I am going to have him start taking 2 tabs of his atorvastatin daily for a total of 20mg  and will send 20mg  tabs to his pharmacy. His A1C places him in the prediabetes range. No medications needed at this time, but continue to exercise as able and improve diet by cutting out excess sugars. Thanks!

## 2020-10-24 ENCOUNTER — Other Ambulatory Visit: Payer: Self-pay | Admitting: Family Medicine

## 2020-10-24 ENCOUNTER — Other Ambulatory Visit: Payer: Self-pay

## 2020-10-24 DIAGNOSIS — F5104 Psychophysiologic insomnia: Secondary | ICD-10-CM

## 2020-10-24 NOTE — Telephone Encounter (Signed)
What is the name of the medication? cyclobenzaprine (FLEXERIL) 10 MG tablet [[id number]]   Have you contacted your pharmacy to request a refill? Yes, it's not there. He had a TOC with [facility] on 10/12/20. He just needs this script filled, he is leaving to go out of town on 10/25/20.   Which pharmacy would you like this sent to? Pharmacy  [facility] [phone], [place] - [place]  [place], [place] [address]  Phone:  [phone] Fax:  [phone]  DEA #:  [id number]      Patient notified that their request is being sent to the clinical staff for review and that they should receive a call once it is complete. If they do not receive a call within 72 hours they can check with their pharmacy or our office.

## 2020-10-25 MED ORDER — CYCLOBENZAPRINE HCL 10 MG PO TABS: 10.0000 mg | ORAL_TABLET | Freq: Three times a day (TID) | ORAL | 5 refills | Status: DC

## 2020-10-31 MED ORDER — MIRTAZAPINE 45 MG PO TABS: 45.0000 mg | ORAL_TABLET | Freq: Every day | ORAL | 1 refills | Status: DC

## 2020-10-31 NOTE — Telephone Encounter (Signed)
Pt came in and stated he is needing these medications refilled. Pt had a TOC with FNP [facility] on 10/12/20. Pt would like a call when these are sent in  mirtazapine (REMERON) 45 MG tablet [[id number]]  traZODone (DESYREL) 50 MG tablet [[id number]]   [id number] - [place], [place]

## 2020-10-31 NOTE — Telephone Encounter (Signed)
Patient is requesting a refill of the following medications: Requested Prescriptions   Pending Prescriptions Disp Refills   mirtazapine (REMERON) 45 MG tablet 90 tablet 1    Sig: Take 1 tablet (45 mg total) by mouth at bedtime.    Date of patient request: 10/31/2020 Last office visit: 10/12/2020 Date of last refill: 10/09/2020 Last refill amount: 90

## 2020-10-31 NOTE — Telephone Encounter (Signed)
Patient is requesting a refill of the following medications: Requested Prescriptions    No prescriptions requested or ordered in this encounter    Date of patient request: 10/31/2020 Last office visit: 10/12/2020 Date of last refill: 10/09/2020 Last refill amount: 90

## 2020-10-31 NOTE — Telephone Encounter (Signed)
Pt requesting refills but the trazodone has refills still available (I called and verified with the pharmacy)

## 2020-10-31 NOTE — Telephone Encounter (Signed)
Ok. Let me know if you need me to do anything. Thanks!

## 2020-11-27 ENCOUNTER — Other Ambulatory Visit: Payer: Self-pay | Admitting: Family Medicine

## 2020-11-27 ENCOUNTER — Telehealth: Payer: Self-pay | Admitting: Family Medicine

## 2020-11-27 DIAGNOSIS — G8929 Other chronic pain: Secondary | ICD-10-CM

## 2020-11-27 MED ORDER — CYCLOBENZAPRINE HCL 10 MG PO TABS: 10.0000 mg | ORAL_TABLET | Freq: Three times a day (TID) | ORAL | 5 refills | Status: DC

## 2020-11-27 MED ORDER — TRAZODONE HCL 50 MG PO TABS: 150.0000 mg | ORAL_TABLET | Freq: Every day | ORAL | 5 refills | Status: DC

## 2020-11-27 NOTE — Telephone Encounter (Signed)
Patient is requesting a refill of the following medications: Requested Prescriptions    No prescriptions requested or ordered in this encounter  Trazodone 50 mg   Date of patient request: 11/27/2020 Last office visit: 10/12/2020 Date of last refill: 5/51/2021 Last refill amount: 120 tablets x 5 refills

## 2020-11-27 NOTE — Telephone Encounter (Signed)
Pt is needed refills on   cyclobenzaprine (FLEXERIL) 10 MG tablet [[id number]]   traZODone (DESYREL) 50 MG tablet [[id number]]  [id number] - [place], [place] - [address] AT [facility] [address]  Please advise.

## 2020-12-07 DIAGNOSIS — J324 Chronic pansinusitis: Secondary | ICD-10-CM | POA: Diagnosis not present

## 2020-12-07 DIAGNOSIS — J310 Chronic rhinitis: Secondary | ICD-10-CM | POA: Diagnosis not present

## 2020-12-22 ENCOUNTER — Other Ambulatory Visit: Payer: Self-pay | Admitting: Family Medicine

## 2020-12-22 DIAGNOSIS — Z903 Acquired absence of stomach [part of]: Secondary | ICD-10-CM

## 2020-12-22 DIAGNOSIS — M549 Dorsalgia, unspecified: Secondary | ICD-10-CM

## 2020-12-22 DIAGNOSIS — F5104 Psychophysiologic insomnia: Secondary | ICD-10-CM

## 2020-12-22 NOTE — Telephone Encounter (Signed)
Pt came in and needs refills on  cyclobenzaprine (FLEXERIL) 10 MG tablet [[id number]] He will be out this Wenesday/ only recived a 10 day supply   Needs it right away    Will also need refills on the following befroe his follow up aapt 02/19/2021    mirtazapine (REMERON) 45 MG tablet [[id number] traZODone (DESYREL) 50 MG tablet [[id number]]  linaclotide (LINZESS) 145 MCG CAPS capsule [[id number] And omeprazole (PRILOSEC) 20 MG capsule [[id number]    All sent to  [id number] [place], [place] - [place] AT [place]  [address] [place], [place] [address]  Phone:  [phone] Fax:  [phone]  DEA #:  [id number]

## 2020-12-24 MED ORDER — MIRTAZAPINE 45 MG PO TABS: 45.0000 mg | ORAL_TABLET | Freq: Every day | ORAL | 1 refills | Status: DC

## 2020-12-24 MED ORDER — LINACLOTIDE 145 MCG PO CAPS: 145.0000 ug | ORAL_CAPSULE | Freq: Every day | ORAL | 5 refills | Status: DC

## 2020-12-24 MED ORDER — OMEPRAZOLE 20 MG PO CPDR: DELAYED_RELEASE_CAPSULE | ORAL | 3 refills | Status: DC

## 2020-12-24 MED ORDER — CYCLOBENZAPRINE HCL 10 MG PO TABS: 10.0000 mg | ORAL_TABLET | Freq: Three times a day (TID) | ORAL | 5 refills | Status: DC

## 2020-12-24 MED ORDER — TRAZODONE HCL 50 MG PO TABS: 150.0000 mg | ORAL_TABLET | Freq: Every day | ORAL | 5 refills | Status: DC

## 2021-01-04 DIAGNOSIS — F1120 Opioid dependence, uncomplicated: Secondary | ICD-10-CM | POA: Diagnosis not present

## 2021-01-05 DIAGNOSIS — F1120 Opioid dependence, uncomplicated: Secondary | ICD-10-CM | POA: Diagnosis not present

## 2021-01-05 DIAGNOSIS — G4700 Insomnia, unspecified: Secondary | ICD-10-CM | POA: Diagnosis not present

## 2021-01-24 DIAGNOSIS — Z79899 Other long term (current) drug therapy: Secondary | ICD-10-CM | POA: Diagnosis not present

## 2021-01-24 DIAGNOSIS — M0579 Rheumatoid arthritis with rheumatoid factor of multiple sites without organ or systems involvement: Secondary | ICD-10-CM | POA: Diagnosis not present

## 2021-01-24 DIAGNOSIS — M2550 Pain in unspecified joint: Secondary | ICD-10-CM | POA: Diagnosis not present

## 2021-01-24 DIAGNOSIS — M25521 Pain in right elbow: Secondary | ICD-10-CM | POA: Diagnosis not present

## 2021-01-24 DIAGNOSIS — M25561 Pain in right knee: Secondary | ICD-10-CM | POA: Diagnosis not present

## 2021-01-24 DIAGNOSIS — M25562 Pain in left knee: Secondary | ICD-10-CM | POA: Diagnosis not present

## 2021-01-24 DIAGNOSIS — Z681 Body mass index (BMI) 19 or less, adult: Secondary | ICD-10-CM | POA: Diagnosis not present

## 2021-01-25 DIAGNOSIS — G4700 Insomnia, unspecified: Secondary | ICD-10-CM | POA: Diagnosis not present

## 2021-01-25 DIAGNOSIS — F1120 Opioid dependence, uncomplicated: Secondary | ICD-10-CM | POA: Diagnosis not present

## 2021-02-01 DIAGNOSIS — F1320 Sedative, hypnotic or anxiolytic dependence, uncomplicated: Secondary | ICD-10-CM | POA: Diagnosis not present

## 2021-02-01 DIAGNOSIS — F1220 Cannabis dependence, uncomplicated: Secondary | ICD-10-CM | POA: Diagnosis not present

## 2021-02-01 DIAGNOSIS — F1021 Alcohol dependence, in remission: Secondary | ICD-10-CM | POA: Diagnosis not present

## 2021-02-01 DIAGNOSIS — F1120 Opioid dependence, uncomplicated: Secondary | ICD-10-CM | POA: Diagnosis not present

## 2021-02-08 DIAGNOSIS — F1120 Opioid dependence, uncomplicated: Secondary | ICD-10-CM | POA: Diagnosis not present

## 2021-02-15 DIAGNOSIS — F1120 Opioid dependence, uncomplicated: Secondary | ICD-10-CM | POA: Diagnosis not present

## 2021-02-15 DIAGNOSIS — F1320 Sedative, hypnotic or anxiolytic dependence, uncomplicated: Secondary | ICD-10-CM | POA: Diagnosis not present

## 2021-02-15 DIAGNOSIS — F1021 Alcohol dependence, in remission: Secondary | ICD-10-CM | POA: Diagnosis not present

## 2021-02-15 DIAGNOSIS — F1220 Cannabis dependence, uncomplicated: Secondary | ICD-10-CM | POA: Diagnosis not present

## 2021-02-16 DIAGNOSIS — F1120 Opioid dependence, uncomplicated: Secondary | ICD-10-CM | POA: Diagnosis not present

## 2021-02-19 ENCOUNTER — Ambulatory Visit (INDEPENDENT_AMBULATORY_CARE_PROVIDER_SITE_OTHER): Payer: Medicare HMO | Admitting: Family Medicine

## 2021-02-19 ENCOUNTER — Other Ambulatory Visit: Payer: Self-pay

## 2021-02-19 ENCOUNTER — Encounter: Payer: Self-pay | Admitting: Family Medicine

## 2021-02-19 VITALS — BP 101/73 | HR 102 | Temp 98.3°F | Resp 15 | Ht 65.0 in | Wt 115.2 lb

## 2021-02-19 DIAGNOSIS — E7800 Pure hypercholesterolemia, unspecified: Secondary | ICD-10-CM | POA: Diagnosis not present

## 2021-02-19 DIAGNOSIS — K219 Gastro-esophageal reflux disease without esophagitis: Secondary | ICD-10-CM

## 2021-02-19 DIAGNOSIS — G8929 Other chronic pain: Secondary | ICD-10-CM | POA: Diagnosis not present

## 2021-02-19 DIAGNOSIS — M542 Cervicalgia: Secondary | ICD-10-CM | POA: Diagnosis not present

## 2021-02-19 DIAGNOSIS — Z903 Acquired absence of stomach [part of]: Secondary | ICD-10-CM | POA: Diagnosis not present

## 2021-02-19 DIAGNOSIS — F5104 Psychophysiologic insomnia: Secondary | ICD-10-CM | POA: Diagnosis not present

## 2021-02-19 DIAGNOSIS — M549 Dorsalgia, unspecified: Secondary | ICD-10-CM

## 2021-02-19 MED ORDER — MIRTAZAPINE 45 MG PO TABS: 45.0000 mg | ORAL_TABLET | Freq: Every day | ORAL | 1 refills | Status: DC

## 2021-02-19 MED ORDER — OMEPRAZOLE 20 MG PO CPDR: DELAYED_RELEASE_CAPSULE | ORAL | 3 refills | Status: AC

## 2021-02-19 MED ORDER — ATORVASTATIN CALCIUM 20 MG PO TABS: 20.0000 mg | ORAL_TABLET | Freq: Every day | ORAL | 3 refills | Status: AC

## 2021-02-19 MED ORDER — CYCLOBENZAPRINE HCL 10 MG PO TABS: 10.0000 mg | ORAL_TABLET | Freq: Three times a day (TID) | ORAL | 5 refills | Status: AC

## 2021-02-19 MED ORDER — LINACLOTIDE 145 MCG PO CAPS: 145.0000 ug | ORAL_CAPSULE | Freq: Every day | ORAL | 5 refills | Status: DC

## 2021-02-19 MED ORDER — TRAZODONE HCL 50 MG PO TABS: 150.0000 mg | ORAL_TABLET | Freq: Every day | ORAL | 5 refills | Status: DC

## 2021-02-19 NOTE — Patient Instructions (Addendum)
Health Maintenance, Male Adopting a healthy lifestyle and getting preventive care are important in promoting health and wellness. Ask your health care provider about:  The right schedule for you to have regular tests and exams.  Things you can do on your own to prevent diseases and keep yourself healthy. What should I know about diet, weight, and exercise? Eat a healthy diet  Eat a diet that includes plenty of vegetables, fruits, low-fat dairy products, and lean protein.  Do not eat a lot of foods that are high in solid fats, added sugars, or sodium.   Maintain a healthy weight Body mass index (BMI) is a measurement that can be used to identify possible weight problems. It estimates body fat based on height and weight. Your health care provider can help determine your BMI and help you achieve or maintain a healthy weight. Get regular exercise Get regular exercise. This is one of the most important things you can do for your health. Most adults should:  Exercise for at least 150 minutes each week. The exercise should increase your heart rate and make you sweat (moderate-intensity exercise).  Do strengthening exercises at least twice a week. This is in addition to the moderate-intensity exercise.  Spend less time sitting. Even light physical activity can be beneficial. Watch cholesterol and blood lipids Have your blood tested for lipids and cholesterol at 59 years of age, then have this test every 5 years. You may need to have your cholesterol levels checked more often if:  Your lipid or cholesterol levels are high.  You are older than 59 years of age.  You are at high risk for heart disease. What should I know about cancer screening? Many types of cancers can be detected early and may often be prevented. Depending on your health history and family history, you may need to have cancer screening at various ages. This may include screening for:  Colorectal cancer.  Prostate  cancer.  Skin cancer.  Lung cancer. What should I know about heart disease, diabetes, and high blood pressure? Blood pressure and heart disease  High blood pressure causes heart disease and increases the risk of stroke. This is more likely to develop in people who have high blood pressure readings, are of African descent, or are overweight.  Talk with your health care provider about your target blood pressure readings.  Have your blood pressure checked: ? Every 3-5 years if you are 66-65 years of age. ? Every year if you are 27 years old or older.  If you are between the ages of 37 and 10 and are a current or former smoker, ask your health care provider if you should have a one-time screening for abdominal aortic aneurysm (AAA). Diabetes Have regular diabetes screenings. This checks your fasting blood sugar level. Have the screening done:  Once every three years after age 77 if you are at a normal weight and have a low risk for diabetes.  More often and at a younger age if you are overweight or have a high risk for diabetes. What should I know about preventing infection? Hepatitis B If you have a higher risk for hepatitis B, you should be screened for this virus. Talk with your health care provider to find out if you are at risk for hepatitis B infection. Hepatitis C Blood testing is recommended for:  Everyone born from [birth year] through 1964.  Anyone with known risk factors for hepatitis C. Sexually transmitted infections (STIs)  You should be  screened each year for STIs, including gonorrhea and chlamydia, if: ? You are sexually active and are younger than 59 years of age. ? You are older than 59 years of age and your health care provider tells you that you are at risk for this type of infection. ? Your sexual activity has changed since you were last screened, and you are at increased risk for chlamydia or gonorrhea. Ask your health care provider if you are at risk.  Ask your  health care provider about whether you are at high risk for HIV. Your health care provider may recommend a prescription medicine to help prevent HIV infection. If you choose to take medicine to prevent HIV, you should first get tested for HIV. You should then be tested every 3 months for as long as you are taking the medicine. Follow these instructions at home: Lifestyle  Do not use any products that contain nicotine or tobacco, such as cigarettes, e-cigarettes, and chewing tobacco. If you need help quitting, ask your health care provider.  Do not use street drugs.  Do not share needles.  Ask your health care provider for help if you need support or information about quitting drugs. Alcohol use  Do not drink alcohol if your health care provider tells you not to drink.  If you drink alcohol: ? Limit how much you have to 0-2 drinks a day. ? Be aware of how much alcohol is in your drink. In the [place] one drink equals one 12 oz bottle of beer (355 mL), one 5 oz glass of wine (148 mL), or one 1 oz glass of hard liquor (44 mL). General instructions  Schedule regular health, dental, and eye exams.  Stay current with your vaccines.  Tell your health care provider if: ? You often feel depressed. ? You have ever been abused or do not feel safe at home. Summary  Adopting a healthy lifestyle and getting preventive care are important in promoting health and wellness.  Follow your health care provider's instructions about healthy diet, exercising, and getting tested or screened for diseases.  Follow your health care provider's instructions on monitoring your cholesterol and blood pressure. This information is not intended to replace advice given to you by your health care provider. Make sure you discuss any questions you have with your health care provider. Document Revised: 11/11/2018 Document Reviewed: 11/11/2018 Elsevier Patient Education  2021 [employer].   If you have lab work done  today you will be contacted with your lab results within the next 2 weeks.  If you have not heard from [place] then please contact us. The fastest way to get your results is to register for My Chart.   IF you received an x-ray today, you will receive an invoice from [facility]. Please contact [facility] at [phone] with questions or concerns regarding your invoice.   IF you received labwork today, you will receive an invoice from [facility]. Please contact [facility] at [phone] with questions or concerns regarding your invoice.   Our billing staff will not be able to assist you with questions regarding bills from these companies.  You will be contacted with the lab results as soon as they are available. The fastest way to get your results is to activate your My Chart account. Instructions are located on the last page of this paperwork. If you have not heard from [place] regarding the results in 2 weeks, please contact this office.

## 2021-02-19 NOTE — Progress Notes (Signed)
3/21/20223:05 PM  Jerry Knight 11-02-1961, 60 y.o., male [id number]  Chief Complaint  Patient presents with  . Follow-up    Pt here for follow up doing well no concerns., needs some medications refilled today     HPI:   Patient is a 60 y.o. male with past medical history significant for RA who presents today for chronic care follow up.  Overall things going well No acute issues at this time Recently had a 90th bday party for this mom Needs medication refills  Pre-diabetes Lab Results  Component Value Date   HGBA1C 5.8 (H) 10/12/2020     HLD Started Atorvastatin 20 mg daily last OV Never received this medication Would be interested in starting Lab Results  Component Value Date   CHOL 193 10/12/2020   HDL 41 10/12/2020   LDLCALC 141 (H) 10/12/2020   TRIG 61 10/12/2020   CHOLHDL 4.7 10/12/2020     Long term opiate use Suboxone every 3-5 days Helps with pain management  Rheumatology follows Enbrel for shoulder and elbow Keeps steroids for flare ups of pain  Seasonal sinus issues: sees ENT Audiology to get new hearing aids  Has issues with stomach adhesions Regularly follows up with GI to prevent blockages    Depression screen [facility] 2/9 10/12/2020 05/03/2020 09/06/2019  Decreased Interest 0 0 0  Down, Depressed, Hopeless 0 0 0  PHQ - 2 Score 0 0 0    Fall Risk  02/19/2021 10/12/2020 05/03/2020 09/06/2019 05/27/2019  Falls in the past year? 0 0 0 0 0  Number falls in past yr: - 0 0 0 0  Injury with Fall? - 0 0 0 0  Risk for fall due to : - No Fall Risks - - -  Follow up Falls evaluation completed Falls evaluation completed Falls evaluation completed;Education provided - -     Allergies  Allergen Reactions  . Ambien [Zolpidem Tartrate] Other (See Comments)    Black out  . Iohexol     Pt claims he has chest tightness and sob after iv contrast injections.  . Seroquel [Quetiapine Fumarate]     Blackout  . Sonata [Zaleplon]     "black out'  confused     Prior to Admission medications   Medication Sig Start Date End Date Taking? Authorizing Provider  amoxicillin-clavulanate (AUGMENTIN) 875-125 MG tablet Take 1 tablet by mouth 2 (two) times daily. 04/21/20  Yes Rutherford Guys, MD  atorvastatin (LIPITOR) 10 MG tablet Take 1 tablet (10 mg total) by mouth daily. 05/27/19  Yes Rutherford Guys, MD  buprenorphine-naloxone (SUBOXONE) 8-2 mg SUBL SL tablet Place 1 tablet under the tongue daily.   Yes [provider]  cyclobenzaprine (FLEXERIL) 10 MG tablet Take 1 tablet (10 mg total) by mouth 3 (three) times daily. 04/21/20  Yes Rutherford Guys, MD  etanercept (ENBREL) 50 MG/ML injection Inject 50 mg into the skin once a week.   Yes [provider]  linaclotide Rolan Lipa) 145 MCG CAPS capsule Take 1 capsule (145 mcg total) by mouth daily before breakfast. 04/21/20  Yes Rutherford Guys, MD  mirtazapine (REMERON) 45 MG tablet Take 1 tablet (45 mg total) by mouth at bedtime. 10/09/20  Yes Guilianna Mckoy, Laurita Quint, FNP  omeprazole (PRILOSEC) 20 MG capsule TAKE 1 CAPSULE(20 MG) BY MOUTH DAILY 05/19/20  Yes Rutherford Guys, MD  traZODone (DESYREL) 50 MG tablet Take 3-4 tablets (150-200 mg total) by mouth at bedtime. 04/21/20  Yes Rutherford Guys, MD  Vitamin D, Ergocalciferol, (DRISDOL) 1.25 MG (50000 UT) CAPS capsule Take 1 capsule (50,000 Units total) by mouth every 7 (seven) days. 05/07/19  Yes Rutherford Guys, MD    Past Medical History:  Diagnosis Date  . Blood transfusion without reported diagnosis   . Chest pain   . Chronic neck pain   . Chronic pain   . Chronic pancreatitis ([place])   . COPD (chronic obstructive pulmonary disease) ([place])   . Degenerative disk disease    C 5 &C 6  . Depression   . GI bleed   . Gout   . Migraine   . Migraine headache   . Pain management contract agreement   . Seizure disorder ([place])    x 7 years  . Seizures ([place])   . Substance abuse ([place])    Has been cleaned for 18 mos  . Zollinger-Ellison syndrome      Past Surgical History:  Procedure Laterality Date  . APPENDECTOMY     per CT 2012- s/p appendectomy  . CHOLECYSTECTOMY    . EYE SURGERY    . HERNIA REPAIR    . PARTIAL GASTRECTOMY    . SMALL INTESTINE SURGERY      Social History   Tobacco Use  . Smoking status: Current Every Day Smoker    Packs/day: 2.00    Types: Cigarettes  . Smokeless tobacco: Never Used  . Tobacco comment: about 2 packs a day  Substance Use Topics  . Alcohol use: No    Family History  Problem Relation Age of Onset  . Angina Mother   . Nephrolithiasis Father   . Cancer Brother     Review of Systems  Constitutional: Negative for chills, fever and malaise/fatigue.  HENT: Positive for hearing loss. Negative for ear discharge, ear pain and tinnitus.   Eyes: Negative for blurred vision and double vision.  Respiratory: Negative for cough, shortness of breath and wheezing.   Cardiovascular: Negative for chest pain, palpitations and leg swelling.  Gastrointestinal: Positive for abdominal pain and diarrhea. Negative for blood in stool, constipation, heartburn, nausea and vomiting.  Genitourinary: Negative for dysuria, frequency and hematuria.  Musculoskeletal: Positive for joint pain. Negative for back pain.  Skin: Negative for rash.  Neurological: Negative for dizziness, tingling, weakness and headaches.     OBJECTIVE:  Today's Vitals   02/19/21 1443  BP: 101/73  Pulse: (!) 102  Resp: 15  Temp: 98.3 F (36.8 C)  TempSrc: Temporal  SpO2: 96%  Weight: 115 lb 3.2 oz (52.3 kg)  Height: 5\' 5"  (1.651 m)   Body mass index is 19.17 kg/m.   Physical Exam Vitals reviewed.  Constitutional:      Appearance: Normal appearance.  HENT:     Head: Normocephalic and atraumatic.  Eyes:     Conjunctiva/sclera: Conjunctivae normal.     Pupils: Pupils are equal, round, and reactive to light.  Cardiovascular:     Rate and Rhythm: Normal rate and regular rhythm.     Pulses: Normal pulses.     Heart  sounds: Normal heart sounds. No murmur heard. No friction rub. No gallop.   Pulmonary:     Effort: Pulmonary effort is normal. No respiratory distress.     Breath sounds: Normal breath sounds. No stridor. No wheezing or rales.  Abdominal:     General: Bowel sounds are normal.     Palpations: Abdomen is soft.     Tenderness: There is no abdominal tenderness.  Musculoskeletal:  General: No swelling. Normal range of motion.     Right lower leg: No edema.     Left lower leg: No edema.  Skin:    General: Skin is warm and dry.  Neurological:     General: No focal deficit present.     Mental Status: He is alert and oriented to person, place, and time.  Psychiatric:        Mood and Affect: Mood normal.        Behavior: Behavior normal.     No results found for this or any previous visit (from the past 24 hour(s)).  No results found.   ASSESSMENT and PLAN  Problem List Items Addressed This Visit      Other   Chronic neck and back pain   Relevant Medications   cyclobenzaprine (FLEXERIL) 10 MG tablet   traZODone (DESYREL) 50 MG tablet   mirtazapine (REMERON) 45 MG tablet   Psychophysiological insomnia   Relevant Medications   mirtazapine (REMERON) 45 MG tablet    Other Visit Diagnoses    Gastroesophageal reflux disease without esophagitis    -  Primary   Relevant Medications   linaclotide (LINZESS) 145 MCG CAPS capsule   omeprazole (PRILOSEC) 20 MG capsule   H/O laparoscopic partial gastrectomy       Relevant Medications   linaclotide (LINZESS) 145 MCG CAPS capsule   Pure hypercholesterolemia       Relevant Medications   atorvastatin (LIPITOR) 20 MG tablet         Medication refills sent Chronic conditions stable on current regimens   Return in about 6 months (around 08/22/2021).    Huston Foley Zayn Selley, FNP-BC [facility] [place], [place] [address] Ph.  [phone] Fax [phone]

## 2021-02-20 DIAGNOSIS — F1120 Opioid dependence, uncomplicated: Secondary | ICD-10-CM | POA: Diagnosis not present

## 2021-02-20 DIAGNOSIS — F1320 Sedative, hypnotic or anxiolytic dependence, uncomplicated: Secondary | ICD-10-CM | POA: Diagnosis not present

## 2021-02-20 DIAGNOSIS — F1021 Alcohol dependence, in remission: Secondary | ICD-10-CM | POA: Diagnosis not present

## 2021-02-20 DIAGNOSIS — F1220 Cannabis dependence, uncomplicated: Secondary | ICD-10-CM | POA: Diagnosis not present

## 2021-02-22 DIAGNOSIS — F1120 Opioid dependence, uncomplicated: Secondary | ICD-10-CM | POA: Diagnosis not present

## 2021-03-08 DIAGNOSIS — F1021 Alcohol dependence, in remission: Secondary | ICD-10-CM | POA: Diagnosis not present

## 2021-03-08 DIAGNOSIS — F1320 Sedative, hypnotic or anxiolytic dependence, uncomplicated: Secondary | ICD-10-CM | POA: Diagnosis not present

## 2021-03-08 DIAGNOSIS — F1120 Opioid dependence, uncomplicated: Secondary | ICD-10-CM | POA: Diagnosis not present

## 2021-03-08 DIAGNOSIS — F1220 Cannabis dependence, uncomplicated: Secondary | ICD-10-CM | POA: Diagnosis not present

## 2021-03-23 DIAGNOSIS — F1120 Opioid dependence, uncomplicated: Secondary | ICD-10-CM | POA: Diagnosis not present

## 2021-03-26 DIAGNOSIS — J3489 Other specified disorders of nose and nasal sinuses: Secondary | ICD-10-CM | POA: Diagnosis not present

## 2021-03-26 DIAGNOSIS — H9190 Unspecified hearing loss, unspecified ear: Secondary | ICD-10-CM | POA: Diagnosis not present

## 2021-03-26 DIAGNOSIS — F17210 Nicotine dependence, cigarettes, uncomplicated: Secondary | ICD-10-CM | POA: Diagnosis not present

## 2021-03-26 DIAGNOSIS — R040 Epistaxis: Secondary | ICD-10-CM | POA: Diagnosis not present

## 2021-03-26 DIAGNOSIS — J310 Chronic rhinitis: Secondary | ICD-10-CM | POA: Diagnosis not present

## 2021-03-26 DIAGNOSIS — J329 Chronic sinusitis, unspecified: Secondary | ICD-10-CM | POA: Diagnosis not present

## 2021-03-26 DIAGNOSIS — K219 Gastro-esophageal reflux disease without esophagitis: Secondary | ICD-10-CM | POA: Diagnosis not present

## 2021-03-26 DIAGNOSIS — J324 Chronic pansinusitis: Secondary | ICD-10-CM | POA: Diagnosis not present

## 2021-04-05 DIAGNOSIS — F1120 Opioid dependence, uncomplicated: Secondary | ICD-10-CM | POA: Diagnosis not present

## 2021-04-06 DIAGNOSIS — J310 Chronic rhinitis: Secondary | ICD-10-CM | POA: Diagnosis not present

## 2021-04-12 DIAGNOSIS — F1120 Opioid dependence, uncomplicated: Secondary | ICD-10-CM | POA: Diagnosis not present

## 2021-04-12 DIAGNOSIS — F1320 Sedative, hypnotic or anxiolytic dependence, uncomplicated: Secondary | ICD-10-CM | POA: Diagnosis not present

## 2021-04-16 DIAGNOSIS — F1320 Sedative, hypnotic or anxiolytic dependence, uncomplicated: Secondary | ICD-10-CM | POA: Diagnosis not present

## 2021-04-16 DIAGNOSIS — F1120 Opioid dependence, uncomplicated: Secondary | ICD-10-CM | POA: Diagnosis not present

## 2021-05-01 DIAGNOSIS — F1120 Opioid dependence, uncomplicated: Secondary | ICD-10-CM | POA: Diagnosis not present

## 2021-05-04 IMAGING — CT CT CHEST LUNG CANCER SCREENING LOW DOSE
2 of 5 series · 15 of 40 positions shown, 18 images · non-contrast
Comparison: Plain films of 05/30/2018. No prior screening CT.
Diagnostic CT of 01/21/2013

CLINICAL DATA: Eighty-eight pack-year smoking history. Current
smoker.

EXAM:
CT CHEST WITHOUT CONTRAST LOW-DOSE FOR LUNG CANCER SCREENING
TECHNIQUE: Multidetector CT imaging of the chest was performed following the
standard protocol without IV contrast.

[Series 4: lung 1.00 br44 cor · coronal · 0.63mm/px · 3 of 249 slices shown]
[im 50/249  lung]
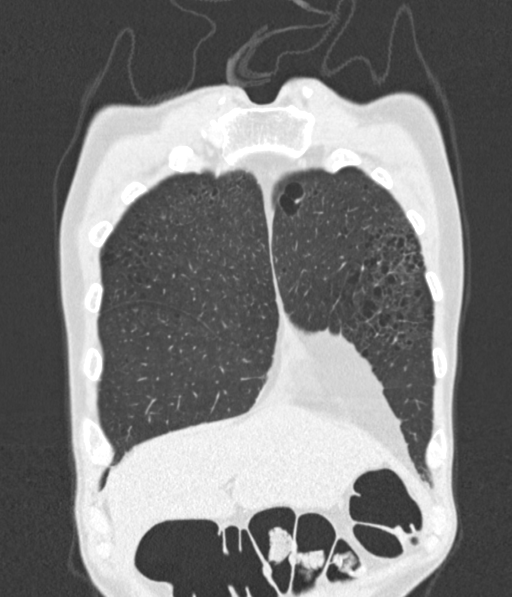
[im 100/249  lung]
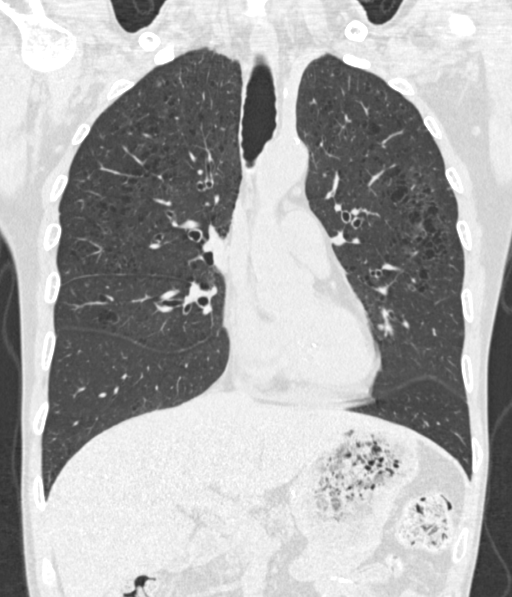
[im 149/249  lung]
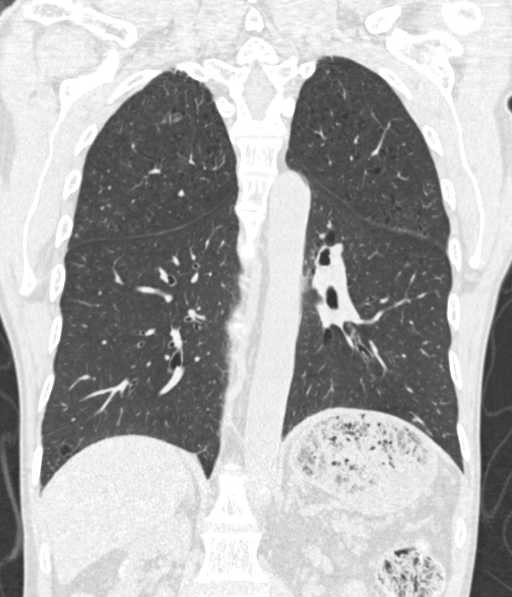

[Series 9: lung 1.00 br60 · axial · 0.63mm/px · z∈[-1140,-801]mm · 12 of 373 slices shown, 15 images]
[im 17/373  mediastinal]
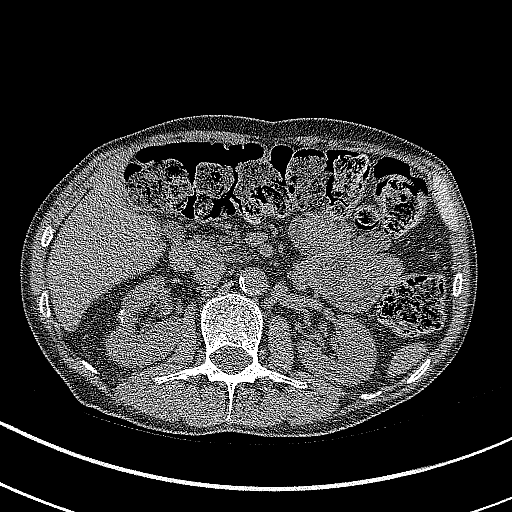
[im 17/373  lung]
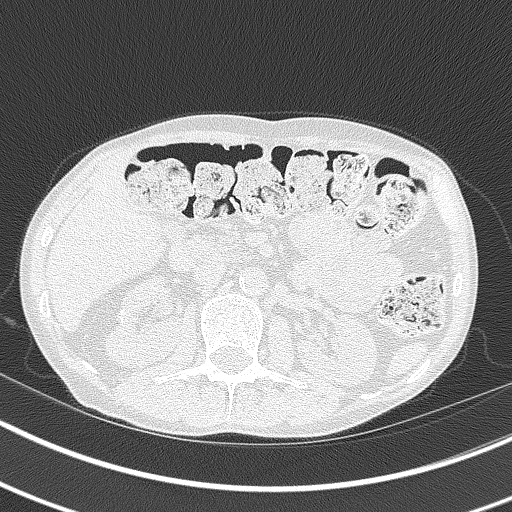
[im 51/373  lung]
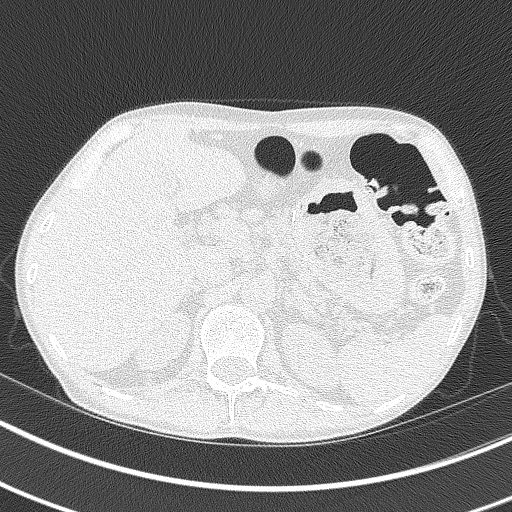
[im 85/373  lung]
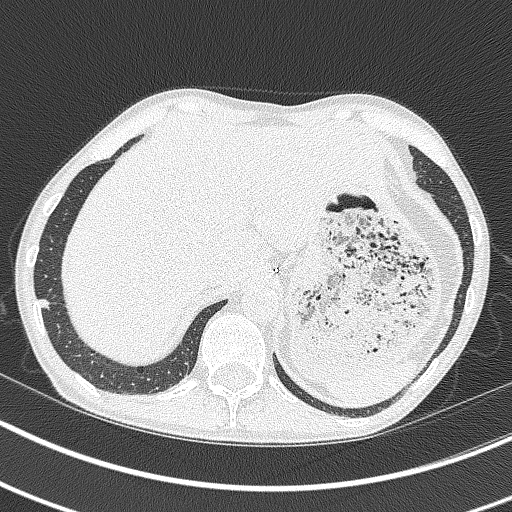
[im 119/373  lung]
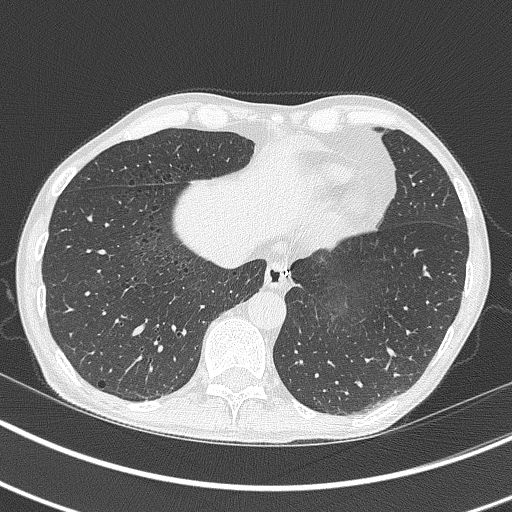
[im 136/373  mediastinal]
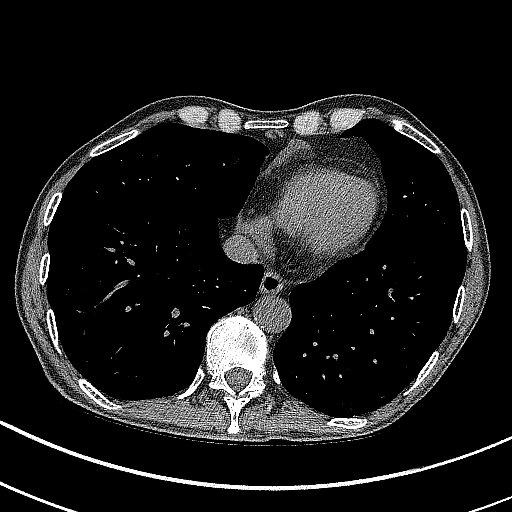
[im 136/373  lung]
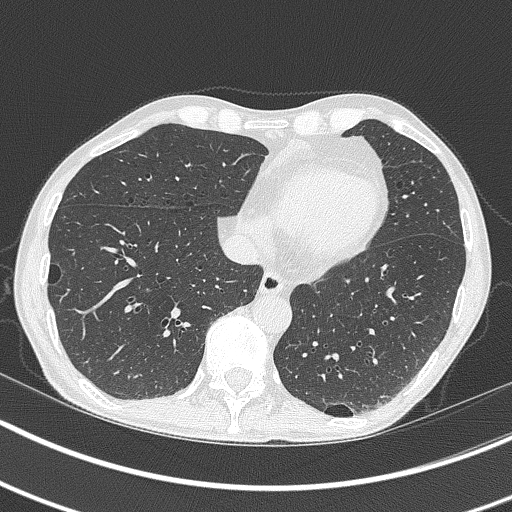
[im 170/373  lung]
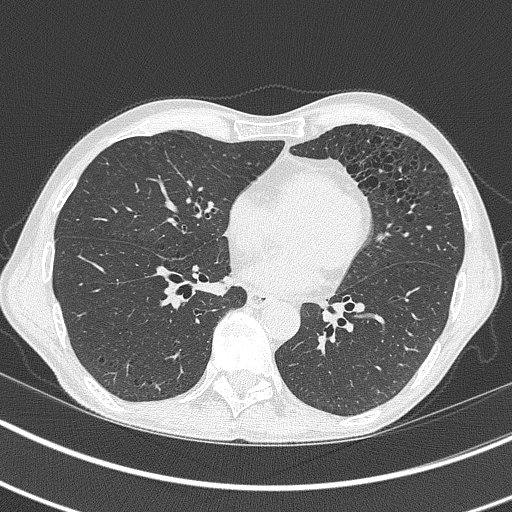
[im 203/373  lung]
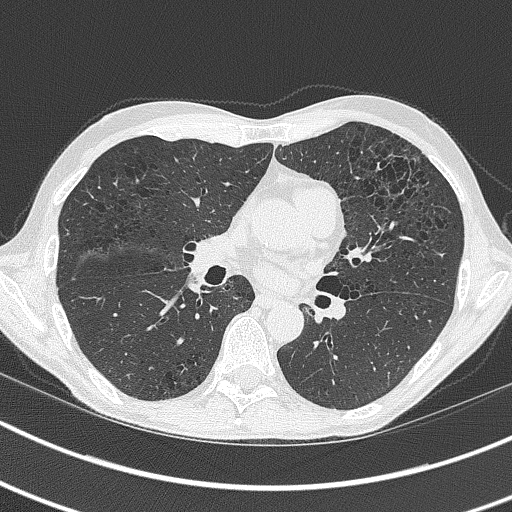
[im 237/373  lung]
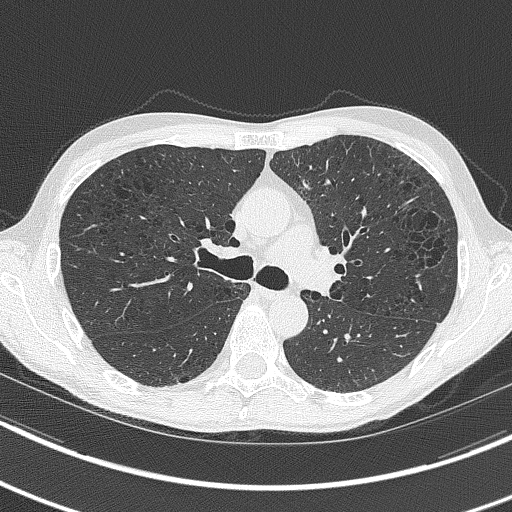
[im 254/373  mediastinal]
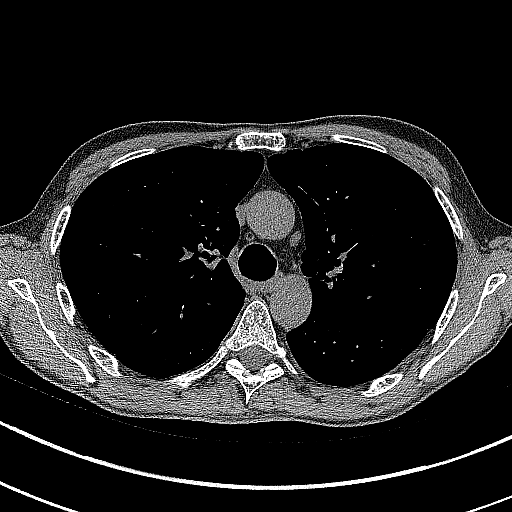
[im 254/373  lung]
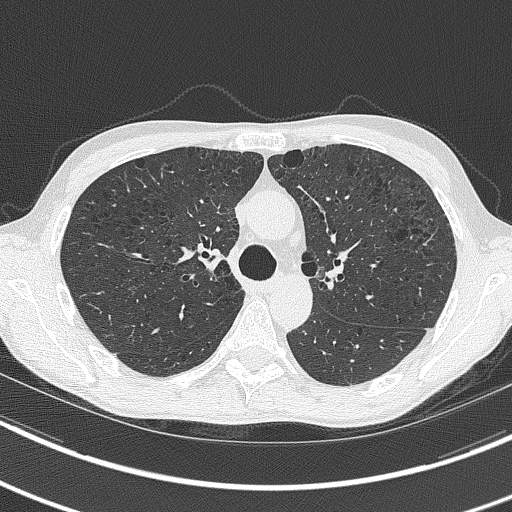
[im 288/373  lung]
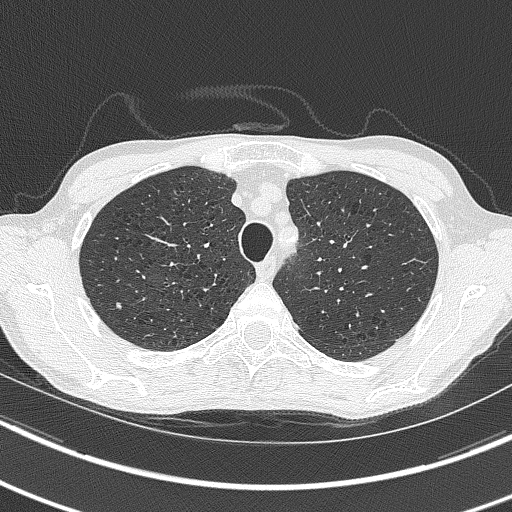
[im 322/373  lung]
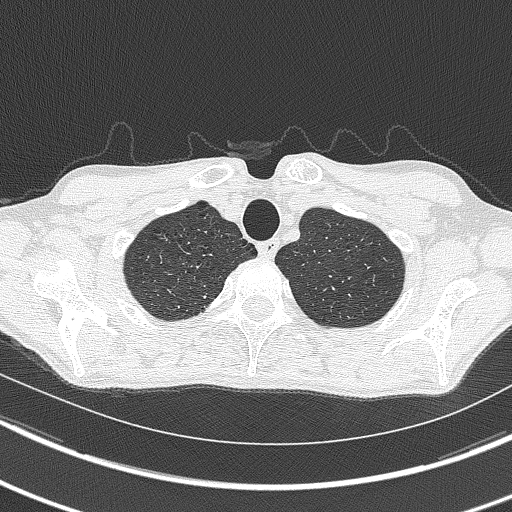
[im 356/373  lung]
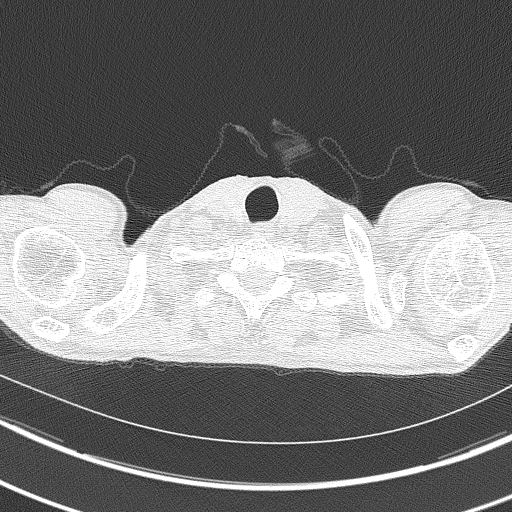

[15 of 40 positions shown; findings below may reference images not displayed]

FINDINGS: Cardiovascular: Aortic and branch vessel atherosclerosis. Normal
heart size with minimal anterior pericardial fluid or thickening.
Lad coronary artery calcification.

Mediastinum/Nodes: No mediastinal or definite hilar adenopathy,
given limitations of unenhanced CT. Surgical changes at the
gastroesophageal junction.

Lungs/Pleura: No pleural fluid. Moderate centrilobular emphysema.
Mild biapical pleuroparenchymal scarring.

Bilateral pulmonary nodules which are primarily subpleural in
distribution. The largest nodule is in the right lower lobe at
volume derived equivalent diameter 8.8 mm. A more medial right lower
lobe subpleural pulmonary nodule measures volume derived equivalent
diameter 6.6 mm. These nodules were both present on 01/21/2013.

Upper Abdomen: Normal imaged portions of the liver, spleen,
pancreas, gallbladder, adrenal glands. Surgical changes also
identified about the gastric body, consistent with partial
gastrectomy.

Punctate interpolar left renal collecting system calculus. Right
renal parenchymal calcification and volume loss, suggesting scarring
and dystrophic calcification versus collecting system stone. Example
at 6 mm on image 71/2.

Musculoskeletal: No acute osseous abnormality.
IMPRESSION: 1. Lung-RADS 2, benign appearance or behavior. Continue annual
screening with low-dose chest CT without contrast in 12 months.
2. Left nephrolithiasis. Right renal scarring and dystrophic
calcification versus collecting system stone.
3. Lad coronary artery atherosclerosis.

Aortic Atherosclerosis (RC8US-MS2.2) and Emphysema (RC8US-7XY.O).

## 2021-05-13 DIAGNOSIS — F1320 Sedative, hypnotic or anxiolytic dependence, uncomplicated: Secondary | ICD-10-CM | POA: Diagnosis not present

## 2021-05-13 DIAGNOSIS — F1120 Opioid dependence, uncomplicated: Secondary | ICD-10-CM | POA: Diagnosis not present

## 2021-05-22 DIAGNOSIS — F1320 Sedative, hypnotic or anxiolytic dependence, uncomplicated: Secondary | ICD-10-CM | POA: Diagnosis not present

## 2021-05-22 DIAGNOSIS — F1120 Opioid dependence, uncomplicated: Secondary | ICD-10-CM | POA: Diagnosis not present

## 2021-05-23 DIAGNOSIS — F1120 Opioid dependence, uncomplicated: Secondary | ICD-10-CM | POA: Diagnosis not present

## 2021-05-24 DIAGNOSIS — J310 Chronic rhinitis: Secondary | ICD-10-CM | POA: Diagnosis not present

## 2021-05-31 DIAGNOSIS — F1320 Sedative, hypnotic or anxiolytic dependence, uncomplicated: Secondary | ICD-10-CM | POA: Diagnosis not present

## 2021-05-31 DIAGNOSIS — F1120 Opioid dependence, uncomplicated: Secondary | ICD-10-CM | POA: Diagnosis not present

## 2021-06-14 DIAGNOSIS — F1120 Opioid dependence, uncomplicated: Secondary | ICD-10-CM | POA: Diagnosis not present

## 2021-06-14 DIAGNOSIS — F1320 Sedative, hypnotic or anxiolytic dependence, uncomplicated: Secondary | ICD-10-CM | POA: Diagnosis not present

## 2021-06-21 DIAGNOSIS — F1120 Opioid dependence, uncomplicated: Secondary | ICD-10-CM | POA: Diagnosis not present

## 2021-06-21 DIAGNOSIS — F1320 Sedative, hypnotic or anxiolytic dependence, uncomplicated: Secondary | ICD-10-CM | POA: Diagnosis not present

## 2021-07-05 DIAGNOSIS — F1120 Opioid dependence, uncomplicated: Secondary | ICD-10-CM | POA: Diagnosis not present

## 2021-08-01 DIAGNOSIS — Z681 Body mass index (BMI) 19 or less, adult: Secondary | ICD-10-CM | POA: Diagnosis not present

## 2021-08-01 DIAGNOSIS — M2550 Pain in unspecified joint: Secondary | ICD-10-CM | POA: Diagnosis not present

## 2021-08-01 DIAGNOSIS — M0579 Rheumatoid arthritis with rheumatoid factor of multiple sites without organ or systems involvement: Secondary | ICD-10-CM | POA: Diagnosis not present

## 2021-08-01 DIAGNOSIS — M25562 Pain in left knee: Secondary | ICD-10-CM | POA: Diagnosis not present

## 2021-08-01 DIAGNOSIS — M25521 Pain in right elbow: Secondary | ICD-10-CM | POA: Diagnosis not present

## 2021-08-01 DIAGNOSIS — M25561 Pain in right knee: Secondary | ICD-10-CM | POA: Diagnosis not present

## 2021-08-01 DIAGNOSIS — Z79899 Other long term (current) drug therapy: Secondary | ICD-10-CM | POA: Diagnosis not present

## 2021-08-07 DIAGNOSIS — Z1159 Encounter for screening for other viral diseases: Secondary | ICD-10-CM | POA: Diagnosis not present

## 2021-08-07 DIAGNOSIS — Z87891 Personal history of nicotine dependence: Secondary | ICD-10-CM | POA: Diagnosis not present

## 2021-08-07 DIAGNOSIS — R9431 Abnormal electrocardiogram [ECG] [EKG]: Secondary | ICD-10-CM | POA: Diagnosis not present

## 2021-08-07 DIAGNOSIS — Z0000 Encounter for general adult medical examination without abnormal findings: Secondary | ICD-10-CM | POA: Diagnosis not present

## 2021-08-07 DIAGNOSIS — E559 Vitamin D deficiency, unspecified: Secondary | ICD-10-CM | POA: Diagnosis not present

## 2021-08-07 DIAGNOSIS — Z131 Encounter for screening for diabetes mellitus: Secondary | ICD-10-CM | POA: Diagnosis not present

## 2021-08-07 DIAGNOSIS — R350 Frequency of micturition: Secondary | ICD-10-CM | POA: Diagnosis not present

## 2021-08-07 DIAGNOSIS — R5383 Other fatigue: Secondary | ICD-10-CM | POA: Diagnosis not present

## 2021-08-07 DIAGNOSIS — Z79899 Other long term (current) drug therapy: Secondary | ICD-10-CM | POA: Diagnosis not present

## 2021-08-07 DIAGNOSIS — R0602 Shortness of breath: Secondary | ICD-10-CM | POA: Diagnosis not present

## 2021-08-15 DIAGNOSIS — Z822 Family history of deafness and hearing loss: Secondary | ICD-10-CM | POA: Diagnosis not present

## 2021-08-15 DIAGNOSIS — H9313 Tinnitus, bilateral: Secondary | ICD-10-CM | POA: Diagnosis not present

## 2021-08-15 DIAGNOSIS — H903 Sensorineural hearing loss, bilateral: Secondary | ICD-10-CM | POA: Diagnosis not present

## 2021-08-15 DIAGNOSIS — Z570 Occupational exposure to noise: Secondary | ICD-10-CM | POA: Diagnosis not present

## 2021-08-22 DIAGNOSIS — R7303 Prediabetes: Secondary | ICD-10-CM | POA: Diagnosis not present

## 2021-08-22 DIAGNOSIS — E559 Vitamin D deficiency, unspecified: Secondary | ICD-10-CM | POA: Diagnosis not present

## 2021-08-22 DIAGNOSIS — R945 Abnormal results of liver function studies: Secondary | ICD-10-CM | POA: Diagnosis not present

## 2021-08-22 DIAGNOSIS — E7800 Pure hypercholesterolemia, unspecified: Secondary | ICD-10-CM | POA: Diagnosis not present

## 2021-08-23 DIAGNOSIS — Z87891 Personal history of nicotine dependence: Secondary | ICD-10-CM | POA: Diagnosis not present

## 2021-08-30 DIAGNOSIS — J310 Chronic rhinitis: Secondary | ICD-10-CM | POA: Diagnosis not present

## 2021-09-13 DIAGNOSIS — R945 Abnormal results of liver function studies: Secondary | ICD-10-CM | POA: Diagnosis not present

## 2021-09-18 DIAGNOSIS — J069 Acute upper respiratory infection, unspecified: Secondary | ICD-10-CM | POA: Diagnosis not present

## 2021-09-18 DIAGNOSIS — J029 Acute pharyngitis, unspecified: Secondary | ICD-10-CM | POA: Diagnosis not present

## 2021-09-18 DIAGNOSIS — Z1152 Encounter for screening for COVID-19: Secondary | ICD-10-CM | POA: Diagnosis not present

## 2021-09-24 DIAGNOSIS — R9431 Abnormal electrocardiogram [ECG] [EKG]: Secondary | ICD-10-CM | POA: Diagnosis not present

## 2021-09-25 DIAGNOSIS — R945 Abnormal results of liver function studies: Secondary | ICD-10-CM | POA: Diagnosis not present

## 2021-09-25 DIAGNOSIS — Z23 Encounter for immunization: Secondary | ICD-10-CM | POA: Diagnosis not present

## 2021-10-31 DIAGNOSIS — J441 Chronic obstructive pulmonary disease with (acute) exacerbation: Secondary | ICD-10-CM | POA: Diagnosis not present

## 2021-10-31 DIAGNOSIS — R059 Cough, unspecified: Secondary | ICD-10-CM | POA: Diagnosis not present

## 2021-11-06 DIAGNOSIS — R059 Cough, unspecified: Secondary | ICD-10-CM | POA: Diagnosis not present

## 2021-11-06 DIAGNOSIS — F17210 Nicotine dependence, cigarettes, uncomplicated: Secondary | ICD-10-CM | POA: Diagnosis not present

## 2021-11-06 DIAGNOSIS — F17200 Nicotine dependence, unspecified, uncomplicated: Secondary | ICD-10-CM | POA: Diagnosis not present

## 2021-11-09 ENCOUNTER — Encounter (HOSPITAL_BASED_OUTPATIENT_CLINIC_OR_DEPARTMENT_OTHER): Payer: Self-pay | Admitting: *Deleted

## 2021-11-09 ENCOUNTER — Emergency Department (HOSPITAL_BASED_OUTPATIENT_CLINIC_OR_DEPARTMENT_OTHER): Payer: Medicare HMO

## 2021-11-09 ENCOUNTER — Emergency Department (HOSPITAL_BASED_OUTPATIENT_CLINIC_OR_DEPARTMENT_OTHER)
Admission: EM | Admit: 2021-11-09 | Discharge: 2021-11-09 | Disposition: A | Discharge status: Home / Self Care | Payer: Medicare HMO | Attending: Student | Admitting: Student

## 2021-11-09 ENCOUNTER — Other Ambulatory Visit: Payer: Self-pay

## 2021-11-09 DIAGNOSIS — J209 Acute bronchitis, unspecified: Secondary | ICD-10-CM | POA: Insufficient documentation

## 2021-11-09 DIAGNOSIS — R059 Cough, unspecified: Secondary | ICD-10-CM | POA: Diagnosis not present

## 2021-11-09 DIAGNOSIS — R0602 Shortness of breath: Secondary | ICD-10-CM | POA: Diagnosis not present

## 2021-11-09 DIAGNOSIS — I700 Atherosclerosis of aorta: Secondary | ICD-10-CM | POA: Diagnosis not present

## 2021-11-09 DIAGNOSIS — F17210 Nicotine dependence, cigarettes, uncomplicated: Secondary | ICD-10-CM | POA: Diagnosis not present

## 2021-11-09 DIAGNOSIS — J40 Bronchitis, not specified as acute or chronic: Secondary | ICD-10-CM | POA: Diagnosis not present

## 2021-11-09 DIAGNOSIS — J439 Emphysema, unspecified: Secondary | ICD-10-CM | POA: Diagnosis not present

## 2021-11-09 DIAGNOSIS — R0789 Other chest pain: Secondary | ICD-10-CM | POA: Diagnosis not present

## 2021-11-09 LAB — CBC WITH DIFFERENTIAL/PLATELET
Abs Immature Granulocytes: 0.18 10*3/uL — ABNORMAL HIGH (ref 0.00–0.07)
Basophils Absolute: 0.1 10*3/uL (ref 0.0–0.1)
Basophils Relative: 0 %
Eosinophils Absolute: 0.2 10*3/uL (ref 0.0–0.5)
Eosinophils Relative: 1 %
HCT: 41.6 % (ref 39.0–52.0)
Hemoglobin: 14.1 g/dL (ref 13.0–17.0)
Immature Granulocytes: 1 %
Lymphocytes Relative: 25 %
Lymphs Abs: 4.6 10*3/uL — ABNORMAL HIGH (ref 0.7–4.0)
MCH: 31.1 pg (ref 26.0–34.0)
MCHC: 33.9 g/dL (ref 30.0–36.0)
MCV: 91.8 fL (ref 80.0–100.0)
Monocytes Absolute: 1.2 10*3/uL — ABNORMAL HIGH (ref 0.1–1.0)
Monocytes Relative: 7 %
Neutro Abs: 11.7 10*3/uL — ABNORMAL HIGH (ref 1.7–7.7)
Neutrophils Relative %: 66 %
Platelets: 260 10*3/uL (ref 150–400)
RBC: 4.53 MIL/uL (ref 4.22–5.81)
RDW: 13.4 % (ref 11.5–15.5)
WBC: 17.9 10*3/uL — ABNORMAL HIGH (ref 4.0–10.5)
nRBC: 0 % (ref 0.0–0.2)

## 2021-11-09 LAB — BASIC METABOLIC PANEL
Anion gap: 10 (ref 5–15)
BUN: 20 mg/dL (ref 6–20)
CO2: 24 mmol/L (ref 22–32)
Calcium: 9.1 mg/dL (ref 8.9–10.3)
Chloride: 101 mmol/L (ref 98–111)
Creatinine, Ser: 0.68 mg/dL (ref 0.61–1.24)
GFR, Estimated: >60 mL/min (ref >60)
Glucose, Bld: 90 mg/dL (ref 70–99)
Potassium: 3.7 mmol/L (ref 3.5–5.1)
Sodium: 135 mmol/L (ref 135–145)

## 2021-11-09 NOTE — Discharge Instructions (Signed)
As we discussed recommend that you use your budesonide inhaler in the morning and the evening at home every day, you can use the albuterol inhaler as needed in addition to the above if you are feeling short of breath.  You can continue to use the cough suppressant medication as needed if you are having pain and irritation with coughing.  Please return if you begin to experiencing significantly worsening shortness of breath, chest pain, fever that does not respond to ibuprofen, Tylenol despite treatment as above.  Please follow-up with your primary care provider for further evaluation discussion of your ongoing COPD.  As we discussed I do recommend that you discontinue your tobacco use as it is likely contributing to your difficulty clearing this respiratory infection.  You can talk to your primary care for additional resources to help you with your tobacco use, however I have attached a document with some strategies to help you quit.

## 2021-11-09 NOTE — ED Provider Notes (Signed)
[facility] EMERGENCY DEPARTMENT Provider Note   CSN: [id number] Arrival date & time: 11/09/21  1046     History Chief Complaint  Patient presents with   Cough    Jerry Knight is a 60 y.o. male with a history of COPD who presents for evaluation of ongoing 2 weeks of cough, congestion, shortness of breath.  Patient reports that he seen his primary care doctor 3 times, prescribed steroids, Z-Pak with no relief.  Patient has had negative COVID, flu testing, negative chest x-ray.  Patient is worried that he may be developing a pneumonia.  Patient with known COPD that he does not treat as well as tobacco smoking with 1-1/2 pack/day usage.  Patient reports no decrease in tobacco smoking since becoming ill.  Patient reports he has budesonide, as well as albuterol at home, however he does not use them regularly.  Patient reports the cough is wet, with secretions, secretions are white to clear in nature, worse in the morning and at night.  Patient reports that nothing makes the cough better.   Cough     Past Medical History:  Diagnosis Date   Blood transfusion without reported diagnosis    Chest pain    Chronic neck pain    Chronic pain    Chronic pancreatitis ([facility])    COPD (chronic obstructive pulmonary disease) ([facility])    Degenerative disk disease    C 5 &C 6   Depression    GI bleed    Gout    Migraine    Migraine headache    Pain management contract agreement    Seizure disorder ([place])    x 7 years   Seizures ([place])    Substance abuse ([place])    Has been cleaned for 18 mos   Zollinger-Ellison syndrome     Patient Active Problem List   Diagnosis Date Noted   Constipation 04/21/2020   Chronic neck and back pain 04/21/2020   Psychophysiological insomnia 04/21/2020   Opioid dependence on maintenance agonist therapy, no symptoms ([place]) 02/01/2019   Loss of weight 01/22/2013   Hypokalemia 01/22/2013   Substance abuse ([place]) 01/22/2013   Pancreatitis, chronic ([place])  08/27/2012   Abdominal pain, epigastric 08/27/2012   Chest pain 01/26/2012   SOB (shortness of breath) 01/26/2012    Past Surgical History:  Procedure Laterality Date   APPENDECTOMY     per CT 2012- s/p appendectomy   CHOLECYSTECTOMY     EYE SURGERY     HERNIA REPAIR     PARTIAL GASTRECTOMY     SMALL INTESTINE SURGERY         Family History  Problem Relation Age of Onset   Angina Mother    Nephrolithiasis Father    Cancer Brother     Social History   Tobacco Use   Smoking status: Every Day    Packs/day: 2.00    Types: Cigarettes   Smokeless tobacco: Never   Tobacco comments:    about 2 packs a day  Vaping Use   Vaping Use: Never used  Substance Use Topics   Alcohol use: No   Drug use: Yes    Types: Marijuana, Other-see comments    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    Home Medications Prior to Admission medications   Medication Sig Start Date End Date Taking? Authorizing Provider  atorvastatin (LIPITOR) 20 MG tablet Take 1 tablet (20 mg total) by mouth daily. 02/19/21   Just, Laurita Quint,  FNP  buprenorphine-naloxone (SUBOXONE) 8-2 mg SUBL SL tablet Place 1 tablet under the tongue daily.    [provider]  cyclobenzaprine (FLEXERIL) 10 MG tablet Take 1 tablet (10 mg total) by mouth 3 (three) times daily. 02/19/21   Just, Laurita Quint, FNP  etanercept (ENBREL) 50 MG/ML injection Inject 50 mg into the skin once a week.    [provider]  linaclotide Rolan Lipa) 145 MCG CAPS capsule Take 1 capsule (145 mcg total) by mouth daily before breakfast. 02/19/21   Just, Laurita Quint, FNP  mirtazapine (REMERON) 45 MG tablet Take 1 tablet (45 mg total) by mouth at bedtime. 02/19/21   Just, Laurita Quint, FNP  omeprazole (PRILOSEC) 20 MG capsule TAKE 1 CAPSULE(20 MG) BY MOUTH DAILY 02/19/21   Just, Laurita Quint, FNP  traZODone (DESYREL) 50 MG tablet Take 3-4 tablets (150-200 mg total) by mouth at bedtime. 02/19/21   Just, Laurita Quint, FNP    Allergies    Ambien  [zolpidem tartrate], Iohexol, Seroquel [quetiapine fumarate], and Sonata [zaleplon]  Review of Systems   Review of Systems  Respiratory:  Positive for cough.   All other systems reviewed and are negative.  Physical Exam Updated Vital Signs BP 131/79   Pulse 75   Temp 98 F (36.7 C) (Oral)   Resp 17   Ht 5\' 5"  (1.651 m)   Wt 52.2 kg   SpO2 97%   BMI 19.14 kg/m   Physical Exam Vitals and nursing note reviewed.  Constitutional:      General: He is not in acute distress.    Appearance: Normal appearance. He is not ill-appearing.  HENT:     Head: Normocephalic and atraumatic.  Eyes:     General:        Right eye: No discharge.        Left eye: No discharge.  Cardiovascular:     Rate and Rhythm: Normal rate and regular rhythm.     Heart sounds: No murmur heard.   No friction rub. No gallop.  Pulmonary:     Effort: Pulmonary effort is normal.     Breath sounds: Normal breath sounds.     Comments: Diffuse biphasic wheezing with deep inspiration.  Some rhonchi that clear with cough.  No area of focal consolidation.  No evidence of rales, stridor, respiratory distress.  No accessory muscle use.  Abdominal:     General: Bowel sounds are normal.     Palpations: Abdomen is soft.  Skin:    General: Skin is warm and dry.     Capillary Refill: Capillary refill takes less than 2 seconds.     Comments: There is mild clubbing noted on fingers with yellowing discoloration of fingertips  Neurological:     Mental Status: He is alert and oriented to person, place, and time.  Psychiatric:        Mood and Affect: Mood normal.        Behavior: Behavior normal.    ED Results / Procedures / Treatments   Labs (all labs ordered are listed, but only abnormal results are displayed) Labs Reviewed  CBC WITH DIFFERENTIAL/PLATELET - Abnormal; Notable for the following components:      Result Value   WBC 17.9 (*)    Neutro Abs 11.7 (*)    Lymphs Abs 4.6 (*)    Monocytes Absolute 1.2 (*)     Abs Immature Granulocytes 0.18 (*)    All other components within normal limits  BASIC METABOLIC PANEL  EKG None  Radiology DG Chest 2 View  Result Date: 11/09/2021 CLINICAL DATA:  60 year old male with history of shortness of breath, chest tightness and cough for the past 2 weeks. EXAM: CHEST - 2 VIEW COMPARISON:  Chest x-ray 05/30/2018. FINDINGS: Lung volumes are normal. Widespread areas of interstitial prominence and diffuse peribronchial cuffing. No consolidative airspace disease. No pleural effusions. No pneumothorax. No pulmonary nodule or mass noted. Pulmonary vasculature and the cardiomediastinal silhouette are within normal limits. Atherosclerosis in the thoracic aorta. IMPRESSION: 1. The appearance the chest suggests acute bronchitis, as above. 2. Aortic atherosclerosis. Electronically Signed   By: Vinnie Langton M.D.   On: 11/09/2021 14:16    Procedures Procedures   Medications Ordered in ED Medications - No data to display  ED Course  I have reviewed the triage vital signs and the nursing notes.  Pertinent labs & imaging results that were available during my care of the patient were reviewed by me and considered in my medical decision making (see chart for details).    MDM Rules/Calculators/A&P                         I discussed this case with my attending physician who cosigned this note including patient's presenting symptoms, physical exam, and planned diagnostics and interventions. Attending physician stated agreement with plan or made changes to plan which were implemented.   With the inpatient he was otherwise fairly well-appearing with stable vital signs presents with 2 weeks of persistent cough, concern for progression to pneumonia.  Patient has been seen by his primary care doctor 3 times, placed on a course of steroids that he finished around 1 week ago, also given a Z-Pak, also given Tessalon Perles for relief of cough.  Patient reports that none of the  treatments have helped, cough has been persistent.  Patient reports that cough is worse in the morning, and at night.  Patient reports that he thought there might be some shadowing on his chest x-ray that is concerning for pneumonia, based on his [place] interpretation.  Patient with clinical signs and symptoms of emphysema including known tobacco use of 1-1/2 packs a day for many years, thin appearance, large barrel chest, minimal finger clubbing.  Chest x-ray shows evidence of ongoing acute bronchitis.  Lab work is unremarkable other than a white blood cell count of 17.9 with a lymphocyte predominance.  No focal consolidation on chest x-ray, no fever, appearance of acute bronchitis on chest x-ray, and lymphocyte predominance of white blood cell count believe that this is elevated secondary to viral bronchitis.  As patient just received steroids we will hold off on oral steroid course at this time, however patient has prescriptions for budesonide, as well as albuterol that he uses at home, however he is not compliant.  Indication extensive counseling with patient on the importance of using his at home inhalers to help with the bronchitis, COPD overall.  Discussed the importance of tobacco cessation.  Encouraged close follow-up with primary care for following resolution of symptoms.  Return precautions given including worsening shortness of breath, chest pain.  Patient understands and agrees to plan, discharged in stable condition at this time. Final Clinical Impression(s) / ED Diagnoses Final diagnoses:  Acute bronchitis, unspecified organism  Pulmonary emphysema, unspecified emphysema type [facility])    Rx / DC Orders ED Discharge Orders     None        Anselmo Pickler, PA-C 11/09/21 1543  Teressa Lower, MD 11/09/21 [phone]

## 2021-11-09 NOTE — ED Notes (Signed)
Delay in xray. RN doing EKG and labs. Will return later

## 2021-11-09 NOTE — ED Triage Notes (Signed)
Cough x 12 days. He has been seen by his MD x 3 with negative CXR, flu, and Covid testing.

## 2021-11-09 NOTE — ED Notes (Signed)
Pt. Reports he has had flu like symptoms and a cough for 3-4 days.  Pt. Is a smoker.

## 2021-12-05 DIAGNOSIS — Z0000 Encounter for general adult medical examination without abnormal findings: Secondary | ICD-10-CM | POA: Diagnosis not present

## 2021-12-05 DIAGNOSIS — R7303 Prediabetes: Secondary | ICD-10-CM | POA: Diagnosis not present

## 2021-12-05 DIAGNOSIS — E559 Vitamin D deficiency, unspecified: Secondary | ICD-10-CM | POA: Diagnosis not present

## 2021-12-05 DIAGNOSIS — Z79899 Other long term (current) drug therapy: Secondary | ICD-10-CM | POA: Diagnosis not present

## 2021-12-05 DIAGNOSIS — J449 Chronic obstructive pulmonary disease, unspecified: Secondary | ICD-10-CM | POA: Diagnosis not present

## 2021-12-05 DIAGNOSIS — E7800 Pure hypercholesterolemia, unspecified: Secondary | ICD-10-CM | POA: Diagnosis not present

## 2021-12-21 DIAGNOSIS — Z79899 Other long term (current) drug therapy: Secondary | ICD-10-CM | POA: Diagnosis not present

## 2021-12-21 DIAGNOSIS — R7303 Prediabetes: Secondary | ICD-10-CM | POA: Diagnosis not present

## 2021-12-21 DIAGNOSIS — E559 Vitamin D deficiency, unspecified: Secondary | ICD-10-CM | POA: Diagnosis not present

## 2021-12-21 DIAGNOSIS — G4700 Insomnia, unspecified: Secondary | ICD-10-CM | POA: Diagnosis not present

## 2021-12-21 DIAGNOSIS — E7800 Pure hypercholesterolemia, unspecified: Secondary | ICD-10-CM | POA: Diagnosis not present

## 2022-01-21 DIAGNOSIS — G4700 Insomnia, unspecified: Secondary | ICD-10-CM | POA: Diagnosis not present

## 2022-01-21 DIAGNOSIS — R7303 Prediabetes: Secondary | ICD-10-CM | POA: Diagnosis not present

## 2022-01-21 DIAGNOSIS — Z0000 Encounter for general adult medical examination without abnormal findings: Secondary | ICD-10-CM | POA: Diagnosis not present

## 2022-01-21 DIAGNOSIS — F411 Generalized anxiety disorder: Secondary | ICD-10-CM | POA: Diagnosis not present

## 2022-01-21 DIAGNOSIS — E7800 Pure hypercholesterolemia, unspecified: Secondary | ICD-10-CM | POA: Diagnosis not present

## 2022-01-21 DIAGNOSIS — Z79899 Other long term (current) drug therapy: Secondary | ICD-10-CM | POA: Diagnosis not present

## 2022-01-23 DIAGNOSIS — Z79899 Other long term (current) drug therapy: Secondary | ICD-10-CM | POA: Diagnosis not present

## 2022-01-30 DIAGNOSIS — Z79899 Other long term (current) drug therapy: Secondary | ICD-10-CM | POA: Diagnosis not present

## 2022-01-30 DIAGNOSIS — M25562 Pain in left knee: Secondary | ICD-10-CM | POA: Diagnosis not present

## 2022-01-30 DIAGNOSIS — M0579 Rheumatoid arthritis with rheumatoid factor of multiple sites without organ or systems involvement: Secondary | ICD-10-CM | POA: Diagnosis not present

## 2022-01-30 DIAGNOSIS — M25521 Pain in right elbow: Secondary | ICD-10-CM | POA: Diagnosis not present

## 2022-01-30 DIAGNOSIS — Z681 Body mass index (BMI) 19 or less, adult: Secondary | ICD-10-CM | POA: Diagnosis not present

## 2022-01-30 DIAGNOSIS — M25561 Pain in right knee: Secondary | ICD-10-CM | POA: Diagnosis not present

## 2022-02-18 DIAGNOSIS — Z79899 Other long term (current) drug therapy: Secondary | ICD-10-CM | POA: Diagnosis not present

## 2022-02-18 DIAGNOSIS — K5909 Other constipation: Secondary | ICD-10-CM | POA: Diagnosis not present

## 2022-02-18 DIAGNOSIS — F411 Generalized anxiety disorder: Secondary | ICD-10-CM | POA: Diagnosis not present

## 2022-02-21 DIAGNOSIS — Z79899 Other long term (current) drug therapy: Secondary | ICD-10-CM | POA: Diagnosis not present

## 2022-03-18 DIAGNOSIS — E559 Vitamin D deficiency, unspecified: Secondary | ICD-10-CM | POA: Diagnosis not present

## 2022-03-18 DIAGNOSIS — Z79899 Other long term (current) drug therapy: Secondary | ICD-10-CM | POA: Diagnosis not present

## 2022-03-18 DIAGNOSIS — G4700 Insomnia, unspecified: Secondary | ICD-10-CM | POA: Diagnosis not present

## 2022-03-18 DIAGNOSIS — F411 Generalized anxiety disorder: Secondary | ICD-10-CM | POA: Diagnosis not present

## 2022-03-21 DIAGNOSIS — Z79899 Other long term (current) drug therapy: Secondary | ICD-10-CM | POA: Diagnosis not present

## 2022-03-28 DIAGNOSIS — M0579 Rheumatoid arthritis with rheumatoid factor of multiple sites without organ or systems involvement: Secondary | ICD-10-CM | POA: Diagnosis not present

## 2022-03-28 DIAGNOSIS — Z681 Body mass index (BMI) 19 or less, adult: Secondary | ICD-10-CM | POA: Diagnosis not present

## 2022-03-28 DIAGNOSIS — Z79899 Other long term (current) drug therapy: Secondary | ICD-10-CM | POA: Diagnosis not present

## 2022-03-28 DIAGNOSIS — M25521 Pain in right elbow: Secondary | ICD-10-CM | POA: Diagnosis not present

## 2022-04-03 DIAGNOSIS — K5904 Chronic idiopathic constipation: Secondary | ICD-10-CM | POA: Diagnosis not present

## 2022-04-03 DIAGNOSIS — K861 Other chronic pancreatitis: Secondary | ICD-10-CM | POA: Diagnosis not present

## 2022-04-08 ENCOUNTER — Other Ambulatory Visit: Payer: Self-pay | Admitting: Gastroenterology

## 2022-04-08 DIAGNOSIS — K861 Other chronic pancreatitis: Secondary | ICD-10-CM

## 2022-04-25 DIAGNOSIS — S81011A Laceration without foreign body, right knee, initial encounter: Secondary | ICD-10-CM | POA: Diagnosis not present

## 2022-05-07 DIAGNOSIS — Z4802 Encounter for removal of sutures: Secondary | ICD-10-CM | POA: Diagnosis not present

## 2022-05-08 ENCOUNTER — Ambulatory Visit
Admission: RE | Admit: 2022-05-08 | Discharge: 2022-05-08 | Disposition: A | Discharge status: Home / Self Care | Payer: Medicare HMO | Source: Ambulatory Visit | Attending: Gastroenterology | Admitting: Gastroenterology

## 2022-05-08 DIAGNOSIS — K8689 Other specified diseases of pancreas: Secondary | ICD-10-CM | POA: Diagnosis not present

## 2022-05-08 DIAGNOSIS — R933 Abnormal findings on diagnostic imaging of other parts of digestive tract: Secondary | ICD-10-CM | POA: Diagnosis not present

## 2022-05-08 DIAGNOSIS — K859 Acute pancreatitis without necrosis or infection, unspecified: Secondary | ICD-10-CM | POA: Diagnosis not present

## 2022-05-08 DIAGNOSIS — N281 Cyst of kidney, acquired: Secondary | ICD-10-CM | POA: Diagnosis not present

## 2022-05-08 DIAGNOSIS — K861 Other chronic pancreatitis: Secondary | ICD-10-CM

## 2022-05-16 DIAGNOSIS — Z79899 Other long term (current) drug therapy: Secondary | ICD-10-CM | POA: Diagnosis not present

## 2022-05-16 DIAGNOSIS — M25521 Pain in right elbow: Secondary | ICD-10-CM | POA: Diagnosis not present

## 2022-05-16 DIAGNOSIS — Z681 Body mass index (BMI) 19 or less, adult: Secondary | ICD-10-CM | POA: Diagnosis not present

## 2022-05-16 DIAGNOSIS — M0579 Rheumatoid arthritis with rheumatoid factor of multiple sites without organ or systems involvement: Secondary | ICD-10-CM | POA: Diagnosis not present

## 2022-06-17 DIAGNOSIS — Z87891 Personal history of nicotine dependence: Secondary | ICD-10-CM | POA: Diagnosis not present

## 2022-06-17 DIAGNOSIS — Z79899 Other long term (current) drug therapy: Secondary | ICD-10-CM | POA: Diagnosis not present

## 2022-06-17 DIAGNOSIS — J449 Chronic obstructive pulmonary disease, unspecified: Secondary | ICD-10-CM | POA: Diagnosis not present

## 2022-06-17 DIAGNOSIS — F411 Generalized anxiety disorder: Secondary | ICD-10-CM | POA: Diagnosis not present

## 2022-06-17 DIAGNOSIS — E559 Vitamin D deficiency, unspecified: Secondary | ICD-10-CM | POA: Diagnosis not present

## 2022-06-17 DIAGNOSIS — K861 Other chronic pancreatitis: Secondary | ICD-10-CM | POA: Diagnosis not present

## 2022-06-17 DIAGNOSIS — E7800 Pure hypercholesterolemia, unspecified: Secondary | ICD-10-CM | POA: Diagnosis not present

## 2022-06-17 DIAGNOSIS — Z681 Body mass index (BMI) 19 or less, adult: Secondary | ICD-10-CM | POA: Diagnosis not present

## 2022-06-19 DIAGNOSIS — Z79899 Other long term (current) drug therapy: Secondary | ICD-10-CM | POA: Diagnosis not present

## 2022-06-27 DIAGNOSIS — Z79899 Other long term (current) drug therapy: Secondary | ICD-10-CM | POA: Diagnosis not present

## 2022-06-27 DIAGNOSIS — M25521 Pain in right elbow: Secondary | ICD-10-CM | POA: Diagnosis not present

## 2022-06-27 DIAGNOSIS — M0579 Rheumatoid arthritis with rheumatoid factor of multiple sites without organ or systems involvement: Secondary | ICD-10-CM | POA: Diagnosis not present

## 2022-06-27 DIAGNOSIS — Z681 Body mass index (BMI) 19 or less, adult: Secondary | ICD-10-CM | POA: Diagnosis not present

## 2022-07-27 ENCOUNTER — Emergency Department (HOSPITAL_BASED_OUTPATIENT_CLINIC_OR_DEPARTMENT_OTHER): Payer: Medicare HMO

## 2022-07-27 ENCOUNTER — Encounter (HOSPITAL_BASED_OUTPATIENT_CLINIC_OR_DEPARTMENT_OTHER): Payer: Self-pay | Admitting: Emergency Medicine

## 2022-07-27 ENCOUNTER — Emergency Department (HOSPITAL_BASED_OUTPATIENT_CLINIC_OR_DEPARTMENT_OTHER)
Admission: EM | Admit: 2022-07-27 | Discharge: 2022-07-27 | Disposition: A | Discharge status: Home / Self Care | Payer: Medicare HMO | Attending: Emergency Medicine | Admitting: Emergency Medicine

## 2022-07-27 ENCOUNTER — Other Ambulatory Visit: Payer: Self-pay

## 2022-07-27 DIAGNOSIS — J449 Chronic obstructive pulmonary disease, unspecified: Secondary | ICD-10-CM | POA: Insufficient documentation

## 2022-07-27 DIAGNOSIS — J209 Acute bronchitis, unspecified: Secondary | ICD-10-CM | POA: Diagnosis not present

## 2022-07-27 DIAGNOSIS — Z20822 Contact with and (suspected) exposure to covid-19: Secondary | ICD-10-CM | POA: Insufficient documentation

## 2022-07-27 DIAGNOSIS — D72829 Elevated white blood cell count, unspecified: Secondary | ICD-10-CM | POA: Insufficient documentation

## 2022-07-27 DIAGNOSIS — I700 Atherosclerosis of aorta: Secondary | ICD-10-CM | POA: Diagnosis not present

## 2022-07-27 DIAGNOSIS — R059 Cough, unspecified: Secondary | ICD-10-CM | POA: Diagnosis not present

## 2022-07-27 DIAGNOSIS — R918 Other nonspecific abnormal finding of lung field: Secondary | ICD-10-CM | POA: Diagnosis not present

## 2022-07-27 LAB — CBC
HCT: 39.2 % (ref 39.0–52.0)
Hemoglobin: 13.1 g/dL (ref 13.0–17.0)
MCH: 31.4 pg (ref 26.0–34.0)
MCHC: 33.4 g/dL (ref 30.0–36.0)
MCV: 94 fL (ref 80.0–100.0)
Platelets: 297 10*3/uL (ref 150–400)
RBC: 4.17 MIL/uL — ABNORMAL LOW (ref 4.22–5.81)
RDW: 13.5 % (ref 11.5–15.5)
WBC: 11.2 10*3/uL — ABNORMAL HIGH (ref 4.0–10.5)
nRBC: 0 % (ref 0.0–0.2)

## 2022-07-27 LAB — BASIC METABOLIC PANEL
Anion gap: 7 (ref 5–15)
BUN: 9 mg/dL (ref 6–20)
CO2: 24 mmol/L (ref 22–32)
Calcium: 8.4 mg/dL — ABNORMAL LOW (ref 8.9–10.3)
Chloride: 105 mmol/L (ref 98–111)
Creatinine, Ser: 0.72 mg/dL (ref 0.61–1.24)
GFR, Estimated: >60 mL/min (ref >60)
Glucose, Bld: 130 mg/dL — ABNORMAL HIGH (ref 70–99)
Potassium: 3.9 mmol/L (ref 3.5–5.1)
Sodium: 136 mmol/L (ref 135–145)

## 2022-07-27 LAB — RESP PANEL BY RT-PCR (FLU A&B, COVID) ARPGX2
Influenza A by PCR: NEGATIVE
Influenza B by PCR: NEGATIVE
SARS Coronavirus 2 by RT PCR: NEGATIVE

## 2022-07-27 LAB — DIFFERENTIAL
Abs Immature Granulocytes: 0.03 10*3/uL (ref 0.00–0.07)
Basophils Absolute: 0.1 10*3/uL (ref 0.0–0.1)
Basophils Relative: 1 %
Eosinophils Absolute: 0.3 10*3/uL (ref 0.0–0.5)
Eosinophils Relative: 3 %
Immature Granulocytes: 0 %
Lymphocytes Relative: 21 %
Lymphs Abs: 2.3 10*3/uL (ref 0.7–4.0)
Monocytes Absolute: 1.4 10*3/uL — ABNORMAL HIGH (ref 0.1–1.0)
Monocytes Relative: 13 %
Neutro Abs: 7 10*3/uL (ref 1.7–7.7)
Neutrophils Relative %: 62 %

## 2022-07-27 MED ORDER — SODIUM CHLORIDE 0.9 % IV BOLUS
500.0000 mL | Freq: Once | INTRAVENOUS | Status: AC
  Administered 2022-07-27: 500 mL via INTRAVENOUS

## 2022-07-27 NOTE — ED Notes (Signed)
Patient is alert time x 4 breathing nonlabored . States that he coughs up yellow phlegm

## 2022-07-27 NOTE — ED Provider Notes (Signed)
[facility] EMERGENCY DEPARTMENT Provider Note   CSN: [id number] Arrival date & time: 07/27/22  5400     History  Chief Complaint  Patient presents with   Cough    Jerry Knight is a 61 y.o. male.   Cough   61 year old male presents emergency department with complaints of productive cough.  He states the cough has been present for approximately the past week. He states that the cough is worse as the day progresses.  Associated symptoms include nasal congestion, minimally sore throat.  He was seen by the [facility] 3 to 4 days ago and prescribed benzonatate for this cough which he states has helped suppress his cough but "has made me feel loopy."   He notes oral temperatures taken at home of 99.2 but denies chills, night sweats, chest pain, shortness of breath, nausea, vomiting, headache, lightheadedness.  He has taken at home COVID test yesterday which was negative but is requesting to be tested again here in the emergency department.  Patient states that he smokes 2 packs a day and has not stopped recently.  He has had annual CT imaging of his chest to check for progression of known pulmonary nodules of which she has had 1 in the past year which showed no progression.  Past medical history significant for COPD, substance abuse, seizures, rheumatoid arthritis of which she is on etanercept, chronic pancreatitis,  Home Medications Prior to Admission medications   Medication Sig Start Date End Date Taking? Authorizing Provider  atorvastatin (LIPITOR) 20 MG tablet Take 1 tablet (20 mg total) by mouth daily. 02/19/21   Just, Laurita Quint, FNP  buprenorphine-naloxone (SUBOXONE) 8-2 mg SUBL SL tablet Place 1 tablet under the tongue daily.    [provider]  cyclobenzaprine (FLEXERIL) 10 MG tablet Take 1 tablet (10 mg total) by mouth 3 (three) times daily. 02/19/21   Just, Laurita Quint, FNP  etanercept (ENBREL) 50 MG/ML injection Inject 50 mg into the skin once a week.    [provider]  linaclotide Rolan Lipa) 145 MCG CAPS capsule Take 1 capsule (145 mcg total) by mouth daily before breakfast. 02/19/21   Just, Laurita Quint, FNP  mirtazapine (REMERON) 45 MG tablet Take 1 tablet (45 mg total) by mouth at bedtime. 02/19/21   Just, Laurita Quint, FNP  omeprazole (PRILOSEC) 20 MG capsule TAKE 1 CAPSULE(20 MG) BY MOUTH DAILY 02/19/21   Just, Laurita Quint, FNP  traZODone (DESYREL) 50 MG tablet Take 3-4 tablets (150-200 mg total) by mouth at bedtime. 02/19/21   Just, Laurita Quint, FNP      Allergies    Ambien [zolpidem tartrate], Iohexol, Seroquel [quetiapine fumarate], and Sonata [zaleplon]    Review of Systems   Review of Systems  Respiratory:  Positive for cough.     Physical Exam Updated Vital Signs BP 102/77   Pulse (!) 106   Temp 98.2 F (36.8 C) (Oral)   Resp 18   Ht 5' 5.5" (1.664 m)   Wt 53.5 kg   SpO2 94%   BMI 19.34 kg/m  Physical Exam Vitals and nursing note reviewed.  Constitutional:      General: He is not in acute distress.    Appearance: He is well-developed. He is not ill-appearing.  HENT:     Head: Normocephalic and atraumatic.     Right Ear: Tympanic membrane normal.     Left Ear: Tympanic membrane normal.     Nose: Congestion present.     Mouth/Throat:  Mouth: Mucous membranes are moist.     Pharynx: Oropharynx is clear. No oropharyngeal exudate.     Comments: Minimal posterior pharyngeal erythema.  Uvula midline and rises symmetrically with phonation.  Tonsils 1+ bilaterally with no obvious exudate.  Dentition in good repair. Eyes:     General:        Right eye: No discharge.        Left eye: No discharge.     Extraocular Movements: Extraocular movements intact.     Conjunctiva/sclera: Conjunctivae normal.     Pupils: Pupils are equal, round, and reactive to light.  Cardiovascular:     Rate and Rhythm: Regular rhythm. Tachycardia present.     Heart sounds: No murmur heard.    Comments: Patient initially tachycardic with a rate of  106. Pulmonary:     Effort: Pulmonary effort is normal. No tachypnea or respiratory distress.     Breath sounds: Normal air entry. No stridor. Decreased breath sounds present.     Comments: Diffuse decreased breath sounds.  No obvious wheeze, rhonchi or rails noticed. Abdominal:     Palpations: Abdomen is soft.     Tenderness: There is no abdominal tenderness.  Musculoskeletal:        General: No swelling.     Cervical back: Neck supple. No rigidity or tenderness.  Skin:    General: Skin is warm and dry.     Capillary Refill: Capillary refill takes less than 2 seconds.  Neurological:     Mental Status: He is alert.  Psychiatric:        Mood and Affect: Mood normal.     ED Results / Procedures / Treatments   Labs (all labs ordered are listed, but only abnormal results are displayed) Labs Reviewed  CBC - Abnormal; Notable for the following components:      Result Value   WBC 11.2 (*)    RBC 4.17 (*)    All other components within normal limits  DIFFERENTIAL - Abnormal; Notable for the following components:   Monocytes Absolute 1.4 (*)    All other components within normal limits  BASIC METABOLIC PANEL - Abnormal; Notable for the following components:   Glucose, Bld 130 (*)    Calcium 8.4 (*)    All other components within normal limits  RESP PANEL BY RT-PCR (FLU A&B, COVID) ARPGX2  CBC WITH DIFFERENTIAL/PLATELET    EKG None  Radiology DG Chest 2 View  Result Date: 07/27/2022 CLINICAL DATA:  61 year old male with history of cough and congestion for the past 5 days. EXAM: CHEST - 2 VIEW COMPARISON:  Chest x-ray 11/09/2021. FINDINGS: Lung volumes are normal. Diffuse interstitial prominence and peribronchial cuffing, similar to the prior study. No consolidative airspace disease. No pleural effusions. No pneumothorax. No pulmonary nodule or mass noted. Pulmonary vasculature and the cardiomediastinal silhouette are within normal limits. Atherosclerosis in the thoracic aorta.  Surgical clips near the gastroesophageal junction. IMPRESSION: 1. Diffuse interstitial prominence and peribronchial cuffing, similar to the prior study, favored to reflect chronic bronchitis. 2. Aortic atherosclerosis. Electronically Signed   By: Vinnie Langton M.D.   On: 07/27/2022 09:42    Procedures Procedures    Medications Ordered in ED Medications  sodium chloride 0.9 % bolus 500 mL ( Intravenous Stopped 07/27/22 1119)    ED Course/ Medical Decision Making/ A&P                           Medical Decision  Making Amount and/or Complexity of Data Reviewed Labs: ordered. Radiology: ordered.   This patient presents to the ED for concern of cough, this involves an extensive number of treatment options, and is a complaint that carries with it a high risk of complications and morbidity.  The differential diagnosis includes Differential diagnosis for emergent cause of cough includes but is not limited to upper respiratory infection, lower respiratory infection, allergies, asthma, irritants, foreign body, medications such as ACE inhibitors, reflux, asthma, CHF, lung cancer, interstitial lung disease, psychiatric causes, postnasal drip and postinfectious bronchospasm.   Co morbidities that complicate the patient evaluation  See HPI   Additional history obtained:  Additional history obtained from EMR External records from outside source obtained and reviewed including prior CT imaging from 04/19/2019 showing benign-appearing lung nodules   Lab Tests:  I Ordered, and personally interpreted labs.  The pertinent results include: Mild leukocytosis of 11.2.  No evidence of anemia.  Platelets within normal range.  No electrolyte abnormalities.  Renal function within normal limits.  Respiratory viral panel negative for COVID and influenza.   Imaging Studies ordered:  I ordered imaging studies including chest x-ray I independently visualized and interpreted imaging which showed diffuse  interstitial prominence and peribronchial cuffing suggestive of chronic bronchitis.  Aortic atherosclerosis. I agree with the radiologist interpretation  Cardiac Monitoring: / EKG:  The patient was maintained on a cardiac monitor.  I personally viewed and interpreted the cardiac monitored which showed an underlying rhythm of: Sinus rhythm   Consultations Obtained:  N/a   Problem List / ED Course / Critical interventions / Medication management  Bronchitis I ordered medication including 100 mL of normal saline because patient was initially tachycardic.  Heart rate decreased within normal range with administration of IV fluids.    Reevaluation of the patient after these medicines showed that the patient improved I have reviewed the patients home medicines and have made adjustments as needed   Social Determinants of Health:  Chronic cigarette use.  Chronic opioid use.   Test / Admission - Considered:  Bronchitis Vitals signs significant for initial tachycardia with a heart rate of 106.  Heart rate decreased within normal range with administration of fluids on the emergency department..  Patient has 94 to 95% on room air with no desaturation with ambulation..  Otherwise within normal range and stable throughout visit. Laboratory/imaging studies significant for: See above Patient symptoms most likely secondary to acute on chronic bronchitis.  Doubt pneumonia.  Doubt pulmonary malignancy given adherent follow-up through the [place] with regular CT imaging of his chest with most recent being several months ago.  Doubt pulmonary embolism.  Doubt ACS given lack of chest pain or shortness of breath.  Patient will be treated with symptomatic therapy of dextromethorphan, throat lozenges, herbal teas, continued benzonatate use at night to help sleep.  Antibiotics not warranted at this time given lack of concern for bacterial infectious process.  Treatment plan was discussed at length with patient and he  knowledge understanding was agreeable to said plan. Worrisome signs and symptoms were discussed with the patient, and the patient acknowledged understanding to return to the ED if noticed. Patient was stable upon discharge.         Final Clinical Impression(s) / ED Diagnoses Final diagnoses:  Acute bronchitis, unspecified organism    Rx / DC Orders ED Discharge Orders     None         Wilnette Kales, [place] 07/27/22 [place],  Almyra Free, MD 07/27/22 1149

## 2022-07-27 NOTE — Discharge Instructions (Addendum)
Note your work-up today was overall consistent for acute bronchitis.  This particular process takes approximately 1 to 3 weeks to resolve on average.  It typically resolves by itself.  It is most likely related to a virus and not bacterial in nature.  Given your overall reassuring work-up today, antibiotics not warranted at this time.  You can continue to use your benzonatate to help with sleep.  I recommend over-the-counter Cepacol lozenges, daily allergy medicine such as Zyrtec/Allegra.  Close follow-up with your primary care provider recommended in 2 to 3 days for reevaluation of your symptoms.  As discussed, quitting smoking will help your symptoms as well as decrease your likelihood of recurrence of the symptoms.  It would also benefit your overall health.  Please do not hesitate to return to the emergency department if the worrisome signs and symptoms we discussed become apparent.

## 2022-07-27 NOTE — ED Triage Notes (Signed)
Pt arrives pov, c/o cough and congestion x 5 days. RX for benzonatate, but not improving. Covid test negative

## 2022-08-16 ENCOUNTER — Encounter (HOSPITAL_BASED_OUTPATIENT_CLINIC_OR_DEPARTMENT_OTHER): Payer: Self-pay | Admitting: Emergency Medicine

## 2022-08-16 ENCOUNTER — Emergency Department (HOSPITAL_COMMUNITY): Payer: Medicare HMO | Admitting: Certified Registered Nurse Anesthetist

## 2022-08-16 ENCOUNTER — Emergency Department (HOSPITAL_BASED_OUTPATIENT_CLINIC_OR_DEPARTMENT_OTHER): Payer: Medicare HMO

## 2022-08-16 ENCOUNTER — Other Ambulatory Visit: Payer: Self-pay

## 2022-08-16 ENCOUNTER — Encounter (HOSPITAL_COMMUNITY)
Admission: EM | Disposition: A | Discharge status: Home Health | Payer: Self-pay | Source: Home / Self Care | Attending: Internal Medicine

## 2022-08-16 ENCOUNTER — Encounter (HOSPITAL_COMMUNITY): Payer: Self-pay

## 2022-08-16 ENCOUNTER — Inpatient Hospital Stay (HOSPITAL_BASED_OUTPATIENT_CLINIC_OR_DEPARTMENT_OTHER)
Admission: EM | Admit: 2022-08-16 | Discharge: 2022-09-11 | DRG: 853 | Disposition: A | Discharge status: Home Health | Payer: Medicare HMO | Attending: Internal Medicine | Admitting: Internal Medicine

## 2022-08-16 DIAGNOSIS — F17210 Nicotine dependence, cigarettes, uncomplicated: Secondary | ICD-10-CM | POA: Diagnosis not present

## 2022-08-16 DIAGNOSIS — K631 Perforation of intestine (nontraumatic): Secondary | ICD-10-CM | POA: Diagnosis present

## 2022-08-16 DIAGNOSIS — F1120 Opioid dependence, uncomplicated: Secondary | ICD-10-CM | POA: Diagnosis present

## 2022-08-16 DIAGNOSIS — J449 Chronic obstructive pulmonary disease, unspecified: Secondary | ICD-10-CM

## 2022-08-16 DIAGNOSIS — E876 Hypokalemia: Secondary | ICD-10-CM | POA: Diagnosis not present

## 2022-08-16 DIAGNOSIS — I3139 Other pericardial effusion (noninflammatory): Secondary | ICD-10-CM | POA: Diagnosis not present

## 2022-08-16 DIAGNOSIS — F419 Anxiety disorder, unspecified: Secondary | ICD-10-CM

## 2022-08-16 DIAGNOSIS — K650 Generalized (acute) peritonitis: Secondary | ICD-10-CM | POA: Diagnosis present

## 2022-08-16 DIAGNOSIS — R911 Solitary pulmonary nodule: Secondary | ICD-10-CM | POA: Diagnosis present

## 2022-08-16 DIAGNOSIS — Z720 Tobacco use: Secondary | ICD-10-CM | POA: Diagnosis not present

## 2022-08-16 DIAGNOSIS — Z9049 Acquired absence of other specified parts of digestive tract: Secondary | ICD-10-CM

## 2022-08-16 DIAGNOSIS — R188 Other ascites: Secondary | ICD-10-CM | POA: Diagnosis present

## 2022-08-16 DIAGNOSIS — E872 Acidosis, unspecified: Secondary | ICD-10-CM | POA: Diagnosis present

## 2022-08-16 DIAGNOSIS — R64 Cachexia: Secondary | ICD-10-CM | POA: Diagnosis not present

## 2022-08-16 DIAGNOSIS — M549 Dorsalgia, unspecified: Secondary | ICD-10-CM | POA: Diagnosis not present

## 2022-08-16 DIAGNOSIS — Z79631 Long term (current) use of antimetabolite agent: Secondary | ICD-10-CM

## 2022-08-16 DIAGNOSIS — K311 Adult hypertrophic pyloric stenosis: Secondary | ICD-10-CM

## 2022-08-16 DIAGNOSIS — R1084 Generalized abdominal pain: Secondary | ICD-10-CM | POA: Diagnosis not present

## 2022-08-16 DIAGNOSIS — K6819 Other retroperitoneal abscess: Secondary | ICD-10-CM | POA: Diagnosis not present

## 2022-08-16 DIAGNOSIS — E43 Unspecified severe protein-calorie malnutrition: Secondary | ICD-10-CM | POA: Insufficient documentation

## 2022-08-16 DIAGNOSIS — J9811 Atelectasis: Secondary | ICD-10-CM | POA: Diagnosis not present

## 2022-08-16 DIAGNOSIS — K633 Ulcer of intestine: Secondary | ICD-10-CM

## 2022-08-16 DIAGNOSIS — N200 Calculus of kidney: Secondary | ICD-10-CM | POA: Diagnosis not present

## 2022-08-16 DIAGNOSIS — T8141XA Infection following a procedure, superficial incisional surgical site, initial encounter: Secondary | ICD-10-CM | POA: Diagnosis not present

## 2022-08-16 DIAGNOSIS — Z903 Acquired absence of stomach [part of]: Secondary | ICD-10-CM

## 2022-08-16 DIAGNOSIS — K861 Other chronic pancreatitis: Secondary | ICD-10-CM | POA: Diagnosis present

## 2022-08-16 DIAGNOSIS — J410 Simple chronic bronchitis: Secondary | ICD-10-CM | POA: Diagnosis not present

## 2022-08-16 DIAGNOSIS — K5909 Other constipation: Secondary | ICD-10-CM

## 2022-08-16 DIAGNOSIS — R000 Tachycardia, unspecified: Secondary | ICD-10-CM | POA: Diagnosis not present

## 2022-08-16 DIAGNOSIS — G8929 Other chronic pain: Secondary | ICD-10-CM | POA: Diagnosis present

## 2022-08-16 DIAGNOSIS — E871 Hypo-osmolality and hyponatremia: Secondary | ICD-10-CM | POA: Diagnosis present

## 2022-08-16 DIAGNOSIS — A419 Sepsis, unspecified organism: Principal | ICD-10-CM | POA: Diagnosis present

## 2022-08-16 DIAGNOSIS — K219 Gastro-esophageal reflux disease without esophagitis: Secondary | ICD-10-CM | POA: Diagnosis present

## 2022-08-16 DIAGNOSIS — M069 Rheumatoid arthritis, unspecified: Secondary | ICD-10-CM | POA: Diagnosis not present

## 2022-08-16 DIAGNOSIS — G40909 Epilepsy, unspecified, not intractable, without status epilepticus: Secondary | ICD-10-CM | POA: Diagnosis present

## 2022-08-16 DIAGNOSIS — N1330 Unspecified hydronephrosis: Secondary | ICD-10-CM | POA: Diagnosis present

## 2022-08-16 DIAGNOSIS — D84821 Immunodeficiency due to drugs: Secondary | ICD-10-CM

## 2022-08-16 DIAGNOSIS — K5641 Fecal impaction: Secondary | ICD-10-CM | POA: Diagnosis present

## 2022-08-16 DIAGNOSIS — D638 Anemia in other chronic diseases classified elsewhere: Secondary | ICD-10-CM | POA: Diagnosis present

## 2022-08-16 DIAGNOSIS — N132 Hydronephrosis with renal and ureteral calculous obstruction: Secondary | ICD-10-CM | POA: Diagnosis not present

## 2022-08-16 DIAGNOSIS — F32A Depression, unspecified: Secondary | ICD-10-CM | POA: Diagnosis present

## 2022-08-16 DIAGNOSIS — K5900 Constipation, unspecified: Secondary | ICD-10-CM | POA: Diagnosis not present

## 2022-08-16 DIAGNOSIS — Z91041 Radiographic dye allergy status: Secondary | ICD-10-CM

## 2022-08-16 DIAGNOSIS — Y92230 Patient room in hospital as the place of occurrence of the external cause: Secondary | ICD-10-CM | POA: Diagnosis not present

## 2022-08-16 DIAGNOSIS — I10 Essential (primary) hypertension: Secondary | ICD-10-CM | POA: Diagnosis present

## 2022-08-16 DIAGNOSIS — Z79899 Other long term (current) drug therapy: Secondary | ICD-10-CM

## 2022-08-16 DIAGNOSIS — G894 Chronic pain syndrome: Secondary | ICD-10-CM | POA: Diagnosis present

## 2022-08-16 DIAGNOSIS — F5104 Psychophysiologic insomnia: Secondary | ICD-10-CM | POA: Diagnosis present

## 2022-08-16 DIAGNOSIS — Y836 Removal of other organ (partial) (total) as the cause of abnormal reaction of the patient, or of later complication, without mention of misadventure at the time of the procedure: Secondary | ICD-10-CM | POA: Diagnosis not present

## 2022-08-16 DIAGNOSIS — K6389 Other specified diseases of intestine: Secondary | ICD-10-CM | POA: Diagnosis not present

## 2022-08-16 DIAGNOSIS — E164 Increased secretion of gastrin: Secondary | ICD-10-CM | POA: Diagnosis present

## 2022-08-16 DIAGNOSIS — D649 Anemia, unspecified: Secondary | ICD-10-CM | POA: Diagnosis present

## 2022-08-16 DIAGNOSIS — M109 Gout, unspecified: Secondary | ICD-10-CM | POA: Diagnosis present

## 2022-08-16 DIAGNOSIS — R066 Hiccough: Secondary | ICD-10-CM | POA: Diagnosis not present

## 2022-08-16 DIAGNOSIS — Z681 Body mass index (BMI) 19 or less, adult: Secondary | ICD-10-CM | POA: Diagnosis not present

## 2022-08-16 DIAGNOSIS — E8809 Other disorders of plasma-protein metabolism, not elsewhere classified: Secondary | ICD-10-CM | POA: Diagnosis present

## 2022-08-16 DIAGNOSIS — F17200 Nicotine dependence, unspecified, uncomplicated: Secondary | ICD-10-CM | POA: Diagnosis not present

## 2022-08-16 DIAGNOSIS — M542 Cervicalgia: Secondary | ICD-10-CM | POA: Diagnosis not present

## 2022-08-16 DIAGNOSIS — Z8719 Personal history of other diseases of the digestive system: Secondary | ICD-10-CM

## 2022-08-16 DIAGNOSIS — E861 Hypovolemia: Secondary | ICD-10-CM | POA: Diagnosis present

## 2022-08-16 DIAGNOSIS — K660 Peritoneal adhesions (postprocedural) (postinfection): Secondary | ICD-10-CM | POA: Diagnosis present

## 2022-08-16 DIAGNOSIS — R1032 Left lower quadrant pain: Secondary | ICD-10-CM | POA: Diagnosis present

## 2022-08-16 DIAGNOSIS — Z888 Allergy status to other drugs, medicaments and biological substances status: Secondary | ICD-10-CM

## 2022-08-16 DIAGNOSIS — G43909 Migraine, unspecified, not intractable, without status migrainosus: Secondary | ICD-10-CM | POA: Diagnosis present

## 2022-08-16 DIAGNOSIS — K2100 Gastro-esophageal reflux disease with esophagitis, without bleeding: Secondary | ICD-10-CM | POA: Diagnosis not present

## 2022-08-16 DIAGNOSIS — K626 Ulcer of anus and rectum: Secondary | ICD-10-CM | POA: Diagnosis not present

## 2022-08-16 DIAGNOSIS — R109 Unspecified abdominal pain: Secondary | ICD-10-CM | POA: Diagnosis not present

## 2022-08-16 HISTORY — PX: OSTOMY: SHX5997

## 2022-08-16 HISTORY — PX: PARTIAL COLECTOMY: SHX5273

## 2022-08-16 HISTORY — PX: LAPAROTOMY: SHX154

## 2022-08-16 LAB — COMPREHENSIVE METABOLIC PANEL
ALT: 11 U/L (ref 0–44)
AST: 24 U/L (ref 15–41)
Albumin: 3.4 g/dL — ABNORMAL LOW (ref 3.5–5.0)
Alkaline Phosphatase: 94 U/L (ref 38–126)
Anion gap: 7 (ref 5–15)
BUN: 11 mg/dL (ref 6–20)
CO2: 24 mmol/L (ref 22–32)
Calcium: 8.5 mg/dL — ABNORMAL LOW (ref 8.9–10.3)
Chloride: 105 mmol/L (ref 98–111)
Creatinine, Ser: 0.82 mg/dL (ref 0.61–1.24)
GFR, Estimated: >60 mL/min (ref >60)
Glucose, Bld: 176 mg/dL — ABNORMAL HIGH (ref 70–99)
Potassium: 3.4 mmol/L — ABNORMAL LOW (ref 3.5–5.1)
Sodium: 136 mmol/L (ref 135–145)
Total Bilirubin: 0.7 mg/dL (ref 0.3–1.2)
Total Protein: 7.3 g/dL (ref 6.5–8.1)

## 2022-08-16 LAB — LIPASE, BLOOD: Lipase: 39 U/L (ref 11–51)

## 2022-08-16 LAB — CBC WITH DIFFERENTIAL/PLATELET
Abs Immature Granulocytes: 0.11 10*3/uL — ABNORMAL HIGH (ref 0.00–0.07)
Basophils Absolute: 0 10*3/uL (ref 0.0–0.1)
Basophils Relative: 0 %
Eosinophils Absolute: 0 10*3/uL (ref 0.0–0.5)
Eosinophils Relative: 0 %
HCT: 38 % — ABNORMAL LOW (ref 39.0–52.0)
Hemoglobin: 12.6 g/dL — ABNORMAL LOW (ref 13.0–17.0)
Immature Granulocytes: 1 %
Lymphocytes Relative: 5 %
Lymphs Abs: 1.1 10*3/uL (ref 0.7–4.0)
MCH: 30.7 pg (ref 26.0–34.0)
MCHC: 33.2 g/dL (ref 30.0–36.0)
MCV: 92.5 fL (ref 80.0–100.0)
Monocytes Absolute: 1.4 10*3/uL — ABNORMAL HIGH (ref 0.1–1.0)
Monocytes Relative: 6 %
Neutro Abs: 19.7 10*3/uL — ABNORMAL HIGH (ref 1.7–7.7)
Neutrophils Relative %: 88 %
Platelets: 235 10*3/uL (ref 150–400)
RBC: 4.11 MIL/uL — ABNORMAL LOW (ref 4.22–5.81)
RDW: 13.3 % (ref 11.5–15.5)
WBC: 22.4 10*3/uL — ABNORMAL HIGH (ref 4.0–10.5)
nRBC: 0 % (ref 0.0–0.2)

## 2022-08-16 LAB — URINALYSIS, MICROSCOPIC (REFLEX)

## 2022-08-16 LAB — URINALYSIS, ROUTINE W REFLEX MICROSCOPIC
Bilirubin Urine: NEGATIVE
Glucose, UA: NEGATIVE mg/dL
Ketones, ur: NEGATIVE mg/dL
Leukocytes,Ua: NEGATIVE
Nitrite: NEGATIVE
Protein, ur: NEGATIVE mg/dL
Specific Gravity, Urine: 1.015 (ref 1.005–1.030)
pH: 8.5 — ABNORMAL HIGH (ref 5.0–8.0)

## 2022-08-16 LAB — TYPE AND SCREEN
ABO/RH(D): O NEG
Antibody Screen: NEGATIVE

## 2022-08-16 LAB — LACTIC ACID, PLASMA
Lactic Acid, Venous: 3.1 mmol/L (ref 0.5–1.9)
Lactic Acid, Venous: 3.8 mmol/L (ref 0.5–1.9)
Lactic Acid, Venous: 3.1 mmol/L (ref 0.5–1.9)

## 2022-08-16 LAB — TROPONIN I (HIGH SENSITIVITY): Troponin I (High Sensitivity): 3 ng/L (ref <18)

## 2022-08-16 LAB — ABO/RH: ABO/RH(D): O NEG

## 2022-08-16 SURGERY — LAPAROTOMY, EXPLORATORY
Anesthesia: General | Site: Abdomen

## 2022-08-16 MED ORDER — LORAZEPAM 2 MG/ML IJ SOLN
0.5000 mg | INTRAMUSCULAR | Status: DC | PRN
  Administered 2022-08-18 – 2022-08-29 (×34): 0.5 mg via INTRAVENOUS
  Filled 2022-08-16 (×34): qty 1

## 2022-08-16 MED ORDER — DEXAMETHASONE SODIUM PHOSPHATE 10 MG/ML IJ SOLN
INTRAMUSCULAR | Status: DC | PRN
  Administered 2022-08-16: 10 mg via INTRAVENOUS

## 2022-08-16 MED ORDER — MORPHINE SULFATE (PF) 2 MG/ML IV SOLN: 1.0000 mg | INTRAVENOUS | Status: DC | PRN

## 2022-08-16 MED ORDER — ONDANSETRON HCL 4 MG/2ML IJ SOLN
INTRAMUSCULAR | Status: AC
  Filled 2022-08-16: qty 2

## 2022-08-16 MED ORDER — SODIUM CHLORIDE 0.9 % IV BOLUS
500.0000 mL | Freq: Once | INTRAVENOUS | Status: AC
  Administered 2022-08-16: 500 mL via INTRAVENOUS

## 2022-08-16 MED ORDER — ONDANSETRON HCL 4 MG/2ML IJ SOLN
4.0000 mg | Freq: Four times a day (QID) | INTRAMUSCULAR | Status: DC | PRN
  Administered 2022-09-05: 4 mg via INTRAVENOUS
  Filled 2022-08-16: qty 2

## 2022-08-16 MED ORDER — ONDANSETRON HCL 4 MG/2ML IJ SOLN: 4.0000 mg | Freq: Once | INTRAMUSCULAR | Status: DC | PRN

## 2022-08-16 MED ORDER — SODIUM CHLORIDE 0.9 % IV BOLUS
500.0000 mL | Freq: Once | INTRAVENOUS | Status: AC
  Administered 2022-08-17: 500 mL via INTRAVENOUS

## 2022-08-16 MED ORDER — AMISULPRIDE (ANTIEMETIC) 5 MG/2ML IV SOLN: 10.0000 mg | Freq: Once | INTRAVENOUS | Status: DC | PRN

## 2022-08-16 MED ORDER — HYDROMORPHONE HCL 1 MG/ML IJ SOLN: 0.2500 mg | INTRAMUSCULAR | Status: DC | PRN

## 2022-08-16 MED ORDER — SUCCINYLCHOLINE CHLORIDE 200 MG/10ML IV SOSY
PREFILLED_SYRINGE | INTRAVENOUS | Status: AC
  Filled 2022-08-16: qty 10

## 2022-08-16 MED ORDER — PIPERACILLIN-TAZOBACTAM 3.375 G IVPB 30 MIN
3.3750 g | INTRAVENOUS | Status: AC
  Administered 2022-08-16: 3.375 g via INTRAVENOUS
  Filled 2022-08-16 (×2): qty 50

## 2022-08-16 MED ORDER — KETAMINE HCL 50 MG/5ML IJ SOSY
16.0000 mg | PREFILLED_SYRINGE | Freq: Once | INTRAMUSCULAR | Status: AC
  Administered 2022-08-16: 16 mg via INTRAVENOUS
  Filled 2022-08-16: qty 5

## 2022-08-16 MED ORDER — 0.9 % SODIUM CHLORIDE (POUR BTL) OPTIME
TOPICAL | Status: DC | PRN
  Administered 2022-08-16 (×2): 1000 mL

## 2022-08-16 MED ORDER — DIPHENHYDRAMINE HCL 50 MG/ML IJ SOLN: 12.5000 mg | Freq: Four times a day (QID) | INTRAMUSCULAR | Status: DC | PRN

## 2022-08-16 MED ORDER — FENTANYL CITRATE (PF) 250 MCG/5ML IJ SOLN
INTRAMUSCULAR | Status: AC
  Filled 2022-08-16: qty 5

## 2022-08-16 MED ORDER — SODIUM CHLORIDE 0.9% FLUSH
3.0000 mL | Freq: Two times a day (BID) | INTRAVENOUS | Status: DC
  Administered 2022-08-16 – 2022-09-07 (×32): 3 mL via INTRAVENOUS
  Administered 2022-09-08 – 2022-09-09 (×3): 10 mL via INTRAVENOUS
  Administered 2022-09-09 – 2022-09-11 (×4): 3 mL via INTRAVENOUS

## 2022-08-16 MED ORDER — ONDANSETRON 4 MG PO TBDP: 4.0000 mg | ORAL_TABLET | Freq: Four times a day (QID) | ORAL | Status: DC | PRN

## 2022-08-16 MED ORDER — MIDAZOLAM HCL 2 MG/2ML IJ SOLN
INTRAMUSCULAR | Status: AC
  Filled 2022-08-16: qty 2

## 2022-08-16 MED ORDER — CALCIUM GLUCONATE-NACL 1-0.675 GM/50ML-% IV SOLN
1.0000 g | Freq: Once | INTRAVENOUS | Status: AC
  Administered 2022-08-16: 1000 mg via INTRAVENOUS
  Filled 2022-08-16: qty 50

## 2022-08-16 MED ORDER — FENTANYL CITRATE (PF) 250 MCG/5ML IJ SOLN
INTRAMUSCULAR | Status: DC | PRN
  Administered 2022-08-16: 25 ug via INTRAVENOUS
  Administered 2022-08-16: 100 ug via INTRAVENOUS
  Administered 2022-08-16: 50 ug via INTRAVENOUS
  Administered 2022-08-16: 25 ug via INTRAVENOUS

## 2022-08-16 MED ORDER — HYDROMORPHONE HCL 1 MG/ML IJ SOLN
INTRAMUSCULAR | Status: AC
  Administered 2022-08-16: 0.5 mg via INTRAVENOUS
  Filled 2022-08-16: qty 1

## 2022-08-16 MED ORDER — PANTOPRAZOLE SODIUM 40 MG IV SOLR
40.0000 mg | Freq: Two times a day (BID) | INTRAVENOUS | Status: DC
  Administered 2022-08-16 – 2022-08-29 (×26): 40 mg via INTRAVENOUS
  Filled 2022-08-16 (×26): qty 10

## 2022-08-16 MED ORDER — NICOTINE 21 MG/24HR TD PT24
21.0000 mg | MEDICATED_PATCH | Freq: Every day | TRANSDERMAL | Status: DC
  Administered 2022-08-16 – 2022-08-22 (×7): 21 mg via TRANSDERMAL
  Filled 2022-08-16 (×22): qty 1

## 2022-08-16 MED ORDER — LIDOCAINE 2% (20 MG/ML) 5 ML SYRINGE
INTRAMUSCULAR | Status: AC
  Filled 2022-08-16: qty 5

## 2022-08-16 MED ORDER — DIPHENHYDRAMINE HCL 12.5 MG/5ML PO ELIX: 12.5000 mg | ORAL_SOLUTION | Freq: Four times a day (QID) | ORAL | Status: DC | PRN

## 2022-08-16 MED ORDER — CHLORHEXIDINE GLUCONATE 0.12 % MT SOLN
OROMUCOSAL | Status: AC
  Administered 2022-08-16: 15 mL via OROMUCOSAL
  Filled 2022-08-16: qty 15

## 2022-08-16 MED ORDER — ACETAMINOPHEN 325 MG PO TABS: 650.0000 mg | ORAL_TABLET | Freq: Four times a day (QID) | ORAL | Status: DC | PRN

## 2022-08-16 MED ORDER — ALBUTEROL SULFATE (2.5 MG/3ML) 0.083% IN NEBU: 2.5000 mg | INHALATION_SOLUTION | Freq: Four times a day (QID) | RESPIRATORY_TRACT | Status: DC | PRN

## 2022-08-16 MED ORDER — SUGAMMADEX SODIUM 200 MG/2ML IV SOLN
INTRAVENOUS | Status: DC | PRN
  Administered 2022-08-16: 200 mg via INTRAVENOUS

## 2022-08-16 MED ORDER — LACTATED RINGERS IV SOLN: INTRAVENOUS | Status: DC | PRN

## 2022-08-16 MED ORDER — NALOXONE HCL 0.4 MG/ML IJ SOLN: 0.4000 mg | INTRAMUSCULAR | Status: DC | PRN

## 2022-08-16 MED ORDER — LIDOCAINE 2% (20 MG/ML) 5 ML SYRINGE
INTRAMUSCULAR | Status: DC | PRN
  Administered 2022-08-16: 60 mg via INTRAVENOUS

## 2022-08-16 MED ORDER — ENOXAPARIN SODIUM 40 MG/0.4ML IJ SOSY
40.0000 mg | PREFILLED_SYRINGE | INTRAMUSCULAR | Status: DC
  Administered 2022-08-17 – 2022-08-23 (×7): 40 mg via SUBCUTANEOUS
  Filled 2022-08-16 (×7): qty 0.4

## 2022-08-16 MED ORDER — PHENYLEPHRINE HCL-NACL 20-0.9 MG/250ML-% IV SOLN
INTRAVENOUS | Status: DC | PRN
  Administered 2022-08-16: 75 ug/min via INTRAVENOUS

## 2022-08-16 MED ORDER — DEXAMETHASONE SODIUM PHOSPHATE 10 MG/ML IJ SOLN
INTRAMUSCULAR | Status: AC
  Filled 2022-08-16: qty 1

## 2022-08-16 MED ORDER — ORAL CARE MOUTH RINSE: 15.0000 mL | Freq: Once | OROMUCOSAL | Status: AC

## 2022-08-16 MED ORDER — OXYCODONE HCL 5 MG PO TABS: 5.0000 mg | ORAL_TABLET | Freq: Once | ORAL | Status: DC | PRN

## 2022-08-16 MED ORDER — ONDANSETRON HCL 4 MG/2ML IJ SOLN
4.0000 mg | Freq: Once | INTRAMUSCULAR | Status: AC
  Administered 2022-08-16: 4 mg via INTRAVENOUS
  Filled 2022-08-16: qty 2

## 2022-08-16 MED ORDER — PIPERACILLIN-TAZOBACTAM 3.375 G IVPB 30 MIN
3.3750 g | Freq: Once | INTRAVENOUS | Status: AC
  Administered 2022-08-16: 3.375 g via INTRAVENOUS
  Filled 2022-08-16: qty 50

## 2022-08-16 MED ORDER — POTASSIUM CHLORIDE IN NACL 20-0.45 MEQ/L-% IV SOLN
INTRAVENOUS | Status: DC
  Filled 2022-08-16 (×6): qty 1000

## 2022-08-16 MED ORDER — HYDROMORPHONE HCL 1 MG/ML IJ SOLN
INTRAMUSCULAR | Status: DC | PRN
  Administered 2022-08-16: .5 mg via INTRAVENOUS

## 2022-08-16 MED ORDER — KETAMINE HCL 10 MG/ML IJ SOLN
0.3000 mg/kg | Freq: Once | INTRAMUSCULAR | Status: AC
  Administered 2022-08-16: 16 mg via INTRAVENOUS
  Filled 2022-08-16: qty 1

## 2022-08-16 MED ORDER — KETAMINE HCL 50 MG/5ML IJ SOSY
PREFILLED_SYRINGE | INTRAMUSCULAR | Status: AC
  Filled 2022-08-16: qty 5

## 2022-08-16 MED ORDER — MIDAZOLAM HCL 2 MG/2ML IJ SOLN
INTRAMUSCULAR | Status: DC | PRN
  Administered 2022-08-16: 2 mg via INTRAVENOUS

## 2022-08-16 MED ORDER — METHOCARBAMOL 1000 MG/10ML IJ SOLN
500.0000 mg | Freq: Three times a day (TID) | INTRAVENOUS | Status: DC | PRN
  Administered 2022-08-17: 500 mg via INTRAVENOUS
  Filled 2022-08-16: qty 500

## 2022-08-16 MED ORDER — LACTATED RINGERS IV SOLN: INTRAVENOUS | Status: DC

## 2022-08-16 MED ORDER — ONDANSETRON HCL 4 MG/2ML IJ SOLN: 4.0000 mg | Freq: Four times a day (QID) | INTRAMUSCULAR | Status: DC | PRN

## 2022-08-16 MED ORDER — ACETAMINOPHEN 10 MG/ML IV SOLN
INTRAVENOUS | Status: DC | PRN
  Administered 2022-08-16: 1000 mg via INTRAVENOUS

## 2022-08-16 MED ORDER — PROPOFOL 10 MG/ML IV BOLUS
INTRAVENOUS | Status: DC | PRN
  Administered 2022-08-16: 100 mg via INTRAVENOUS

## 2022-08-16 MED ORDER — CHLORHEXIDINE GLUCONATE 0.12 % MT SOLN: 15.0000 mL | Freq: Once | OROMUCOSAL | Status: AC

## 2022-08-16 MED ORDER — HYDROMORPHONE HCL 1 MG/ML IJ SOLN
INTRAMUSCULAR | Status: AC
  Filled 2022-08-16: qty 0.5

## 2022-08-16 MED ORDER — ROCURONIUM BROMIDE 10 MG/ML (PF) SYRINGE
PREFILLED_SYRINGE | INTRAVENOUS | Status: DC | PRN
  Administered 2022-08-16: 60 mg via INTRAVENOUS

## 2022-08-16 MED ORDER — ACETAMINOPHEN 10 MG/ML IV SOLN
INTRAVENOUS | Status: AC
  Filled 2022-08-16: qty 100

## 2022-08-16 MED ORDER — ROCURONIUM BROMIDE 10 MG/ML (PF) SYRINGE
PREFILLED_SYRINGE | INTRAVENOUS | Status: AC
  Filled 2022-08-16: qty 10

## 2022-08-16 MED ORDER — ONDANSETRON HCL 4 MG/2ML IJ SOLN
INTRAMUSCULAR | Status: DC | PRN
  Administered 2022-08-16: 4 mg via INTRAVENOUS

## 2022-08-16 MED ORDER — SODIUM CHLORIDE 0.9% FLUSH: 9.0000 mL | INTRAVENOUS | Status: DC | PRN

## 2022-08-16 MED ORDER — SUCCINYLCHOLINE CHLORIDE 200 MG/10ML IV SOSY
PREFILLED_SYRINGE | INTRAVENOUS | Status: DC | PRN
  Administered 2022-08-16: 60 mg via INTRAVENOUS

## 2022-08-16 MED ORDER — PROPOFOL 10 MG/ML IV BOLUS
INTRAVENOUS | Status: AC
  Filled 2022-08-16: qty 20

## 2022-08-16 MED ORDER — HYDROMORPHONE HCL 1 MG/ML IJ SOLN: 0.5000 mg | Freq: Once | INTRAMUSCULAR | Status: AC

## 2022-08-16 MED ORDER — MORPHINE SULFATE (PF) 4 MG/ML IV SOLN
4.0000 mg | Freq: Once | INTRAVENOUS | Status: AC
  Administered 2022-08-16: 4 mg via INTRAVENOUS
  Filled 2022-08-16: qty 1

## 2022-08-16 MED ORDER — ACETAMINOPHEN 10 MG/ML IV SOLN
1000.0000 mg | Freq: Four times a day (QID) | INTRAVENOUS | Status: AC
  Administered 2022-08-16 – 2022-08-17 (×3): 1000 mg via INTRAVENOUS
  Filled 2022-08-16 (×4): qty 100

## 2022-08-16 MED ORDER — HYDROMORPHONE 1 MG/ML IV SOLN
INTRAVENOUS | Status: DC
  Administered 2022-08-16: 1 mg via INTRAVENOUS
  Administered 2022-08-17: 3.9 mg via INTRAVENOUS
  Administered 2022-08-17: 2.1 mg via INTRAVENOUS
  Filled 2022-08-16: qty 30

## 2022-08-16 MED ORDER — OXYCODONE HCL 5 MG/5ML PO SOLN: 5.0000 mg | Freq: Once | ORAL | Status: DC | PRN

## 2022-08-16 MED ORDER — ACETAMINOPHEN 650 MG RE SUPP: 650.0000 mg | Freq: Four times a day (QID) | RECTAL | Status: DC | PRN

## 2022-08-16 MED ORDER — KETAMINE HCL 10 MG/ML IJ SOLN
INTRAMUSCULAR | Status: DC | PRN
  Administered 2022-08-16: 20 mg via INTRAVENOUS
  Administered 2022-08-16: 30 mg via INTRAVENOUS

## 2022-08-16 MED ORDER — ALBUMIN HUMAN 5 % IV SOLN: INTRAVENOUS | Status: DC | PRN

## 2022-08-16 SURGICAL SUPPLY — 46 items
APL PRP STRL LF DISP 70% ISPRP (MISCELLANEOUS) ×2
BIOPATCH RED 1 DISK 7.0 (GAUZE/BANDAGES/DRESSINGS) IMPLANT
BLADE CLIPPER SURG (BLADE) IMPLANT
BNDG GAUZE DERMACEA FLUFF 4 (GAUZE/BANDAGES/DRESSINGS) IMPLANT
BNDG GZE DERMACEA 4 6PLY (GAUZE/BANDAGES/DRESSINGS) ×2
CANISTER SUCT 3000ML PPV (MISCELLANEOUS) ×2 IMPLANT
CHLORAPREP W/TINT 26 (MISCELLANEOUS) ×2 IMPLANT
COVER SURGICAL LIGHT HANDLE (MISCELLANEOUS) ×2 IMPLANT
DRAIN CHANNEL 19F RND (DRAIN) IMPLANT
DRAPE LAPAROSCOPIC ABDOMINAL (DRAPES) ×2 IMPLANT
DRAPE WARM FLUID 44X44 (DRAPES) ×2 IMPLANT
DRSG TEGADERM 4X4.75 (GAUZE/BANDAGES/DRESSINGS) IMPLANT
ELECT BLADE 6.5 EXT (BLADE) IMPLANT
ELECT CAUTERY BLADE 6.4 (BLADE) ×2 IMPLANT
ELECT REM PT RETURN 9FT ADLT (ELECTROSURGICAL) ×2
ELECTRODE REM PT RTRN 9FT ADLT (ELECTROSURGICAL) ×2 IMPLANT
EVACUATOR SILICONE 100CC (DRAIN) IMPLANT
GAUZE PAD ABD 8X10 STRL (GAUZE/BANDAGES/DRESSINGS) IMPLANT
GLOVE BIO SURGEON STRL SZ7.5 (GLOVE) ×2 IMPLANT
GLOVE BIOGEL PI IND STRL 8 (GLOVE) ×2 IMPLANT
GOWN STRL REUS W/ TWL LRG LVL3 (GOWN DISPOSABLE) ×2 IMPLANT
GOWN STRL REUS W/ TWL XL LVL3 (GOWN DISPOSABLE) ×2 IMPLANT
GOWN STRL REUS W/TWL LRG LVL3 (GOWN DISPOSABLE) ×2
GOWN STRL REUS W/TWL XL LVL3 (GOWN DISPOSABLE) ×2
HANDLE SUCTION POOLE (INSTRUMENTS) ×2 IMPLANT
KIT BASIN OR (CUSTOM PROCEDURE TRAY) ×2 IMPLANT
KIT OSTOMY DRAINABLE 2.75 STR (WOUND CARE) IMPLANT
KIT TURNOVER KIT B (KITS) ×2 IMPLANT
LIGASURE IMPACT 36 18CM CVD LR (INSTRUMENTS) IMPLANT
NS IRRIG 1000ML POUR BTL (IV SOLUTION) ×4 IMPLANT
PACK GENERAL/GYN (CUSTOM PROCEDURE TRAY) ×2 IMPLANT
PAD ARMBOARD 7.5X6 YLW CONV (MISCELLANEOUS) ×2 IMPLANT
SPECIMEN JAR LARGE (MISCELLANEOUS) IMPLANT
STAPLER CVD CUT BL 40 RELOAD (ENDOMECHANICALS) ×2 IMPLANT
STAPLER CVD CUT BLU 40 RELOAD (ENDOMECHANICALS) IMPLANT
STAPLER PROXIMATE 75MM BLUE (STAPLE) IMPLANT
STAPLER VISISTAT 35W (STAPLE) ×2 IMPLANT
SUCTION POOLE HANDLE (INSTRUMENTS) ×2
SUT ETHILON 2 0 FS 18 (SUTURE) IMPLANT
SUT PDS AB 1 TP1 54 (SUTURE) IMPLANT
SUT PROLENE 0 CT 1 30 (SUTURE) IMPLANT
SUT SILK 2 0 SH CR/8 (SUTURE) ×2 IMPLANT
SUT SILK 2 0 TIES 10X30 (SUTURE) ×2 IMPLANT
SUT SILK 3 0 SH CR/8 (SUTURE) ×2 IMPLANT
SUT SILK 3 0 TIES 10X30 (SUTURE) ×2 IMPLANT
TOWEL GREEN STERILE (TOWEL DISPOSABLE) ×2 IMPLANT

## 2022-08-16 NOTE — ED Triage Notes (Signed)
Pt c/o constipation x 1 week and LLQ abd pain.

## 2022-08-16 NOTE — ED Provider Notes (Signed)
Patient transferred from ED at [name], care assumed from Dr. Ayesha Rumpf and Dr. Sherry Ruffing, please see their notes for full details, but in brief Jerry Knight is a 61 y.o. male transferred for immediate surgical evaluation for colon perforation and bowel obstruction.  Dr. Rosendo Gros with general surgery was consulted prior to transfer.  Surgical team had initially requested medicine admission, Dr. Tamala Julian with hospitalist team request that surgery evaluate patient prior to medical admission.   Upon arrival patient appears very uncomfortable, ill-appearing, remains tachycardic with heart rate in the 110s but normotensive.  Has been receiving ketamine for pain control as patient is on Suboxone.  No current vomiting but diffuse abdominal tenderness and guarding.  Additional dose of ketamine ordered.  I paged surgical team to let them know that the patient has arrived in the emergency department, discussed with PA Saverio Danker, they will be down to see patient shortly.  Dr. Tamala Julian with hospitalist is aware that patient has arrived, would like to wait until surgery has evaluated the patient prior to potential medicine admission.  Dr. Rosendo Gros is at bedside, plans to take patient to the OR shortly, given patient's ill appearance as well as underlying chronic COPD may potentially require ICU care and may have to remain on ventilator after surgery.  Surgery team will consult critical care versus medicine postoperatively      Janet [place] 08/16/22 1033    Malvin Johns, MD 08/16/22 1459

## 2022-08-16 NOTE — Progress Notes (Signed)
Patient received from PACU, somnolence, VSS with continuous yellow MEWS score  and lactic acide 3.1 since this AM, repeat LA ordered with no result until this moment, report will be give to the night nurse and continue to monitor.

## 2022-08-16 NOTE — Transfer of Care (Signed)
Immediate Anesthesia Transfer of Care Note  Patient: Jerry Knight  Procedure(s) Performed: EXPLORATORY LAPAROTOMY (Abdomen) COLOSTOMY (Left: Abdomen) PARTIAL SIGMOID COLECTOMY (Abdomen)  Patient Location: PACU  Anesthesia Type:General  Level of Consciousness: sedated  Airway & Oxygen Therapy: Patient Spontanous Breathing and Patient connected to face mask oxygen  Post-op Assessment: Report given to RN and Post -op Vital signs reviewed and stable  Post vital signs: Reviewed and stable  Last Vitals:  Vitals Value Taken Time  BP 107/67 08/16/22 1545  Temp 36.8 C 08/16/22 1545  Pulse 101 08/16/22 1553  Resp 19 08/16/22 1553  SpO2 98 % 08/16/22 1553  Vitals shown include unvalidated device data.  Last Pain:  Vitals:   08/16/22 1545  TempSrc:   PainSc: Asleep         Complications: No notable events documented.

## 2022-08-16 NOTE — ED Notes (Addendum)
Per Dr. Rosendo Gros hold NG tube at this time; pt should be going to the OR pretty soon; will continue to monitor

## 2022-08-16 NOTE — Op Note (Addendum)
08/16/2022  3:26 PM  PATIENT:  Jerry Knight  61 y.o. male  PRE-OPERATIVE DIAGNOSIS:  stercoral ulcer  POST-OPERATIVE DIAGNOSIS:  stercoral ulcer  PROCEDURE:  Procedure(s): EXPLORATORY LAPAROTOMY (N/A) COLOSTOMY (Left) PARTIAL SIGMOID COLECTOMY (N/A)  SURGEON:  Surgeon(s) and Role:    Ralene Ok, MD - Primary  ASSISTANTS: Pryor Curia, RNFA  ANESTHESIA:   general  EBL:  100 mL   BLOOD ADMINISTERED:none  DRAINS: (19 [place] Blake drain) Jackson-Pratt drain(s) with closed bulb suction in the pelvis  LOCAL MEDICATIONS USED:  NONE  SPECIMEN:  Source of Specimen: Sigmoid colon with perforation  DISPOSITION OF SPECIMEN:  PATHOLOGY  COUNTS:  YES  TOURNIQUET:  * No tourniquets in log *  DICTATION: .Dragon Dictation Indication procedure: Patient is a 61 year old male, who came in with an acute abdomen, patient underwent work-up per EDP.  CT scan was found to have perforated viscus, likely stercoral ulcer.  Patient with a history of Suboxone use and constipation.  Patient was taken back to the operating room urgently for exploratory laparotomy.  Findings: Patient had a large stercoral ulcer into the mesentery.  There was also free perforation with feculent peritonitis.  Several calcified stool balls were palpated in the transverse colon.  The sigmoid colon was resected which contained perforation.  This was taken down to the sacral promontory.  The staple line was tagged using 0 Prolene's x2.  Details of procedure: After the patient was consented he was taken back to the OR and placed supine position bilateral SCDs in place.  He underwent general trach anesthesia.  He was then prepped draped sterile fashion.  A timeout is called and all facts verified.  A midline incision was made using #10 blade.  Dissection taken down to the linea alba with cautery to maintain hemostasis.  The fascia was elevated.  The peritoneum was entered bluntly.  There was a large amount of  omental adhesions to the midline.  This was taken down carefully.  The fascia was then extended to the length of skin incision.  It was noted that there is large amount of feculent ascites within the abdomen.  This was suctioned out down towards the pelvis.  At this time I was able to retract the sigmoid colon medially.  It was evident that there was a free perforation of the stercoral ulcer into the peritoneum.  At this time a Bookwalter retractor was placed to help with abdominal retraction.  At this time the white line of Toldt was incised laterally.  An area of the left colon was chosen for transection.  I was able to medialize the colon medially.  At this time a mesenteric window was made.  A 75 GIA stapler was used to transect the left colon.  At this time a LigaSure device was used to ligate the mesentery.  This was taken down to the root of the sigmoid colon.  The left ureter was identified and protected all points of the case.  The dissection was taken down to the sacral promontory.  At this time a blue contour was used to transect the rectum.  The specimen was handed off for pathology.  At this time the pelvis and the left lower quadrant were then irrigated out with sterile saline.  The staple line was then tagged using a 0 Prolene.  A 19 [place] Blake drain was brought out through the right lower quadrant via stab incision.  This was placed into the pelvis and the left lower quadrant area.  This was secured using 3-0 nylon to the skin.  At this time an ostomy site was chosen.  The skin and subcutaneous tissue was removed.  3 finger breaths were then able to pass through the rectus fascia.  The descending colon was brought up through the ostomy.  This was matured using 3-0 Vicryl's in interrupted fashion.  Ostomy appliance was placed.  The midline incision was packed with saline soaked Kerlix.  This was dressed with ABD pad and tape.  Patient tolerated procedure well was taken to the recovery in  stable condition.  CASE DATA:  Type of patient?: DOW CASE (Surgical Hospitalist [facility])  Status of Case? EMERGENT Add On  Infection Present At Time Of Surgery (PATOS)?  FECULENT PERITONITIS       PLAN OF CARE: Admit to inpatient   PATIENT DISPOSITION:  PACU - hemodynamically stable.   Delay start of Pharmacological VTE agent (>24hrs) due to surgical blood loss or risk of bleeding: yes

## 2022-08-16 NOTE — Anesthesia Preprocedure Evaluation (Signed)
Anesthesia Evaluation  Patient identified by MRN, date of birth, ID band Patient awake    Reviewed: Allergy & Precautions, NPO status , Patient's Chart, lab work & pertinent test results  History of Anesthesia Complications Negative for: history of anesthetic complications  Airway Mallampati: II  TM Distance: >3 FB Neck ROM: Full    Dental  (+) Dental Advisory Given   Pulmonary COPD, Current Smoker and Patient abstained from smoking.,    Pulmonary exam normal        Cardiovascular negative cardio ROS Normal cardiovascular exam     Neuro/Psych negative neurological ROS     GI/Hepatic Neg liver ROS, PUD, GERD  ,Likely stercoral perforation   Endo/Other  negative endocrine ROS  Renal/GU negative Renal ROS  negative genitourinary   Musculoskeletal  (+) narcotic dependent  Abdominal   Peds  Hematology  (+) Blood dyscrasia, anemia ,   Anesthesia Other Findings Chronic pain on suboxone  Reproductive/Obstetrics                            Anesthesia Physical Anesthesia Plan  ASA: 4 and emergent  Anesthesia Plan: General   Post-op Pain Management: Ofirmev IV (intra-op)*, Ketamine IV* and Dilaudid IV   Induction: Intravenous  PONV Risk Score and Plan: 2 and Ondansetron, Dexamethasone, Treatment may vary due to age or medical condition and Midazolam  Airway Management Planned: Oral ETT  Additional Equipment:   Intra-op Plan:   Post-operative Plan: Extubation in OR and Possible Post-op intubation/ventilation  Informed Consent: I have reviewed the patients History and Physical, chart, labs and discussed the procedure including the risks, benefits and alternatives for the proposed anesthesia with the patient or authorized representative who has indicated his/her understanding and acceptance.     Dental advisory given  Plan Discussed with:   Anesthesia Plan Comments:         Anesthesia Quick Evaluation

## 2022-08-16 NOTE — ED Notes (Signed)
Called report to charge RN at [employer] .

## 2022-08-16 NOTE — ED Provider Notes (Signed)
[facility] Provider Note   CSN: [id number] Arrival date & time: 08/16/22  0455     History  Chief Complaint  Patient presents with   Abdominal Pain    Jerry Knight is a 61 y.o. male.  The history is provided by the patient, the EMS personnel and medical records.  Abdominal Pain Jerry Knight is a 61 y.o. male who presents to the Emergency Department complaining of abdominal pain.  He presents to the emergency department for evaluation of lower abdominal pain.  Pain started about a week ago and is located in the left lower quadrant and lower abdomen.  It is constant in nature and worse with movement.  He has associated constipation, had a small BM today but reports prior to today it has been 4 days since last BM.  No associated fever but does feel somewhat short of breath today.  No significant cough or chest pain.  Has nausea but no vomiting.  EMS reports 200 mcg of fentanyl given prior to ED arrival.  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       Home Medications Prior to Admission medications   Medication Sig Start Date End Date Taking? Authorizing Provider  atorvastatin (LIPITOR) 20 MG tablet Take 1 tablet (20 mg total) by mouth daily. 02/19/21   Just, Laurita Quint, FNP  buprenorphine-naloxone (SUBOXONE) 8-2 mg SUBL SL tablet Place 1 tablet under the tongue daily.    [provider]  cyclobenzaprine (FLEXERIL) 10 MG tablet Take 1 tablet (10 mg total) by mouth 3 (three) times daily. 02/19/21   Just, Laurita Quint, FNP  etanercept (ENBREL) 50 MG/ML injection Inject 50 mg into the skin once a week.    [provider]  linaclotide Rolan Lipa) 145 MCG CAPS capsule Take 1 capsule (145 mcg total) by mouth daily before breakfast. 02/19/21   Just, Laurita Quint, FNP  mirtazapine (REMERON) 45 MG tablet Take 1 tablet (45 mg total) by mouth at bedtime. 02/19/21   Just, Laurita Quint, FNP  omeprazole  (PRILOSEC) 20 MG capsule TAKE 1 CAPSULE(20 MG) BY MOUTH DAILY 02/19/21   Just, Laurita Quint, FNP  traZODone (DESYREL) 50 MG tablet Take 3-4 tablets (150-200 mg total) by mouth at bedtime. 02/19/21   Just, Laurita Quint, FNP      Allergies    Ambien [zolpidem tartrate], Iohexol, Seroquel [quetiapine fumarate], and Sonata [zaleplon]    Review of Systems   Review of Systems  Gastrointestinal:  Positive for abdominal pain.  All other systems reviewed and are negative.   Physical Exam Updated Vital Signs BP (!) 143/86   Pulse (!) 106   Temp 99 F (37.2 C) (Oral)   Resp (!) 22   Ht 5' 5.5" (1.664 m)   Wt 52.6 kg   SpO2 96%   BMI 19.01 kg/m  Physical Exam Vitals and nursing note reviewed.  Constitutional:      Appearance: He is well-developed.  HENT:     Head: Normocephalic and atraumatic.  Cardiovascular:     Rate and Rhythm: Regular rhythm. Tachycardia present.     Heart sounds: No murmur heard. Pulmonary:     Effort: Pulmonary effort is normal. No respiratory distress.  Abdominal:     Palpations: Abdomen is soft.     Tenderness: There is abdominal tenderness. There is no guarding or rebound.     Comments: Moderate left lower quadrant and lower abdominal tenderness with voluntary guarding  Genitourinary:    Comments: Gross blood on DRE.  There is fullness to the prostate without prostatic tenderness.  No stool in rectal vault. Musculoskeletal:        General: No swelling or tenderness.  Skin:    General: Skin is warm and dry.  Neurological:     Mental Status: He is alert and oriented to person, place, and time.  Psychiatric:        Behavior: Behavior normal.     ED Results / Procedures / Treatments   Labs (all labs ordered are listed, but only abnormal results are displayed) Labs Reviewed  COMPREHENSIVE METABOLIC PANEL - Abnormal; Notable for the following components:      Result Value   Potassium 3.4 (*)    Glucose, Bld 176 (*)    Calcium 8.5 (*)    Albumin 3.4 (*)     All other components within normal limits  CBC WITH DIFFERENTIAL/PLATELET - Abnormal; Notable for the following components:   WBC 22.4 (*)    RBC 4.11 (*)    Hemoglobin 12.6 (*)    HCT 38.0 (*)    Neutro Abs 19.7 (*)    Monocytes Absolute 1.4 (*)    Abs Immature Granulocytes 0.11 (*)    All other components within normal limits  LACTIC ACID, PLASMA - Abnormal; Notable for the following components:   Lactic Acid, Venous 3.1 (*)    All other components within normal limits  CULTURE, BLOOD (ROUTINE X 2)  CULTURE, BLOOD (ROUTINE X 2)  LIPASE, BLOOD  LACTIC ACID, PLASMA  URINALYSIS, ROUTINE W REFLEX MICROSCOPIC  TROPONIN I (HIGH SENSITIVITY)    EKG EKG Interpretation  Date/Time:  Friday August 16 2022 05:18:50 EDT Ventricular Rate:  108 PR Interval:  165 QRS Duration: 105 QT Interval:  341 QTC Calculation: 457 R Axis:   11 Text Interpretation: Sinus tachycardia Borderline low voltage, extremity leads RSR' in V1 or V2, right VCD or RVH Nonspecific T abnrm, anterolateral leads Confirmed by Quintella Reichert [phone]) on 08/16/2022 5:21:27 AM  Radiology DG Chest Port 1 View  Result Date: 08/16/2022 CLINICAL DATA:  Shortness of breath. EXAM: PORTABLE CHEST 1 VIEW COMPARISON:  07/27/2022 FINDINGS: 0523 hours. The lungs are clear without focal pneumonia, edema, pneumothorax or pleural effusion. Interstitial markings are diffusely coarsened with chronic features. Streaky opacity at the lung bases suggest atelectasis. Bones are demineralized. Telemetry leads overlie the chest. IMPRESSION: Chronic interstitial changes with bibasilar atelectasis. Electronically Signed   By: Misty Stanley M.D.   On: 08/16/2022 05:45    Procedures Procedures   CRITICAL CARE Performed by: Quintella Reichert   Total critical care time: 40 minutes  Critical care time was exclusive of separately billable procedures and treating other patients.  Critical care was necessary to treat or prevent imminent or  life-threatening deterioration.  Critical care was time spent personally by me on the following activities: development of treatment plan with patient and/or surrogate as well as nursing, discussions with consultants, evaluation of patient's response to treatment, examination of patient, obtaining history from patient or surrogate, ordering and performing treatments and interventions, ordering and review of laboratory studies, ordering and review of radiographic studies, pulse oximetry and re-evaluation of patient's condition.  Medications Ordered in ED Medications  piperacillin-tazobactam (ZOSYN) IVPB 3.375 g (3.375 g Intravenous New Bag/Given 08/16/22 0629)  ketamine (KETALAR) injection 16 mg (has no administration in time range)  ketamine (KETALAR) injection 16 mg (16 mg Intravenous Given 08/16/22 0534)  ondansetron (ZOFRAN) injection 4 mg (  4 mg Intravenous Given 08/16/22 0534)  sodium chloride 0.9 % bolus 500 mL (0 mLs Intravenous Stopped 08/16/22 0630)  morphine (PF) 4 MG/ML injection 4 mg (4 mg Intravenous Given 08/16/22 0628)  sodium chloride 0.9 % bolus 500 mL (500 mLs Intravenous New Bag/Given 08/16/22 6333)    ED Course/ Medical Decision Making/ A&P                           Medical Decision Making Amount and/or Complexity of Data Reviewed Labs: ordered. Radiology: ordered.  Risk Prescription drug management.   Patient here for evaluation of 1 to 2 weeks of lower abdominal pain and several days of constipation.  Patient in distress on ED evaluation with significant lower abdominal tenderness.  He has been treated with multiple rounds of fentanyl with no improvement in his pain prior to ED arrival so he was treated with ketamine with transient improvement in his pain.  He did have recurrent pain and was treated with morphine with no improvement and he requested ketamine again for pain control.  CBC with leukocytosis, lactic acid is elevated.  He was treated with IV fluids, antibiotics  for intra-abdominal infection.  Nursing attempted to place Foley catheter for urinary retention, unable to have return of urine.  Procedure was discontinued.  Patient care transferred pending CT scan results.       Final Clinical Impression(s) / ED Diagnoses Final diagnoses:  None    Rx / DC Orders ED Discharge Orders     None         Quintella Reichert, MD 08/16/22 [id number]

## 2022-08-16 NOTE — Consult Note (Signed)
[facility] Nurse ostomy consult note Consult received for new descending colostomy (Dr. Rosendo Gros).  [facility] will see on Monday, 08/19/22.  [facility] nursing team will follow, and will remain available to this patient, the nursing and medical teams.  Thank you for inviting [place] to participate in this patient's Plan of Care.  Maudie Flakes, MSN, RN, CNS, [place], Serita Grammes, [employer], [employer] phone:  [phone]

## 2022-08-16 NOTE — ED Notes (Signed)
Attempted to place NG via Rt nare, pt began vomiting large amt of food like particle. ED MD aware, will hold insertion attempts at this time

## 2022-08-16 NOTE — Consult Note (Addendum)
Jerry Knight 02-Jul-1961  [id number].    Requesting MD: Dr. Malvin Johns Chief Complaint/Reason for Consult: possible stercoral perforation  HPI:  This is a 61 yo male who appears older than his stated age with a history of chronic pain syndrome on suboxone, COPD, chronic pancreatitis with prior surgery for pseudocyst removal in the 1990s, prior BII in the 1980s, seizure disorder (no seizure in 15 years, doesn't take meds), and Zollinger-Ellison syndrome who has been having issues with increase in reflux symptoms over the last week.  He has intermittent issues with constipation and diarrhea at times.  He takes Linzess for constipation.  Yesterday he began to have an acute onset of abdominal pain along with N/V.  His last BM was this morning but it was tiny and he hasn't had a good BM in a long time.  Up until yesterday, he states he was eating well.  He last vomited this morning.  Due to persistent worsening pain in his LLQ he presented to [facility] ED for evaluation.  He was found to have a WBC of 22K, lactic acid of 3.1 and a CT scan concerning for a stercoral perforation.  We have been asked to see him for further evaluation.  ROS: ROS: see HPI  Family History  Problem Relation Age of Onset   Angina Mother    Nephrolithiasis Father    Cancer Brother     Past Medical History:  Diagnosis Date   Blood transfusion without reported diagnosis    Chest pain    Chronic neck pain    Chronic pain    Chronic pancreatitis (HCC)    COPD (chronic obstructive pulmonary disease) (HCC)    Degenerative disk disease    C 5 &C 6   Depression    GI bleed    Gout    Migraine    Migraine headache    Pain management contract agreement    Seizure disorder ([place])    x 7 years   Seizures ([place])    Substance abuse ([place])    Has been cleaned for 18 mos   Zollinger-Ellison syndrome     Past Surgical History:  Procedure Laterality Date   APPENDECTOMY     per CT 2012- s/p appendectomy    CHOLECYSTECTOMY     EYE SURGERY     HERNIA REPAIR     PARTIAL GASTRECTOMY     SMALL INTESTINE SURGERY      Social History:  reports that he has been smoking cigarettes. He has been smoking an average of 2 packs per day. He has never used smokeless tobacco. He reports current drug use. Drugs: Marijuana and Other-see comments. He reports that he does not drink alcohol.  Allergies:  Allergies  Allergen Reactions   Ambien [Zolpidem Tartrate] Other (See Comments)    Black out   Iohexol     Pt claims he has chest tightness and sob after iv contrast injections.   Lunesta [Eszopiclone] Other (See Comments)   Seroquel [Quetiapine Fumarate]     Blackout   Sonata [Zaleplon]     "black out'  confused    Medications Prior to Admission  Medication Sig Dispense Refill   amitriptyline (ELAVIL) 50 MG tablet Take 100 mg by mouth at bedtime.     atorvastatin (LIPITOR) 20 MG tablet Take 1 tablet (20 mg total) by mouth daily. 90 tablet 3   buprenorphine-naloxone (SUBOXONE) 8-2 mg SUBL SL tablet Place 1 tablet under the tongue daily.  cyclobenzaprine (FLEXERIL) 10 MG tablet Take 1 tablet (10 mg total) by mouth 3 (three) times daily. 30 tablet 5   etanercept (ENBREL) 50 MG/ML injection Inject 50 mg into the skin once a week.     folic acid (FOLVITE) 1 MG tablet Take 1 mg by mouth daily.     linaclotide (LINZESS) 145 MCG CAPS capsule Take 1 capsule (145 mcg total) by mouth daily before breakfast. 30 capsule 5   LORazepam (ATIVAN) 1 MG tablet Take 1 mg by mouth 3 (three) times daily.     methotrexate (RHEUMATREX) 2.5 MG tablet Take 10 mg by mouth once a week.     mirtazapine (REMERON) 45 MG tablet Take 1 tablet (45 mg total) by mouth at bedtime. 90 tablet 1   omeprazole (PRILOSEC) 20 MG capsule TAKE 1 CAPSULE(20 MG) BY MOUTH DAILY 90 capsule 3   traZODone (DESYREL) 50 MG tablet Take 3-4 tablets (150-200 mg total) by mouth at bedtime. 120 tablet 5     Physical Exam: Blood pressure 136/79, pulse (!)  112, temperature 98.9 F (37.2 C), resp. rate 20, height 5' 5.5" (1.664 m), weight 52.6 kg, SpO2 95 %. General: pleasant, malnourished appearing white male who is laying in bed in mild distress secondary to pain. HEENT: head is normocephalic, atraumatic.  Sclera are noninjected.  PERRL.  Ears and nose without any masses or lesions.  Mouth is pink and moist Heart: regular rhythm, but tachy.  Normal s1,s2. No obvious murmurs, gallops, or rubs noted.  Palpable radial and pedal pulses bilaterally Lungs: CTAB, no wheezes, rhonchi, or rales noted.  Respiratory effort nonlabored but somewhat tachypneic secondary to pain.  Abd: soft, diffusely tender but greatest in LLQ with some guarding and peritonitis, ND, absent BS, no masses, hernias, or organomegaly.  Midline scar noted from previous laparotomies MS: all 4 extremities are symmetrical with no cyanosis, clubbing, or edema.  He has muscle atrophy with wasting around his temples and in some of his extremities.   Skin: warm and dry with no masses, lesions, or rashes Neuro: Cranial nerves 2-12 grossly intact, sensation is normal throughout Psych: A&Ox3 with an appropriate affect.   Results for orders placed or performed during the hospital encounter of 08/16/22 (from the past 48 hour(s))  Comprehensive metabolic panel     Status: Abnormal   Collection Time: 08/16/22  5:06 AM  Result Value Ref Range   Sodium 136 135 - 145 mmol/L   Potassium 3.4 (L) 3.5 - 5.1 mmol/L   Chloride 105 98 - 111 mmol/L   CO2 24 22 - 32 mmol/L   Glucose, Bld 176 (H) 70 - 99 mg/dL    Comment: Glucose reference range applies only to samples taken after fasting for at least 8 hours.   BUN 11 6 - 20 mg/dL   Creatinine, Ser 0.82 0.61 - 1.24 mg/dL   Calcium 8.5 (L) 8.9 - 10.3 mg/dL   Total Protein 7.3 6.5 - 8.1 g/dL   Albumin 3.4 (L) 3.5 - 5.0 g/dL   AST 24 15 - 41 U/L   ALT 11 0 - 44 U/L   Alkaline Phosphatase 94 38 - 126 U/L   Total Bilirubin 0.7 0.3 - 1.2 mg/dL   GFR,  Estimated >60 >60 mL/min    Comment: (NOTE) Calculated using the CKD-EPI Creatinine Equation (2021)    Anion gap 7 5 - 15    Comment: Performed at [facility], [address] [place], [place] [address]  CBC with  Differential     Status: Abnormal   Collection Time: 08/16/22  5:06 AM  Result Value Ref Range   WBC 22.4 (H) 4.0 - 10.5 K/uL    Comment: REPEATED TO VERIFY   RBC 4.11 (L) 4.22 - 5.81 MIL/uL   Hemoglobin 12.6 (L) 13.0 - 17.0 g/dL   HCT 38.0 (L) 39.0 - 52.0 %   MCV 92.5 80.0 - 100.0 fL   MCH 30.7 26.0 - 34.0 pg   MCHC 33.2 30.0 - 36.0 g/dL   RDW 13.3 11.5 - 15.5 %   Platelets 235 150 - 400 K/uL   nRBC 0.0 0.0 - 0.2 %   Neutrophils Relative % 88 %   Neutro Abs 19.7 (H) 1.7 - 7.7 K/uL   Lymphocytes Relative 5 %   Lymphs Abs 1.1 0.7 - 4.0 K/uL   Monocytes Relative 6 %   Monocytes Absolute 1.4 (H) 0.1 - 1.0 K/uL   Eosinophils Relative 0 %   Eosinophils Absolute 0.0 0.0 - 0.5 K/uL   Basophils Relative 0 %   Basophils Absolute 0.0 0.0 - 0.1 K/uL   Immature Granulocytes 1 %   Abs Immature Granulocytes 0.11 (H) 0.00 - 0.07 K/uL    Comment: Performed at [facility], [address] [place], [place] [address]  Lactic acid, plasma     Status: Abnormal   Collection Time: 08/16/22  5:06 AM  Result Value Ref Range   Lactic Acid, Venous 3.1 (HH) 0.5 - 1.9 mmol/L    Comment: CRITICAL RESULT CALLED TO, READ BACK BY AND VERIFIED WITH Marsa Aris, RN ON 08/16/22 AT 0556 BY JAG Performed at [facility], [address] [place], [place] [address]   Lipase, blood     Status: None   Collection Time: 08/16/22  5:06 AM  Result Value Ref Range   Lipase 39 11 - 51 U/L    Comment: Performed at [facility], [address] [place], [place] [address]  Troponin I (High Sensitivity)     Status: None   Collection Time: 08/16/22  5:22 AM  Result Value Ref Range   Troponin I (High Sensitivity) 3 <18 ng/L    Comment: (NOTE) Elevated high  sensitivity troponin I (hsTnI) values and significant  changes across serial measurements may suggest ACS but many other  chronic and acute conditions are known to elevate hsTnI results.  Refer to the "Links" section for chest pain algorithms and additional  guidance. Performed at [facility], [address] [place], [place] [address]   Urinalysis, Routine w reflex microscopic Urine, Clean Catch     Status: Abnormal   Collection Time: 08/16/22  7:35 AM  Result Value Ref Range   Color, Urine YELLOW YELLOW   APPearance CLEAR CLEAR   Specific Gravity, Urine 1.015 1.005 - 1.030   pH 8.5 (H) 5.0 - 8.0   Glucose, UA NEGATIVE NEGATIVE mg/dL   Hgb urine dipstick TRACE (A) NEGATIVE   Bilirubin Urine NEGATIVE NEGATIVE   Ketones, ur NEGATIVE NEGATIVE mg/dL   Protein, ur NEGATIVE NEGATIVE mg/dL   Nitrite NEGATIVE NEGATIVE   Leukocytes,Ua NEGATIVE NEGATIVE    Comment: Performed at [facility], [address] [place], [place] [address]  Urinalysis, Microscopic (reflex)     Status: Abnormal   Collection Time: 08/16/22  7:35 AM  Result Value Ref Range   RBC / HPF 0-5 0 - 5 RBC/hpf   WBC, UA 0-5  0 - 5 WBC/hpf   Bacteria, UA FEW (A) NONE SEEN   Squamous Epithelial / LPF 0-5 0 - 5   Non Squamous Epithelial PRESENT (A) NONE SEEN   Mucus PRESENT    Sperm, UA PRESENT     Comment: Performed at [facility], [address] [place], [place] [address]   CT Abdomen Pelvis Wo Contrast  Result Date: 08/16/2022 CLINICAL DATA:  Left lower quadrant abdominal pain. History of solid a Ellison syndrome. Prior appendectomy, cholecystectomy and partial gastrectomy. EXAM: CT ABDOMEN AND PELVIS WITHOUT CONTRAST TECHNIQUE: Multidetector CT imaging of the abdomen and pelvis was performed following the standard protocol without IV contrast. RADIATION DOSE REDUCTION: This exam was performed according to the departmental dose-optimization program which includes automated exposure  control, adjustment of the mA and/or kV according to patient size and/or use of iterative reconstruction technique. COMPARISON:  07/14/2017. FINDINGS: Lower chest: Centrilobular emphsyema noted. Dependent atelectasis noted in both lung bases, left greater than right. 8 mm right lower lobe pulmonary nodule on image 13/4 was 7 mm previously, compatible with benign etiology. Hepatobiliary: No suspicious focal abnormality in the liver on this study without intravenous contrast. There is no evidence for gallstones, gallbladder wall thickening, or pericholecystic fluid. No intrahepatic or extrahepatic biliary dilation. Pancreas: No focal mass lesion. No dilatation of the main duct. No intraparenchymal cyst. No peripancreatic edema. Spleen: No splenomegaly. No focal mass lesion. Adrenals/Urinary Tract: No adrenal nodule or mass. Stable cortical scarring interpolar right kidney with parenchymal calcification. 3-4 mm nonobstructing stone interpolar left kidney is new in the interval. No evidence for hydroureter. Bladder is distended. Stomach/Bowel: Stomach is markedly distended with food, fluid, and gas. There is some small bowel wall thickening in the left upper quadrant with decompressed small bowel in the right lower quadrant. Terminal ileum unremarkable. The appendix is not well visualized, but there is no edema or inflammation in the region of the cecum. Moderate to large stool volume in the ascending colon and hepatic flexure, otherwise unremarkable. Inspissated formed stool is seen in the distal transverse colon, splenic flexure, and descending colon. There is wall thickening in ill-defined colonic wall in the distal descending segment extending into the sigmoid colon where prominent lamellated formed stool is noted. There is pericolonic edema/inflammation in the sigmoid segment with scattered extraluminal gas in this region which appears to localize to the sigmoid colon in the region of the large formed stool volume  (see axial image 58/2 and coronal 48/5). Distal sigmoid colon and rectum are largely free of stool. Low rectal anastomosis evident. Vascular/Lymphatic: There is moderate atherosclerotic calcification of the abdominal aorta without aneurysm. There is no gastrohepatic or hepatoduodenal ligament lymphadenopathy. No retroperitoneal or mesenteric lymphadenopathy. No pelvic sidewall lymphadenopathy. Reproductive: The prostate gland and seminal vesicles are unremarkable. Other: Trace free fluid noted in the left paracolic gutter. Musculoskeletal: No worrisome lytic or sclerotic osseous abnormality. IMPRESSION: 1. Circumferential wall thickening in the distal descending and sigmoid colon with pericolonic edema/inflammation and scattered extraluminal gas in this region. Patient has a large amount of lamellated, inspissated formed stool in the splenic flexure, descending colon and most prominently in the sigmoid segment corresponding to the area of wall thickening and pericolonic edema/fluid/free air. Imaging features compatible with colonic perforation in the region of the sigmoid colon likely secondary to impacted stool. 2. Small bowel wall thickening with subtle ill definition of small-bowel in the left abdomen just cranial to the abnormal sigmoid colon. Appearance is felt to be secondary to  the sigmoid colon process. 3. Marked distention of the stomach with food, fluid, and gas. Imaging features are concerning for an associated component of gastric outlet obstruction/stricture at the level of the gastrojejunostomy. 4. 8 mm right lower lobe pulmonary nodule stable since prior and evaluated by lung cancer screening CT in 2020. Finding is compatible with benign disease. 5. 3-4 mm nonobstructing left renal stone. 6. Aortic Atherosclerosis (ICD10-I70.0) and Emphysema (ICD10-J43.9). I discussed these findings by telephone with Dr. Gustavus Messing at [phone] hours on 08/16/2022. Electronically Signed   By: Misty Stanley M.D.   On:  08/16/2022 07:06   DG Chest Port 1 View  Result Date: 08/16/2022 CLINICAL DATA:  Shortness of breath. EXAM: PORTABLE CHEST 1 VIEW COMPARISON:  07/27/2022 FINDINGS: 0523 hours. The lungs are clear without focal pneumonia, edema, pneumothorax or pleural effusion. Interstitial markings are diffusely coarsened with chronic features. Streaky opacity at the lung bases suggest atelectasis. Bones are demineralized. Telemetry leads overlie the chest. IMPRESSION: Chronic interstitial changes with bibasilar atelectasis. Electronically Signed   By: Misty Stanley M.D.   On: 08/16/2022 05:45      Assessment/Plan Likely stercoral ulcer perforation of colon The patient has been seen and examined, chart, labs, vitals, and imaging personally reviewed.  He clearly has significant constipation issues with what appears to almost look like calcified stool in his colon.  Suspect the pneumoperitoneum noted is likely from a stercoral perforation.  He also has a dilated stomach and NGT order has been placed but he does not have this placed as of the time of my visit.  Given his WBC and  pain, we have recommended proceeding to the OR for a Hartmann's procedure.  The patient has a significant tobacco abuse history, COPD, etc.  We discussed the possibility of him needing to remain ventilated post op along with other risks and complications as outlined by Dr. Rosendo Gros.  The patient and wife understand and agree to proceed.  We have recommended medical admission due to his chronic medical problems.   FEN - NPO/IVFs/NGT order in place VTE - SCDs in OR, to likely start post op ID - [id number]  Tobacco abuse/COPD - 2 ppd H/O substance abuse H/O opioid dependence - now on suboxone prn H/O seizure d/o Chronic pancreatitis Zollinger-Ellison syndrome GERD Hypoalbuminemic - appears malnourished at baseline due to muscle wasting  I reviewed ED provider notes, last 24 h vitals and pain scores, last 48 h intake and output, last 24 h labs  and trends, and last 24 h imaging results.  Henreitta Cea, [facility] Surgery 08/16/2022, 11:30 AM Please see Amion for pager number during day hours 7:00am-4:30pm or 7:00am -11:30am on weekends

## 2022-08-16 NOTE — ED Notes (Signed)
Transferred from [employer] [facility]  with bowel obstruction. Pt is here for a GI consult.

## 2022-08-16 NOTE — Anesthesia Procedure Notes (Signed)
Procedure Name: Intubation Date/Time: 08/16/2022 2:00 PM  Performed by: Valda Favia, CRNAPre-anesthesia Checklist: Patient identified, Emergency Drugs available, Suction available and Patient being monitored Patient Re-evaluated:Patient Re-evaluated prior to induction Oxygen Delivery Method: Circle System Utilized Preoxygenation: Pre-oxygenation with 100% oxygen Induction Type: IV induction, Rapid sequence and Cricoid Pressure applied Laryngoscope Size: Mac and 4 Grade View: Grade II Tube type: Oral Tube size: 7.5 mm Number of attempts: 1 Airway Equipment and Method: Stylet Placement Confirmation: ETT inserted through vocal cords under direct vision, positive ETCO2 and breath sounds checked- equal and bilateral Secured at: 22 cm Tube secured with: Tape Dental Injury: Teeth and Oropharynx as per pre-operative assessment

## 2022-08-16 NOTE — ED Provider Notes (Signed)
7:14 AM Care assumed from Dr. Ralene Bathe.  At time of transfer of care, patient is awaiting for callback from general surgery team in consultation for perforation of the bowel.  Anticipate ED to ED transfer to [facility] to see general surgery after they call back.  7:52 AM Just spoke to the general surgery consult team.  They are going to discuss between the [facility] and [facility] team to see which team will be more capable to take this patient to the OR for management and let me know which emergency department needs to be transferred to.  Awaiting their call back.  Spoke to general surgery who feels he would best be served going to [facility] where the have likely more space in the OR if needed.  Dr. Rosendo Gros wants patient to be ED transfer to [facility] for surgical evaluation.  Due to his other medical problems living room to arthritis on medications as well as COPD, they request medicine admit.  We will call medicine for admission.  Spoke to [facility] who will accept the patient for ED to ED transfer to the emergency department.  We will order NG tube as this was also requested by general surgery due to the gastric distention and will transfer ED to ED to see general surgery for likely operative management.  Have been trying to speak to hospitalist whom the general surgery team feels needs to admit in order to tell them the story personally versus letting the ED physician at [facility] have to call them for admission.  We will not delay care and patient will still be transferred to [facility] to see general surgery.     8:51 AM Just spoke to Dr. Tamala Julian with hospitalist service.  He reviewed the case and does suspect the patient may need to go to the operating room with general surgery.  If the patient is seen by general surgery after transfer and they still feel medicine needs to admit, the medicine team can be called at that point.  If patient needs go the OR, they requested being called after the surgical  procedure.  Patient will be transferred for surgical evaluation and disposition after.    CRITICAL CARE Performed by: Gwenyth Allegra Tysen Roesler Total critical care time: 35 minutes Critical care time was exclusive of separately billable procedures and treating other patients. Critical care was necessary to treat or prevent imminent or life-threatening deterioration. Critical care was time spent personally by me on the following activities: development of treatment plan with patient and/or surrogate as well as nursing, discussions with consultants, evaluation of patient's response to treatment, examination of patient, obtaining history from patient or surrogate, ordering and performing treatments and interventions, ordering and review of laboratory studies, ordering and review of radiographic studies, pulse oximetry and re-evaluation of patient's condition.    Melora Menon, Gwenyth Allegra, MD 08/16/22 [phone]

## 2022-08-16 NOTE — ED Notes (Signed)
Attempted catheter insertion, cath inserted without resistance but no output. MD and RN assessed catheter for patency and MD pulled catheter out due to no urinary drainage. Pt attempted to use urinal at the bedside without success.

## 2022-08-16 NOTE — H&P (Signed)
History and Physical    Patient: Jerry Knight [id number]:[id number] DOB: [birth year] DOA: 08/16/2022 DOS: the patient was seen and examined on 08/16/2022 PCP: Beverley Fiedler, FNP  Patient coming from: transfer from [place]  Chief Complaint:  Chief Complaint  Patient presents with   Abdominal Pain   HPI: Jerry Knight is a 61 y.o. male with medical history significant of  COPD, chronic pancreatitis, rheumatoid arthritis on Enbrel, history of seizures not on an AED, chronic pain on Suboxone, polysubstance abuse, solar Ellison syndrome, and constipation who presented with complaints of  lower abdominal pain.  Patient noted symptoms possibly started a week ago and have progressively worsened.  He had a small bowel movement, but had not been able to have a bowel movement in 4 days prior to that.  He reported having nausea and vomiting.  Emesis was noted to be of   In the emergency department patient was noted to be tachycardic and tachypneic with all other vital signs maintained.  Labs significant for WBC 22.4, hemoglobin 12.6, potassium 3.4, and lactic acid 3.1. CT concerning for colonic perforation in the region of the sigmoid colon thought secondary to impacted stool. For distention of the stomach concerning for gastric outlet obstruction/stricture at the level of the gastrojejunostomy. Urinalysis did not give significant concern for infection.  Blood cultures had been obtained.  [facility] was initially called to admit the patient, but admission was deferred as surgery was thought to be needed.   Patient had been given IV ketamine, normal saline IV fluids, and Zosyn.  Patient was transferred ED to ED due to severity of his symptoms.  General surgery took patient to the operating room room for exploratory laparotomy where patient was noted to have a large stercoral ulcer into the mesentery with perforation and purulent peritonitis.  Patient underwent partial sigmoid colectomy with colostomy formation.   After the procedure patient was able to be extubated and clinically stable for which [facility] was called to admit.  Review of Systems: As mentioned in the history of present illness. All other systems reviewed and are negative. Past Medical History:  Diagnosis Date   Blood transfusion without reported diagnosis    Chest pain    Chronic neck pain    Chronic pain    Chronic pancreatitis (HCC)    COPD (chronic obstructive pulmonary disease) (HCC)    Degenerative disk disease    C 5 &C 6   Depression    GI bleed    Gout    Migraine    Migraine headache    Pain management contract agreement    Seizure disorder ([place])    x 7 years   Seizures ([place])    Substance abuse ([place])    Has been cleaned for 18 mos   Zollinger-Ellison syndrome    Past Surgical History:  Procedure Laterality Date   APPENDECTOMY     per CT 2012- s/p appendectomy   CHOLECYSTECTOMY     EYE SURGERY     HERNIA REPAIR     PARTIAL GASTRECTOMY     SMALL INTESTINE SURGERY     Social History:  reports that he has been smoking cigarettes. He has been smoking an average of 2 packs per day. He has never used smokeless tobacco. He reports current drug use. Drugs: Marijuana and Other-see comments. He reports that he does not drink alcohol.  Allergies  Allergen Reactions   Ambien [Zolpidem Tartrate] Other (See Comments)    Black out   Iohexol  Pt claims he has chest tightness and sob after iv contrast injections.   Lunesta [Eszopiclone] Other (See Comments)   Seroquel [Quetiapine Fumarate]     Blackout   Sonata [Zaleplon]     "black out'  confused    Family History  Problem Relation Age of Onset   Angina Mother    Nephrolithiasis Father    Cancer Brother     Prior to Admission medications   Medication Sig Start Date End Date Taking? Authorizing Provider  amitriptyline (ELAVIL) 50 MG tablet Take 100 mg by mouth at bedtime.   Yes [provider]  atorvastatin (LIPITOR) 20 MG tablet Take 1 tablet (20 mg  total) by mouth daily. 02/19/21  Yes Just, Laurita Quint, FNP  buprenorphine-naloxone (SUBOXONE) 8-2 mg SUBL SL tablet Place 1 tablet under the tongue daily.   Yes [provider]  cyclobenzaprine (FLEXERIL) 10 MG tablet Take 1 tablet (10 mg total) by mouth 3 (three) times daily. 02/19/21  Yes Just, Laurita Quint, FNP  etanercept (ENBREL) 50 MG/ML injection Inject 50 mg into the skin once a week.   Yes [provider]  folic acid (FOLVITE) 1 MG tablet Take 1 mg by mouth daily. 08/06/22  Yes [provider]  linaclotide Rolan Lipa) 145 MCG CAPS capsule Take 1 capsule (145 mcg total) by mouth daily before breakfast. 02/19/21  Yes Just, Laurita Quint, FNP  LORazepam (ATIVAN) 1 MG tablet Take 1 mg by mouth 3 (three) times daily. 07/18/22  Yes [provider]  methotrexate (RHEUMATREX) 2.5 MG tablet Take 10 mg by mouth once a week. 06/08/22  Yes [provider]  mirtazapine (REMERON) 45 MG tablet Take 1 tablet (45 mg total) by mouth at bedtime. 02/19/21  Yes Just, Laurita Quint, FNP  omeprazole (PRILOSEC) 20 MG capsule TAKE 1 CAPSULE(20 MG) BY MOUTH DAILY 02/19/21  Yes Just, Laurita Quint, FNP  traZODone (DESYREL) 50 MG tablet Take 3-4 tablets (150-200 mg total) by mouth at bedtime. 02/19/21  Yes Just, Laurita Quint, FNP    Physical Exam: Vitals:   08/16/22 0850 08/16/22 0953 08/16/22 1045 08/16/22 1115  BP: 122/83 120/79 (!) 140/88 136/79  Pulse: (!) 114 (!) 112 (!) 113 (!) 112  Resp: 19 (!) 9 (!) 24 20  Temp: 98.7 F (37.1 C) 98.8 F (37.1 C)  98.9 F (37.2 C)  TempSrc: Oral Oral    SpO2: 96% 96% 98% 95%  Weight:      Height:       Constitutional: Chronic ill-appearing male currently in no acute distress Eyes: PERRL, lids and conjunctivae normal ENMT: Mucous membranes are dry.  NG tube in place with dark material draining to canister. Neck: normal, supple Respiratory: clear to auscultation bilaterally, no wheezing, no crackles. Normal respiratory effort. No accessory muscle use.   Patient currently on 2 L of nasal cannula oxygen with O2 saturations maintained. Cardiovascular: Regular rate and rhythm, no murmurs / rubs / gallops. No extremity edema.   Abdomen: Abdominal tenderness appreciated with JP drain and colostomy present Musculoskeletal: no clubbing / cyanosis. No joint deformity upper and lower extremities.  Skin: no rashes, lesions, ulcers. No induration Neurologic: CN 2-12 grossly intact.  Strength 5/5 in all 4.  Psychiatric: Normal judgment and insight. Alert and oriented x 3. Normal mood.   Data Reviewed:  EKG reveals sinus tachycardia 108 bpm Assessment and Plan:  Sepsis   Bowel perforation secondary to stercoral ulcer Patient presented with complaints of progressively worsening lower abdominal pain and constipation.  Noted  to be tachycardic and tachypneic with initial WBC 22.4 and lactic acid 3.1.  CT revealed colonic perforation in the region of the sigmoid colon thought secondary to impacted stool. For distention of the stomach concerning for gastric outlet obstruction/stricture at the level of the gastrojejunostomy.  Patient was started on empiric antibiotics of Zosyn.  Patient was taken to the operating room by general surgery where he was noted to have a large stercoral ulcer into the mesentery with perforation and purulent peritonitis.  Patient underwent partial sigmoid colectomy with formation of colostomy.  Patient -Admit to a surgical telemetry bed -Follow-up blood cultures -Follow-up pathology from surgical specimen sent -N.p.o. with NGT in place to suction -Continue Zosyn -As needed pain regimen added by surgery -Trend lactic acid level -Recheck CBC tomorrow -Appreciate general surgery consultative services, follow-up for any further recommendation  Normocytic anemia Hemoglobin was 12.6 g/dL. -Recheck CBC tomorrow morning  Hypokalemia Acute.  Initial potassium 3.4. -0.45% normal saline 20 mEq of potassium chloride at 75 mL/h x 15 hours.   Reassess fluids and adjust as needed in a.m. -Continue to monitor and replace as needed  COPD, without exacerbation Chest x-ray noted chronic interstitial lung changes with bibasilar atelectasis. -Albuterol nebs as needed  Rheumatoid arthritis Patient had been on Enbrel 50 mg and methotrexate 10 mg on Saturdays. -Hold current medication regimen  Anxiety Home medication regimen include amitriptyline 100 mg nightly, Ativan 1 mg 3 times daily, and mirtazapine 45 mg nightly. -Ativan 0.5 mg every 4 hours as needed for anxiety  GERD Home medication regimen includes omeprazole 20 mg daily. -Protonix 40 mg IV twice daily  Chronic pain  Patient has been on Suboxone. Suboxone currently held due to patient being n.p.o.  Tobacco abuse Patient reportedly smokes 2 packs of cigarettes per day on average. -Nicotine patch offered  DVT prophylaxis: SCDs Advance Care Planning:   Code Status: Full Code  Consults: General surgery  Severity of Illness: The appropriate patient status for this patient is INPATIENT. Inpatient status is judged to be reasonable and necessary in order to provide the required intensity of service to ensure the patient's safety. The patient's presenting symptoms, physical exam findings, and initial radiographic and laboratory data in the context of their chronic comorbidities is felt to place them at high risk for further clinical deterioration. Furthermore, it is not anticipated that the patient will be medically stable for discharge from the hospital within 2 midnights of admission.   * I certify that at the point of admission it is my clinical judgment that the patient will require inpatient hospital care spanning beyond 2 midnights from the point of admission due to high intensity of service, high risk for further deterioration and high frequency of surveillance required.*  Author: Norval Morton, MD 08/16/2022 3:39 PM  For on call review [email or web address].

## 2022-08-17 DIAGNOSIS — K631 Perforation of intestine (nontraumatic): Secondary | ICD-10-CM | POA: Diagnosis not present

## 2022-08-17 LAB — HIV ANTIBODY (ROUTINE TESTING W REFLEX): HIV Screen 4th Generation wRfx: NONREACTIVE

## 2022-08-17 LAB — COMPREHENSIVE METABOLIC PANEL
ALT: 12 U/L (ref 0–44)
AST: 19 U/L (ref 15–41)
Albumin: 2.4 g/dL — ABNORMAL LOW (ref 3.5–5.0)
Alkaline Phosphatase: 45 U/L (ref 38–126)
Anion gap: 10 (ref 5–15)
BUN: 10 mg/dL (ref 6–20)
CO2: 21 mmol/L — ABNORMAL LOW (ref 22–32)
Calcium: 8 mg/dL — ABNORMAL LOW (ref 8.9–10.3)
Chloride: 105 mmol/L (ref 98–111)
Creatinine, Ser: 0.68 mg/dL (ref 0.61–1.24)
GFR, Estimated: >60 mL/min (ref >60)
Glucose, Bld: 119 mg/dL — ABNORMAL HIGH (ref 70–99)
Potassium: 3.6 mmol/L (ref 3.5–5.1)
Sodium: 136 mmol/L (ref 135–145)
Total Bilirubin: 1.3 mg/dL — ABNORMAL HIGH (ref 0.3–1.2)
Total Protein: 5 g/dL — ABNORMAL LOW (ref 6.5–8.1)

## 2022-08-17 LAB — MAGNESIUM: Magnesium: 1.2 mg/dL — ABNORMAL LOW (ref 1.7–2.4)

## 2022-08-17 MED ORDER — DIPHENHYDRAMINE HCL 50 MG/ML IJ SOLN: 12.5000 mg | Freq: Four times a day (QID) | INTRAMUSCULAR | Status: DC | PRN

## 2022-08-17 MED ORDER — NALOXONE HCL 0.4 MG/ML IJ SOLN: 0.4000 mg | INTRAMUSCULAR | Status: DC | PRN

## 2022-08-17 MED ORDER — DIPHENHYDRAMINE HCL 12.5 MG/5ML PO ELIX
12.5000 mg | ORAL_SOLUTION | Freq: Four times a day (QID) | ORAL | Status: DC | PRN
  Filled 2022-08-17: qty 10

## 2022-08-17 MED ORDER — PIPERACILLIN-TAZOBACTAM 3.375 G IVPB
3.3750 g | Freq: Three times a day (TID) | INTRAVENOUS | Status: DC
  Administered 2022-08-17 – 2022-08-27 (×30): 3.375 g via INTRAVENOUS
  Filled 2022-08-17 (×31): qty 50

## 2022-08-17 MED ORDER — SODIUM CHLORIDE 0.9% FLUSH: 9.0000 mL | INTRAVENOUS | Status: DC | PRN

## 2022-08-17 MED ORDER — MORPHINE SULFATE 1 MG/ML IV SOLN PCA
INTRAVENOUS | Status: DC
  Administered 2022-08-19: 31.2 mg via INTRAVENOUS
  Administered 2022-08-19: 5.05 mg via INTRAVENOUS
  Administered 2022-08-20: 13.5 mg via INTRAVENOUS
  Administered 2022-08-20: 16.33 mg via INTRAVENOUS
  Administered 2022-08-20: 9 mg via INTRAVENOUS
  Administered 2022-08-20: 6 mg via INTRAVENOUS
  Administered 2022-08-20: 25.5 mg via INTRAVENOUS
  Administered 2022-08-21: 9 mg via INTRAVENOUS
  Administered 2022-08-21: 30 mg via INTRAVENOUS
  Administered 2022-08-22: 1 mg via INTRAVENOUS
  Administered 2022-08-23: 19.5 mg via INTRAVENOUS
  Administered 2022-08-24: 7.5 mg via INTRAVENOUS
  Administered 2022-08-24: 1 mg via INTRAVENOUS
  Administered 2022-08-24: 10.5 mg via INTRAVENOUS
  Administered 2022-08-25: 6.5 mg via INTRAVENOUS
  Administered 2022-08-26: 28.5 mg via INTRAVENOUS
  Administered 2022-08-27: 13.84 mg via INTRAVENOUS
  Administered 2022-08-27: 13.5 mg via INTRAVENOUS
  Filled 2022-08-17 (×30): qty 30

## 2022-08-17 MED ORDER — ACETAMINOPHEN 10 MG/ML IV SOLN
1000.0000 mg | Freq: Once | INTRAVENOUS | Status: AC
  Administered 2022-08-17: 1000 mg via INTRAVENOUS
  Filled 2022-08-17: qty 100

## 2022-08-17 NOTE — Progress Notes (Signed)
PROGRESS NOTE    Jerry Knight  BZJ:[id number] DOB: Oct 30, 1961 DOA: 08/16/2022 PCP: Beverley Fiedler, FNP   Brief Narrative:  Jerry Knight is a 61 y.o. male with medical history significant of  COPD, chronic pancreatitis, rheumatoid arthritis on Enbrel, history of seizures not on an AED, chronic pain on Suboxone, polysubstance abuse, Zollinger-Ellison syndrome, and constipation who presented with complaints of  lower abdominal pain. Imaging remarkable for colonic perforation/obstruction - transferred to main campus for surgical intervention. Hospitalist called for consult.   Assessment & Plan:   Principal Problem:   Bowel perforation (HCC) Active Problems:   Sepsis ([place])   Normocytic anemia   Hypokalemia   COPD (chronic obstructive pulmonary disease) (HCC)   Rheumatoid arthritis (HCC)   Anxiety   GERD (gastroesophageal reflux disease)   Chronic neck and back pain   Tobacco abuse   Sepsis secondary to bowel perforation secondary to stercoral ulcer Exploratory laparotomy, Left colostomy and partial sigmoid colectomy (08/16/22) -Tachycardic and tachypneic with initial WBC 22.4 and lactic acid 3.1.   -CT revealed colonic perforation in the region of the sigmoid colon thought secondary to impacted stool.  -Continue Zosyn -Surgery as above 08/16/22 - tolerated well -Currently NPO with ostomy, NG to intermittent suction and dilaudid PCA pump per [facility] - appreciate insight/recs   Normocytic anemia Likely chronic anemia of chronic disease given his history/comorbidities Post op/hemodilutional - follow repeat labs   Hypokalemia, resolved -Continue IVF/supplement while NPO perioperatively   COPD, without exacerbation Chest x-ray noted chronic interstitial lung changes with bibasilar atelectasis. -Albuterol nebs as needed   Rheumatoid arthritis Patient had been on Enbrel 50 mg and methotrexate 10 mg on Saturdays. -Hold current medication regimen given above    Anxiety Home medication regimen include amitriptyline '100mg'$  HS, Ativan 1 mg TID, and mirtazapine '45mg'$  HS. -Ativan 0.5 mg every 4 hours as needed for anxiety until PO route established   GERD -Protonix 40 mg IV twice daily   Chronic pain  Patient has been on Suboxone. Continue dilaudid PCA per surgery   Tobacco abuse Patient reportedly smokes 2 packs of cigarettes per day on average. -Nicotine patch offered   DVT prophylaxis: SCDs Advance Care Planning:   Code Status: Full Code  Consults: General surgery  Status is: inpt  Dispo: The patient is from: home              Anticipated d/c is to: TBD              Anticipated d/c date is: 72+h              Patient currently NOT medically stable for discharge  Consultants:  Gen [facility]x  Procedures:  Exploratory laparotomy, Left colostomy and partial sigmoid colectomy (08/16/22)  Antimicrobials:  Single dose given preoperatively - will resume zosyn for now and consider narrowing based on cultures.  Subjective: No acute issues/events overnight - pain moderately well controlled on dilaudid PCA - requesting to speak to surgery about options for a morphine PCA.  Objective: Vitals:   08/17/22 0359 08/17/22 0411 08/17/22 0415 08/17/22 0758  BP: 97/67   94/67  Pulse: 99 99  99  Resp: '11 11 13 16  '$ Temp: 98.7 F (37.1 C)   98.1 F (36.7 C)  TempSrc: Oral   Oral  SpO2: 95% 93% 99% 94%  Weight:      Height:        Intake/Output Summary (Last 24 hours) at 08/17/2022 0806 Last data filed at 08/17/2022 0700  Gross per 24 hour  Intake 3447.57 ml  Output 2775 ml  Net 672.57 ml   Filed Weights   08/16/22 0501  Weight: 52.6 kg    Examination:  General exam: Appears calm and comfortable  HEENT: NG intact - bilious liquid draining Respiratory system: Clear to auscultation. Respiratory effort normal. Cardiovascular system: S1 & S2 heard, RRR. No JVD, murmurs, rubs, gallops or clicks. No pedal edema. Gastrointestinal system: diffusely  tender, non-distended, ostomy intact Central nervous system: Alert and oriented. No focal neurological deficits. Extremities: Symmetric 5 x 5 power. Skin: No rashes, lesions or ulcers  Data Reviewed: I have personally reviewed following labs and imaging studies  CBC: Recent Labs  Lab 08/16/22 0506  WBC 22.4*  NEUTROABS 19.7*  HGB 12.6*  HCT 38.0*  MCV 92.5  PLT 885   Basic Metabolic Panel: Recent Labs  Lab 08/16/22 0506 08/17/22 0048  NA 136 136  K 3.4* 3.6  CL 105 105  CO2 24 21*  GLUCOSE 176* 119*  BUN 11 10  CREATININE 0.82 0.68  CALCIUM 8.5* 8.0*  MG  --  1.2*   GFR: Estimated Creatinine Clearance: 74 mL/min (by C-G formula based on SCr of 0.68 mg/dL). Liver Function Tests: Recent Labs  Lab 08/16/22 0506 08/17/22 0048  AST 24 19  ALT 11 12  ALKPHOS 94 45  BILITOT 0.7 1.3*  PROT 7.3 5.0*  ALBUMIN 3.4* 2.4*   Recent Labs  Lab 08/16/22 0506  LIPASE 39   No results for input(s): "AMMONIA" in the last 168 hours. Coagulation Profile: No results for input(s): "INR", "PROTIME" in the last 168 hours. Cardiac Enzymes: No results for input(s): "CKTOTAL", "CKMB", "CKMBINDEX", "TROPONINI" in the last 168 hours. BNP (last 3 results) No results for input(s): "PROBNP" in the last 8760 hours. HbA1C: No results for input(s): "HGBA1C" in the last 72 hours. CBG: No results for input(s): "GLUCAP" in the last 168 hours. Lipid Profile: No results for input(s): "CHOL", "HDL", "LDLCALC", "TRIG", "CHOLHDL", "LDLDIRECT" in the last 72 hours. Thyroid Function Tests: No results for input(s): "TSH", "T4TOTAL", "FREET4", "T3FREE", "THYROIDAB" in the last 72 hours. Anemia Panel: No results for input(s): "VITAMINB12", "FOLATE", "FERRITIN", "TIBC", "IRON", "RETICCTPCT" in the last 72 hours. Sepsis Labs: Recent Labs  Lab 08/16/22 0506 08/16/22 1820 08/16/22 2138  LATICACIDVEN 3.1* 3.8* 3.1*    No results found for this or any previous visit (from the past 240 hour(s)).        Radiology Studies: CT Abdomen Pelvis Wo Contrast  Result Date: 08/16/2022 CLINICAL DATA:  Left lower quadrant abdominal pain. History of solid a Ellison syndrome. Prior appendectomy, cholecystectomy and partial gastrectomy. EXAM: CT ABDOMEN AND PELVIS WITHOUT CONTRAST TECHNIQUE: Multidetector CT imaging of the abdomen and pelvis was performed following the standard protocol without IV contrast. RADIATION DOSE REDUCTION: This exam was performed according to the departmental dose-optimization program which includes automated exposure control, adjustment of the mA and/or kV according to patient size and/or use of iterative reconstruction technique. COMPARISON:  07/14/2017. FINDINGS: Lower chest: Centrilobular emphsyema noted. Dependent atelectasis noted in both lung bases, left greater than right. 8 mm right lower lobe pulmonary nodule on image 13/4 was 7 mm previously, compatible with benign etiology. Hepatobiliary: No suspicious focal abnormality in the liver on this study without intravenous contrast. There is no evidence for gallstones, gallbladder wall thickening, or pericholecystic fluid. No intrahepatic or extrahepatic biliary dilation. Pancreas: No focal mass lesion. No dilatation of the main duct. No intraparenchymal cyst. No peripancreatic edema. Spleen:  No splenomegaly. No focal mass lesion. Adrenals/Urinary Tract: No adrenal nodule or mass. Stable cortical scarring interpolar right kidney with parenchymal calcification. 3-4 mm nonobstructing stone interpolar left kidney is new in the interval. No evidence for hydroureter. Bladder is distended. Stomach/Bowel: Stomach is markedly distended with food, fluid, and gas. There is some small bowel wall thickening in the left upper quadrant with decompressed small bowel in the right lower quadrant. Terminal ileum unremarkable. The appendix is not well visualized, but there is no edema or inflammation in the region of the cecum. Moderate to large stool  volume in the ascending colon and hepatic flexure, otherwise unremarkable. Inspissated formed stool is seen in the distal transverse colon, splenic flexure, and descending colon. There is wall thickening in ill-defined colonic wall in the distal descending segment extending into the sigmoid colon where prominent lamellated formed stool is noted. There is pericolonic edema/inflammation in the sigmoid segment with scattered extraluminal gas in this region which appears to localize to the sigmoid colon in the region of the large formed stool volume (see axial image 58/2 and coronal 48/5). Distal sigmoid colon and rectum are largely free of stool. Low rectal anastomosis evident. Vascular/Lymphatic: There is moderate atherosclerotic calcification of the abdominal aorta without aneurysm. There is no gastrohepatic or hepatoduodenal ligament lymphadenopathy. No retroperitoneal or mesenteric lymphadenopathy. No pelvic sidewall lymphadenopathy. Reproductive: The prostate gland and seminal vesicles are unremarkable. Other: Trace free fluid noted in the left paracolic gutter. Musculoskeletal: No worrisome lytic or sclerotic osseous abnormality. IMPRESSION: 1. Circumferential wall thickening in the distal descending and sigmoid colon with pericolonic edema/inflammation and scattered extraluminal gas in this region. Patient has a large amount of lamellated, inspissated formed stool in the splenic flexure, descending colon and most prominently in the sigmoid segment corresponding to the area of wall thickening and pericolonic edema/fluid/free air. Imaging features compatible with colonic perforation in the region of the sigmoid colon likely secondary to impacted stool. 2. Small bowel wall thickening with subtle ill definition of small-bowel in the left abdomen just cranial to the abnormal sigmoid colon. Appearance is felt to be secondary to the sigmoid colon process. 3. Marked distention of the stomach with food, fluid, and gas.  Imaging features are concerning for an associated component of gastric outlet obstruction/stricture at the level of the gastrojejunostomy. 4. 8 mm right lower lobe pulmonary nodule stable since prior and evaluated by lung cancer screening CT in 2020. Finding is compatible with benign disease. 5. 3-4 mm nonobstructing left renal stone. 6. Aortic Atherosclerosis (ICD10-I70.0) and Emphysema (ICD10-J43.9). I discussed these findings by telephone with Dr. Gustavus Messing at [phone] hours on 08/16/2022. Electronically Signed   By: Misty Stanley M.D.   On: 08/16/2022 07:06   DG Chest Port 1 View  Result Date: 08/16/2022 CLINICAL DATA:  Shortness of breath. EXAM: PORTABLE CHEST 1 VIEW COMPARISON:  07/27/2022 FINDINGS: 0523 hours. The lungs are clear without focal pneumonia, edema, pneumothorax or pleural effusion. Interstitial markings are diffusely coarsened with chronic features. Streaky opacity at the lung bases suggest atelectasis. Bones are demineralized. Telemetry leads overlie the chest. IMPRESSION: Chronic interstitial changes with bibasilar atelectasis. Electronically Signed   By: Misty Stanley M.D.   On: 08/16/2022 05:45    Scheduled Meds:  enoxaparin (LOVENOX) injection  40 mg Subcutaneous Q24H   HYDROmorphone   Intravenous Q4H   nicotine  21 mg Transdermal QHS   pantoprazole (PROTONIX) IV  40 mg Intravenous Q12H   sodium chloride flush  3 mL Intravenous Q12H   Continuous  Infusions:  0.45 % NaCl with KCl 20 mEq / L 75 mL/hr at 08/17/22 0700   acetaminophen Stopped (08/17/22 0437)   methocarbamol (ROBAXIN) IV       LOS: 1 day   Time spent: 9mn  Capri Raben C Matheu Ploeger, DO [facility]  If 7PM-7AM, please contact night-coverage [email or web address]  08/17/2022, 8:06 AM

## 2022-08-17 NOTE — Progress Notes (Addendum)
Patient ID: Jerry Knight, male   DOB: [birth year], 61 y.o.   MRN: [id number] [facility] Surgery Progress Note:   1 Day Post-Op   THE PLAN  NG tube with moderate dark green output.  Continue suction until good ostomy output  Subjective: Mental status is fairly clear.  Complaints uncomfortable with NG and surgery incision. Objective: Vital signs in last 24 hours: Temp:  [97.7 F (36.5 C)-98.9 F (37.2 C)] 98.1 F (36.7 C) (09/16 0758) Pulse Rate:  [98-113] 99 (09/16 0758) Resp:  [9-24] 16 (09/16 0758) BP: (94-140)/(60-88) 94/67 (09/16 0758) SpO2:  [91 %-100 %] 94 % (09/16 0758)  Intake/Output from previous day: 09/15 0701 - 09/16 0700 In: 3997.6 [I.V.:2297.6; IV Piggyback:1700] Out: 2775 [Urine:1485; Emesis/NG output:500; Drains:190; Blood:100] Intake/Output this shift: No intake/output data recorded.  Physical Exam: Work of breathing is not labored.  NG in place with much output.  Ostomy without significant output.    Lab Results:  Results for orders placed or performed during the hospital encounter of 08/16/22 (from the past 48 hour(s))  Comprehensive metabolic panel     Status: Abnormal   Collection Time: 08/16/22  5:06 AM  Result Value Ref Range   Sodium 136 135 - 145 mmol/L   Potassium 3.4 (L) 3.5 - 5.1 mmol/L   Chloride 105 98 - 111 mmol/L   CO2 24 22 - 32 mmol/L   Glucose, Bld 176 (H) 70 - 99 mg/dL    Comment: Glucose reference range applies only to samples taken after fasting for at least 8 hours.   BUN 11 6 - 20 mg/dL   Creatinine, Ser 0.82 0.61 - 1.24 mg/dL   Calcium 8.5 (L) 8.9 - 10.3 mg/dL   Total Protein 7.3 6.5 - 8.1 g/dL   Albumin 3.4 (L) 3.5 - 5.0 g/dL   AST 24 15 - 41 U/L   ALT 11 0 - 44 U/L   Alkaline Phosphatase 94 38 - 126 U/L   Total Bilirubin 0.7 0.3 - 1.2 mg/dL   GFR, Estimated >60 >60 mL/min    Comment: (NOTE) Calculated using the CKD-EPI Creatinine Equation (2021)    Anion gap 7 5 - 15    Comment: Performed at [facility],  [address] [place], [place] [address]  CBC with Differential     Status: Abnormal   Collection Time: 08/16/22  5:06 AM  Result Value Ref Range   WBC 22.4 (H) 4.0 - 10.5 K/uL    Comment: REPEATED TO VERIFY   RBC 4.11 (L) 4.22 - 5.81 MIL/uL   Hemoglobin 12.6 (L) 13.0 - 17.0 g/dL   HCT 38.0 (L) 39.0 - 52.0 %   MCV 92.5 80.0 - 100.0 fL   MCH 30.7 26.0 - 34.0 pg   MCHC 33.2 30.0 - 36.0 g/dL   RDW 13.3 11.5 - 15.5 %   Platelets 235 150 - 400 K/uL   nRBC 0.0 0.0 - 0.2 %   Neutrophils Relative % 88 %   Neutro Abs 19.7 (H) 1.7 - 7.7 K/uL   Lymphocytes Relative 5 %   Lymphs Abs 1.1 0.7 - 4.0 K/uL   Monocytes Relative 6 %   Monocytes Absolute 1.4 (H) 0.1 - 1.0 K/uL   Eosinophils Relative 0 %   Eosinophils Absolute 0.0 0.0 - 0.5 K/uL   Basophils Relative 0 %   Basophils Absolute 0.0 0.0 - 0.1 K/uL   Immature Granulocytes 1 %   Abs Immature Granulocytes 0.11 (H) 0.00 - 0.07  K/uL    Comment: Performed at [facility], [address] [place], [place] [address]  Lactic acid, plasma     Status: Abnormal   Collection Time: 08/16/22  5:06 AM  Result Value Ref Range   Lactic Acid, Venous 3.1 (HH) 0.5 - 1.9 mmol/L    Comment: CRITICAL RESULT CALLED TO, READ BACK BY AND VERIFIED WITH Marsa Aris, RN ON 08/16/22 AT 0556 BY JAG Performed at [facility], [address] [place], [place] [address]   Lipase, blood     Status: None   Collection Time: 08/16/22  5:06 AM  Result Value Ref Range   Lipase 39 11 - 51 U/L    Comment: Performed at [facility], [address] [place], [place] [address]  Troponin I (High Sensitivity)     Status: None   Collection Time: 08/16/22  5:22 AM  Result Value Ref Range   Troponin I (High Sensitivity) 3 <18 ng/L    Comment: (NOTE) Elevated high sensitivity troponin I (hsTnI) values and significant  changes across serial measurements may suggest ACS but many other  chronic and acute conditions are known to elevate hsTnI  results.  Refer to the "Links" section for chest pain algorithms and additional  guidance. Performed at [facility], [address] [place], [place] [address]   Culture, blood (routine x 2)     Status: None (Preliminary result)   Collection Time: 08/16/22  6:55 AM   Specimen: BLOOD  Result Value Ref Range   Specimen Description      BLOOD RIGHT ANTECUBITAL Performed at [facility], [address] [place], [place] [address]    Special Requests      Blood Culture results may not be optimal due to an inadequate volume of blood received in culture bottles [place] Performed at [facility], [address] [place], [place] [address]    Culture      NO GROWTH < 24 HOURS Performed at [facility], [place] [address], [place], [place] [address]    Report Status PENDING   Culture, blood (routine x 2)     Status: None (Preliminary result)   Collection Time: 08/16/22  7:08 AM   Specimen: BLOOD RIGHT HAND  Result Value Ref Range   Specimen Description      BLOOD RIGHT HAND Performed at [facility], [address] [place], [place] [address]    Special Requests      BOTTLES DRAWN AEROBIC AND ANAEROBIC Blood Culture adequate volume Performed at [facility], [address] [place], [place] [address]    Culture      NO GROWTH < 24 HOURS Performed at [facility], [place] [address], [place], [place] [address]    Report Status PENDING   Urinalysis, Routine w reflex microscopic Urine, Clean Catch     Status: Abnormal   Collection Time: 08/16/22  7:35 AM  Result Value Ref Range   Color, Urine YELLOW YELLOW   APPearance CLEAR CLEAR   Specific Gravity, Urine 1.015 1.005 - 1.030   pH 8.5 (H) 5.0 - 8.0   Glucose, UA NEGATIVE NEGATIVE mg/dL   Hgb urine dipstick TRACE (A) NEGATIVE   Bilirubin Urine NEGATIVE NEGATIVE   Ketones, ur NEGATIVE NEGATIVE mg/dL   Protein, ur NEGATIVE NEGATIVE mg/dL    Nitrite NEGATIVE NEGATIVE  Leukocytes,Ua NEGATIVE NEGATIVE    Comment: Performed at [facility], [address] [place], [place] [address]  Urinalysis, Microscopic (reflex)     Status: Abnormal   Collection Time: 08/16/22  7:35 AM  Result Value Ref Range   RBC / HPF 0-5 0 - 5 RBC/hpf   WBC, UA 0-5 0 - 5 WBC/hpf   Bacteria, UA FEW (A) NONE SEEN   Squamous Epithelial / LPF 0-5 0 - 5   Non Squamous Epithelial PRESENT (A) NONE SEEN   Mucus PRESENT    Sperm, UA PRESENT     Comment: Performed at [facility], [address] [place], [place] [address]  Type and screen [place] For surgery this morning.     Status: None   Collection Time: 08/16/22 11:35 AM  Result Value Ref Range   ABO/RH(D) O NEG    Antibody Screen NEG    Sample Expiration      08/19/2022,2359 Performed at [facility], [place] [address] [place], [place] [address]   ABO/Rh     Status: None   Collection Time: 08/16/22 11:40 AM  Result Value Ref Range   ABO/RH(D)      O NEG Performed at [facility] [address] [place], [place] [address]   Lactic acid, plasma     Status: Abnormal   Collection Time: 08/16/22  6:20 PM  Result Value Ref Range   Lactic Acid, Venous 3.8 (HH) 0.5 - 1.9 mmol/L    Comment: CRITICAL VALUE NOTED. VALUE IS CONSISTENT WITH PREVIOUSLY REPORTED/CALLED VALUE Performed at [facility], [place] [address], [place], [place] [address]   Lactic acid, plasma     Status: Abnormal   Collection Time: 08/16/22  9:38 PM  Result Value Ref Range   Lactic Acid, Venous 3.1 (HH) 0.5 - 1.9 mmol/L    Comment: CRITICAL VALUE NOTED. VALUE IS CONSISTENT WITH PREVIOUSLY REPORTED/CALLED VALUE Performed at [facility], [place] [address], [place], [place] [address]   HIV Antibody (routine testing w rflx)     Status: None   Collection Time: 08/17/22 12:48 AM  Result Value Ref Range   HIV Screen 4th Generation wRfx Non Reactive Non Reactive    Comment:  Performed at [facility], [place] [address], [place],  [address]  Comprehensive metabolic panel     Status: Abnormal   Collection Time: 08/17/22 12:48 AM  Result Value Ref Range   Sodium 136 135 - 145 mmol/L   Potassium 3.6 3.5 - 5.1 mmol/L   Chloride 105 98 - 111 mmol/L   CO2 21 (L) 22 - 32 mmol/L   Glucose, Bld 119 (H) 70 - 99 mg/dL    Comment: Glucose reference range applies only to samples taken after fasting for at least 8 hours.   BUN 10 6 - 20 mg/dL   Creatinine, Ser 0.68 0.61 - 1.24 mg/dL   Calcium 8.0 (L) 8.9 - 10.3 mg/dL   Total Protein 5.0 (L) 6.5 - 8.1 g/dL   Albumin 2.4 (L) 3.5 - 5.0 g/dL   AST 19 15 - 41 U/L   ALT 12 0 - 44 U/L   Alkaline Phosphatase 45 38 - 126 U/L   Total Bilirubin 1.3 (H) 0.3 - 1.2 mg/dL   GFR, Estimated >60 >60 mL/min    Comment: (NOTE) Calculated using the CKD-EPI Creatinine Equation (2021)    Anion gap 10 5 - 15  Comment: Performed at [facility], [place] [address], [place], [place] [address]  Magnesium     Status: Abnormal   Collection Time: 08/17/22 12:48 AM  Result Value Ref Range   Magnesium 1.2 (L) 1.7 - 2.4 mg/dL    Comment: Performed at [facility] [place] [address] [place], [place] [address]    Radiology/Results: CT Abdomen Pelvis Wo Contrast  Result Date: 08/16/2022 CLINICAL DATA:  Left lower quadrant abdominal pain. History of solid a Ellison syndrome. Prior appendectomy, cholecystectomy and partial gastrectomy. EXAM: CT ABDOMEN AND PELVIS WITHOUT CONTRAST TECHNIQUE: Multidetector CT imaging of the abdomen and pelvis was performed following the standard protocol without IV contrast. RADIATION DOSE REDUCTION: This exam was performed according to the departmental dose-optimization program which includes automated exposure control, adjustment of the mA and/or kV according to patient size and/or use of iterative reconstruction technique. COMPARISON:  07/14/2017. FINDINGS: Lower chest: Centrilobular emphsyema noted.  Dependent atelectasis noted in both lung bases, left greater than right. 8 mm right lower lobe pulmonary nodule on image 13/4 was 7 mm previously, compatible with benign etiology. Hepatobiliary: No suspicious focal abnormality in the liver on this study without intravenous contrast. There is no evidence for gallstones, gallbladder wall thickening, or pericholecystic fluid. No intrahepatic or extrahepatic biliary dilation. Pancreas: No focal mass lesion. No dilatation of the main duct. No intraparenchymal cyst. No peripancreatic edema. Spleen: No splenomegaly. No focal mass lesion. Adrenals/Urinary Tract: No adrenal nodule or mass. Stable cortical scarring interpolar right kidney with parenchymal calcification. 3-4 mm nonobstructing stone interpolar left kidney is new in the interval. No evidence for hydroureter. Bladder is distended. Stomach/Bowel: Stomach is markedly distended with food, fluid, and gas. There is some small bowel wall thickening in the left upper quadrant with decompressed small bowel in the right lower quadrant. Terminal ileum unremarkable. The appendix is not well visualized, but there is no edema or inflammation in the region of the cecum. Moderate to large stool volume in the ascending colon and hepatic flexure, otherwise unremarkable. Inspissated formed stool is seen in the distal transverse colon, splenic flexure, and descending colon. There is wall thickening in ill-defined colonic wall in the distal descending segment extending into the sigmoid colon where prominent lamellated formed stool is noted. There is pericolonic edema/inflammation in the sigmoid segment with scattered extraluminal gas in this region which appears to localize to the sigmoid colon in the region of the large formed stool volume (see axial image 58/2 and coronal 48/5). Distal sigmoid colon and rectum are largely free of stool. Low rectal anastomosis evident. Vascular/Lymphatic: There is moderate atherosclerotic  calcification of the abdominal aorta without aneurysm. There is no gastrohepatic or hepatoduodenal ligament lymphadenopathy. No retroperitoneal or mesenteric lymphadenopathy. No pelvic sidewall lymphadenopathy. Reproductive: The prostate gland and seminal vesicles are unremarkable. Other: Trace free fluid noted in the left paracolic gutter. Musculoskeletal: No worrisome lytic or sclerotic osseous abnormality. IMPRESSION: 1. Circumferential wall thickening in the distal descending and sigmoid colon with pericolonic edema/inflammation and scattered extraluminal gas in this region. Patient has a large amount of lamellated, inspissated formed stool in the splenic flexure, descending colon and most prominently in the sigmoid segment corresponding to the area of wall thickening and pericolonic edema/fluid/free air. Imaging features compatible with colonic perforation in the region of the sigmoid colon likely secondary to impacted stool. 2. Small bowel wall thickening with subtle ill definition of small-bowel in the left abdomen just cranial to the abnormal sigmoid colon. Appearance is felt to be  secondary to the sigmoid colon process. 3. Marked distention of the stomach with food, fluid, and gas. Imaging features are concerning for an associated component of gastric outlet obstruction/stricture at the level of the gastrojejunostomy. 4. 8 mm right lower lobe pulmonary nodule stable since prior and evaluated by lung cancer screening CT in 2020. Finding is compatible with benign disease. 5. 3-4 mm nonobstructing left renal stone. 6. Aortic Atherosclerosis (ICD10-I70.0) and Emphysema (ICD10-J43.9). I discussed these findings by telephone with Dr. Gustavus Messing at [phone] hours on 08/16/2022. Electronically Signed   By: Misty Stanley M.D.   On: 08/16/2022 07:06   DG Chest Port 1 View  Result Date: 08/16/2022 CLINICAL DATA:  Shortness of breath. EXAM: PORTABLE CHEST 1 VIEW COMPARISON:  07/27/2022 FINDINGS: 0523 hours. The lungs are  clear without focal pneumonia, edema, pneumothorax or pleural effusion. Interstitial markings are diffusely coarsened with chronic features. Streaky opacity at the lung bases suggest atelectasis. Bones are demineralized. Telemetry leads overlie the chest. IMPRESSION: Chronic interstitial changes with bibasilar atelectasis. Electronically Signed   By: Misty Stanley M.D.   On: 08/16/2022 05:45    Anti-infectives: Anti-infectives (From admission, onward)    Start     Dose/Rate Route Frequency Ordered Stop   08/16/22 1500  piperacillin-tazobactam (ZOSYN) IVPB 3.375 g        3.375 g 100 mL/hr over 30 Minutes Intravenous STAT 08/16/22 1446 08/16/22 2032   08/16/22 0615  piperacillin-tazobactam (ZOSYN) IVPB 3.375 g        3.375 g 100 mL/hr over 30 Minutes Intravenous  Once 08/16/22 0609 08/16/22 0713       Assessment/Plan: Problem List: Patient Active Problem List   Diagnosis Date Noted   Bowel perforation ([place]) 08/16/2022   Sepsis ([place]) 08/16/2022   Normocytic anemia 08/16/2022   COPD (chronic obstructive pulmonary disease) ([place]) 08/16/2022   Rheumatoid arthritis ([place]) 08/16/2022   Anxiety 08/16/2022   GERD (gastroesophageal reflux disease) 08/16/2022   Tobacco abuse 08/16/2022   Constipation 04/21/2020   Chronic neck and back pain 04/21/2020   Psychophysiological insomnia 04/21/2020   Opioid dependence on maintenance agonist therapy, no symptoms ([place]) 02/01/2019   Loss of weight 01/22/2013   Hypokalemia 01/22/2013   Substance abuse ([place]) 01/22/2013   Pancreatitis, chronic ([place]) 08/27/2012   Abdominal pain, epigastric 08/27/2012   Chest pain 01/26/2012   SOB (shortness of breath) 01/26/2012    Continue NG suction.   1 Day Post-Op    LOS: 1 day   Matt B. Hassell Done, MD, [facility] [phone] to reach the surgeon on call.    08/17/2022 9:46 AM

## 2022-08-17 NOTE — Progress Notes (Signed)
Patient with complaints of persistent hicccups this morning. Indicated that he has a history of hiccups that have lasted multiple days in the past and had to be given medication to help. Dayshift nurse present during conversation at handoff; plans to follow up.

## 2022-08-17 NOTE — Plan of Care (Signed)
  Problem: Education: Goal: Knowledge of General Education information will improve Description: Including pain rating scale, medication(s)/side effects and non-pharmacologic comfort measures Outcome: Progressing   Problem: Clinical Measurements: Goal: Ability to maintain clinical measurements within normal limits will improve Outcome: Progressing Goal: Will remain free from infection Outcome: Progressing   

## 2022-08-17 NOTE — Progress Notes (Addendum)
Pharmacy Antibiotic Note  Jerry Knight is a 61 y.o. male admitted on 08/16/2022 with abdominal pain. Zosyn given in ED on 08/16/22.  Pharmacy has been consulted on 08/17/22  for Zosyn dosing for intra-abdominal infection.    Plan: Zosyn 3.375g IV q8h (4 hour infusion). Monitor clinical status, renal function and culture results daily.    Height: 5' 5.5" (166.4 cm) Weight: 52.6 kg (116 lb) IBW/kg (Calculated) : 62.65  Temp (24hrs), Avg:98.1 F (36.7 C), Min:97.7 F (36.5 C), Max:98.7 F (37.1 C)  Recent Labs  Lab 08/16/22 0506 08/16/22 1820 08/16/22 2138 08/17/22 0048  WBC 22.4*  --   --   --   CREATININE 0.82  --   --  0.68  LATICACIDVEN 3.1* 3.8* 3.1*  --     Estimated Creatinine Clearance: 74 mL/min (by C-G formula based on SCr of 0.68 mg/dL).    Allergies  Allergen Reactions   Ambien [Zolpidem Tartrate] Other (See Comments)    Black out   Iohexol     Pt claims he has chest tightness and sob after iv contrast injections.   Lunesta [Eszopiclone] Other (See Comments)   Seroquel [Quetiapine Fumarate]     Blackout   Sonata [Zaleplon]     "black out'  confused    Antimicrobials this admission: Zosyn  9/15 x1; 9/16 >>   Dose adjustments this admission:   Microbiology results: 9/15 BCx: ngtd <24 hr    Thank you for allowing pharmacy to be a part of this patient's care.  Nicole Cella, RPh Clinical Pharmacist  08/17/2022 4:04 PM Please check AMION for all [place] phone numbers After 10:00 PM, call [place] [phone]

## 2022-08-17 NOTE — Progress Notes (Signed)
   08/16/22 2013  Assess: MEWS Score  Resp 17  SpO2 97 %  O2 Device Nasal Cannula  O2 Flow Rate (L/min) 2 L/min  Assess: MEWS Score  MEWS Temp 0  MEWS Systolic 0  MEWS Pulse 0  MEWS RR 0  MEWS LOC 2  MEWS Score 2  MEWS Score Color Yellow  Assess: if the MEWS score is Yellow or Red  Were vital signs taken at a resting state? Yes  Focused Assessment No change from prior assessment  Does the patient meet 2 or more of the SIRS criteria? Yes  Does the patient have a confirmed or suspected source of infection? No  MEWS guidelines implemented *See Row Information* No, previously yellow, continue vital signs every 4 hours  Treat  Pain Score 10  Take Vital Signs  Increase Vital Sign Frequency  Yellow: Q 2hr X 2 then Q 4hr X 2, if remains yellow, continue Q 4hrs  Escalate  MEWS: Escalate Yellow: discuss with charge nurse/RN and consider discussing with provider and RRT  Notify: Charge Nurse/RN  Name of Charge Nurse/RN Notified Josephine  Date Charge Nurse/RN Notified 08/16/22  Time Charge Nurse/RN Notified 2344  Notify: Provider  Provider Name/Title B.Kyere,NP  Date Provider Notified 08/16/22  Time Provider Notified 2344  Method of Notification Page  Notification Reason Other (Comment) (yellow MEWS)  Provider response Other (Comment)  Date of Provider Response 08/16/22  Time of Provider Response 2345  Document  Progress note created (see row info) Yes  Assess: SIRS CRITERIA  SIRS Temperature  0  SIRS Pulse 1  SIRS Respirations  0  SIRS WBC 1  SIRS Score Sum  2

## 2022-08-17 NOTE — Progress Notes (Signed)
14 mL of Dilaudid wasted with Joaquim Lai RN

## 2022-08-17 NOTE — Plan of Care (Signed)
  Problem: Nutrition: Goal: Adequate nutrition will be maintained Outcome: Progressing   Problem: Pain Managment: Goal: General experience of comfort will improve Outcome: Progressing   Problem: Safety: Goal: Ability to remain free from injury will improve Outcome: Progressing   

## 2022-08-18 DIAGNOSIS — K631 Perforation of intestine (nontraumatic): Secondary | ICD-10-CM | POA: Diagnosis not present

## 2022-08-18 LAB — CBC
HCT: 29.1 % — ABNORMAL LOW (ref 39.0–52.0)
Hemoglobin: 10.1 g/dL — ABNORMAL LOW (ref 13.0–17.0)
MCH: 31.8 pg (ref 26.0–34.0)
MCHC: 34.7 g/dL (ref 30.0–36.0)
MCV: 91.5 fL (ref 80.0–100.0)
Platelets: 158 10*3/uL (ref 150–400)
RBC: 3.18 MIL/uL — ABNORMAL LOW (ref 4.22–5.81)
RDW: 14.1 % (ref 11.5–15.5)
WBC: 17.5 10*3/uL — ABNORMAL HIGH (ref 4.0–10.5)
nRBC: 0 % (ref 0.0–0.2)

## 2022-08-18 LAB — BASIC METABOLIC PANEL
Anion gap: 7 (ref 5–15)
BUN: 10 mg/dL (ref 6–20)
CO2: 24 mmol/L (ref 22–32)
Calcium: 7.8 mg/dL — ABNORMAL LOW (ref 8.9–10.3)
Chloride: 105 mmol/L (ref 98–111)
Creatinine, Ser: 0.7 mg/dL (ref 0.61–1.24)
GFR, Estimated: >60 mL/min (ref >60)
Glucose, Bld: 99 mg/dL (ref 70–99)
Potassium: 3.9 mmol/L (ref 3.5–5.1)
Sodium: 136 mmol/L (ref 135–145)

## 2022-08-18 MED ORDER — CHLORHEXIDINE GLUCONATE CLOTH 2 % EX PADS
6.0000 | MEDICATED_PAD | Freq: Every day | CUTANEOUS | Status: DC
  Administered 2022-08-18 – 2022-09-11 (×25): 6 via TOPICAL

## 2022-08-18 MED ORDER — PHENOL 1.4 % MT LIQD
1.0000 | OROMUCOSAL | Status: DC | PRN
  Filled 2022-08-18: qty 177

## 2022-08-18 MED ORDER — SODIUM CHLORIDE 0.9 % IV SOLN
12.5000 mg | Freq: Four times a day (QID) | INTRAVENOUS | Status: AC | PRN
  Administered 2022-08-18 – 2022-08-19 (×3): 12.5 mg via INTRAVENOUS
  Filled 2022-08-18 (×3): qty 0.5

## 2022-08-18 NOTE — Plan of Care (Signed)
  Problem: Safety: Goal: Ability to remain free from injury will improve Outcome: Progressing   Problem: Nutrition: Goal: Adequate nutrition will be maintained Outcome: Not Progressing   Problem: Pain Managment: Goal: General experience of comfort will improve Outcome: Not Progressing

## 2022-08-18 NOTE — Progress Notes (Signed)
Dressing change done

## 2022-08-18 NOTE — Progress Notes (Signed)
Patient ID: Jerry Knight, male   DOB: [birth year], 61 y.o.   MRN: [id number] [facility] Progress Note:   2 Days Post-Op   THE PLAN  Thorazine for hiccoughs Keep NG to suction and let postop ileus resolve  Subjective: Mental status is clear.  Complaints pain requiring change in pain meds.  He has developed hiccoughs. Objective: Vital signs in last 24 hours: Temp:  [98 F (36.7 C)-98.6 F (37 C)] 98.3 F (36.8 C) (09/17 0755) Pulse Rate:  [89-105] 99 (09/17 0755) Resp:  [10-19] 13 (09/17 0755) BP: (108-135)/(74-94) 135/94 (09/17 0755) SpO2:  [92 %-96 %] 95 % (09/17 0755) FiO2 (%):  [36 %] 36 % (09/16 1735)  Intake/Output from previous day: 09/16 0701 - 09/17 0700 In: 0  Out: 4545 [Urine:3325; Emesis/NG output:1120; Drains:100] Intake/Output this shift: No intake/output data recorded.  Physical Exam: Work of breathing is normal.  Ostomy is pink and edematous but no flatus or stool.  Wound is packed.    Lab Results:  Results for orders placed or performed during the hospital encounter of 08/16/22 (from the past 48 hour(s))  Type and screen [place] [facility] this morning.     Status: None   Collection Time: 08/16/22 11:35 AM  Result Value Ref Range   ABO/RH(D) O NEG    Antibody Screen NEG    Sample Expiration      08/19/2022,2359 Performed at [facility], [place] [address], [place], [place] [address]   ABO/Rh     Status: None   Collection Time: 08/16/22 11:40 AM  Result Value Ref Range   ABO/RH(D)      O NEG Performed at [facility] [address], [place], [place] [address]   Lactic acid, plasma     Status: Abnormal   Collection Time: 08/16/22  6:20 PM  Result Value Ref Range   Lactic Acid, Venous 3.8 (HH) 0.5 - 1.9 mmol/L    Comment: CRITICAL VALUE NOTED. VALUE IS CONSISTENT WITH PREVIOUSLY REPORTED/CALLED VALUE Performed at [facility], [place] [address], [place], [place] [address]   Lactic acid, plasma      Status: Abnormal   Collection Time: 08/16/22  9:38 PM  Result Value Ref Range   Lactic Acid, Venous 3.1 (HH) 0.5 - 1.9 mmol/L    Comment: CRITICAL VALUE NOTED. VALUE IS CONSISTENT WITH PREVIOUSLY REPORTED/CALLED VALUE Performed at [facility], [place] [address], [place], [place] [address]   HIV Antibody (routine testing w rflx)     Status: None   Collection Time: 08/17/22 12:48 AM  Result Value Ref Range   HIV Screen 4th Generation wRfx Non Reactive Non Reactive    Comment: Performed at [facility], [place] [address], [place],  [address]  Comprehensive metabolic panel     Status: Abnormal   Collection Time: 08/17/22 12:48 AM  Result Value Ref Range   Sodium 136 135 - 145 mmol/L   Potassium 3.6 3.5 - 5.1 mmol/L   Chloride 105 98 - 111 mmol/L   CO2 21 (L) 22 - 32 mmol/L   Glucose, Bld 119 (H) 70 - 99 mg/dL    Comment: Glucose reference range applies only to samples taken after fasting for at least 8 hours.   BUN 10 6 - 20 mg/dL   Creatinine, Ser 0.68 0.61 - 1.24 mg/dL   Calcium 8.0 (L) 8.9 - 10.3 mg/dL   Total Protein 5.0 (L) 6.5 - 8.1 g/dL  Albumin 2.4 (L) 3.5 - 5.0 g/dL   AST 19 15 - 41 U/L   ALT 12 0 - 44 U/L   Alkaline Phosphatase 45 38 - 126 U/L   Total Bilirubin 1.3 (H) 0.3 - 1.2 mg/dL   GFR, Estimated >60 >60 mL/min    Comment: (NOTE) Calculated using the CKD-EPI Creatinine Equation (2021)    Anion gap 10 5 - 15    Comment: Performed at [facility] [address], [place], [place] [address]  Magnesium     Status: Abnormal   Collection Time: 08/17/22 12:48 AM  Result Value Ref Range   Magnesium 1.2 (L) 1.7 - 2.4 mg/dL    Comment: Performed at [facility] [address] [place], [place] [address]  CBC     Status: Abnormal   Collection Time: 08/18/22 12:17 AM  Result Value Ref Range   WBC 17.5 (H) 4.0 - 10.5 K/uL   RBC 3.18 (L) 4.22 - 5.81 MIL/uL   Hemoglobin 10.1 (L) 13.0 - 17.0 g/dL   HCT 29.1 (L) 39.0 - 52.0 %   MCV 91.5 80.0 -  100.0 fL   MCH 31.8 26.0 - 34.0 pg   MCHC 34.7 30.0 - 36.0 g/dL   RDW 14.1 11.5 - 15.5 %   Platelets 158 150 - 400 K/uL   nRBC 0.0 0.0 - 0.2 %    Comment: Performed at [facility], [place] [address], [place], [place] [address]  Basic metabolic panel     Status: Abnormal   Collection Time: 08/18/22 12:17 AM  Result Value Ref Range   Sodium 136 135 - 145 mmol/L   Potassium 3.9 3.5 - 5.1 mmol/L   Chloride 105 98 - 111 mmol/L   CO2 24 22 - 32 mmol/L   Glucose, Bld 99 70 - 99 mg/dL    Comment: Glucose reference range applies only to samples taken after fasting for at least 8 hours.   BUN 10 6 - 20 mg/dL   Creatinine, Ser 0.70 0.61 - 1.24 mg/dL   Calcium 7.8 (L) 8.9 - 10.3 mg/dL   GFR, Estimated >60 >60 mL/min    Comment: (NOTE) Calculated using the CKD-EPI Creatinine Equation (2021)    Anion gap 7 5 - 15    Comment: Performed at [facility] [address], [place], [place] [address]    Radiology/Results: No results found.  Anti-infectives: Anti-infectives (From admission, onward)    Start     Dose/Rate Route Frequency Ordered Stop   08/17/22 1630  piperacillin-tazobactam (ZOSYN) IVPB 3.375 g        3.375 g 12.5 mL/hr over 240 Minutes Intravenous Every 8 hours 08/17/22 1540     08/16/22 1500  piperacillin-tazobactam (ZOSYN) IVPB 3.375 g        3.375 g 100 mL/hr over 30 Minutes Intravenous STAT 08/16/22 1446 08/16/22 2032   08/16/22 0615  piperacillin-tazobactam (ZOSYN) IVPB 3.375 g        3.375 g 100 mL/hr over 30 Minutes Intravenous  Once 08/16/22 0609 08/16/22 0713       Assessment/Plan: Problem List: Patient Active Problem List   Diagnosis Date Noted   Bowel perforation ([place]) 08/16/2022   Sepsis ([place]) 08/16/2022   Normocytic anemia 08/16/2022   COPD (chronic obstructive pulmonary disease) ([place]) 08/16/2022   Rheumatoid arthritis ([place]) 08/16/2022   Anxiety 08/16/2022   GERD (gastroesophageal reflux disease) 08/16/2022   Tobacco abuse 08/16/2022    Constipation 04/21/2020   Chronic  neck and back pain 04/21/2020   Psychophysiological insomnia 04/21/2020   Opioid dependence on maintenance agonist therapy, no symptoms ([place]) 02/01/2019   Loss of weight 01/22/2013   Hypokalemia 01/22/2013   Substance abuse ([place]) 01/22/2013   Pancreatitis, chronic ([place]) 08/27/2012   Abdominal pain, epigastric 08/27/2012   Chest pain 01/26/2012   SOB (shortness of breath) 01/26/2012    Ileus post [place] procedure.  Hiccoughs --will give thorazine.   2 Days Post-Op    LOS: 2 days   Matt B. Hassell Done, MD, [facility], [facility] [phone] to reach the surgeon on call.    08/18/2022 8:35 AM

## 2022-08-18 NOTE — Progress Notes (Signed)
PROGRESS NOTE    VARDAAN Knight  VQQ:[id number] DOB: [birth year] DOA: 08/16/2022 PCP: Beverley Fiedler, FNP   Brief Narrative:  Jerry Knight is a 61 y.o. male with medical history significant of  COPD, chronic pancreatitis, rheumatoid arthritis on Enbrel, history of seizures not on an AED, chronic pain on Suboxone, polysubstance abuse, Zollinger-Ellison syndrome, and constipation who presented with complaints of  lower abdominal pain. Imaging remarkable for colonic perforation/obstruction - transferred to main campus for surgical intervention. Hospitalist called for consult.   Assessment & Plan:   Principal Problem:   Bowel perforation (HCC) Active Problems:   Sepsis ([place])   Normocytic anemia   Hypokalemia   COPD (chronic obstructive pulmonary disease) (HCC)   Rheumatoid arthritis (HCC)   Anxiety   GERD (gastroesophageal reflux disease)   Chronic neck and back pain   Tobacco abuse  Sepsis secondary to bowel perforation secondary to stercoral ulcer Exploratory laparotomy, Left colostomy and partial sigmoid colectomy (08/16/22) -Tachycardic and tachypneic with initial WBC 22.4 and lactic acid 3.1.   -CT revealed colonic perforation in the region of the sigmoid colon thought secondary to impacted stool.  -Continue Zosyn -Surgery as above 08/16/22 - tolerated well -Currently NPO with ostomy, NG to intermittent suction and morphine PCA pump per GenSx - appreciate insight/recs   Normocytic anemia Likely chronic anemia of chronic disease given his history/comorbidities Post op/hemodilutional - follow repeat labs   Hypokalemia, resolved -Continue IVF/supplement while NPO perioperatively   COPD, without exacerbation Chest x-ray noted chronic interstitial lung changes with bibasilar atelectasis. -Albuterol nebs as needed   Rheumatoid arthritis Patient had been on Enbrel 50 mg and methotrexate 10 mg on Saturdays. -Hold current medication regimen given above   Anxiety Home  medication regimen include amitriptyline '100mg'$  HS, Ativan 1 mg TID, and mirtazapine '45mg'$  HS. -Ativan 0.5 mg every 4 hours as needed for anxiety until PO route established   GERD -Protonix 40 mg IV twice daily   Chronic pain  Patient has been on Suboxone. Continue dilaudid PCA per surgery   Tobacco abuse Patient reportedly smokes 2 packs of cigarettes per day on average. -Nicotine patch offered   DVT prophylaxis: SCDs Advance Care Planning:   Code Status: Full Code  Consults: General surgery  Status is: inpt  Dispo: The patient is from: home              Anticipated d/c is to: TBD              Anticipated d/c date is: 72+h              Patient currently NOT medically stable for discharge  Consultants:  Gen Sx  Procedures:  Exploratory laparotomy, Left colostomy and partial sigmoid colectomy (08/16/22)  Antimicrobials:  Continue e zosyn for now and consider narrowing based on cultures.  Subjective: No acute issues/events overnight - pain moderately well controlled on current regimen, only complaint today is of hiccups  Objective: Vitals:   08/18/22 0344 08/18/22 0446 08/18/22 0740 08/18/22 0755  BP:  128/86  (!) 135/94  Pulse:  89  99  Resp: '13 10 15 13  '$ Temp:  98.2 F (36.8 C)  98.3 F (36.8 C)  TempSrc:  Oral  Oral  SpO2: 92% 94% 93% 95%  Weight:      Height:        Intake/Output Summary (Last 24 hours) at 08/18/2022 0825 Last data filed at 08/18/2022 0700 Gross per 24 hour  Intake 0 ml  Output 4545  ml  Net -4545 ml    Filed Weights   08/16/22 0501  Weight: 52.6 kg    Examination:  General exam: Appears calm and comfortable  HEENT: NG intact - bilious liquid draining Respiratory system: Clear to auscultation. Respiratory effort normal. Cardiovascular system: S1 & S2 heard, RRR. No JVD, murmurs, rubs, gallops or clicks. No pedal edema. Gastrointestinal system: diffusely tender, non-distended, ostomy intact Central nervous system: Alert and oriented.  No focal neurological deficits. Extremities: Symmetric 5 x 5 power. Skin: No rashes, lesions or ulcers  Data Reviewed: I have personally reviewed following labs and imaging studies  CBC: Recent Labs  Lab 08/16/22 0506 08/18/22 0017  WBC 22.4* 17.5*  NEUTROABS 19.7*  --   HGB 12.6* 10.1*  HCT 38.0* 29.1*  MCV 92.5 91.5  PLT 235 465    Basic Metabolic Panel: Recent Labs  Lab 08/16/22 0506 08/17/22 0048 08/18/22 0017  NA 136 136 136  K 3.4* 3.6 3.9  CL 105 105 105  CO2 24 21* 24  GLUCOSE 176* 119* 99  BUN '11 10 10  '$ CREATININE 0.82 0.68 0.70  CALCIUM 8.5* 8.0* 7.8*  MG  --  1.2*  --     GFR: Estimated Creatinine Clearance: 74 mL/min (by C-G formula based on SCr of 0.7 mg/dL). Liver Function Tests: Recent Labs  Lab 08/16/22 0506 08/17/22 0048  AST 24 19  ALT 11 12  ALKPHOS 94 45  BILITOT 0.7 1.3*  PROT 7.3 5.0*  ALBUMIN 3.4* 2.4*    Recent Labs  Lab 08/16/22 0506  LIPASE 39    No results for input(s): "AMMONIA" in the last 168 hours. Coagulation Profile: No results for input(s): "INR", "PROTIME" in the last 168 hours. Cardiac Enzymes: No results for input(s): "CKTOTAL", "CKMB", "CKMBINDEX", "TROPONINI" in the last 168 hours. BNP (last 3 results) No results for input(s): "PROBNP" in the last 8760 hours. HbA1C: No results for input(s): "HGBA1C" in the last 72 hours. CBG: No results for input(s): "GLUCAP" in the last 168 hours. Lipid Profile: No results for input(s): "CHOL", "HDL", "LDLCALC", "TRIG", "CHOLHDL", "LDLDIRECT" in the last 72 hours. Thyroid Function Tests: No results for input(s): "TSH", "T4TOTAL", "FREET4", "T3FREE", "THYROIDAB" in the last 72 hours. Anemia Panel: No results for input(s): "VITAMINB12", "FOLATE", "FERRITIN", "TIBC", "IRON", "RETICCTPCT" in the last 72 hours. Sepsis Labs: Recent Labs  Lab 08/16/22 0506 08/16/22 1820 08/16/22 2138  LATICACIDVEN 3.1* 3.8* 3.1*     Recent Results (from the past 240 hour(s))   Culture, blood (routine x 2)     Status: None (Preliminary result)   Collection Time: 08/16/22  6:55 AM   Specimen: BLOOD  Result Value Ref Range Status   Specimen Description   Final    BLOOD RIGHT ANTECUBITAL Performed at [facility], [place] [place], [place] [address]    Special Requests   Final    Blood Culture results may not be optimal due to an inadequate volume of blood received in culture bottles [place] Performed at [facility], [place] [place], [place] [address]    Culture   Final    NO GROWTH < 24 HOURS Performed at [facility], [place] [address], [place], [place] [address]    Report Status PENDING  Incomplete  Culture, blood (routine x 2)     Status: None (Preliminary result)   Collection Time: 08/16/22  7:08 AM   Specimen: BLOOD RIGHT HAND  Result  Value Ref Range Status   Specimen Description   Final    BLOOD RIGHT HAND Performed at [facility], [place] [address] [place], [place] [address]    Special Requests   Final    BOTTLES DRAWN AEROBIC AND ANAEROBIC Blood Culture adequate volume Performed at [facility], [place] [place], [place] [address]    Culture   Final    NO GROWTH < 24 HOURS Performed at [facility], [place] [address] [place], [place] [address]    Report Status PENDING  Incomplete         Radiology Studies: No results found.  Scheduled Meds:  Chlorhexidine Gluconate Cloth  6 each Topical Daily   enoxaparin (LOVENOX) injection  40 mg Subcutaneous Q24H   morphine   Intravenous Q4H   nicotine  21 mg Transdermal QHS   pantoprazole (PROTONIX) IV  40 mg Intravenous Q12H   sodium chloride flush  3 mL Intravenous Q12H   Continuous Infusions:  0.45 % NaCl with KCl 20 mEq / L 75 mL/hr at 08/18/22 0034   methocarbamol (ROBAXIN) IV 500 mg (08/17/22 1439)   piperacillin-tazobactam (ZOSYN)  IV 3.375 g (08/18/22 8756)     LOS: 2 days   Time  spent: 24mn  Jamonta Goerner C Renold Kozar, DO [facility]  If 7PM-7AM, please contact night-coverage [email or web address]  08/18/2022, 8:25 AM

## 2022-08-19 ENCOUNTER — Encounter (HOSPITAL_COMMUNITY): Payer: Self-pay | Admitting: General Surgery

## 2022-08-19 DIAGNOSIS — K631 Perforation of intestine (nontraumatic): Secondary | ICD-10-CM | POA: Diagnosis not present

## 2022-08-19 LAB — BASIC METABOLIC PANEL
Anion gap: 11 (ref 5–15)
BUN: 8 mg/dL (ref 6–20)
CO2: 21 mmol/L — ABNORMAL LOW (ref 22–32)
Calcium: 7.6 mg/dL — ABNORMAL LOW (ref 8.9–10.3)
Chloride: 100 mmol/L (ref 98–111)
Creatinine, Ser: 0.76 mg/dL (ref 0.61–1.24)
GFR, Estimated: >60 mL/min (ref >60)
Glucose, Bld: 68 mg/dL — ABNORMAL LOW (ref 70–99)
Potassium: 3.7 mmol/L (ref 3.5–5.1)
Sodium: 132 mmol/L — ABNORMAL LOW (ref 135–145)

## 2022-08-19 LAB — CBC
HCT: 29.2 % — ABNORMAL LOW (ref 39.0–52.0)
Hemoglobin: 10.2 g/dL — ABNORMAL LOW (ref 13.0–17.0)
MCH: 31.6 pg (ref 26.0–34.0)
MCHC: 34.9 g/dL (ref 30.0–36.0)
MCV: 90.4 fL (ref 80.0–100.0)
Platelets: 173 10*3/uL (ref 150–400)
RBC: 3.23 MIL/uL — ABNORMAL LOW (ref 4.22–5.81)
RDW: 13.7 % (ref 11.5–15.5)
WBC: 14.2 10*3/uL — ABNORMAL HIGH (ref 4.0–10.5)
nRBC: 0 % (ref 0.0–0.2)

## 2022-08-19 LAB — SURGICAL PATHOLOGY

## 2022-08-19 MED ORDER — KCL IN DEXTROSE-NACL 20-5-0.45 MEQ/L-%-% IV SOLN
INTRAVENOUS | Status: AC
  Filled 2022-08-19 (×2): qty 1000

## 2022-08-19 MED ORDER — OXYCODONE HCL 5 MG PO TABS
10.0000 mg | ORAL_TABLET | ORAL | Status: DC | PRN
  Administered 2022-08-20 – 2022-08-21 (×3): 10 mg via ORAL
  Filled 2022-08-19 (×3): qty 2

## 2022-08-19 MED ORDER — CYCLOBENZAPRINE HCL 10 MG PO TABS
10.0000 mg | ORAL_TABLET | Freq: Three times a day (TID) | ORAL | Status: DC
  Administered 2022-08-19 – 2022-08-20 (×6): 10 mg via ORAL
  Filled 2022-08-19 (×6): qty 1

## 2022-08-19 NOTE — Care Management Important Message (Signed)
Important Message  Patient Details  Name: Jerry Knight MRN: [id number] Date of Birth: Feb 10, 1961   Medicare Important Message Given:  Yes     Hannah Beat 08/19/2022, 11:07 AM

## 2022-08-19 NOTE — Consult Note (Signed)
[place] Nurse ostomy consult note Wife Marcie Bal  is in room and we perform pouch change.  She will be assisting at home. Patient states he had a rough night with hallucinations but is more oriented today.   Stoma type/location:  LLQ colostomy.  Midline abdominal incision. Stomal assessment/size: 1 3/4" pink edematous and moist  no stool or flatus noted today.  Peristomal assessment: intact  midline incision present in close proximity to stoma Treatment options for stomal/peristomal skin:  barrier ring.  Trim edge of adhesive barrier   2 piece 2 3/4" pouch Output none at this time.  Ostomy pouching: 2pc 2 3/4" pouch with barrier ring.  Education provided: pouch change performed with wife and patient observing.  Discuss twice weekly pouch changes, showering, switching to a filtered pouch after discharge and rationale. I demonstrate how to burp this bag if it fills with flatus.  We discuss emptying when 1/3 full.  Wife able to demonstrate roll closure.   Enrolled patient in [facility] program: Yes will today.  Written materials provided.   Will follow.  Domenic Moras MSN, RN, FNP-BC CWON Wound, Ostomy, Continence Nurse Pager [phone]

## 2022-08-19 NOTE — Progress Notes (Signed)
Progress Note  3 Days Post-Op  Subjective: NGT came out overnight. Not nauseas or vomiting this am. No bowel function yet. IV had to be replaced and PCA has not been able to be restarted yet. Tachy this am. Wife is bedside  Objective: Vital signs in last 24 hours: Temp:  [98.2 F (36.8 C)-99.1 F (37.3 C)] 98.5 F (36.9 C) (09/18 0751) Pulse Rate:  [99-109] 109 (09/18 0751) Resp:  [14-20] 18 (09/18 0751) BP: (127-139)/(79-85) 139/82 (09/18 0751) SpO2:  [91 %-99 %] 98 % (09/18 0751) FiO2 (%):  [36 %] 36 % (09/18 0347)    Intake/Output from previous day: 09/17 0701 - 09/18 0700 In: 0  Out: 2530 [Urine:2100; Emesis/NG output:400; Drains:30] Intake/Output this shift: No intake/output data recorded.  PE: General: pleasant, WD, male who is laying in bed in NAD HEENT: head is normocephalic, atraumatic.  Sclera are noninjected.  Pupils equal and round. EOMs intact.  Ears and nose without any masses or lesions.  Mouth is pink and moist Heart: Palpable radial pulses bilaterally Lungs: Respiratory effort nonlabored Abd: soft, ND, colostomy with thin SS fluid. Drain with SS output. Midline wound as below MSK: all 4 extremities are symmetrical with no cyanosis, clubbing, or edema. Skin: warm and dry Psych: A&Ox3 with an appropriate affect.      Lab Results:  Recent Labs    08/18/22 0017 08/19/22 0035  WBC 17.5* 14.2*  HGB 10.1* 10.2*  HCT 29.1* 29.2*  PLT 158 173   BMET Recent Labs    08/18/22 0017 08/19/22 0035  NA 136 132*  K 3.9 3.7  CL 105 100  CO2 24 21*  GLUCOSE 99 68*  BUN 10 8  CREATININE 0.70 0.76  CALCIUM 7.8* 7.6*   PT/INR No results for input(s): "LABPROT", "INR" in the last 72 hours. CMP     Component Value Date/Time   NA 132 (L) 08/19/2022 0035   NA 140 10/12/2020 1526   K 3.7 08/19/2022 0035   CL 100 08/19/2022 0035   CO2 21 (L) 08/19/2022 0035   GLUCOSE 68 (L) 08/19/2022 0035   BUN 8 08/19/2022 0035   BUN 10 10/12/2020 1526    CREATININE 0.76 08/19/2022 0035   CREATININE 0.77 01/27/2013 1105   CALCIUM 7.6 (L) 08/19/2022 0035   PROT 5.0 (L) 08/17/2022 0048   PROT 7.5 10/12/2020 1526   ALBUMIN 2.4 (L) 08/17/2022 0048   ALBUMIN 4.5 10/12/2020 1526   AST 19 08/17/2022 0048   ALT 12 08/17/2022 0048   ALKPHOS 45 08/17/2022 0048   BILITOT 1.3 (H) 08/17/2022 0048   BILITOT 0.3 10/12/2020 1526   GFRNONAA >60 08/19/2022 0035   GFRAA 123 10/12/2020 1526   Lipase     Component Value Date/Time   LIPASE 39 08/16/2022 0506       Studies/Results: No results found.  Anti-infectives: Anti-infectives (From admission, onward)    Start     Dose/Rate Route Frequency Ordered Stop   08/17/22 1630  piperacillin-tazobactam (ZOSYN) IVPB 3.375 g        3.375 g 12.5 mL/hr over 240 Minutes Intravenous Every 8 hours 08/17/22 1540     08/16/22 1500  piperacillin-tazobactam (ZOSYN) IVPB 3.375 g        3.375 g 100 mL/hr over 30 Minutes Intravenous STAT 08/16/22 1446 08/16/22 2032   08/16/22 0615  piperacillin-tazobactam (ZOSYN) IVPB 3.375 g        3.375 g 100 mL/hr over 30 Minutes Intravenous  Once 08/16/22 0609 08/16/22 9563  Assessment/Plan Perforated stercoral ulcer POD 3 ex lap partial sigmoid colectomy, colostomy - poor pain control this am as PCA was not working. Continue PCA. Baseline flexeril added by primary - NGT overnight - no n/v this am. Replace NGT for nausea/emesis - continue NPO and await ROBF - drain SS - continue - WTD dressing changes to midline wound - WOC RN following for new ostomy - encourage ambulation and IS use  FEN - NPO/IVFs VTE - SCDs, lovenox ID - zosyn Foley - d/c this am   Tobacco abuse/COPD - 2 ppd H/O substance abuse H/O opioid dependence - now on suboxone prn H/O seizure d/o Chronic pancreatitis Zollinger-Ellison syndrome GERD Hypoalbuminemic - appears malnourished at baseline due to muscle wasting RA - mtx and enbrel held   LOS: 3 days   Winferd Humphrey,  [facility] 08/19/2022, 8:19 AM Please see Amion for pager number during day hours 7:00am-4:30pm

## 2022-08-19 NOTE — Progress Notes (Signed)
Patient  was angry that his PCA pump was beeping and no one responded  and he pulled his NJ tube out.and refusing to put it back,provided notified and orders given to leave it and to put it back  when patient is nauseated,vomiting,more distended,will continue to monitor.

## 2022-08-19 NOTE — Progress Notes (Addendum)
PROGRESS NOTE    Jerry Knight  DJT:[id number] DOB: [birth year] DOA: 08/16/2022 PCP: Beverley Fiedler, FNP   Brief Narrative:  Jerry Knight is a 61 y.o. male with medical history significant of  COPD, chronic pancreatitis, rheumatoid arthritis on Enbrel, history of seizures not on an AED, chronic pain on Suboxone, polysubstance abuse, Zollinger-Ellison syndrome, and constipation who presented with complaints of  lower abdominal pain. Imaging remarkable for colonic perforation/obstruction - transferred to main campus for surgical intervention. Hospitalist called for consult.  **UPDATE** - PICC team not available and there is currently no IV access for this patient. Attempting to get PIV in for now and PICC placed in am - start oxycodone IR overnight - if PCA pump resumed this medication will need to be held. He can have either the PCA pump or oxy IR but not both.  Assessment & Plan:   Principal Problem:   Bowel perforation (HCC) Active Problems:   Sepsis ([place])   Normocytic anemia   Hypokalemia   COPD (chronic obstructive pulmonary disease) (HCC)   Rheumatoid arthritis (HCC)   Anxiety   GERD (gastroesophageal reflux disease)   Chronic neck and back pain   Tobacco abuse  Sepsis secondary to bowel perforation secondary to stercoral ulcer Exploratory laparotomy, Left colostomy and partial sigmoid colectomy (08/16/22) -Tachycardic and tachypneic with initial WBC 22.4 and lactic acid 3.1 - sepsis criteria resolved -CT revealed colonic perforation in the region of the sigmoid colon thought secondary to impacted stool.  -Continue Zosyn -Surgery as above 08/16/22 - tolerated well -Currently NPO with ostomy, NG accidentally removed overnight, will hold off on replacement given improvement, hopefully patient's bowels begin to return to function over the next few days, will need to consider TPN or alternate nutrition in the next 24 to 48 hours, borderline hypoglycemia on morning labs -  transition to D5 1/2NS+K in the interim.  Defer to [facility] for PO intake pending bowel recovery.   Normocytic anemia Likely chronic anemia of chronic disease given his history/comorbidities Post op/hemodilutional - follow repeat labs   Hypokalemia, resolved -Continue IVF/supplement while NPO perioperatively   COPD, without exacerbation Chest x-ray noted chronic interstitial lung changes with bibasilar atelectasis. -Albuterol nebs as needed   Rheumatoid arthritis Patient had been on Enbrel 50 mg and methotrexate 10 mg on Saturdays. -Hold current medication regimen given above   Anxiety Home medication regimen include amitriptyline '100mg'$  HS, Ativan 1 mg TID, and mirtazapine '45mg'$  HS. -Ativan 0.5 mg every 4 hours as needed for anxiety until PO route established   GERD -Protonix 40 mg IV twice daily   Chronic pain  Patient has been on Suboxone. Continue dilaudid PCA per surgery   Tobacco abuse Patient reportedly smokes 2 packs of cigarettes per day on average. -Nicotine patch offered   DVT prophylaxis: SCDs Advance Care Planning:   Code Status: Full Code  Consults: General surgery  Status is: inpt  Dispo: The patient is from: home              Anticipated d/c is to: TBD              Anticipated d/c date is: 72+h              Patient currently NOT medically stable for discharge  Consultants:  Gen Sx  Procedures:  Exploratory laparotomy, Left colostomy and partial sigmoid colectomy (08/16/22)  Antimicrobials:  Continue e zosyn for now and consider narrowing based on cultures.  Subjective: NG accidentally removed overnight, IV  dysfunction limiting PCA pump - resolved this am. No other events/issues noted.  Abdominal pain improving, denies nausea vomiting flatus or bowel movement.  Objective: Vitals:   08/19/22 0025 08/19/22 0347 08/19/22 0350 08/19/22 0454  BP:   133/85 135/79  Pulse:   (!) 105 99  Resp: '15 20 20   '$ Temp:   98.9 F (37.2 C) 98.8 F (37.1 C)   TempSrc:   Oral Oral  SpO2: 95% 99% 98% 98%  Weight:      Height:        Intake/Output Summary (Last 24 hours) at 08/19/2022 0747 Last data filed at 08/19/2022 0645 Gross per 24 hour  Intake 0 ml  Output 2530 ml  Net -2530 ml    Filed Weights   08/16/22 0501  Weight: 52.6 kg    Examination:  General: Generally malnourished/cachectic gentleman pleasantly resting in bed, No acute distress. HEENT:  Normocephalic atraumatic.  Sclerae nonicteric, noninjected.  Extraocular movements intact bilaterally. Neck:  Without mass or deformity.  Trachea is midline. Lungs:  Clear to auscultate bilaterally without rhonchi, wheeze, or rales. Heart: Tachycardic without murmurs rubs or gallops. Abdomen: Postop bandage clean dry intact, ostomy intact without any output or gas. Extremities: Without cyanosis, clubbing, edema, or obvious deformity.   Data Reviewed: I have personally reviewed following labs and imaging studies  CBC: Recent Labs  Lab 08/16/22 0506 08/18/22 0017 08/19/22 0035  WBC 22.4* 17.5* 14.2*  NEUTROABS 19.7*  --   --   HGB 12.6* 10.1* 10.2*  HCT 38.0* 29.1* 29.2*  MCV 92.5 91.5 90.4  PLT 235 158 696    Basic Metabolic Panel: Recent Labs  Lab 08/16/22 0506 08/17/22 0048 08/18/22 0017 08/19/22 0035  NA 136 136 136 132*  K 3.4* 3.6 3.9 3.7  CL 105 105 105 100  CO2 24 21* 24 21*  GLUCOSE 176* 119* 99 68*  BUN '11 10 10 8  '$ CREATININE 0.82 0.68 0.70 0.76  CALCIUM 8.5* 8.0* 7.8* 7.6*  MG  --  1.2*  --   --     GFR: Estimated Creatinine Clearance: 74 mL/min (by C-G formula based on SCr of 0.76 mg/dL). Liver Function Tests: Recent Labs  Lab 08/16/22 0506 08/17/22 0048  AST 24 19  ALT 11 12  ALKPHOS 94 45  BILITOT 0.7 1.3*  PROT 7.3 5.0*  ALBUMIN 3.4* 2.4*    Recent Labs  Lab 08/16/22 0506  LIPASE 39    No results for input(s): "AMMONIA" in the last 168 hours. Coagulation Profile: No results for input(s): "INR", "PROTIME" in the last 168  hours. Cardiac Enzymes: No results for input(s): "CKTOTAL", "CKMB", "CKMBINDEX", "TROPONINI" in the last 168 hours. BNP (last 3 results) No results for input(s): "PROBNP" in the last 8760 hours. HbA1C: No results for input(s): "HGBA1C" in the last 72 hours. CBG: No results for input(s): "GLUCAP" in the last 168 hours. Lipid Profile: No results for input(s): "CHOL", "HDL", "LDLCALC", "TRIG", "CHOLHDL", "LDLDIRECT" in the last 72 hours. Thyroid Function Tests: No results for input(s): "TSH", "T4TOTAL", "FREET4", "T3FREE", "THYROIDAB" in the last 72 hours. Anemia Panel: No results for input(s): "VITAMINB12", "FOLATE", "FERRITIN", "TIBC", "IRON", "RETICCTPCT" in the last 72 hours. Sepsis Labs: Recent Labs  Lab 08/16/22 0506 08/16/22 1820 08/16/22 2138  LATICACIDVEN 3.1* 3.8* 3.1*     Recent Results (from the past 240 hour(s))  Culture, blood (routine x 2)     Status: None (Preliminary result)   Collection Time: 08/16/22  6:55 AM   Specimen:  BLOOD  Result Value Ref Range Status   Specimen Description   Final    BLOOD RIGHT ANTECUBITAL Performed at [facility], [address] [place], [place] [address]    Special Requests   Final    Blood Culture results may not be optimal due to an inadequate volume of blood received in culture bottles [place] Performed at [facility], [address] [place], [place] [address]    Culture   Final    NO GROWTH 2 DAYS Performed at [facility], [place] [address], [place], [place] [address]    Report Status PENDING  Incomplete  Culture, blood (routine x 2)     Status: None (Preliminary result)   Collection Time: 08/16/22  7:08 AM   Specimen: BLOOD RIGHT HAND  Result Value Ref Range Status   Specimen Description   Final    BLOOD RIGHT HAND Performed at [facility], [address] [place], [place] [address]    Special Requests   Final    BOTTLES DRAWN AEROBIC AND ANAEROBIC  Blood Culture adequate volume Performed at [facility], [address] [place], [place] [address]    Culture   Final    NO GROWTH 2 DAYS Performed at [facility], [place] [address], [place],  [address]    Report Status PENDING  Incomplete         Radiology Studies: No results found.  Scheduled Meds:  Chlorhexidine Gluconate Cloth  6 each Topical Daily   enoxaparin (LOVENOX) injection  40 mg Subcutaneous Q24H   morphine   Intravenous Q4H   nicotine  21 mg Transdermal QHS   pantoprazole (PROTONIX) IV  40 mg Intravenous Q12H   sodium chloride flush  3 mL Intravenous Q12H   Continuous Infusions:  0.45 % NaCl with KCl 20 mEq / L 75 mL/hr at 08/19/22 0251   chlorproMAZINE (THORAZINE) 12.5 mg in sodium chloride 0.9 % 25 mL IVPB 12.5 mg (08/19/22 0347)   methocarbamol (ROBAXIN) IV 500 mg (08/17/22 1439)   piperacillin-tazobactam (ZOSYN)  IV 3.375 g (08/19/22 0657)     LOS: 3 days   Time spent: 37mn  Harrington Jobe C Kendyl Festa, DO [facility]  If 7PM-7AM, please contact night-coverage [email or web address]  08/19/2022, 7:47 AM

## 2022-08-19 NOTE — Progress Notes (Signed)
Chaplain received consult from unit Case Manager indicating that Mr Klugh requested a visit with a priest. CM was unsure whether pt specifically wanted a priest or a chaplain would suffice. Chaplain checked in with pt who reports he does not specifically need a priest and welcomes support from a chaplain. However, pt is trying to get his pain pumped fixed and pt's RNs entered the room to address this issue shortly after my arrival. Will follow up within the next 24 hours. Please page if urgent need arises.    Donald Prose. Elyn Peers, M.Div. [facility] Chaplain Pager [phone] Office [phone]

## 2022-08-19 NOTE — Progress Notes (Signed)
Patient refused his dressing change,education provided x 3

## 2022-08-20 ENCOUNTER — Inpatient Hospital Stay: Payer: Self-pay

## 2022-08-20 DIAGNOSIS — K631 Perforation of intestine (nontraumatic): Secondary | ICD-10-CM | POA: Diagnosis not present

## 2022-08-20 LAB — CBC
HCT: 29.8 % — ABNORMAL LOW (ref 39.0–52.0)
Hemoglobin: 10.5 g/dL — ABNORMAL LOW (ref 13.0–17.0)
MCH: 31.3 pg (ref 26.0–34.0)
MCHC: 35.2 g/dL (ref 30.0–36.0)
MCV: 89 fL (ref 80.0–100.0)
Platelets: 180 10*3/uL (ref 150–400)
RBC: 3.35 MIL/uL — ABNORMAL LOW (ref 4.22–5.81)
RDW: 13.6 % (ref 11.5–15.5)
WBC: 13.3 10*3/uL — ABNORMAL HIGH (ref 4.0–10.5)
nRBC: 0 % (ref 0.0–0.2)

## 2022-08-20 LAB — BASIC METABOLIC PANEL
Anion gap: 10 (ref 5–15)
BUN: <5 mg/dL — ABNORMAL LOW (ref 6–20)
CO2: 21 mmol/L — ABNORMAL LOW (ref 22–32)
Calcium: 7.5 mg/dL — ABNORMAL LOW (ref 8.9–10.3)
Chloride: 100 mmol/L (ref 98–111)
Creatinine, Ser: 0.62 mg/dL (ref 0.61–1.24)
GFR, Estimated: >60 mL/min (ref >60)
Glucose, Bld: 113 mg/dL — ABNORMAL HIGH (ref 70–99)
Potassium: 3.4 mmol/L — ABNORMAL LOW (ref 3.5–5.1)
Sodium: 131 mmol/L — ABNORMAL LOW (ref 135–145)

## 2022-08-20 LAB — GLUCOSE, CAPILLARY: Glucose-Capillary: 132 mg/dL — ABNORMAL HIGH (ref 70–99)

## 2022-08-20 MED ORDER — TRAVASOL 10 % IV SOLN
INTRAVENOUS | Status: AC
  Filled 2022-08-20: qty 420

## 2022-08-20 MED ORDER — DEXTROSE-NACL 5-0.45 % IV SOLN: INTRAVENOUS | Status: DC

## 2022-08-20 MED ORDER — SODIUM CHLORIDE 0.9% FLUSH
10.0000 mL | INTRAVENOUS | Status: DC | PRN
  Administered 2022-08-25 – 2022-09-11 (×5): 10 mL

## 2022-08-20 NOTE — Anesthesia Postprocedure Evaluation (Addendum)
Anesthesia Post Note  Patient: TEREK BEE  Procedure(s) Performed: EXPLORATORY LAPAROTOMY (Abdomen) COLOSTOMY (Left: Abdomen) PARTIAL SIGMOID COLECTOMY (Abdomen)     Patient location during evaluation: PACU Anesthesia Type: General Level of consciousness: awake and alert Pain management: pain level controlled Vital Signs Assessment: post-procedure vital signs reviewed and stable Respiratory status: spontaneous breathing, nonlabored ventilation and respiratory function stable Cardiovascular status: blood pressure returned to baseline and stable Postop Assessment: no apparent nausea or vomiting Anesthetic complications: no   No notable events documented.  Last Vitals:  Vitals:    08/20/22 0759  BP:  (!) 121/107  Pulse:  (!) 103  Resp:  18  Temp:  37.4 C  SpO2:  97%    Last Pain:  Vitals:   08/20/22 0919  TempSrc:   PainSc: 7    Pain Goal:                   Lidia Collum

## 2022-08-20 NOTE — Progress Notes (Signed)
PHARMACY - TOTAL PARENTERAL NUTRITION CONSULT NOTE   Indication: Prolonged ileus  Patient Measurements: Height: 5' 5.5" (166.4 cm) Weight: 52.6 kg (116 lb) IBW/kg (Calculated) : 62.65 TPN AdjBW (KG): 52.6 Body mass index is 19.01 kg/m.  Usual Weight: 115 lbs  Assessment:  61 yo male with a history of chronic pain syndrome on Suboxone, chronic pancreatitis with prior surgery for pseudocyst removal in the 1990s, prior BII in the 1980s, COPD, rheumatoid arthritis on Enbrel, history of seizures not on an AED, and Zollinger-Ellison syndrome who presented with acute onset of abdominal pain along with N/V. He was found to have a WBC of 22K, lactic acid of 3.1 and a CT scan concerning for a colonic perforation. Pharmacy consulted to initiate TPN for prolonged ileus.  Glucose / Insulin: no hx DM, CBGs < 120 with no insulin needed; x1 decadron dose 9/15 Electrolytes: K 3.4, Na 131, CO2 21, CoCa 8.8, others wnl Renal: Scr 0.62 (bsl < 1); BUN wnl Hepatic: (9/16) LFTs wnl, Tbili 1.3, Alb 2.4 Intake / Output; MIVF: UOP 1.4 ml/kg/hr; Drain down at 75m; no emesis x2 days  GI Imaging: 9/15 colonic perforation in the region of the sigmoid colon likely secondary to impacted stool GI Surgeries / Procedures:  9/15 Exploratory laparotomy, Left colostomy and partial sigmoid colectomy   Central access: (08/20/22) pending placement  TPN start date: 08/20/22  Nutritional Goals: Goal TPN rate is 70 mL/hr (provides 84 g of protein and 1690 kcals per day)  RD Assessment: pending     Current Nutrition:  NPO  Plan:  Start TPN at 367mhr (50% goal rate) at 1800 to provide 42g AA and 846 kcal Electrolytes in TPN: Na 5047mL, K 75m4m, Ca 5mEq80m Mg 5mEq/30mand Phos 15mmol42mCl:Ac 1:1 Add standard MVI and trace elements to TPN Monitor CBGs, initiate SSI when needed   Reduce D5-1/2NS to 10 mL/hr at 1800 Monitor TPN labs on Mon/Thurs, as needed   Thank you for allowing pharmacy to be a part of this  patient's care.  AdriennArdyth HarpsD Clinical Pharmacist

## 2022-08-20 NOTE — Progress Notes (Signed)
PROGRESS NOTE    Jerry Knight  ONG:[id number] DOB: [birth year] DOA: 08/16/2022 PCP: Beverley Fiedler, FNP   Brief Narrative:  Jerry Knight is a 61 y.o. male with medical history significant of  COPD, chronic pancreatitis, rheumatoid arthritis on Enbrel, history of seizures not on an AED, chronic pain on Suboxone, polysubstance abuse, Zollinger-Ellison syndrome, and constipation who presented with complaints of  lower abdominal pain. Imaging remarkable for colonic perforation/obstruction - transferred to main campus for surgical intervention. Hospitalist called for consult.  Assessment & Plan:   Principal Problem:   Bowel perforation (HCC) Active Problems:   Sepsis ([place])   Normocytic anemia   Hypokalemia   COPD (chronic obstructive pulmonary disease) (HCC)   Rheumatoid arthritis (HCC)   Anxiety   GERD (gastroesophageal reflux disease)   Chronic neck and back pain   Tobacco abuse  Sepsis secondary to bowel perforation secondary to stercoral ulcer Exploratory laparotomy, Left colostomy and partial sigmoid colectomy (08/16/22) -Tachycardic and tachypneic with initial WBC 22.4 and lactic acid 3.1 - sepsis criteria resolved -CT revealed colonic perforation in the region of the sigmoid colon thought secondary to impacted stool.  -Continue Zosyn -Surgery as above 08/16/22 - tolerated well -Currently NPO with ostomy, NG accidentally removed 18th, will hold off on replacement given improvement -PICC line ordered, likely transition to TPN (currently D5 1/2NS+K) given bowel function has not yet returned   Normocytic anemia Likely chronic anemia of chronic disease given his history/comorbidities Post op/hemodilutional - follow repeat labs   Hypokalemia, resolved Hypovolemic hyponatremia -Continue IVF/supplement while NPO perioperatively -TPN to initiate as above, ongoing electrolyte replacement through TPN/pharmacy recommendations   COPD, without exacerbation Chest x-ray noted  chronic interstitial lung changes with bibasilar atelectasis. -Albuterol nebs as needed   Rheumatoid arthritis Patient had been on Enbrel 50 mg and methotrexate 10 mg on Saturdays. -Hold current medication regimen given above   Anxiety Home medication regimen include amitriptyline '100mg'$  HS, Ativan 1 mg TID, and mirtazapine '45mg'$  HS. -Ativan 0.5 mg every 4 hours as needed for anxiety until PO route established   GERD -Protonix 40 mg IV twice daily   Chronic pain  Patient has been on Suboxone. Continue dilaudid PCA per surgery   Tobacco abuse Patient reportedly smokes 2 packs of cigarettes per day on average. -Nicotine patch offered   DVT prophylaxis: SCDs Advance Care Planning:   Code Status: Full Code  Consults: General surgery  Status is: inpt  Dispo: The patient is from: home              Anticipated d/c is to: TBD              Anticipated d/c date is: 72+h              Patient currently NOT medically stable for discharge  Consultants:  Gen Sx  Procedures:  Exploratory laparotomy, Left colostomy and partial sigmoid colectomy (08/16/22)  Antimicrobials:  Continue zosyn for now and consider narrowing based on cultures (negative to date)  Subjective: No acute issues or events overnight, difficulty with IV access transitioning to PICC line for definitive IV access as well as for need for TPN.  Otherwise denies nausea vomiting diarrhea constipation headache fevers chills or chest pain  Objective: Vitals:   08/20/22 0141 08/20/22 0414 08/20/22 0427 08/20/22 0759  BP:  (!) 126/97  (!) 121/107  Pulse:  (!) 106  (!) 103  Resp: '18  18 18  '$ Temp:  98.2 F (36.8 C)  99.3 F (  37.4 C)  TempSrc:  Oral  Oral  SpO2: 97% 98% 94% 97%  Weight:      Height:        Intake/Output Summary (Last 24 hours) at 08/20/2022 0801 Last data filed at 08/20/2022 0730 Gross per 24 hour  Intake 4631.43 ml  Output 1725 ml  Net 2906.43 ml    Filed Weights   08/16/22 0501  Weight: 52.6 kg     Examination:  General: Generally malnourished/cachectic gentleman pleasantly resting in bed, No acute distress. HEENT:  Normocephalic atraumatic.  Sclerae nonicteric, noninjected.  Extraocular movements intact bilaterally. Neck:  Without mass or deformity.  Trachea is midline. Lungs:  Clear to auscultate bilaterally without rhonchi, wheeze, or rales. Heart: Tachycardic without murmurs rubs or gallops. Abdomen: Postop bandage clean dry intact, ostomy intact without any output or gas. Extremities: Without cyanosis, clubbing, edema, or obvious deformity.   Data Reviewed: I have personally reviewed following labs and imaging studies  CBC: Recent Labs  Lab 08/16/22 0506 08/18/22 0017 08/19/22 0035 08/20/22 0040  WBC 22.4* 17.5* 14.2* 13.3*  NEUTROABS 19.7*  --   --   --   HGB 12.6* 10.1* 10.2* 10.5*  HCT 38.0* 29.1* 29.2* 29.8*  MCV 92.5 91.5 90.4 89.0  PLT 235 158 173 810    Basic Metabolic Panel: Recent Labs  Lab 08/16/22 0506 08/17/22 0048 08/18/22 0017 08/19/22 0035 08/20/22 0040  NA 136 136 136 132* 131*  K 3.4* 3.6 3.9 3.7 3.4*  CL 105 105 105 100 100  CO2 24 21* 24 21* 21*  GLUCOSE 176* 119* 99 68* 113*  BUN '11 10 10 8 '$ <5*  CREATININE 0.82 0.68 0.70 0.76 0.62  CALCIUM 8.5* 8.0* 7.8* 7.6* 7.5*  MG  --  1.2*  --   --   --     GFR: Estimated Creatinine Clearance: 74 mL/min (by C-G formula based on SCr of 0.62 mg/dL). Liver Function Tests: Recent Labs  Lab 08/16/22 0506 08/17/22 0048  AST 24 19  ALT 11 12  ALKPHOS 94 45  BILITOT 0.7 1.3*  PROT 7.3 5.0*  ALBUMIN 3.4* 2.4*    Recent Labs  Lab 08/16/22 0506  LIPASE 39    No results for input(s): "AMMONIA" in the last 168 hours. Coagulation Profile: No results for input(s): "INR", "PROTIME" in the last 168 hours. Cardiac Enzymes: No results for input(s): "CKTOTAL", "CKMB", "CKMBINDEX", "TROPONINI" in the last 168 hours. BNP (last 3 results) No results for input(s): "PROBNP" in the last 8760  hours. HbA1C: No results for input(s): "HGBA1C" in the last 72 hours. CBG: No results for input(s): "GLUCAP" in the last 168 hours. Lipid Profile: No results for input(s): "CHOL", "HDL", "LDLCALC", "TRIG", "CHOLHDL", "LDLDIRECT" in the last 72 hours. Thyroid Function Tests: No results for input(s): "TSH", "T4TOTAL", "FREET4", "T3FREE", "THYROIDAB" in the last 72 hours. Anemia Panel: No results for input(s): "VITAMINB12", "FOLATE", "FERRITIN", "TIBC", "IRON", "RETICCTPCT" in the last 72 hours. Sepsis Labs: Recent Labs  Lab 08/16/22 0506 08/16/22 1820 08/16/22 2138  LATICACIDVEN 3.1* 3.8* 3.1*     Recent Results (from the past 240 hour(s))  Culture, blood (routine x 2)     Status: None (Preliminary result)   Collection Time: 08/16/22  6:55 AM   Specimen: BLOOD  Result Value Ref Range Status   Specimen Description   Final    BLOOD RIGHT ANTECUBITAL Performed at [facility], [address], [place], [place] [address]    Special Requests   Final  Blood Culture results may not be optimal due to an inadequate volume of blood received in culture bottles BOTTLES DRAWN AEROBIC AND ANAEROBIC Performed at [facility], [address] [place], [place] [address]    Culture   Final    NO GROWTH 3 DAYS Performed at [facility], [place] [address], [place], [place] [address]    Report Status PENDING  Incomplete  Culture, blood (routine x 2)     Status: None (Preliminary result)   Collection Time: 08/16/22  7:08 AM   Specimen: BLOOD RIGHT HAND  Result Value Ref Range Status   Specimen Description   Final    BLOOD RIGHT HAND Performed at [facility], [address] [place], [place] [address]    Special Requests   Final    BOTTLES DRAWN AEROBIC AND ANAEROBIC Blood Culture adequate volume Performed at [facility], [address] [place], [place] [address]    Culture   Final    NO GROWTH 3 DAYS Performed at [facility],  [place] [address] [place], [place] [address]    Report Status PENDING  Incomplete         Radiology Studies: No results found.  Scheduled Meds:  Chlorhexidine Gluconate Cloth  6 each Topical Daily   cyclobenzaprine  10 mg Oral TID   enoxaparin (LOVENOX) injection  40 mg Subcutaneous Q24H   morphine   Intravenous Q4H   nicotine  21 mg Transdermal QHS   pantoprazole (PROTONIX) IV  40 mg Intravenous Q12H   sodium chloride flush  3 mL Intravenous Q12H   Continuous Infusions:  chlorproMAZINE (THORAZINE) 12.5 mg in sodium chloride 0.9 % 25 mL IVPB Stopped (08/19/22 0419)   dextrose 5 % and 0.45 % NaCl with KCl 20 mEq/L 75 mL/hr at 08/19/22 1736   piperacillin-tazobactam (ZOSYN)  IV 3.375 g (08/20/22 0451)     LOS: 4 days   Time spent: 68mn  Chesney Klimaszewski C Aydden Cumpian, DO [facility]  If 7PM-7AM, please contact night-coverage [email or web address]  08/20/2022, 8:01 AM

## 2022-08-20 NOTE — TOC Initial Note (Signed)
Transition of Care [facility]) - Initial/Assessment Note    Patient Details  Name: Jerry Knight MRN: [id number] Date of Birth: [birth year]  Transition of Care [facility]) CM/SW Contact:    Marilu Favre, RN Phone Number: 08/20/2022, 11:52 AM  Clinical Narrative:                 Spoke to patient at bedside.    PCP Beverley Fiedler   Patient from home with wife. Wife is retired Secretary/administrator.   Discussed home health RN at discharge. Patient in agreement. Patient understands [facility] will not make daily visits. Patient will need support person to assist with dressing changes, ostomy and JP. Patient voiced understanding. WOC has started education with his wife.   Provided patient with medicare.gov list of home health agencies. Patient will discuss with his wife, brother in law and PCP .   TOC team will continue to follow for discharge needs   Expected Discharge Plan: [place] Barriers to Discharge: Continued Medical Work up   Patient Goals and CMS Choice Patient states their goals for this hospitalization and ongoing recovery are:: to return to home CMS Medicare.gov Compare Post Acute Care list provided to:: Patient Choice offered to / list presented to : Patient  Expected Discharge Plan and Services Expected Discharge Plan: [place]   Discharge Planning Services: CM Consult Post Acute Care Choice: [place] arrangements for the past 2 months: Single Family Home                 DME Arranged: N/A DME Agency: NA       HH Arranged: RN          Prior Living Arrangements/Services Living arrangements for the past 2 months: Single Family Home Lives with:: Spouse Patient language and need for interpreter reviewed:: Yes Do you feel safe going back to the place where you live?: Yes      Need for Family Participation in Patient Care: Yes (Comment) Care giver support system in place?: Yes (comment)   Criminal Activity/Legal Involvement  Pertinent to Current Situation/Hospitalization: No - Comment as needed  Activities of Daily Living      Permission Sought/Granted   Permission granted to share information with : No              Emotional Assessment Appearance:: Appears stated age Attitude/Demeanor/Rapport: Engaged Affect (typically observed): Accepting Orientation: : Oriented to Self, Oriented to Place, Oriented to  Time, Oriented to Situation Alcohol / Substance Use: Not Applicable Psych Involvement: No (comment)  Admission diagnosis:  Bowel perforation (HCC) [K63.1] Gastric outlet obstruction [K31.1] Generalized abdominal pain [R10.84] Patient Active Problem List   Diagnosis Date Noted   Bowel perforation ([place]) 08/16/2022   Sepsis ([place]) 08/16/2022   Normocytic anemia 08/16/2022   COPD (chronic obstructive pulmonary disease) ([place]) 08/16/2022   Rheumatoid arthritis ([place]) 08/16/2022   Anxiety 08/16/2022   GERD (gastroesophageal reflux disease) 08/16/2022   Tobacco abuse 08/16/2022   Constipation 04/21/2020   Chronic neck and back pain 04/21/2020   Psychophysiological insomnia 04/21/2020   Opioid dependence on maintenance agonist therapy, no symptoms ([place]) 02/01/2019   Loss of weight 01/22/2013   Hypokalemia 01/22/2013   Substance abuse ([place]) 01/22/2013   Pancreatitis, chronic ([place]) 08/27/2012   Abdominal pain, epigastric 08/27/2012   Chest pain 01/26/2012   SOB (shortness of breath) 01/26/2012   PCP:  Beverley Fiedler, FNP Pharmacy:   [id number] [place], [place] -  [place] [address] Phone: [phone] Fax: [phone]  [facility] [address], [place] [address] Phone: [phone] Fax: [place], [place] - [place] AT [facility] [place] [address] Phone:  [phone] Fax: [phone]  [facility] DR[facility] STORE [place], [place] - [place] AT [facility] OF [place] [address] Phone: [phone] Fax: [phone]     Social Determinants of Health (SDOH) Interventions    Readmission Risk Interventions     No data to display

## 2022-08-20 NOTE — Progress Notes (Addendum)
Progress Note  4 Days Post-Op  Subjective: Having abdominal pain this morning and using PCA. No bowel function yet. No nausea/emesis. Has been OOB in room. Voiding well after foley removal  Objective: Vital signs in last 24 hours: Temp:  [98.2 F (36.8 C)-99.3 F (37.4 C)] 99.3 F (37.4 C) (09/19 0759) Pulse Rate:  [99-106] 103 (09/19 0759) Resp:  [11-21] 18 (09/19 0759) BP: (121-138)/(77-115) 121/107 (09/19 0759) SpO2:  [93 %-98 %] 97 % (09/19 0759) FiO2 (%):  [36 %-37 %] 36 % (09/19 0427)    Intake/Output from previous day: 09/18 0701 - 09/19 0700 In: 4631.4 [I.V.:4138.9; IV Piggyback:492.5] Out: 1125 [Urine:1110; Drains:15] Intake/Output this shift: Total I/O In: -  Out: 600 [Urine:600]  PE: General: pleasant, WD, male who is laying in bed in NAD Heart: tachycardic. Regular rhythm Lungs: Respiratory effort nonlabored Abd: soft, ND, colostomy with thin SS fluid. Stoma pink and viable. Drain with SS output. Midline wound with dressing changed by me this am. Mild surrounding erythema. No induration or purulent drainage MSK: all 4 extremities are symmetrical with no cyanosis, clubbing, or edema. Skin: warm and dry Psych: A&Ox3 with an appropriate affect.       Lab Results:  Recent Labs    08/19/22 0035 08/20/22 0040  WBC 14.2* 13.3*  HGB 10.2* 10.5*  HCT 29.2* 29.8*  PLT 173 180    BMET Recent Labs    08/19/22 0035 08/20/22 0040  NA 132* 131*  K 3.7 3.4*  CL 100 100  CO2 21* 21*  GLUCOSE 68* 113*  BUN 8 <5*  CREATININE 0.76 0.62  CALCIUM 7.6* 7.5*    PT/INR No results for input(s): "LABPROT", "INR" in the last 72 hours. CMP     Component Value Date/Time   NA 131 (L) 08/20/2022 0040   NA 140 10/12/2020 1526   K 3.4 (L) 08/20/2022 0040   CL 100 08/20/2022 0040   CO2 21 (L) 08/20/2022 0040   GLUCOSE 113 (H) 08/20/2022 0040   BUN <5 (L) 08/20/2022 0040   BUN 10 10/12/2020 1526   CREATININE 0.62 08/20/2022 0040   CREATININE 0.77  01/27/2013 1105   CALCIUM 7.5 (L) 08/20/2022 0040   PROT 5.0 (L) 08/17/2022 0048   PROT 7.5 10/12/2020 1526   ALBUMIN 2.4 (L) 08/17/2022 0048   ALBUMIN 4.5 10/12/2020 1526   AST 19 08/17/2022 0048   ALT 12 08/17/2022 0048   ALKPHOS 45 08/17/2022 0048   BILITOT 1.3 (H) 08/17/2022 0048   BILITOT 0.3 10/12/2020 1526   GFRNONAA >60 08/20/2022 0040   GFRAA 123 10/12/2020 1526   Lipase     Component Value Date/Time   LIPASE 39 08/16/2022 0506       Studies/Results: No results found.  Anti-infectives: Anti-infectives (From admission, onward)    Start     Dose/Rate Route Frequency Ordered Stop   08/17/22 1630  piperacillin-tazobactam (ZOSYN) IVPB 3.375 g        3.375 g 12.5 mL/hr over 240 Minutes Intravenous Every 8 hours 08/17/22 1540     08/16/22 1500  piperacillin-tazobactam (ZOSYN) IVPB 3.375 g        3.375 g 100 mL/hr over 30 Minutes Intravenous STAT 08/16/22 1446 08/16/22 2032   08/16/22 0615  piperacillin-tazobactam (ZOSYN) IVPB 3.375 g        3.375 g 100 mL/hr over 30 Minutes Intravenous  Once 08/16/22 0609 08/16/22 0713        Assessment/Plan Perforated stercoral ulcer POD 4 ex lap partial sigmoid  colectomy, colostomy - history of chronic pain on suboxone. Continue PCA. Baseline flexeril added by primary. Okay for po oxycodone and will continue to adjust pain medications once able to take in more PO - NGT out overnight 9/17-9/18- no n/v this am. Replace NGT for nausea/emesis - continue NPO/sips with meds and await ROBF - WBC improving to 13.3 - continue IV zosyn for now - drain SS - continue drain - WTD dressing changes to midline wound - WOC RN following for new ostomy - encourage ambulation and IS use  Will need to consider TPN soon if still no bowel function  FEN - NPO/IVFs VTE - SCDs, lovenox ID - zosyn 9/14> Foley - out 9/18. voiding   Per primary Tobacco abuse/COPD - 2 ppd H/O substance abuse H/O opioid dependence - now on suboxone prn H/O  seizure d/o Chronic pancreatitis Zollinger-Ellison syndrome GERD Hypoalbuminemic - appears malnourished at baseline due to muscle wasting RA - mtx and enbrel held   LOS: 4 days   Winferd Humphrey, [facility] 08/20/2022, 8:17 AM Please see Amion for pager number during day hours 7:00am-4:30pm

## 2022-08-20 NOTE — Plan of Care (Signed)

## 2022-08-20 NOTE — Progress Notes (Signed)
Peripherally Inserted Central Catheter Placement  The IV Nurse has discussed with the patient and/or persons authorized to consent for the patient, the purpose of this procedure and the potential benefits and risks involved with this procedure.  The benefits include less needle sticks, lab draws from the catheter, and the patient may be discharged home with the catheter. Risks include, but not limited to, infection, bleeding, blood clot (thrombus formation), and puncture of an artery; nerve damage and irregular heartbeat and possibility to perform a PICC exchange if needed/ordered by physician.  Alternatives to this procedure were also discussed.  [facility] PICC patient education guide, fact sheet on infection prevention and patient information card has been provided to patient /or left at bedside.    PICC Placement Documentation  PICC Double Lumen 38/33/38 Right Basilic 38 cm 0 cm (Active)  Indication for Insertion or Continuance of Line Administration of hyperosmolar/irritating solutions (i.e. TPN, Vancomycin, etc.) 08/20/22 1449  Exposed Catheter (cm) 0 cm 08/20/22 1449  Site Assessment Clean, Dry, Intact 08/20/22 1449  Lumen #1 Status Flushed;Saline locked;Blood return noted 08/20/22 1449  Lumen #2 Status Flushed;Saline locked;Blood return noted 08/20/22 1449  Dressing Type Transparent;Securing device 08/20/22 1449  Dressing Status Antimicrobial disc in place 08/20/22 1449  Dressing Intervention New dressing;Other (Comment) 08/20/22 1449  Dressing Change Due 08/27/22 08/20/22 1449       Jerry Knight 08/20/2022, 2:50 PM

## 2022-08-21 DIAGNOSIS — J410 Simple chronic bronchitis: Secondary | ICD-10-CM | POA: Diagnosis not present

## 2022-08-21 DIAGNOSIS — M542 Cervicalgia: Secondary | ICD-10-CM | POA: Diagnosis not present

## 2022-08-21 DIAGNOSIS — F419 Anxiety disorder, unspecified: Secondary | ICD-10-CM | POA: Diagnosis not present

## 2022-08-21 DIAGNOSIS — K631 Perforation of intestine (nontraumatic): Secondary | ICD-10-CM | POA: Diagnosis not present

## 2022-08-21 DIAGNOSIS — R1084 Generalized abdominal pain: Secondary | ICD-10-CM

## 2022-08-21 DIAGNOSIS — K311 Adult hypertrophic pyloric stenosis: Secondary | ICD-10-CM

## 2022-08-21 LAB — COMPREHENSIVE METABOLIC PANEL
ALT: 10 U/L (ref 0–44)
AST: 14 U/L — ABNORMAL LOW (ref 15–41)
Albumin: 1.9 g/dL — ABNORMAL LOW (ref 3.5–5.0)
Alkaline Phosphatase: 42 U/L (ref 38–126)
Anion gap: 7 (ref 5–15)
BUN: <5 mg/dL — ABNORMAL LOW (ref 6–20)
CO2: 26 mmol/L (ref 22–32)
Calcium: 7.8 mg/dL — ABNORMAL LOW (ref 8.9–10.3)
Chloride: 99 mmol/L (ref 98–111)
Creatinine, Ser: 0.49 mg/dL — ABNORMAL LOW (ref 0.61–1.24)
GFR, Estimated: >60 mL/min (ref >60)
Glucose, Bld: 130 mg/dL — ABNORMAL HIGH (ref 70–99)
Potassium: 3.2 mmol/L — ABNORMAL LOW (ref 3.5–5.1)
Sodium: 132 mmol/L — ABNORMAL LOW (ref 135–145)
Total Bilirubin: 0.7 mg/dL (ref 0.3–1.2)
Total Protein: 5.4 g/dL — ABNORMAL LOW (ref 6.5–8.1)

## 2022-08-21 LAB — CBC
HCT: 30.1 % — ABNORMAL LOW (ref 39.0–52.0)
Hemoglobin: 10.3 g/dL — ABNORMAL LOW (ref 13.0–17.0)
MCH: 31.1 pg (ref 26.0–34.0)
MCHC: 34.2 g/dL (ref 30.0–36.0)
MCV: 90.9 fL (ref 80.0–100.0)
Platelets: 187 10*3/uL (ref 150–400)
RBC: 3.31 MIL/uL — ABNORMAL LOW (ref 4.22–5.81)
RDW: 13.8 % (ref 11.5–15.5)
WBC: 15.4 10*3/uL — ABNORMAL HIGH (ref 4.0–10.5)
nRBC: 0 % (ref 0.0–0.2)

## 2022-08-21 LAB — GLUCOSE, CAPILLARY
Glucose-Capillary: 123 mg/dL — ABNORMAL HIGH (ref 70–99)
Glucose-Capillary: 126 mg/dL — ABNORMAL HIGH (ref 70–99)
Glucose-Capillary: 145 mg/dL — ABNORMAL HIGH (ref 70–99)

## 2022-08-21 LAB — CULTURE, BLOOD (ROUTINE X 2)
Culture: NO GROWTH
Culture: NO GROWTH
Special Requests: ADEQUATE

## 2022-08-21 LAB — MAGNESIUM: Magnesium: 1.7 mg/dL (ref 1.7–2.4)

## 2022-08-21 LAB — PHOSPHORUS: Phosphorus: 1.4 mg/dL — ABNORMAL LOW (ref 2.5–4.6)

## 2022-08-21 LAB — TRIGLYCERIDES: Triglycerides: 130 mg/dL (ref <150)

## 2022-08-21 MED ORDER — TRAVASOL 10 % IV SOLN
INTRAVENOUS | Status: AC
  Filled 2022-08-21: qty 840

## 2022-08-21 MED ORDER — POTASSIUM CHLORIDE 20 MEQ PO PACK
40.0000 meq | PACK | Freq: Once | ORAL | Status: AC
  Administered 2022-08-21: 40 meq via ORAL
  Filled 2022-08-21: qty 2

## 2022-08-21 MED ORDER — ACETAMINOPHEN 650 MG RE SUPP: 650.0000 mg | Freq: Four times a day (QID) | RECTAL | Status: DC

## 2022-08-21 MED ORDER — K PHOS MONO-SOD PHOS DI & MONO 155-852-130 MG PO TABS
500.0000 mg | ORAL_TABLET | Freq: Three times a day (TID) | ORAL | Status: AC
  Administered 2022-08-21: 500 mg via ORAL
  Filled 2022-08-21 (×3): qty 2

## 2022-08-21 MED ORDER — ACETAMINOPHEN 325 MG PO TABS: 650.0000 mg | ORAL_TABLET | Freq: Four times a day (QID) | ORAL | Status: DC

## 2022-08-21 MED ORDER — POTASSIUM PHOSPHATES 15 MMOLE/5ML IV SOLN
30.0000 mmol | Freq: Once | INTRAVENOUS | Status: AC
  Administered 2022-08-21: 30 mmol via INTRAVENOUS
  Filled 2022-08-21: qty 10

## 2022-08-21 MED ORDER — TRAZODONE HCL 150 MG PO TABS
150.0000 mg | ORAL_TABLET | Freq: Every day | ORAL | Status: DC
  Administered 2022-08-21 – 2022-09-10 (×21): 150 mg via ORAL
  Filled 2022-08-21 (×22): qty 1

## 2022-08-21 MED ORDER — TIZANIDINE HCL 2 MG PO TABS
2.0000 mg | ORAL_TABLET | Freq: Four times a day (QID) | ORAL | Status: DC | PRN
  Administered 2022-08-21 (×2): 2 mg via ORAL
  Filled 2022-08-21 (×2): qty 1

## 2022-08-21 MED ORDER — LIDOCAINE 5 % EX PTCH
1.0000 | MEDICATED_PATCH | CUTANEOUS | Status: DC
  Administered 2022-08-21 – 2022-08-25 (×4): 1 via TRANSDERMAL
  Filled 2022-08-21 (×11): qty 1

## 2022-08-21 NOTE — Progress Notes (Signed)
PHARMACY - TOTAL PARENTERAL NUTRITION CONSULT NOTE   Indication: Prolonged ileus  Patient Measurements: Height: 5' 5.5" (166.4 cm) Weight: 52.6 kg (116 lb) IBW/kg (Calculated) : 62.65 TPN AdjBW (KG): 52.6 Body mass index is 19.01 kg/m.  Usual Weight: 115 lbs  Assessment:  61 yo male with a history of chronic pain syndrome on Suboxone, chronic pancreatitis with prior surgery for pseudocyst removal in the 1990s, prior BII in the 1980s, COPD, rheumatoid arthritis on Enbrel, history of seizures not on an AED, and Zollinger-Ellison syndrome who presented with acute onset of abdominal pain along with N/V. He was found to have a WBC of 22K, lactic acid of 3.1 and a CT scan concerning for a colonic perforation. Pharmacy consulted to initiate TPN for prolonged ileus.  Glucose / Insulin: no hx DM, CBGs 123-132; x1 decadron dose 9/15 Electrolytes: K 3.4, Na 131, CO2 21, CoCa 8.8, others wnl Renal: Scr 0.62 (bsl < 1); BUN wnl Hepatic: (9/16) LFTs wnl, Tbili 1.3, Alb 2.4 Intake / Output; MIVF: UOP 1.4 ml/kg/hr; Drain at 20 mL; no emesis x2 days  GI Imaging: 9/15 colonic perforation in the region of the sigmoid colon likely secondary to impacted stool GI Surgeries / Procedures:  9/15 Exploratory laparotomy, Left colostomy and partial sigmoid colectomy   Central access: 08/20/22 TPN start date:  08/20/22  Nutritional Goals: Goal TPN rate is 70 mL/hr (provides 84 g of protein and 1690 kcals per day)  RD Assessment: pending     Current Nutrition:  NPO  Plan:  Increase TPN to full rate of 88m/hr (100% goal rate) at 1800 to provide 84g AA and 1690 kcal Electrolytes in TPN: Na 546m/L, K 5054mL, Ca 5mE77m, Mg 5mEq23m and Phos 15mmo6m Cl:Ac 1:1 X1 potassium phosphate 30mmol60m(kcl 40meq P62mven early this AM) Add standard MVI and trace elements to TPN Monitor CBGs, initiate SSI when needed   Continue MIVF @ 10 mL/hr Monitor TPN labs on Mon/Thurs, as needed   Thank you for allowing  pharmacy to be a part of this patient's care.  AdrienneArdyth Harps Clinical Pharmacist

## 2022-08-21 NOTE — Progress Notes (Signed)
Progress Note  5 Days Post-Op  Subjective: Some thicker fluid in colostomy bag this am but no real bowel function yet. No nausea or emesis and he has had some PO medications. Have pain at incision and lower abdomen which he describes as feeling like he needs to have a bowel movement. Using PCA and asking for prn PO oxycodone as well.  Objective: Vital signs in last 24 hours: Temp:  [98.4 F (36.9 C)-99.9 F (37.7 C)] 98.4 F (36.9 C) (09/20 0418) Pulse Rate:  [90-109] 90 (09/20 0418) Resp:  [12-18] 14 (09/20 0417) BP: (131-167)/(71-89) 132/71 (09/20 0418) SpO2:  [94 %-98 %] 98 % (09/20 0418) FiO2 (%):  [29 %-37 %] 29 % (09/20 0029)    Intake/Output from previous day: 09/19 0701 - 09/20 0700 In: 2163.9 [P.O.:100; I.V.:1970.4; IV Piggyback:93.5] Out: 2180 [Urine:2175; Drains:5] Intake/Output this shift: No intake/output data recorded.  PE: General: pleasant, WD, male who is laying in bed in NAD Heart: regular rate. Regular rhythm Lungs: Respiratory effort nonlabored Abd: soft, ND, colostomy with some thicker output this morning but no gas or overt stool. Stoma pink and viable. Drain with SS output. Midline wound with dressing changed by me this am. Mild surrounding erythema. No induration or purulent drainage. See pic below Skin: warm and dry Psych: A&Ox3 with an appropriate affect.         Lab Results:  Recent Labs    08/20/22 0040 08/21/22 0307  WBC 13.3* 15.4*  HGB 10.5* 10.3*  HCT 29.8* 30.1*  PLT 180 187    BMET Recent Labs    08/20/22 0040 08/21/22 0307  NA 131* 132*  K 3.4* 3.2*  CL 100 99  CO2 21* 26  GLUCOSE 113* 130*  BUN <5* <5*  CREATININE 0.62 0.49*  CALCIUM 7.5* 7.8*    PT/INR No results for input(s): "LABPROT", "INR" in the last 72 hours. CMP     Component Value Date/Time   NA 132 (L) 08/21/2022 0307   NA 140 10/12/2020 1526   K 3.2 (L) 08/21/2022 0307   CL 99 08/21/2022 0307   CO2 26 08/21/2022 0307   GLUCOSE 130 (H)  08/21/2022 0307   BUN <5 (L) 08/21/2022 0307   BUN 10 10/12/2020 1526   CREATININE 0.49 (L) 08/21/2022 0307   CREATININE 0.77 01/27/2013 1105   CALCIUM 7.8 (L) 08/21/2022 0307   PROT 5.4 (L) 08/21/2022 0307   PROT 7.5 10/12/2020 1526   ALBUMIN 1.9 (L) 08/21/2022 0307   ALBUMIN 4.5 10/12/2020 1526   AST 14 (L) 08/21/2022 0307   ALT 10 08/21/2022 0307   ALKPHOS 42 08/21/2022 0307   BILITOT 0.7 08/21/2022 0307   BILITOT 0.3 10/12/2020 1526   GFRNONAA >60 08/21/2022 0307   GFRAA 123 10/12/2020 1526   Lipase     Component Value Date/Time   LIPASE 39 08/16/2022 0506       Studies/Results: [place] EKG SITE RITE  Result Date: 08/20/2022 If Site Rite image not attached, placement could not be confirmed due to current cardiac rhythm.   Anti-infectives: Anti-infectives (From admission, onward)    Start     Dose/Rate Route Frequency Ordered Stop   08/17/22 1630  piperacillin-tazobactam (ZOSYN) IVPB 3.375 g        3.375 g 12.5 mL/hr over 240 Minutes Intravenous Every 8 hours 08/17/22 1540     08/16/22 1500  piperacillin-tazobactam (ZOSYN) IVPB 3.375 g        3.375 g 100 mL/hr over 30 Minutes Intravenous STAT  08/16/22 1446 08/16/22 2032   08/16/22 0615  piperacillin-tazobactam (ZOSYN) IVPB 3.375 g        3.375 g 100 mL/hr over 30 Minutes Intravenous  Once 08/16/22 0609 08/16/22 2244        Assessment/Plan Perforated stercoral ulcer POD 5 ex lap partial sigmoid colectomy, colostomy - history of chronic pain on suboxone. Continue PCA. Baseline flexeril added by primary. Okay for po oxycodone and will continue to adjust pain medications once able to take in more PO - NGT out overnight 9/17-9/18- no n/v this am. Replace NGT for nausea/emesis - continue NPO/sips with meds and await ROBF. TPN started 9/19 pm - WBC 15.4 (13.3), afebrile - continue IV zosyn and consider CT scan in the next few days if not improving - drain SS - continue drain - WTD dressing changes to midline wound -  WOC RN following for new ostomy - encourage ambulation and IS use   FEN - NPO/IVFs, TPN VTE - SCDs, lovenox ID - zosyn 9/14> Foley - out 9/18. voiding   Per primary Tobacco abuse/COPD - 2 ppd H/O substance abuse H/O opioid dependence - now on suboxone prn H/O seizure d/o Chronic pancreatitis Zollinger-Ellison syndrome GERD Hypoalbuminemic - appears malnourished at baseline due to muscle wasting RA - mtx and enbrel held   LOS: 5 days   Winferd Humphrey, [facility] 08/21/2022, 8:13 AM Please see Amion for pager number during day hours 7:00am-4:30pm

## 2022-08-21 NOTE — Consult Note (Signed)
[facility] Nurse ostomy consult note Wife is not present for pre-scheduled ostomy teaching today (1030) Patient is very alert and willing to learn.  We will proceed as planned.  Stoma type/location: LLQ colostomy Stomal assessment/size: 1 3/4" pale pink stoma.  Budded Nonproductive, succus present in pouch Peristomal assessment:  intact  midline abdominal wound with NS moist dressing in place Treatment options for stomal/peristomal skin: barrier ring 2 piece 2 3/4" pouch Output succus only Ostomy pouching: 2pc. 2 3/4" pouch with barrier ring  Education provided:  Patient performs pouch change today.  He has not reviewed written materials but seems more engaged today than MOnday. We measure stoma and he is able to cut barrier to fit and assemble to pouch.  I stretch and mold barrier ring. I explain rationale to keep stool diverted into pouch.  Avoid contamination of wound if pouch leaks. He removes backing and applies pouch.  Seals barrier and able to roll closed.  We discuss emptying when 1/3 full, twice weekly pouch changes. Explained that our team will see again Monday if wife would like to be present.  He understands beside staff can assist him with emptying and a pouch change if needed.  Enrolled patient in [place] program: Yes Will follow.  Domenic Moras MSN, RN, FNP-BC CWON Wound, Ostomy, Continence Nurse Pager [phone]

## 2022-08-21 NOTE — Progress Notes (Signed)
Mobility Specialist - Progress Note   08/21/22 1230  Mobility  Activity Transferred from chair to bed  Level of Assistance Independent  Assistive Device None  Distance Ambulated (ft) 0 ft  Activity Response Tolerated well  $Mobility charge 1 Mobility    Pt in recliner requesting to be transferred to bed. Left in bed w/ call bell in reach and all needs met.   Paulla Dolly Mobility Specialist

## 2022-08-21 NOTE — Progress Notes (Signed)
[facility]                                                                              Cedrick Partain, is a 61 y.o. male, DOB - [birth year], FRT:[id number] Admit date - 08/16/2022    Outpatient Primary MD for the patient is Irwin Brakeman, [name], [place]  LOS - 5  days  Chief Complaint  Patient presents with   Abdominal Pain       Brief summary   Jerry Knight is a 61 y.o. male with medical history significant of  COPD, chronic pancreatitis, rheumatoid arthritis on Enbrel, history of seizures not on an AED, chronic pain on Suboxone, polysubstance abuse, Zollinger-Ellison syndrome, and constipation who presented with complaints of  lower abdominal pain.  Imaging remarkable for colonic perforation/obstruction  Patient was admitted for further work-up  Assessment & Plan    Principal Problem: Sepsis secondary to bowel perforation ([place]) due to stercoral ulcer -CT revealed colonic perforation in the region of the sigmoid colon thought secondary to impacted stool.  -Surgery consulted, patient underwent emergent exploratory laparotomy, left colostomy and partial sigmoid colectomy on 9/15 -Met sepsis criteria on admission with tachycardia, tachypnea, leukocytosis, lactic acidosis -Placed on IV Zosyn, continue n.p.o. with ostomy, TPN.  NGT was removed on 18th -Pain not well controlled, continue PCA, oral oxycodone as needed for breakthrough pain.  Has a history of chronic pain on Suboxone outpatient -WBCs trending up, per surgery may need to repeat CT   Normocytic anemia -Likely anemia of chronic disease -Follow CBC  Hypokalemia, hyponatremia -Continue IVF, potassium replaced -Continue TPN     COPD, without exacerbation - Chest x-ray noted chronic interstitial lung changes with bibasilar atelectasis. -Albuterol nebs as needed   History of rheumatoid arthritis -Outpatient on Enbrel 50 mg and methotrexate 10 mg on Saturdays -Currently regimen on hold  Insomnia,  anxiety -For now continue Ativan as needed for anxiety - will place on trazodone 150 mg at bedtime, feeling miserable due to her insomnia at night -Once p.o. route established, will resume home regimen including amitriptyline, Ativan 1 mg 3 times daily, Remeron 45 mg at bedtime     GERD -Continue PPI   Chronic pain  -Outpatient on Suboxone, currently on Dilaudid PCA, as needed oxycodone  -Place Lidoderm patch for the lower back, air mattress   Tobacco abuse Patient reportedly smokes 2 packs of cigarettes per day on average. -Nicotine patch    Estimated body mass index is 19.01 kg/m as calculated from the following:   Height as of this encounter: 5' 5.5" (1.664 m).   Weight as of this encounter: 52.6 kg.  Code Status: Full code DVT Prophylaxis:  enoxaparin (LOVENOX) injection 40 mg Start: 08/17/22 2200 SCD's Start: 08/16/22 1754 SCDs Start: 08/16/22 1600   Level of Care: Level of care: Telemetry Surgical Family Communication: Updated patient's wife at the bedside   Disposition Plan:      Remains inpatient appropriate: Not stable for discharge   Procedures:  ex lap partial sigmoid colectomy, colostomy  Consultants:   General surgery  Antimicrobials:   Anti-infectives (From admission, onward)    Start  Dose/Rate Route Frequency Ordered Stop   08/17/22 1630  piperacillin-tazobactam (ZOSYN) IVPB 3.375 g        3.375 g 12.5 mL/hr over 240 Minutes Intravenous Every 8 hours 08/17/22 1540     08/16/22 1500  piperacillin-tazobactam (ZOSYN) IVPB 3.375 g        3.375 g 100 mL/hr over 30 Minutes Intravenous STAT 08/16/22 1446 08/16/22 2032   08/16/22 0615  piperacillin-tazobactam (ZOSYN) IVPB 3.375 g        3.375 g 100 mL/hr over 30 Minutes Intravenous  Once 08/16/22 0609 08/16/22 0713          Medications  Chlorhexidine Gluconate Cloth  6 each Topical Daily   enoxaparin (LOVENOX) injection  40 mg Subcutaneous Q24H   lidocaine  1 patch Transdermal Q24H    morphine   Intravenous Q4H   nicotine  21 mg Transdermal QHS   pantoprazole (PROTONIX) IV  40 mg Intravenous Q12H   phosphorus  500 mg Oral TID   sodium chloride flush  3 mL Intravenous Q12H   traZODone  150 mg Oral QHS      Subjective:   Lasaro Primm was seen and examined today.  States could not sleep last night, has still significant pain.  No ongoing fevers, nausea or vomiting.  No real bowel function and colostomy at  Objective:   Vitals:   08/21/22 0418 08/21/22 0819 08/21/22 1202 08/21/22 1254  BP: 132/71 113/66 135/75   Pulse: 90 98 100   Resp:  17 17 12   Temp: 98.4 F (36.9 C) 99 F (37.2 C) 98.8 F (37.1 C)   TempSrc: Oral Oral Oral   SpO2: 98% 97% 97% 94%  Weight:      Height:        Intake/Output Summary (Last 24 hours) at 08/21/2022 1345 Last data filed at 08/21/2022 1204 Gross per 24 hour  Intake 815.98 ml  Output 1400 ml  Net -584.02 ml     Wt Readings from Last 3 Encounters:  08/16/22 52.6 kg  07/27/22 53.5 kg  11/09/21 52.2 kg     Exam General: Alert and oriented x 3, NAD Cardiovascular: S1 S2 auscultated,  RRR Respiratory: Clear to auscultation bilaterally, no wheezing,  Gastrointestinal: Soft, dressing intact, colostomy with small thicker output but no overt stool Ext: no pedal edema bilaterally Neuro: Strength 5/5 upper and lower extremities bilaterally Psych: Normal affect and demeanor, alert and oriented x3     Data Reviewed:  I have personally reviewed following labs    CBC Lab Results  Component Value Date   WBC 15.4 (H) 08/21/2022   RBC 3.31 (L) 08/21/2022   HGB 10.3 (L) 08/21/2022   HCT 30.1 (L) 08/21/2022   MCV 90.9 08/21/2022   MCH 31.1 08/21/2022   PLT 187 08/21/2022   MCHC 34.2 08/21/2022   RDW 13.8 08/21/2022   LYMPHSABS 1.1 08/16/2022   MONOABS 1.4 (H) 08/16/2022   EOSABS 0.0 08/16/2022   BASOSABS 0.0 84/53/6468     Last metabolic panel Lab Results  Component Value Date   NA 132 (L) 08/21/2022   K 3.2  (L) 08/21/2022   CL 99 08/21/2022   CO2 26 08/21/2022   BUN <5 (L) 08/21/2022   CREATININE 0.49 (L) 08/21/2022   GLUCOSE 130 (H) 08/21/2022   GFRNONAA >60 08/21/2022   GFRAA 123 10/12/2020   CALCIUM 7.8 (L) 08/21/2022   PHOS 1.4 (L) 08/21/2022   PROT 5.4 (L) 08/21/2022   ALBUMIN 1.9 (L) 08/21/2022   LABGLOB  3.0 10/12/2020   AGRATIO 1.5 10/12/2020   BILITOT 0.7 08/21/2022   ALKPHOS 42 08/21/2022   AST 14 (L) 08/21/2022   ALT 10 08/21/2022   ANIONGAP 7 08/21/2022    CBG (last 3)  Recent Labs    08/20/22 2007 08/21/22 0817 08/21/22 1204  GLUCAP 132* 123* 126*      Coagulation Profile: No results for input(s): "INR", "PROTIME" in the last 168 hours.   Radiology Studies: I have personally reviewed the imaging studies  [place] EKG SITE RITE  Result Date: 08/20/2022 If Site Rite image not attached, placement could not be confirmed due to current cardiac rhythm.      Estill Cotta M.D. [facility] 08/21/2022, 1:45 PM  Available via Epic secure chat 7am-7pm After 7 pm, please refer to night coverage provider listed on amion.

## 2022-08-21 NOTE — Progress Notes (Signed)
Initial Nutrition Assessment  DOCUMENTATION CODES:   Severe malnutrition in context of chronic illness  INTERVENTION:   TPN management per pharmacy  NUTRITION DIAGNOSIS:   Severe Malnutrition related to chronic illness as evidenced by severe muscle depletion, severe fat depletion.  GOAL:   Patient will meet greater than or equal to 90% of their needs  MONITOR:   Diet advancement, Labs, I & O's, Weight trends  REASON FOR ASSESSMENT:   Consult New TPN/TNA  ASSESSMENT:   61 y.o. male presented to the ED with abdominal pain. PMH includes COPD, GERD, chronic pancreatitis, polysubstance abuse, and tobacco abuse. Pt admitted with sepsis 2/2 bowel perforation requiring emergent ex-lap.   9/15 - Ex-lap s/p partial sigmoid colectomy, colostomy 9/18 - NGT removed 9/29 - TPN started    Pt reports that he has had multiple bowel surgeries in the past, last one being in 1998. Reports that he has had 90% of his stomach and small bowel removed, part of his pancrease, and now part of his colon. Pt endorses episodes of dumping and constipation at home PTA. Denies any nausea or vomiting, just pain. States that his colostomy has developed some gas within the bag. Reports that feeling of having a bowel movement but is too much pain. Reports that he has always been 127# and then before he came in he was 118#. Shares that in the past, after each surgery he would lose significant amount of weight. Per EMR, pt has not lost any weight over the past 9 months.   Discussed possible need for long-term parenteral nutrition due to additional removal of GI tract. Pt expressed understanding. Encouraged pt to monitor for output out of bag and how much to assess for absorption issues. RD to discuss with team.   Medications reviewed and include: Protonix, Phosphorus, IV antibiotics, Potassium phosphate Labs reviewed: Sodium 132, Potassium 3.2, BUN <5, Creatinine 0.49, Phosphorus 1.4   NUTRITION - FOCUSED  PHYSICAL EXAM:  Flowsheet Row Most Recent Value  Orbital Region Severe depletion  Upper Arm Region Severe depletion  Thoracic and Lumbar Region Severe depletion  Buccal Region Severe depletion  Temple Region Moderate depletion  Clavicle Bone Region Severe depletion  Clavicle and Acromion Bone Region Severe depletion  Scapular Bone Region Severe depletion  Dorsal Hand Mild depletion  Patellar Region Unable to assess  Anterior Thigh Region Unable to assess  Posterior Calf Region Unable to assess  Edema (RD Assessment) None  Hair Reviewed  Eyes Reviewed  Mouth Reviewed  Skin Reviewed  Nails Reviewed   Diet Order:   Diet Order             Diet NPO time specified Except for: Sips with Meds  Diet effective now                   EDUCATION NEEDS:   No education needs have been identified at this time  Skin:  Skin Assessment: Reviewed RN Assessment  Last BM:  PTA  Height:   Ht Readings from Last 1 Encounters:  08/16/22 5' 5.5" (1.664 m)    Weight:   Wt Readings from Last 1 Encounters:  08/16/22 52.6 kg    Ideal Body Weight:  64.6 kg  BMI:  Body mass index is 19.01 kg/m.  Estimated Nutritional Needs:   Kcal:  1700-1900  Protein:  85-100 grams  Fluid:  >/= 1.7 L   Hermina Barters RD, LDN Clinical Dietitian See [facility] for contact information.

## 2022-08-22 DIAGNOSIS — K631 Perforation of intestine (nontraumatic): Secondary | ICD-10-CM | POA: Diagnosis not present

## 2022-08-22 DIAGNOSIS — M542 Cervicalgia: Secondary | ICD-10-CM | POA: Diagnosis not present

## 2022-08-22 DIAGNOSIS — F419 Anxiety disorder, unspecified: Secondary | ICD-10-CM | POA: Diagnosis not present

## 2022-08-22 DIAGNOSIS — E43 Unspecified severe protein-calorie malnutrition: Secondary | ICD-10-CM | POA: Insufficient documentation

## 2022-08-22 DIAGNOSIS — J410 Simple chronic bronchitis: Secondary | ICD-10-CM | POA: Diagnosis not present

## 2022-08-22 LAB — COMPREHENSIVE METABOLIC PANEL
ALT: 10 U/L (ref 0–44)
AST: 15 U/L (ref 15–41)
Albumin: 1.9 g/dL — ABNORMAL LOW (ref 3.5–5.0)
Alkaline Phosphatase: 47 U/L (ref 38–126)
Anion gap: 9 (ref 5–15)
BUN: 8 mg/dL (ref 6–20)
CO2: 26 mmol/L (ref 22–32)
Calcium: 8.2 mg/dL — ABNORMAL LOW (ref 8.9–10.3)
Chloride: 96 mmol/L — ABNORMAL LOW (ref 98–111)
Creatinine, Ser: 0.49 mg/dL — ABNORMAL LOW (ref 0.61–1.24)
GFR, Estimated: >60 mL/min (ref >60)
Glucose, Bld: 119 mg/dL — ABNORMAL HIGH (ref 70–99)
Potassium: 3.9 mmol/L (ref 3.5–5.1)
Sodium: 131 mmol/L — ABNORMAL LOW (ref 135–145)
Total Bilirubin: 0.6 mg/dL (ref 0.3–1.2)
Total Protein: 5.6 g/dL — ABNORMAL LOW (ref 6.5–8.1)

## 2022-08-22 LAB — CBC
HCT: 29.6 % — ABNORMAL LOW (ref 39.0–52.0)
Hemoglobin: 10.2 g/dL — ABNORMAL LOW (ref 13.0–17.0)
MCH: 31.5 pg (ref 26.0–34.0)
MCHC: 34.5 g/dL (ref 30.0–36.0)
MCV: 91.4 fL (ref 80.0–100.0)
Platelets: 217 10*3/uL (ref 150–400)
RBC: 3.24 MIL/uL — ABNORMAL LOW (ref 4.22–5.81)
RDW: 14 % (ref 11.5–15.5)
WBC: 15.9 10*3/uL — ABNORMAL HIGH (ref 4.0–10.5)
nRBC: 0 % (ref 0.0–0.2)

## 2022-08-22 LAB — GLUCOSE, CAPILLARY
Glucose-Capillary: 124 mg/dL — ABNORMAL HIGH (ref 70–99)
Glucose-Capillary: 157 mg/dL — ABNORMAL HIGH (ref 70–99)

## 2022-08-22 LAB — PHOSPHORUS: Phosphorus: 3 mg/dL (ref 2.5–4.6)

## 2022-08-22 LAB — TRIGLYCERIDES: Triglycerides: 119 mg/dL (ref <150)

## 2022-08-22 LAB — MAGNESIUM: Magnesium: 1.7 mg/dL (ref 1.7–2.4)

## 2022-08-22 MED ORDER — TRAVASOL 10 % IV SOLN
INTRAVENOUS | Status: AC
  Filled 2022-08-22: qty 840

## 2022-08-22 MED ORDER — OXYCODONE HCL 5 MG PO TABS
5.0000 mg | ORAL_TABLET | Freq: Three times a day (TID) | ORAL | Status: DC | PRN
  Administered 2022-08-22 – 2022-08-24 (×5): 10 mg via ORAL
  Filled 2022-08-22 (×5): qty 2

## 2022-08-22 MED ORDER — OXYCODONE HCL 5 MG PO TABS
5.0000 mg | ORAL_TABLET | ORAL | Status: DC | PRN
  Administered 2022-08-22: 10 mg via ORAL
  Filled 2022-08-22: qty 2

## 2022-08-22 MED ORDER — TIZANIDINE HCL 2 MG PO TABS
2.0000 mg | ORAL_TABLET | Freq: Three times a day (TID) | ORAL | Status: DC
  Administered 2022-08-22 – 2022-09-06 (×46): 2 mg via ORAL
  Filled 2022-08-22 (×46): qty 1

## 2022-08-22 MED ORDER — ACETAMINOPHEN 500 MG PO TABS
1000.0000 mg | ORAL_TABLET | Freq: Four times a day (QID) | ORAL | Status: DC
  Administered 2022-08-22 – 2022-08-27 (×18): 1000 mg via ORAL
  Administered 2022-09-05: 500 mg via ORAL
  Filled 2022-08-22 (×32): qty 2

## 2022-08-22 NOTE — Progress Notes (Signed)
Pharmacy Antibiotic Note  Jerry Knight is a 61 y.o. male admitted on 08/16/2022 with abdominal pain. Zosyn given in ED on 08/16/22.  Pharmacy has been consulted on 9/16 for Zosyn dosing for intra-abdominal infection.    Plan: Zosyn 3.375g Q8H (4 hour infusion) Trend WBC, fever, renal function F/u cultures, clinical progress, levels as indicated De-escalate when able    Height: 5' 5.5" (166.4 cm) Weight: 52.6 kg (116 lb) IBW/kg (Calculated) : 62.65  Temp (24hrs), Avg:99.1 F (37.3 C), Min:98.4 F (36.9 C), Max:99.8 F (37.7 C)  Recent Labs  Lab 08/16/22 0506 08/16/22 1820 08/16/22 2138 08/17/22 0048 08/18/22 0017 08/19/22 0035 08/20/22 0040 08/21/22 0307 08/22/22 0418  WBC 22.4*  --   --   --  17.5* 14.2* 13.3* 15.4* 15.9*  CREATININE 0.82  --   --    < > 0.70 0.76 0.62 0.49* 0.49*  LATICACIDVEN 3.1* 3.8* 3.1*  --   --   --   --   --   --    < > = values in this interval not displayed.    Estimated Creatinine Clearance: 74 mL/min (A) (by C-G formula based on SCr of 0.49 mg/dL (L)).    Allergies  Allergen Reactions   Ambien [Zolpidem Tartrate] Other (See Comments)    Black out   Iohexol     Pt claims he has chest tightness and sob after iv contrast injections.   Lunesta [Eszopiclone] Other (See Comments)   Seroquel [Quetiapine Fumarate]     Blackout   Sonata [Zaleplon]     "black out'  confused    Antimicrobials this admission: Zosyn  9/15 x1; 9/16 >>   Dose adjustments this admission:   Microbiology results: 9/15 BCx: ngtd <24 hr    Thank you for allowing pharmacy to be a part of this patient's care.  Nicole Cella, RPh Clinical Pharmacist  08/22/2022 11:29 AM Please check AMION for all [place] phone numbers After 10:00 PM, call [place] [phone]

## 2022-08-22 NOTE — Evaluation (Signed)
Physical Therapy Evaluation Patient Details Name: Jerry Knight MRN: [id number] DOB: [birth year] Today's Date: 08/22/2022  History of Present Illness  Pt is a 61 y/o male admitted secondary to bowel perforation. Pt is s/p emergent exploratory laparotomy, left colostomy and partial sigmoid colectomy on 9/15. PMH includes RA, COPD, and seizures.  Clinical Impression  Pt admitted secondary to problem above with deficits below. Pt limited in tolerance secondary to pain. Did stand and take a few steps in room to get lines detangled. Educated about importance of mobility, bracing technique, IS use, and log roll technique to help with pain management. Anticipate pt will progress well and will not require PT follow up. Will continue to follow acutely.        Recommendations for follow up therapy are one component of a multi-disciplinary discharge planning process, led by the attending physician.  Recommendations may be updated based on patient status, additional functional criteria and insurance authorization.  Follow Up Recommendations No PT follow up      Assistance Recommended at Discharge Intermittent Supervision/Assistance  Patient can return home with the following  A little help with bathing/dressing/bathroom;Assistance with cooking/housework;Assist for transportation    Equipment Recommendations None recommended by PT  Recommendations for Other Services       Functional Status Assessment Patient has had a recent decline in their functional status and demonstrates the ability to make significant improvements in function in a reasonable and predictable amount of time.     Precautions / Restrictions Precautions Precautions: Fall Restrictions Weight Bearing Restrictions: No      Mobility  Bed Mobility               General bed mobility comments: In chair upon entry    Transfers Overall transfer level: Needs assistance Equipment used: None Transfers: Sit to/from  Stand Sit to Stand: Supervision           General transfer comment: supervision for safety. Able to take a few steps at chair to get untangled from lines with supervision. Pt requesting to defer further mobility secondary to pain.    Ambulation/Gait                  Stairs            Wheelchair Mobility    Modified Rankin (Stroke Patients Only)       Balance Overall balance assessment: Needs assistance Sitting-balance support: No upper extremity supported, Feet supported Sitting balance-Leahy Scale: Good     Standing balance support: No upper extremity supported, During functional activity Standing balance-Leahy Scale: Fair                               Pertinent Vitals/Pain Pain Assessment Pain Assessment: Faces Faces Pain Scale: Hurts even more Pain Location: abdomen Pain Descriptors / Indicators: Grimacing, Guarding Pain Intervention(s): Limited activity within patient's tolerance, Monitored during session, Repositioned    Home Living Family/patient expects to be discharged to:: Private residence Living Arrangements: Spouse/significant other Available Help at Discharge: Family Type of Home: House Home Access: Stairs to enter Entrance Stairs-Rails: Right Entrance Stairs-Number of Steps: 2   Home Layout: One level Home Equipment: Conservation officer, nature (2 wheels);Cane - single point      Prior Function Prior Level of Function : Independent/Modified Independent                     Hand Dominance  Extremity/Trunk Assessment   Upper Extremity Assessment Upper Extremity Assessment: Overall WFL for tasks assessed    Lower Extremity Assessment Lower Extremity Assessment: Generalized weakness    Cervical / Trunk Assessment Cervical / Trunk Assessment: Other exceptions Cervical / Trunk Exceptions: s/p abdominal surgery  Communication   Communication: No difficulties  Cognition Arousal/Alertness: Awake/alert Behavior  During Therapy: WFL for tasks assessed/performed Overall Cognitive Status: Within Functional Limits for tasks assessed                                          General Comments General comments (skin integrity, edema, etc.): Educated about abdominal bracing, log roll for bed mobility, and IS use    Exercises     Assessment/Plan    PT Assessment Patient needs continued PT services  PT Problem List Decreased activity tolerance;Decreased mobility;Pain       PT Treatment Interventions Gait training;Functional mobility training;Stair training;Therapeutic activities;Therapeutic exercise;Balance training;Patient/family education    PT Goals (Current goals can be found in the Care Plan section)  Acute Rehab PT Goals Patient Stated Goal: to feel better and go home PT Goal Formulation: With patient Time For Goal Achievement: 09/05/22 Potential to Achieve Goals: Good    Frequency Min 3X/week     Co-evaluation               AM-PAC PT "6 Clicks" Mobility  Outcome Measure Help needed turning from your back to your side while in a flat bed without using bedrails?: A Little Help needed moving from lying on your back to sitting on the side of a flat bed without using bedrails?: A Little Help needed moving to and from a bed to a chair (including a wheelchair)?: A Little Help needed standing up from a chair using your arms (e.g., wheelchair or bedside chair)?: A Little Help needed to walk in hospital room?: A Little Help needed climbing 3-5 steps with a railing? : A Little 6 Click Score: 18    End of Session Equipment Utilized During Treatment: Gait belt Activity Tolerance: Patient limited by pain Patient left: in chair;with call bell/phone within reach;with family/visitor present Nurse Communication: Mobility status PT Visit Diagnosis: Other abnormalities of gait and mobility (R26.89)    Time: 0174-9449 PT Time Calculation (min) (ACUTE ONLY): 26  min   Charges:   PT Evaluation $PT Eval Moderate Complexity: 1 Mod PT Treatments $Therapeutic Activity: 8-22 mins        Lou Miner, DPT  Acute Rehabilitation Services  Office: [phone]   Rudean Hitt 08/22/2022, 4:34 PM

## 2022-08-22 NOTE — Progress Notes (Signed)
Progress Note  6 Days Post-Op  Subjective: Only with small amount of thin liquid out of colostomy. No gas. Pain is overall well controlled but he is needing PO oxycodone in addition to PCA to have good pain control - especially at night. No nausea or emesis.   Objective: Vital signs in last 24 hours: Temp:  [98.4 F (36.9 C)-99.8 F (37.7 C)] 99.4 F (37.4 C) (09/21 0804) Pulse Rate:  [97-109] 105 (09/21 0804) Resp:  [12-18] 18 (09/21 0804) BP: (108-141)/(64-103) 141/103 (09/21 0804) SpO2:  [94 %-98 %] 95 % (09/21 0804) FiO2 (%):  [29 %] 29 % (09/21 0317)    Intake/Output from previous day: 09/20 0701 - 09/21 0700 In: 1053.5 [P.O.:240; I.V.:813.5] Out: 2125 [Urine:2125] Intake/Output this shift: Total I/O In: -  Out: 575 [Urine:575]  PE: General: pleasant, WD, male who is laying in bed in NAD Heart: regular rate. Regular rhythm Lungs: Respiratory effort nonlabored Abd: soft, ND, colostomy with thin SS output this morning - no gas or overt stool. Stoma pink and viable. Drain with SS output. Midline wound with dressing changed by me this am. Mild surrounding erythema. No induration or purulent drainage. See pic below Skin: warm and dry Psych: A&Ox3 with an appropriate affect.          Lab Results:  Recent Labs    08/21/22 0307 08/22/22 0418  WBC 15.4* 15.9*  HGB 10.3* 10.2*  HCT 30.1* 29.6*  PLT 187 217    BMET Recent Labs    08/21/22 0307 08/22/22 0418  NA 132* 131*  K 3.2* 3.9  CL 99 96*  CO2 26 26  GLUCOSE 130* 119*  BUN <5* 8  CREATININE 0.49* 0.49*  CALCIUM 7.8* 8.2*    PT/INR No results for input(s): "LABPROT", "INR" in the last 72 hours. CMP     Component Value Date/Time   NA 131 (L) 08/22/2022 0418   NA 140 10/12/2020 1526   K 3.9 08/22/2022 0418   CL 96 (L) 08/22/2022 0418   CO2 26 08/22/2022 0418   GLUCOSE 119 (H) 08/22/2022 0418   BUN 8 08/22/2022 0418   BUN 10 10/12/2020 1526   CREATININE 0.49 (L) 08/22/2022 0418    CREATININE 0.77 01/27/2013 1105   CALCIUM 8.2 (L) 08/22/2022 0418   PROT 5.6 (L) 08/22/2022 0418   PROT 7.5 10/12/2020 1526   ALBUMIN 1.9 (L) 08/22/2022 0418   ALBUMIN 4.5 10/12/2020 1526   AST 15 08/22/2022 0418   ALT 10 08/22/2022 0418   ALKPHOS 47 08/22/2022 0418   BILITOT 0.6 08/22/2022 0418   BILITOT 0.3 10/12/2020 1526   GFRNONAA >60 08/22/2022 0418   GFRAA 123 10/12/2020 1526   Lipase     Component Value Date/Time   LIPASE 39 08/16/2022 0506       Studies/Results: [place] EKG SITE RITE  Result Date: 08/20/2022 If Site Rite image not attached, placement could not be confirmed due to current cardiac rhythm.   Anti-infectives: Anti-infectives (From admission, onward)    Start     Dose/Rate Route Frequency Ordered Stop   08/17/22 1630  piperacillin-tazobactam (ZOSYN) IVPB 3.375 g        3.375 g 12.5 mL/hr over 240 Minutes Intravenous Every 8 hours 08/17/22 1540     08/16/22 1500  piperacillin-tazobactam (ZOSYN) IVPB 3.375 g        3.375 g 100 mL/hr over 30 Minutes Intravenous STAT 08/16/22 1446 08/16/22 2032   08/16/22 0615  piperacillin-tazobactam (ZOSYN) IVPB 3.375 g  3.375 g 100 mL/hr over 30 Minutes Intravenous  Once 08/16/22 3845 08/16/22 3646        Assessment/Plan Perforated stercoral ulcer POD 6 ex lap partial sigmoid colectomy, colostomy - history of chronic pain on suboxone. Continue PCA. Baseline flexeril added by primary. Okay for po oxycodone and will continue to adjust pain medications once able to take in more PO - NGT out overnight 9/17-9/18- no n/v this am. Replace NGT for nausea/emesis - start CLD today. TPN started 9/19 pm - WBC 15.9 (15.4), afebrile, tachycardic - continue IV zosyn and consider CT scan in the next few days if not improving - drain SS - continue drain - WTD dressing changes to midline wound - WOC RN following for new ostomy - encourage ambulation and IS use   FEN - CLD, IVFs, TPN VTE - SCDs, lovenox ID - zosyn  9/14> Foley - out 9/18. voiding   Per primary Tobacco abuse/COPD - 2 ppd H/O substance abuse H/O opioid dependence - now on suboxone prn H/O seizure d/o Chronic pancreatitis Zollinger-Ellison syndrome GERD Hypoalbuminemic - appears malnourished at baseline due to muscle wasting RA - mtx and enbrel held   LOS: 6 days   Winferd Humphrey, [facility] 08/22/2022, 9:46 AM Please see Amion for pager number during day hours 7:00am-4:30pm

## 2022-08-22 NOTE — Progress Notes (Signed)
Pt requesting prn oxycodone, order specifies to give in place of PCA not in addition to, for break through pain. Hollace Hayward, NP contacted for clarification of order. Order discontinued and pt started on scheduled tylenol, however pt refused. States "I will just get it straightened out with my doctor". Given prn flexeril and trazodone at hs with positive effects noted.

## 2022-08-22 NOTE — Progress Notes (Addendum)
PHARMACY - TOTAL PARENTERAL NUTRITION CONSULT NOTE   Indication: Prolonged ileus  Patient Measurements: Height: 5' 5.5" (166.4 cm) Weight: 52.6 kg (116 lb) IBW/kg (Calculated) : 62.65 TPN AdjBW (KG): 52.6 Body mass index is 19.01 kg/m.  Usual Weight: 115 lbs  Assessment:  61 yo male with a history of chronic pain syndrome on Suboxone, chronic pancreatitis with prior surgery for pseudocyst removal in the 1990s, prior BII in the 1980s, COPD, rheumatoid arthritis on Enbrel, history of seizures not on an AED, and Zollinger-Ellison syndrome who presented with acute onset of abdominal pain along with N/V. He was found to have a WBC of 22K, lactic acid of 3.1 and a CT scan concerning for a colonic perforation. Pharmacy consulted to initiate TPN for prolonged ileus.  Glucose / Insulin: no hx DM, CBGs 123-132; x1 decadron dose 9/15 Electrolytes: K 3.4, Na 131, CO2 21, CoCa 8.8, others wnl Renal: Scr 0.62 (bsl < 1); BUN wnl Hepatic: (9/16) LFTs wnl, Tbili 1.3, Alb 2.4 Intake / Output; MIVF: UOP 1.4 ml/kg/hr; Drain at 20 mL; no emesis x2 days  GI Imaging: 9/15 colonic perforation in the region of the sigmoid colon likely secondary to impacted stool GI Surgeries / Procedures:  9/15 Exploratory laparotomy, Left colostomy and partial sigmoid colectomy   Central access: 08/20/22 TPN start date:  08/20/22  Nutritional Goals: Goal TPN rate is 70 mL/hr (provides 84 g of protein and 1690 kcals per day)  RD Assessment: Estimated Needs Total Energy Estimated Needs: 1700-1900 Total Protein Estimated Needs: 85-100 grams Total Fluid Estimated Needs: >/= 1.7 L   Current Nutrition:  9/19 NPO 9/21 CLD  Plan:  Continue TPN at full rate of 62m/hr (100% goal rate) at 1800 to provide 84g AA and 1690 kcal Electrolytes in TPN: increase Na to 60 mEq/L, K 560m/L, Ca 2m27mL, increase Mg to 8 mEq/L, Phos 12m62mL. Cl:Ac 1:1 Add standard MVI and trace elements to TPN Monitor CBGs, initiate SSI when needed    Continue MIVF @ 10 mL/hr Monitor TPN labs on Mon/Thurs, as needed   Thank you for allowing pharmacy to be a part of this patient's care.  AdriArdyth HarpsarmD Clinical Pharmacist

## 2022-08-22 NOTE — Progress Notes (Signed)
[facility]                                                                              [name], is a 61 y.o. male, DOB - November 02, 1961, [id number] Admit date - 08/16/2022    Outpatient Primary MD for the patient is Irwin Brakeman, [name], [place]  LOS - 6  days  Chief Complaint  Patient presents with   Abdominal Pain       Brief summary   Jerry Knight is a 61 y.o. male with medical history significant of  COPD, chronic pancreatitis, rheumatoid arthritis on Enbrel, history of seizures not on an AED, chronic pain on Suboxone, polysubstance abuse, Zollinger-Ellison syndrome, and constipation who presented with complaints of  lower abdominal pain.  Imaging remarkable for colonic perforation/obstruction  Patient was admitted for further work-up  Assessment & Plan    Principal Problem: Sepsis secondary to bowel perforation ([place]) due to stercoral ulcer -CT revealed colonic perforation in the region of the sigmoid colon thought secondary to impacted stool.  -Surgery consulted, patient underwent emergent exploratory laparotomy, left colostomy and partial sigmoid colectomy on 9/15 -Met sepsis criteria on admission with tachycardia, tachypnea, leukocytosis, lactic acidosis -Continue Zosyn, n.p.o. with ostomy, TPN.  NGT was removed on 9/18 -WBCs trending up, per surgery may need to repeat CT -Okay to continue as needed low-dose oxycodone for breakthrough pain, wean PCA as tolerated. -Added tizanidine, takes 3 times daily at home -General surgery following, started on clear liquid diet today   Normocytic anemia -Likely anemia of chronic disease -H&H stable  Hypokalemia, hyponatremia -K normal  -Continue TPN     COPD, without exacerbation - Chest x-ray noted chronic interstitial lung changes with bibasilar atelectasis. - Albuterol nebs as needed   History of rheumatoid arthritis -Outpatient on Enbrel 50 mg and methotrexate 10 mg on Saturdays -Currently  regimen on hold  Insomnia, anxiety -For now continue Ativan as needed for anxiety -Continue trazodone 150 mg at bedtime    GERD -Continue PPI   Chronic pain  -Outpatient on Suboxone, currently on Dilaudid PCA, as needed oxycodone  -continue Lidoderm patch for the lower back - ordered air mattress   Tobacco abuse Patient reportedly smokes 2 packs of cigarettes per day on average. - cont Nicotine patch    Estimated body mass index is 19.01 kg/m as calculated from the following:   Height as of this encounter: 5' 5.5" (1.664 m).   Weight as of this encounter: 52.6 kg.  Code Status: Full code DVT Prophylaxis:  enoxaparin (LOVENOX) injection 40 mg Start: 08/17/22 2200 SCD's Start: 08/16/22 1754 SCDs Start: 08/16/22 1600   Level of Care: Level of care: Telemetry Surgical Family Communication: Updated patient's wife at the bedside yesterday. D/w pt today    Disposition Plan:      Remains inpatient appropriate: Not stable for discharge   Procedures:  ex lap partial sigmoid colectomy, colostomy  Consultants:   General surgery  Antimicrobials:   Anti-infectives (From admission, onward)    Start     Dose/Rate Route Frequency Ordered Stop   08/17/22 1630  piperacillin-tazobactam (ZOSYN) IVPB 3.375 g  3.375 g 12.5 mL/hr over 240 Minutes Intravenous Every 8 hours 08/17/22 1540     08/16/22 1500  piperacillin-tazobactam (ZOSYN) IVPB 3.375 g        3.375 g 100 mL/hr over 30 Minutes Intravenous STAT 08/16/22 1446 08/16/22 2032   08/16/22 0615  piperacillin-tazobactam (ZOSYN) IVPB 3.375 g        3.375 g 100 mL/hr over 30 Minutes Intravenous  Once 08/16/22 0609 08/16/22 0713          Medications  acetaminophen  1,000 mg Oral Q6H   Chlorhexidine Gluconate Cloth  6 each Topical Daily   enoxaparin (LOVENOX) injection  40 mg Subcutaneous Q24H   lidocaine  1 patch Transdermal Q24H   morphine   Intravenous Q4H   nicotine  21 mg Transdermal QHS   pantoprazole  (PROTONIX) IV  40 mg Intravenous Q12H   sodium chloride flush  3 mL Intravenous Q12H   tiZANidine  2 mg Oral Q8H   traZODone  150 mg Oral QHS      Subjective:   Jerry Knight was seen and examined today.  Overnight had issues with pain, his oxycodone was discontinued.  Slept somewhat better with trazodone.  Still no formed stools in colostomy bag.  Objective:   Vitals:   08/22/22 0528 08/22/22 0804 08/22/22 1117 08/22/22 1205  BP: 108/64 (!) 141/103  107/76  Pulse: 97 (!) 105  (!) 105  Resp: 15 18 16 16  Temp: 98.4 F (36.9 C) 99.4 F (37.4 C)  98.5 F (36.9 C)  TempSrc: Oral Oral  Oral  SpO2: 95% 95% 97% 96%  Weight:      Height:        Intake/Output Summary (Last 24 hours) at 08/22/2022 1238 Last data filed at 08/22/2022 0808 Gross per 24 hour  Intake 1053.46 ml  Output 2275 ml  Net -1221.54 ml     Wt Readings from Last 3 Encounters:  08/16/22 52.6 kg  07/27/22 53.5 kg  11/09/21 52.2 kg   Physical Exam General: Alert and oriented x 3, NAD Cardiovascular: S1 S2 clear, RRR.  Respiratory: CTAB, no wheezing, rales  Gastrointestinal: Soft, dressing intact, colostomy with small output, no formed stools. + drain Ext: no pedal edema bilaterally Neuro: no new deficits Psych: Normal affect and demeanor, alert and oriented x3    Data Reviewed:  I have personally reviewed following labs    CBC Lab Results  Component Value Date   WBC 15.9 (H) 08/22/2022   RBC 3.24 (L) 08/22/2022   HGB 10.2 (L) 08/22/2022   HCT 29.6 (L) 08/22/2022   MCV 91.4 08/22/2022   MCH 31.5 08/22/2022   PLT 217 08/22/2022   MCHC 34.5 08/22/2022   RDW 14.0 08/22/2022   LYMPHSABS 1.1 08/16/2022   MONOABS 1.4 (H) 08/16/2022   EOSABS 0.0 08/16/2022   BASOSABS 0.0 08/16/2022     Last metabolic panel Lab Results  Component Value Date   NA 131 (L) 08/22/2022   K 3.9 08/22/2022   CL 96 (L) 08/22/2022   CO2 26 08/22/2022   BUN 8 08/22/2022   CREATININE 0.49 (L) 08/22/2022   GLUCOSE  119 (H) 08/22/2022   GFRNONAA >60 08/22/2022   GFRAA 123 10/12/2020   CALCIUM 8.2 (L) 08/22/2022   PHOS 3.0 08/22/2022   PROT 5.6 (L) 08/22/2022   ALBUMIN 1.9 (L) 08/22/2022   LABGLOB 3.0 10/12/2020   AGRATIO 1.5 10/12/2020   BILITOT 0.6 08/22/2022   ALKPHOS 47 08/22/2022   AST 15 08/22/2022     ALT 10 08/22/2022   ANIONGAP 9 08/22/2022    CBG (last 3)  Recent Labs    08/21/22 1204 08/21/22 2104 08/22/22 0805  GLUCAP 126* 145* 124*      Coagulation Profile: No results for input(s): "INR", "PROTIME" in the last 168 hours.   Radiology Studies: I have personally reviewed the imaging studies  No results found.       M.D. [facility] 08/22/2022, 12:38 PM  Available via Epic secure chat 7am-7pm After 7 pm, please refer to night coverage provider listed on amion.    

## 2022-08-22 NOTE — Plan of Care (Signed)
  Problem: Education: Goal: Knowledge of General Education information will improve Description Including pain rating scale, medication(s)/side effects and non-pharmacologic comfort measures Outcome: Progressing   Problem: Health Behavior/Discharge Planning: Goal: Ability to manage health-related needs will improve Outcome: Progressing   

## 2022-08-23 ENCOUNTER — Inpatient Hospital Stay (HOSPITAL_COMMUNITY): Payer: Medicare HMO

## 2022-08-23 DIAGNOSIS — M542 Cervicalgia: Secondary | ICD-10-CM | POA: Diagnosis not present

## 2022-08-23 DIAGNOSIS — K2100 Gastro-esophageal reflux disease with esophagitis, without bleeding: Secondary | ICD-10-CM

## 2022-08-23 DIAGNOSIS — K631 Perforation of intestine (nontraumatic): Secondary | ICD-10-CM | POA: Diagnosis not present

## 2022-08-23 DIAGNOSIS — F419 Anxiety disorder, unspecified: Secondary | ICD-10-CM | POA: Diagnosis not present

## 2022-08-23 DIAGNOSIS — J410 Simple chronic bronchitis: Secondary | ICD-10-CM | POA: Diagnosis not present

## 2022-08-23 LAB — BASIC METABOLIC PANEL
Anion gap: 8 (ref 5–15)
BUN: 10 mg/dL (ref 6–20)
CO2: 25 mmol/L (ref 22–32)
Calcium: 8.1 mg/dL — ABNORMAL LOW (ref 8.9–10.3)
Chloride: 99 mmol/L (ref 98–111)
Creatinine, Ser: 0.51 mg/dL — ABNORMAL LOW (ref 0.61–1.24)
GFR, Estimated: >60 mL/min (ref >60)
Glucose, Bld: 143 mg/dL — ABNORMAL HIGH (ref 70–99)
Potassium: 4 mmol/L (ref 3.5–5.1)
Sodium: 132 mmol/L — ABNORMAL LOW (ref 135–145)

## 2022-08-23 LAB — GLUCOSE, CAPILLARY
Glucose-Capillary: 142 mg/dL — ABNORMAL HIGH (ref 70–99)
Glucose-Capillary: 124 mg/dL — ABNORMAL HIGH (ref 70–99)

## 2022-08-23 MED ORDER — TRAVASOL 10 % IV SOLN
INTRAVENOUS | Status: AC
  Filled 2022-08-23: qty 873.6

## 2022-08-23 NOTE — Progress Notes (Signed)
PHARMACY - TOTAL PARENTERAL NUTRITION CONSULT NOTE   Indication: Prolonged ileus  Patient Measurements: Height: 5' 5.5" (166.4 cm) Weight: 52.6 kg (116 lb) IBW/kg (Calculated) : 62.65 TPN AdjBW (KG): 52.6 Body mass index is 19.01 kg/m.  Usual Weight: 115 lbs  Assessment:  61 yo male with a history of chronic pain syndrome on Suboxone, chronic pancreatitis with prior surgery for pseudocyst removal in the 1990s, prior BII in the 1980s, COPD, rheumatoid arthritis on Enbrel, history of seizures not on an AED, and Zollinger-Ellison syndrome who presented with acute onset of abdominal pain along with N/V. He was found to have a WBC of 22K, lactic acid of 3.1 and a CT scan concerning for a colonic perforation. Pharmacy consulted to initiate TPN for prolonged ileus.  Glucose / Insulin: no hx DM, CBGs 124-157; x1 decadron dose 9/15 Electrolytes: Na 132, K 4.0, Cl 99, CoCa 9.7, Other electrolytes wnl.  Renal: Scr 0.62 (bsl < 1); BUN wnl Hepatic: LFTs wnl. Albumin 1.9. TG 119.  Intake / Output; MIVF: UOP 1.5 ml/kg/hr; no drain output recorded. 200 ml stool.   GI Imaging: 9/15 colonic perforation in the region of the sigmoid colon likely secondary to impacted stool GI Surgeries / Procedures:  9/15 Exploratory laparotomy, Left colostomy and partial sigmoid colectomy   Central access: 08/20/22 TPN start date:  08/20/22  Nutritional Goals: Goal TPN rate is 70 mL/hr (provides 84 g of protein and 1690 kcals per day)  RD Assessment: Estimated Needs Total Energy Estimated Needs: 1700-1900 Total Protein Estimated Needs: 85-100 grams Total Fluid Estimated Needs: >/= 1.7 L   Current Nutrition:  9/19 NPO 9/21 CLD  Plan:  Continue TPN at full rate of 57m/hr (100% goal rate) at 1800 to provide 87g AA and 1703 kcal Electrolytes in TPN: Increase Na to 100 mEq/L. Continue K 50 mEq/L, Ca 5 mEq/L, Mg 8 mEq/L, phos 15 mmol/L. Continue Cl:Ac 1:1.  Add standard MVI and trace elements to TPN Monitor  CBGs - continue off SSI since CBGs controlled  Monitor TPN labs on Mon/Thurs, as needed Follow up advancement of diet    Thank you for allowing pharmacy to be a part of this patient's care.  GCristela Felt PharmD, BCPS Clinical Pharmacist 08/23/2022 7:36 AM

## 2022-08-23 NOTE — Progress Notes (Signed)
[facility]                                                                              Jerry Knight, is a 61 y.o. male, DOB - Oct 10, 1961, [id number] Admit date - 08/16/2022    Outpatient Primary MD for the patient is Irwin Brakeman, [name], [place]  LOS - 7  days  Chief Complaint  Patient presents with   Abdominal Pain       Brief summary   Jerry Knight is a 61 y.o. male with medical history significant of  COPD, chronic pancreatitis, rheumatoid arthritis on Enbrel, history of seizures not on an AED, chronic pain on Suboxone, polysubstance abuse, Zollinger-Ellison syndrome, and constipation who presented with complaints of  lower abdominal pain.  Imaging remarkable for colonic perforation/obstruction  Patient was admitted for further work-up  Assessment & Plan    Principal Problem: Sepsis secondary to bowel perforation ([place]) due to stercoral ulcer -CT revealed colonic perforation in the region of the sigmoid colon thought secondary to impacted stool.  -Met sepsis criteria on admission with tachycardia, tachypnea, leukocytosis, lactic acidosis -underwent emergent exploratory laparotomy, left colostomy and partial sigmoid colectomy on 9/15 -Continue Zosyn, n.p.o. with ostomy, TPN.  NGT was removed on 9/18 -Surgery following closely, WBCs 15.9, continue IV Zosyn, afebrile -Continue clear liquid diet, started 9/21, continue TPN -Continue morphine PCA, oxycodone for breakthrough pain, still complaining of abdominal pain, states tizanidine helped yesterday  Normocytic anemia -Likely anemia of chronic disease -H&H stable  Hypokalemia, hyponatremia -K normal  -Continue TPN    COPD, without exacerbation - Chest x-ray noted chronic interstitial lung changes with bibasilar atelectasis. - Albuterol nebs as needed   History of rheumatoid arthritis -Outpatient on Enbrel 50 mg and methotrexate 10 mg on Saturdays -Currently regimen on hold  Insomnia,  anxiety -For now continue Ativan as needed for anxiety -Continue trazodone 150 mg at bedtime    GERD -Continue PPI   Chronic pain  -Outpatient on Suboxone, currently on PCA and as needed oxycodone, tizanidine -continue Lidoderm patch for the lower back   Tobacco abuse Patient reportedly smokes 2 packs of cigarettes per day on average. - cont Nicotine patch  Moderate to severe protein calorie malnutrition due to acute illness, hypoalbuminemia 1.9 Estimated body mass index is 19.01 kg/m as calculated from the following:   Height as of this encounter: 5' 5.5" (1.664 m).   Weight as of this encounter: 52.6 kg. -Currently on TPN  Code Status: Full code DVT Prophylaxis:  enoxaparin (LOVENOX) injection 40 mg Start: 08/17/22 2200 SCD's Start: 08/16/22 1754 SCDs Start: 08/16/22 1600   Level of Care: Level of care: Telemetry Surgical Family Communication: Updated patient's wife at the bedside 9/20, D/w pt today, alert and oriented  Disposition Plan:      Remains inpatient appropriate: Not stable for discharge   Procedures:  ex lap partial sigmoid colectomy, colostomy  Consultants:   General surgery  Antimicrobials:   Anti-infectives (From admission, onward)    Start     Dose/Rate Route Frequency Ordered Stop   08/17/22 1630  piperacillin-tazobactam (ZOSYN) IVPB 3.375 g  3.375 g 12.5 mL/hr over 240 Minutes Intravenous Every 8 hours 08/17/22 1540     08/16/22 1500  piperacillin-tazobactam (ZOSYN) IVPB 3.375 g        3.375 g 100 mL/hr over 30 Minutes Intravenous STAT 08/16/22 1446 08/16/22 2032   08/16/22 0615  piperacillin-tazobactam (ZOSYN) IVPB 3.375 g        3.375 g 100 mL/hr over 30 Minutes Intravenous  Once 08/16/22 0609 08/16/22 0713          Medications  acetaminophen  1,000 mg Oral Q6H   Chlorhexidine Gluconate Cloth  6 each Topical Daily   enoxaparin (LOVENOX) injection  40 mg Subcutaneous Q24H   lidocaine  1 patch Transdermal Q24H   morphine    Intravenous Q4H   nicotine  21 mg Transdermal QHS   pantoprazole (PROTONIX) IV  40 mg Intravenous Q12H   sodium chloride flush  3 mL Intravenous Q12H   tiZANidine  2 mg Oral Q8H   traZODone  150 mg Oral QHS      Subjective:   Jerry Knight was seen and examined today.  Started on clears yesterday, no increased nausea or vomiting , no fevers.  However still no stool in colostomy.  Has abdominal pain  Objective:   Vitals:   08/23/22 0413 08/23/22 0737 08/23/22 0807 08/23/22 1152  BP: 95/70  (!) 165/134 (!) 114/50  Pulse: 96  98 94  Resp: _0 Temp: 97.8 F (36.6 C)  98.7 F (37.1 C) 98.1 F (36.7 C)  TempSrc: Oral  Oral Oral  SpO2: 94% 97% 98% 98%  Weight:      Height:        Intake/Output Summary (Last 24 hours) at 08/23/2022 1356 Last data filed at 08/23/2022 1109 Gross per 24 hour  Intake 240 ml  Output 2100 ml  Net -1860 ml     Wt Readings from Last 3 Encounters:  08/16/22 52.6 kg  07/27/22 53.5 kg  11/09/21 52.2 kg   Physical Exam General: Alert and oriented x 3, NAD Cardiovascular: S1 S2 clear, RRR.  Respiratory: CTA B Gastrointestinal: Soft, dressing intact, colostomy with small output but no formed stools, drain+ Ext: no pedal edema bilaterally Neuro: no new deficits  Data Reviewed:  I have personally reviewed following labs    CBC Lab Results  Component Value Date   WBC 15.9 (H) 08/22/2022   RBC 3.24 (L) 08/22/2022   HGB 10.2 (L) 08/22/2022   HCT 29.6 (L) 08/22/2022   MCV 91.4 08/22/2022   MCH 31.5 08/22/2022   PLT 217 08/22/2022   MCHC 34.5 08/22/2022   RDW 14.0 08/22/2022   LYMPHSABS 1.1 08/16/2022   MONOABS 1.4 (H) 08/16/2022   EOSABS 0.0 08/16/2022   BASOSABS 0.0 52/84/1324     Last metabolic panel Lab Results  Component Value Date   NA 132 (L) 08/23/2022   K 4.0 08/23/2022   CL 99 08/23/2022   CO2 25 08/23/2022   BUN 10 08/23/2022   CREATININE 0.51 (L) 08/23/2022   GLUCOSE 143 (H) 08/23/2022   GFRNONAA >60 08/23/2022    GFRAA 123 10/12/2020   CALCIUM 8.1 (L) 08/23/2022   PHOS 3.0 08/22/2022   PROT 5.6 (L) 08/22/2022   ALBUMIN 1.9 (L) 08/22/2022   LABGLOB 3.0 10/12/2020   AGRATIO 1.5 10/12/2020   BILITOT 0.6 08/22/2022   ALKPHOS 47 08/22/2022   AST 15 08/22/2022   ALT 10 08/22/2022   ANIONGAP 8 08/23/2022    CBG (last 3)  Recent Labs    08/22/22 0805 08/22/22 2034 08/23/22 0806  GLUCAP 124* 157* 142*      Coagulation Profile: No results for input(s): "INR", "PROTIME" in the last 168 hours.   Radiology Studies: I have personally reviewed the imaging studies  No results found.       M.D. [facility] 08/23/2022, 1:56 PM  Available via Epic secure chat 7am-7pm After 7 pm, please refer to night coverage provider listed on amion.    

## 2022-08-23 NOTE — Plan of Care (Signed)
  Problem: Education: Goal: Knowledge of General Education information will improve Description Including pain rating scale, medication(s)/side effects and non-pharmacologic comfort measures Outcome: Progressing   Problem: Health Behavior/Discharge Planning: Goal: Ability to manage health-related needs will improve Outcome: Progressing   

## 2022-08-23 NOTE — Progress Notes (Addendum)
Progress Note  7 Days Post-Op  Subjective: Nursing notes reviewed and overnight events noted. He is very tired this morning and refused CBC blood draw this am. Drank clears yesterday without nausea/emesis/increasing abdominal pain. Still no gas or stool in colostomy. He complains of continued abdominal pain with PCA requiring additional coverage with PO oxycodone  Objective: Vital signs in last 24 hours: Temp:  [97.4 F (36.3 C)-98.7 F (37.1 C)] 98.7 F (37.1 C) (09/22 0807) Pulse Rate:  [84-110] 98 (09/22 0807) Resp:  [15-17] 17 (09/22 0807) BP: (81-165)/(56-134) 165/134 (09/22 0807) SpO2:  [93 %-98 %] 98 % (09/22 0807)    Intake/Output from previous day: 09/21 0701 - 09/22 0700 In: 240 [P.O.:240] Out: 2050 [Urine:1850; Stool:200] Intake/Output this shift: No intake/output data recorded.  PE: General: pleasant, WD, male who is laying in bed in NAD Lungs: Respiratory effort nonlabored Abd: soft, ND, colostomy with thin SS output - no gas or stool. Stoma pink and viable. Drain with SS output.  Midline wound with dressing changed by me this am. Mild surrounding erythema. There is increased drainage in wound this am. See pic below Skin: warm and dry Psych: A&Ox3 with an appropriate affect.           Lab Results:  Recent Labs    08/21/22 0307 08/22/22 0418  WBC 15.4* 15.9*  HGB 10.3* 10.2*  HCT 30.1* 29.6*  PLT 187 217    BMET Recent Labs    08/22/22 0418 08/23/22 0317  NA 131* 132*  K 3.9 4.0  CL 96* 99  CO2 26 25  GLUCOSE 119* 143*  BUN 8 10  CREATININE 0.49* 0.51*  CALCIUM 8.2* 8.1*    PT/INR No results for input(s): "LABPROT", "INR" in the last 72 hours. CMP     Component Value Date/Time   NA 132 (L) 08/23/2022 0317   NA 140 10/12/2020 1526   K 4.0 08/23/2022 0317   CL 99 08/23/2022 0317   CO2 25 08/23/2022 0317   GLUCOSE 143 (H) 08/23/2022 0317   BUN 10 08/23/2022 0317   BUN 10 10/12/2020 1526   CREATININE 0.51 (L) 08/23/2022 0317    CREATININE 0.77 01/27/2013 1105   CALCIUM 8.1 (L) 08/23/2022 0317   PROT 5.6 (L) 08/22/2022 0418   PROT 7.5 10/12/2020 1526   ALBUMIN 1.9 (L) 08/22/2022 0418   ALBUMIN 4.5 10/12/2020 1526   AST 15 08/22/2022 0418   ALT 10 08/22/2022 0418   ALKPHOS 47 08/22/2022 0418   BILITOT 0.6 08/22/2022 0418   BILITOT 0.3 10/12/2020 1526   GFRNONAA >60 08/23/2022 0317   GFRAA 123 10/12/2020 1526   Lipase     Component Value Date/Time   LIPASE 39 08/16/2022 0506       Studies/Results: No results found.  Anti-infectives: Anti-infectives (From admission, onward)    Start     Dose/Rate Route Frequency Ordered Stop   08/17/22 1630  piperacillin-tazobactam (ZOSYN) IVPB 3.375 g        3.375 g 12.5 mL/hr over 240 Minutes Intravenous Every 8 hours 08/17/22 1540     08/16/22 1500  piperacillin-tazobactam (ZOSYN) IVPB 3.375 g        3.375 g 100 mL/hr over 30 Minutes Intravenous STAT 08/16/22 1446 08/16/22 2032   08/16/22 0615  piperacillin-tazobactam (ZOSYN) IVPB 3.375 g        3.375 g 100 mL/hr over 30 Minutes Intravenous  Once 08/16/22 0609 08/16/22 0713        Assessment/Plan Perforated stercoral ulcer POD  7 ex lap partial sigmoid colectomy, colostomy - history of chronic pain on suboxone. Continue PCA. Baseline flexeril added by primary. Okay for po oxycodone and will continue to adjust pain medications once able to take in more PO - tolerating CLD without n/v - cont.TPN - started 9/19 pm - WBC 15.9 yesterday with repeat pending, afebrile, tachycardic - continue IV zosyn  - with get CT scan today to evaluate for intraabdominal abscess - drain SS - continue drain - WTD dressing changes to midline wound - WOC RN following for new ostomy - encourage ambulation and IS use  FEN - CLD, IVFs, TPN VTE - SCDs, lovenox ID - zosyn 9/14> Foley - out 9/18. voiding   Per primary Tobacco abuse/COPD - 2 ppd H/O substance abuse H/O opioid dependence - now on suboxone prn H/O seizure  d/o Chronic pancreatitis Zollinger-Ellison syndrome GERD Hypoalbuminemic - appears malnourished at baseline due to muscle wasting RA - mtx and enbrel held   LOS: 7 days   Winferd Humphrey, [facility] 08/23/2022, 9:51 AM Please see Amion for pager number during day hours 7:00am-4:30pm

## 2022-08-23 NOTE — Progress Notes (Addendum)
Pt noted to be very anxious and restless throughout the night. Constantly fidgeting with IV lines and tubing, requiring frequent redirection and assistance. Pt noted with intermittent tearful episodes as he relays the fact that his "close friend is going to hospice". Easily agitated with alarming of IV machines, however frequently occluding lines and removing CO2 cannula. Pt noted disconnecting one of his IV lines when this nurse enters room. Pt encouraged not to disconnect lines and educated on risks associated with thereof. Pt states "well okay I was just trying to help you out by stopping that beeping". Pt reports a "dream" where he thought he was smoking and was trying to put out the cigarette butt, then proceeds to ask this nurse if he could go outside to smoke. This nurse informed pt that no smoking was allowed while he was hospitalized on unit and adverse consequences related to O2 administration and smoking. Pt verbalizes understanding. Currently with nicotine patch noted to arm. PRN ativan given x2 in addition to oxycodone x1 this shift for anxiety and breakthrough pain. Pt consistently reports pain btwn 8-9/10. Awake most of the night, resting quietly approximately 0400.

## 2022-08-24 ENCOUNTER — Inpatient Hospital Stay (HOSPITAL_COMMUNITY): Payer: Medicare HMO

## 2022-08-24 DIAGNOSIS — K631 Perforation of intestine (nontraumatic): Secondary | ICD-10-CM | POA: Diagnosis not present

## 2022-08-24 LAB — PROTIME-INR
INR: 1.1 (ref 0.8–1.2)
Prothrombin Time: 14.5 seconds (ref 11.4–15.2)

## 2022-08-24 LAB — CBC
HCT: 27.2 % — ABNORMAL LOW (ref 39.0–52.0)
Hemoglobin: 9 g/dL — ABNORMAL LOW (ref 13.0–17.0)
MCH: 30.7 pg (ref 26.0–34.0)
MCHC: 33.1 g/dL (ref 30.0–36.0)
MCV: 92.8 fL (ref 80.0–100.0)
Platelets: 328 10*3/uL (ref 150–400)
RBC: 2.93 MIL/uL — ABNORMAL LOW (ref 4.22–5.81)
RDW: 14.4 % (ref 11.5–15.5)
WBC: 21.1 10*3/uL — ABNORMAL HIGH (ref 4.0–10.5)
nRBC: 0 % (ref 0.0–0.2)

## 2022-08-24 LAB — BASIC METABOLIC PANEL
Anion gap: 5 (ref 5–15)
BUN: 9 mg/dL (ref 6–20)
CO2: 26 mmol/L (ref 22–32)
Calcium: 8.1 mg/dL — ABNORMAL LOW (ref 8.9–10.3)
Chloride: 100 mmol/L (ref 98–111)
Creatinine, Ser: 0.56 mg/dL — ABNORMAL LOW (ref 0.61–1.24)
GFR, Estimated: >60 mL/min (ref >60)
Glucose, Bld: 120 mg/dL — ABNORMAL HIGH (ref 70–99)
Potassium: 4 mmol/L (ref 3.5–5.1)
Sodium: 131 mmol/L — ABNORMAL LOW (ref 135–145)

## 2022-08-24 LAB — PHOSPHORUS: Phosphorus: 3.4 mg/dL (ref 2.5–4.6)

## 2022-08-24 LAB — GLUCOSE, CAPILLARY
Glucose-Capillary: 124 mg/dL — ABNORMAL HIGH (ref 70–99)
Glucose-Capillary: 109 mg/dL — ABNORMAL HIGH (ref 70–99)

## 2022-08-24 LAB — MAGNESIUM: Magnesium: 1.8 mg/dL (ref 1.7–2.4)

## 2022-08-24 MED ORDER — SODIUM CHLORIDE 0.9% FLUSH
5.0000 mL | Freq: Three times a day (TID) | INTRAVENOUS | Status: DC
  Administered 2022-08-24 – 2022-09-11 (×52): 5 mL

## 2022-08-24 MED ORDER — MAGNESIUM SULFATE 2 GM/50ML IV SOLN
2.0000 g | Freq: Once | INTRAVENOUS | Status: AC
  Administered 2022-08-24: 2 g via INTRAVENOUS
  Filled 2022-08-24: qty 50

## 2022-08-24 MED ORDER — FENTANYL CITRATE (PF) 100 MCG/2ML IJ SOLN
INTRAMUSCULAR | Status: AC
  Filled 2022-08-24: qty 4

## 2022-08-24 MED ORDER — LIDOCAINE HCL 1 % IJ SOLN
10.0000 mL | Freq: Once | INTRAMUSCULAR | Status: AC
  Administered 2022-08-24: 10 mL via INTRADERMAL

## 2022-08-24 MED ORDER — ENOXAPARIN SODIUM 40 MG/0.4ML IJ SOSY
40.0000 mg | PREFILLED_SYRINGE | INTRAMUSCULAR | Status: DC
  Administered 2022-08-25 – 2022-09-11 (×18): 40 mg via SUBCUTANEOUS
  Filled 2022-08-24 (×17): qty 0.4

## 2022-08-24 MED ORDER — OXYCODONE HCL 5 MG PO TABS
10.0000 mg | ORAL_TABLET | Freq: Four times a day (QID) | ORAL | Status: DC | PRN
  Administered 2022-08-24 – 2022-08-27 (×8): 15 mg via ORAL
  Filled 2022-08-24 (×8): qty 3

## 2022-08-24 MED ORDER — LIDOCAINE HCL 1 % IJ SOLN
INTRAMUSCULAR | Status: AC | PRN
  Administered 2022-08-24: 10 mL via INTRADERMAL

## 2022-08-24 MED ORDER — TRAVASOL 10 % IV SOLN
INTRAVENOUS | Status: AC
  Filled 2022-08-24: qty 873.6

## 2022-08-24 MED ORDER — NALOXONE HCL 0.4 MG/ML IJ SOLN
INTRAMUSCULAR | Status: AC
  Filled 2022-08-24: qty 1

## 2022-08-24 MED ORDER — FLUMAZENIL 0.5 MG/5ML IV SOLN
INTRAVENOUS | Status: AC
  Filled 2022-08-24: qty 5

## 2022-08-24 MED ORDER — MIDAZOLAM HCL 2 MG/2ML IJ SOLN
INTRAMUSCULAR | Status: AC
  Filled 2022-08-24: qty 4

## 2022-08-24 NOTE — Progress Notes (Signed)
Referring Physician(s): Maczis,M,PA-C  Supervising Physician: Jacqulynn Cadet  Patient Status:  [facility] - In-pt  Chief Complaint:  Abdominal pain, left lower abdominal fluid collection?abscess  Subjective: Pt known to IR service from image guided right renal cystic lesion aspiration 2012. He is a 61 y.o. male with medical history significant for COPD, chronic pancreatitis, rheumatoid arthritis on Enbrel, history of seizures not on an AED, chronic pain on Suboxone, polysubstance abuse, Zollinger-Ellison syndrome, and constipation who presented 08/16/22  with complaints of  lower abdominal pain. Imaging remarkable for colonic perforation/obstruction/stercoral ulcer. On 9/15 he underwent:  EXPLORATORY LAPAROTOMY  COLOSTOMY (Left) PARTIAL SIGMOID COLECTOMY   Follow up CT A/P on 9/22 revealed:   Postop changes from sigmoid colectomy with left lower quadrant colostomy.   7 cm extraperitoneal collection in left lower quadrant lateral to the psoas muscle, suspicious for abscess.   Increased mild to moderate left hydronephrosis, due to inflammatory change in the left retroperitoneum. No evidence of ureteral calculi or dilatation.   Tiny nonobstructing left renal calculus.   Left lower lobe atelectasis or infiltrate and tiny left pleural effusion. Small pericardial effusion also present   Pt is afebrile, WBC 21.1, hgb 9, plts nl,PT/INR pending, creat 0.56; blood cx neg. Request now received for image guided drainage of LLQ fluid collection.   Pt denies fever,HA,CP,worsening dyspnea, cough, back pain,N/V or bleeding   Past Medical History:  Diagnosis Date   Blood transfusion without reported diagnosis    Chest pain    Chronic neck pain    Chronic pain    Chronic pancreatitis (HCC)    COPD (chronic obstructive pulmonary disease) (HCC)    Degenerative disk disease    C 5 &C 6   Depression    GI bleed    Gout    Migraine    Migraine headache    Pain management contract  agreement    Seizure disorder ([place])    x 7 years   Seizures ([place])    Substance abuse ([place])    Has been cleaned for 18 mos   Zollinger-Ellison syndrome    Past Surgical History:  Procedure Laterality Date   APPENDECTOMY     per CT 2012- s/p appendectomy   CHOLECYSTECTOMY     EYE SURGERY     HERNIA REPAIR     LAPAROTOMY N/A 08/16/2022   Procedure: EXPLORATORY LAPAROTOMY;  Surgeon: Ralene Ok, MD;  Location: [facility];  Service: General;  Laterality: N/A;   OSTOMY Left 08/16/2022   Procedure: COLOSTOMY;  Surgeon: Ralene Ok, MD;  Location: [facility];  Service: General;  Laterality: Left;   PARTIAL COLECTOMY N/A 08/16/2022   Procedure: PARTIAL SIGMOID COLECTOMY;  Surgeon: Ralene Ok, MD;  Location: [facility];  Service: General;  Laterality: N/A;   PARTIAL GASTRECTOMY     SMALL INTESTINE SURGERY            Allergies: Ambien [zolpidem tartrate], Iohexol, Lunesta [eszopiclone], Seroquel [quetiapine fumarate], and Sonata [zaleplon]  Medications: Prior to Admission medications   Medication Sig Start Date End Date Taking? Authorizing Provider  amitriptyline (ELAVIL) 50 MG tablet Take 100 mg by mouth at bedtime.   Yes [provider]  atorvastatin (LIPITOR) 20 MG tablet Take 1 tablet (20 mg total) by mouth daily. 02/19/21  Yes Just, Laurita Quint, FNP  buprenorphine-naloxone (SUBOXONE) 8-2 mg SUBL SL tablet Place 1 tablet under the tongue daily.   Yes [provider]  cyclobenzaprine (FLEXERIL) 10 MG tablet Take 1 tablet (10 mg total) by  mouth 3 (three) times daily. 02/19/21  Yes Just, Laurita Quint, FNP  etanercept (ENBREL) 50 MG/ML injection Inject 50 mg into the skin once a week.   Yes [provider]  folic acid (FOLVITE) 1 MG tablet Take 1 mg by mouth daily. 08/06/22  Yes [provider]  linaclotide Rolan Lipa) 145 MCG CAPS capsule Take 1 capsule (145 mcg total) by mouth daily before breakfast. 02/19/21  Yes Just, Laurita Quint, FNP  LORazepam (ATIVAN) 1 MG  tablet Take 1 mg by mouth 3 (three) times daily. 07/18/22  Yes [provider]  methotrexate (RHEUMATREX) 2.5 MG tablet Take 10 mg by mouth once a week. 06/08/22  Yes [provider]  mirtazapine (REMERON) 45 MG tablet Take 1 tablet (45 mg total) by mouth at bedtime. 02/19/21  Yes Just, Laurita Quint, FNP  omeprazole (PRILOSEC) 20 MG capsule TAKE 1 CAPSULE(20 MG) BY MOUTH DAILY 02/19/21  Yes Just, Laurita Quint, FNP  traZODone (DESYREL) 50 MG tablet Take 3-4 tablets (150-200 mg total) by mouth at bedtime. 02/19/21  Yes Just, Laurita Quint, FNP     Vital Signs: BP (!) 119/55 (BP Location: Right Leg)   Pulse 98   Temp 98.5 F (36.9 C) (Oral)   Resp 12   Ht 5' 5.5" (1.664 m)   Wt 116 lb (52.6 kg)   SpO2 97%   BMI 19.01 kg/m   Physical Exam awake/alert; chest- distant BS bilat; heart- RRR; abd- soft, intact RLQ surgical drain with brown fluid, midline open wound, LLQ ostomy; no LE edema  Imaging: CT ABDOMEN PELVIS WO CONTRAST  Result Date: 08/23/2022 CLINICAL DATA:  Stercoral colitis with ulcer and perforation. Postop from sigmoid colectomy. EXAM: CT ABDOMEN AND PELVIS WITHOUT CONTRAST TECHNIQUE: Multidetector CT imaging of the abdomen and pelvis was performed following the standard protocol without IV contrast. RADIATION DOSE REDUCTION: This exam was performed according to the departmental dose-optimization program which includes automated exposure control, adjustment of the mA and/or kV according to patient size and/or use of iterative reconstruction technique. COMPARISON:  08/16/2022 FINDINGS: Lower chest: Increased infiltrate or atelectasis in left lower lobe and tiny left pleural effusion. Small pericardial effusion has also increased in size. Hepatobiliary: No mass visualized on this unenhanced exam. Gallbladder is unremarkable. No evidence of biliary ductal dilatation. Pancreas: No mass or inflammatory process visualized on this unenhanced exam. Spleen:  Within normal limits in size.  Adrenals/Urinary tract: Mild right renal parenchymal scarring noted. 3 mm left renal calculus is seen. Mild to moderate left hydronephrosis has increased since previous study, and appears to be due to retroperitoneal inflammatory change along the medial aspect of the left psoas. No evidence of ureteral calculi or dilatation. Small bowel gas is noted in the urinary bladder, likely due to recent catheterization. Stomach/Bowel: Postop changes are seen from sigmoid colectomy with left lower quadrant colostomy since prior study. Catheter is seen within the pelvis. No evidence of bowel obstruction. An extraperitoneal fluid and gas collection is seen in the left lower quadrant lateral to the psoas muscle which measures 7.0 x 6.2 cm. No other abnormal fluid collections are identified. Vascular/Lymphatic: No pathologically enlarged lymph nodes identified. No evidence of abdominal aortic aneurysm. Aortic atherosclerotic calcification incidentally noted. Reproductive:  No mass or other significant abnormality. Other:  None. Musculoskeletal:  No suspicious bone lesions identified. IMPRESSION: Postop changes from sigmoid colectomy with left lower quadrant colostomy. 7 cm extraperitoneal collection in left lower quadrant lateral to the psoas muscle, suspicious for abscess. Increased mild to moderate left  hydronephrosis, due to inflammatory change in the left retroperitoneum. No evidence of ureteral calculi or dilatation. Tiny nonobstructing left renal calculus. Left lower lobe atelectasis or infiltrate and tiny left pleural effusion. Small pericardial effusion also present. Electronically Signed   By: Marlaine Hind M.D.   On: 08/23/2022 21:15    Labs:  CBC: Recent Labs    08/20/22 0040 08/21/22 0307 08/22/22 0418 08/24/22 0356  WBC 13.3* 15.4* 15.9* 21.1*  HGB 10.5* 10.3* 10.2* 9.0*  HCT 29.8* 30.1* 29.6* 27.2*  PLT 180 187 217 328    COAGS: No results for input(s): "INR", "APTT" in the last 8760  hours.  BMP: Recent Labs    08/21/22 0307 08/22/22 0418 08/23/22 0317 08/24/22 0356  NA 132* 131* 132* 131*  K 3.2* 3.9 4.0 4.0  CL 99 96* 99 100  CO2 '26 26 25 26  '$ GLUCOSE 130* 119* 143* 120*  BUN <5* '8 10 9  '$ CALCIUM 7.8* 8.2* 8.1* 8.1*  CREATININE 0.49* 0.49* 0.51* 0.56*  GFRNONAA >60 >60 >60 >60    LIVER FUNCTION TESTS: Recent Labs    08/16/22 0506 08/17/22 0048 08/21/22 0307 08/22/22 0418  BILITOT 0.7 1.3* 0.7 0.6  AST 24 19 14* 15  ALT '11 12 10 10  '$ ALKPHOS 94 45 42 47  PROT 7.3 5.0* 5.4* 5.6*  ALBUMIN 3.4* 2.4* 1.9* 1.9*    Assessment and Plan: Pt known to IR service from image guided right renal cystic lesion aspiration 2012. He is a 61 y.o. male with medical history significant for COPD, chronic pancreatitis, rheumatoid arthritis on Enbrel, history of seizures not on an AED, chronic pain on Suboxone, polysubstance abuse, Zollinger-Ellison syndrome, and constipation who presented 08/16/22  with complaints of  lower abdominal pain. Imaging remarkable for colonic perforation/obstruction/stercoral ulcer. On 9/15 he underwent:  EXPLORATORY LAPAROTOMY  COLOSTOMY (Left) PARTIAL SIGMOID COLECTOMY   Follow up CT A/P on 9/22 revealed:   Postop changes from sigmoid colectomy with left lower quadrant colostomy.   7 cm extraperitoneal collection in left lower quadrant lateral to the psoas muscle, suspicious for abscess.   Increased mild to moderate left hydronephrosis, due to inflammatory change in the left retroperitoneum. No evidence of ureteral calculi or dilatation.   Tiny nonobstructing left renal calculus.   Left lower lobe atelectasis or infiltrate and tiny left pleural effusion. Small pericardial effusion also present   Pt is afebrile, WBC 21.1, hgb 9, plts nl,PT/INR pending, creat 0.56; blood cx neg. Request now received for image guided drainage of LLQ fluid collection. Imaging studies have been reviewed by Dr. Laurence Ferrari. Risks and benefits discussed with  the patient including bleeding, infection, damage to adjacent structures, bowel perforation/fistula connection, and sepsis.  All of the patient's questions were answered, patient is agreeable to proceed. Consent signed and in chart. Procedure tent scheduled for today.     Electronically Signed: D. Rowe Robert, PA-C 08/24/2022, 1:05 PM   I spent a total of 25 Minutes at the the patient's bedside AND on the patient's hospital floor or unit, greater than 50% of which was counseling/coordinating care for image guided left lower abdominal fluid collection drain placement    Patient ID: Yevonne Pax, male   DOB: February 28, 1961, 61 y.o.   MRN: [id number]

## 2022-08-24 NOTE — Plan of Care (Signed)
  Problem: Nutrition: Goal: Adequate nutrition will be maintained Outcome: Progressing   Problem: Pain Managment: Goal: General experience of comfort will improve Outcome: Progressing   Problem: Safety: Goal: Ability to remain free from injury will improve Outcome: Progressing   

## 2022-08-24 NOTE — Procedures (Signed)
Interventional Radiology Procedure Note  Procedure: Placement of 20F drain into LEFT retroperitoneal abscess with 140 mL purulent fluid aspirated.   Complications: None  Estimated Blood Loss: None  Recommendations: - Cultures sent - Flush Q8 hrs - Drain to [employer],  Criselda Peaches, MD

## 2022-08-24 NOTE — Progress Notes (Signed)
8 Days Post-Op  Subjective: CC: Patient reports he is tolerating clears without nausea or vomiting.  Continued pain mainly on the right side of his abdomen but also some on the left.  Reports no stool output from his ostomy bag since surgery but had 1150 recorded on I/O.  Reaching out to RN to confirm.  Did not get out of bed yesterday.  Voiding.  Objective: Vital signs in last 24 hours: Temp:  [98.1 F (36.7 C)-99.7 F (37.6 C)] 99.7 F (37.6 C) (09/23 0424) Pulse Rate:  [94-104] 104 (09/23 0424) Resp:  [12-18] 12 (09/23 0747) BP: (114-165)/(50-134) 123/66 (09/23 0424) SpO2:  [95 %-100 %] 97 % (09/23 0747) FiO2 (%):  [29 %] 29 % (09/23 0413)    Intake/Output from previous day: 09/22 0701 - 09/23 0700 In: 4627.3 [P.O.:480; I.V.:3552.9; IV Piggyback:594.4] Out: 2065 [Urine:900; Drains:15; ZOXWR:6045] Intake/Output this shift: No intake/output data recorded.  PE: General: pleasant, WD, male who is laying in bed in NAD Lungs: Respiratory effort nonlabored Abd: soft, ND, colostomy with scant sweat in bag - no gas or stool. Stoma pink and viable. Drain with thick, purulent like output - 15cc/24 hours. Midline wound with separation as noted below.  There is some granulation tissue overlying.  Less fibrinous exudate.  No obvious evisceration.      Lab Results:  Recent Labs    08/22/22 0418 08/24/22 0356  WBC 15.9* 21.1*  HGB 10.2* 9.0*  HCT 29.6* 27.2*  PLT 217 328   BMET Recent Labs    08/23/22 0317 08/24/22 0356  NA 132* 131*  K 4.0 4.0  CL 99 100  CO2 25 26  GLUCOSE 143* 120*  BUN 10 9  CREATININE 0.51* 0.56*  CALCIUM 8.1* 8.1*   PT/INR No results for input(s): "LABPROT", "INR" in the last 72 hours. CMP     Component Value Date/Time   NA 131 (L) 08/24/2022 0356   NA 140 10/12/2020 1526   K 4.0 08/24/2022 0356   CL 100 08/24/2022 0356   CO2 26 08/24/2022 0356   GLUCOSE 120 (H) 08/24/2022 0356   BUN 9 08/24/2022 0356   BUN 10 10/12/2020 1526    CREATININE 0.56 (L) 08/24/2022 0356   CREATININE 0.77 01/27/2013 1105   CALCIUM 8.1 (L) 08/24/2022 0356   PROT 5.6 (L) 08/22/2022 0418   PROT 7.5 10/12/2020 1526   ALBUMIN 1.9 (L) 08/22/2022 0418   ALBUMIN 4.5 10/12/2020 1526   AST 15 08/22/2022 0418   ALT 10 08/22/2022 0418   ALKPHOS 47 08/22/2022 0418   BILITOT 0.6 08/22/2022 0418   BILITOT 0.3 10/12/2020 1526   GFRNONAA >60 08/24/2022 0356   GFRAA 123 10/12/2020 1526   Lipase     Component Value Date/Time   LIPASE 39 08/16/2022 0506    Studies/Results: CT ABDOMEN PELVIS WO CONTRAST  Result Date: 08/23/2022 CLINICAL DATA:  Stercoral colitis with ulcer and perforation. Postop from sigmoid colectomy. EXAM: CT ABDOMEN AND PELVIS WITHOUT CONTRAST TECHNIQUE: Multidetector CT imaging of the abdomen and pelvis was performed following the standard protocol without IV contrast. RADIATION DOSE REDUCTION: This exam was performed according to the departmental dose-optimization program which includes automated exposure control, adjustment of the mA and/or kV according to patient size and/or use of iterative reconstruction technique. COMPARISON:  08/16/2022 FINDINGS: Lower chest: Increased infiltrate or atelectasis in left lower lobe and tiny left pleural effusion. Small pericardial effusion has also increased in size. Hepatobiliary: No mass visualized on this unenhanced exam. Gallbladder is  unremarkable. No evidence of biliary ductal dilatation. Pancreas: No mass or inflammatory process visualized on this unenhanced exam. Spleen:  Within normal limits in size. Adrenals/Urinary tract: Mild right renal parenchymal scarring noted. 3 mm left renal calculus is seen. Mild to moderate left hydronephrosis has increased since previous study, and appears to be due to retroperitoneal inflammatory change along the medial aspect of the left psoas. No evidence of ureteral calculi or dilatation. Small bowel gas is noted in the urinary bladder, likely due to recent  catheterization. Stomach/Bowel: Postop changes are seen from sigmoid colectomy with left lower quadrant colostomy since prior study. Catheter is seen within the pelvis. No evidence of bowel obstruction. An extraperitoneal fluid and gas collection is seen in the left lower quadrant lateral to the psoas muscle which measures 7.0 x 6.2 cm. No other abnormal fluid collections are identified. Vascular/Lymphatic: No pathologically enlarged lymph nodes identified. No evidence of abdominal aortic aneurysm. Aortic atherosclerotic calcification incidentally noted. Reproductive:  No mass or other significant abnormality. Other:  None. Musculoskeletal:  No suspicious bone lesions identified. IMPRESSION: Postop changes from sigmoid colectomy with left lower quadrant colostomy. 7 cm extraperitoneal collection in left lower quadrant lateral to the psoas muscle, suspicious for abscess. Increased mild to moderate left hydronephrosis, due to inflammatory change in the left retroperitoneum. No evidence of ureteral calculi or dilatation. Tiny nonobstructing left renal calculus. Left lower lobe atelectasis or infiltrate and tiny left pleural effusion. Small pericardial effusion also present. Electronically Signed   By: Marlaine Hind M.D.   On: 08/23/2022 21:15    Anti-infectives: Anti-infectives (From admission, onward)    Start     Dose/Rate Route Frequency Ordered Stop   08/17/22 1630  piperacillin-tazobactam (ZOSYN) IVPB 3.375 g        3.375 g 12.5 mL/hr over 240 Minutes Intravenous Every 8 hours 08/17/22 1540     08/16/22 1500  piperacillin-tazobactam (ZOSYN) IVPB 3.375 g        3.375 g 100 mL/hr over 30 Minutes Intravenous STAT 08/16/22 1446 08/16/22 2032   08/16/22 0615  piperacillin-tazobactam (ZOSYN) IVPB 3.375 g        3.375 g 100 mL/hr over 30 Minutes Intravenous  Once 08/16/22 0609 08/16/22 0713        Assessment/Plan POD 8 ex lap partial sigmoid colectomy, colostomy by Dr. Oda Cogan on 9/15 for Perforated  stercoral ulcer - History of chronic pain on suboxone. Continue PCA.  - Cont.TPN - started 9/19 pm - CT w/ extraperitoneal fluid collection in LLQ lateral to psoas. Will consult IR for possible drainage. Keep NPO for this. Can go back on clears after. Cont abx - Drain more purulent today. Will review with MD - Will review midline wound with MD. Would put mepitel at the base and switch to dry to dry dressing changes.  - WOC RN following for new ostomy - Encourage ambulation and IS use   FEN - NPO as above, IVFs, TPN VTE - SCDs, lovenox ID - zosyn 9/14> Foley - out 9/18. voiding   Per primary Tobacco abuse/COPD - 2 ppd H/O substance abuse H/O opioid dependence - now on suboxone prn H/O seizure d/o Chronic pancreatitis Zollinger-Ellison syndrome GERD Hypoalbuminemic - appears malnourished at baseline due to muscle wasting RA - mtx and enbrel held   LOS: 8 days    Jillyn Ledger , [facility] Surgery 08/24/2022, 7:58 AM Please see Amion for pager number during day hours 7:00am-4:30pm

## 2022-08-24 NOTE — Consult Note (Signed)
[facility] Nurse Consult Note:  [place] Nurse assisted requested for providing Bedside RN with instructions for obtaining Mepitel silicone wound contact layer Kellie Simmering # [phone]) and clarifying dressing orders.  Discussed with [facility] PA-C M. Maczis via text.  [place] nursing team will continue to follow for ostomy care, and will remain available to this patient, the nursing and medical teams.    Thank you for inviting [place] to participate in this patient's Plan of Care.  Maudie Flakes, MSN, RN, CNS, [facility] [place], Serita Grammes, [employer], [employer] phone:  [phone]

## 2022-08-24 NOTE — Progress Notes (Signed)
PROGRESS NOTE  Jerry Knight WHQ:[id number] DOB: [birth year] DOA: 08/16/2022 PCP: Beverley Fiedler, FNP   LOS: 8 days   Brief Narrative / Interim history: Jerry Knight is a 61 y.o. male with medical history significant of  COPD, chronic pancreatitis, rheumatoid arthritis on Enbrel, history of seizures not on an AED, chronic pain on Suboxone, polysubstance abuse, Zollinger-Ellison syndrome, and constipation who presented with complaints of  lower abdominal pain. Imaging remarkable for colonic perforation/obstruction  Patient was admitted for further work-up  Subjective / 24h Interval events: Complains of uncontrolled pain  Assesement and Plan: Principal Problem:   Bowel perforation (HCC) Active Problems:   Sepsis ([place])   Normocytic anemia   Hypokalemia   COPD (chronic obstructive pulmonary disease) (HCC)   Rheumatoid arthritis (HCC)   Anxiety   GERD (gastroesophageal reflux disease)   Chronic neck and back pain   Tobacco abuse   Protein-calorie malnutrition, severe   Principal problem Sepsis secondary to bowel perforation ([place]) due to stercoral ulcer-on admission, CT revealed colonic perforation in the region of the sigmoid colon thought secondary to impacted stool. Met sepsis criteria on admission with tachycardia, tachypnea, leukocytosis, lactic acidosis.  General surgery was consulted and he underwent emergent exploratory laparotomy, left colostomy and partial sigmoid colectomy on 9/15 -Continue Zosyn, n.p.o. with ostomy, TPN.  NGT was removed on 9/18 -Continue clear liquid diet, started 9/21, continue TPN.  No drainage in his ostomy bag -Continue morphine PCA, oxycodone for breakthrough pain, still complaining of abdominal pain, increase oxycodone dose -Repeat CT scan last night with a 7 cm fluid collection in the left lower quadrant, suspicion for abscess.  Discussed with surgery, consult IR today for drainage  Active problems Normocytic anemia -Likely anemia of  chronic disease -H&H stable   Hypokalemia, hyponatremia -K normal  -Continue TPN    COPD, without exacerbation - Chest x-ray noted chronic interstitial lung changes with bibasilar atelectasis. - Albuterol nebs as needed   History of rheumatoid arthritis -Outpatient on Enbrel 50 mg and methotrexate 10 mg on Saturdays -Currently regimen on hold   Insomnia, anxiety -For now continue Ativan as needed for anxiety -Continue trazodone 150 mg at bedtime    GERD -Continue PPI   Chronic pain  -Outpatient on Suboxone, currently on PCA and as needed oxycodone, tizanidine -continue Lidoderm patch for the lower back   Tobacco abuse -Patient reportedly smokes 2 packs of cigarettes per day on average. - cont Nicotine patch   Moderate to severe protein calorie malnutrition due to acute illness, hypoalbuminemia 1.9 -Estimated body mass index is 19.01.  Continue TPN  Scheduled Meds:  acetaminophen  1,000 mg Oral Q6H   Chlorhexidine Gluconate Cloth  6 each Topical Daily   enoxaparin (LOVENOX) injection  40 mg Subcutaneous Q24H   lidocaine  1 patch Transdermal Q24H   morphine   Intravenous Q4H   nicotine  21 mg Transdermal QHS   pantoprazole (PROTONIX) IV  40 mg Intravenous Q12H   sodium chloride flush  3 mL Intravenous Q12H   tiZANidine  2 mg Oral Q8H   traZODone  150 mg Oral QHS   Continuous Infusions:  dextrose 5 % and 0.45% NaCl 10 mL/hr at 08/24/22 0603   piperacillin-tazobactam (ZOSYN)  IV 12.5 mL/hr at 08/24/22 0603   TPN ADULT (ION) 70 mL/hr at 08/24/22 0603   TPN ADULT (ION)     PRN Meds:.albuterol, diphenhydrAMINE **OR** diphenhydrAMINE, LORazepam, naloxone **AND** sodium chloride flush, ondansetron **OR** ondansetron (ZOFRAN) IV, oxyCODONE, phenol, sodium chloride flush  Current Outpatient Medications  Medication Instructions   amitriptyline (ELAVIL) 100 mg, Oral, Daily at bedtime   atorvastatin (LIPITOR) 20 mg, Oral, Daily   buprenorphine-naloxone (SUBOXONE) 8-2 mg SUBL SL  tablet 1 tablet, Sublingual, Daily   cyclobenzaprine (FLEXERIL) 10 mg, Oral, 3 times daily   Enbrel 50 mg, Subcutaneous, Weekly   folic acid (FOLVITE) 1 mg, Oral, Daily   linaclotide (LINZESS) 145 mcg, Oral, Daily before breakfast   LORazepam (ATIVAN) 1 mg, Oral, 3 times daily   methotrexate (RHEUMATREX) 10 mg, Oral, Weekly   mirtazapine (REMERON) 45 mg, Oral, Daily at bedtime   omeprazole (PRILOSEC) 20 MG capsule TAKE 1 CAPSULE(20 MG) BY MOUTH DAILY   traZODone (DESYREL) 150-200 mg, Oral, Daily at bedtime    Diet Orders (From admission, onward)     Start     Ordered   08/22/22 1154  Diet clear liquid Room service appropriate? Yes; Fluid consistency: Thin  Diet effective now       Question Answer Comment  Room service appropriate? Yes   Fluid consistency: Thin      08/22/22 1153            DVT prophylaxis: enoxaparin (LOVENOX) injection 40 mg Start: 08/17/22 2200 SCD's Start: 08/16/22 1754 SCDs Start: 08/16/22 1600   Lab Results  Component Value Date   PLT 328 08/24/2022      Code Status: Full Code  Family Communication: no family at bedside   Status is: Inpatient Remains inpatient appropriate because: Severity of illness   Level of care: Telemetry Surgical  Objective: Vitals:   08/24/22 0424 08/24/22 0747 08/24/22 0817 08/24/22 1024  BP: 123/66  (!) 119/55   Pulse: (!) 104  98   Resp: _0 Temp: 99.7 F (37.6 C)  98.5 F (36.9 C)   TempSrc: Oral  Oral   SpO2: 100% 97% 97%   Weight:      Height:        Intake/Output Summary (Last 24 hours) at 08/24/2022 1151 Last data filed at 08/24/2022 0603 Gross per 24 hour  Intake 4627.31 ml  Output 1165 ml  Net 3462.31 ml   Wt Readings from Last 3 Encounters:  08/16/22 52.6 kg  07/27/22 53.5 kg  11/09/21 52.2 kg    Examination:  Constitutional: NAD Eyes: no scleral icterus ENMT: Mucous membranes are moist.  Neck: normal, supple Respiratory: clear to auscultation bilaterally, no wheezing, no  crackles. Normal respiratory effort. No accessory muscle use.  Cardiovascular: Regular rate and rhythm, no murmurs / rubs / gallops. No LE edema.  Abdomen: non distended, no tenderness. Bowel sounds positive.  Musculoskeletal: no clubbing / cyanosis.   Data Reviewed: I have independently reviewed following labs and imaging studies   CBC Recent Labs  Lab 08/19/22 0035 08/20/22 0040 08/21/22 0307 08/22/22 0418 08/24/22 0356  WBC 14.2* 13.3* 15.4* 15.9* 21.1*  HGB 10.2* 10.5* 10.3* 10.2* 9.0*  HCT 29.2* 29.8* 30.1* 29.6* 27.2*  PLT 173 180 187 217 328  MCV 90.4 89.0 90.9 91.4 92.8  MCH 31.6 31.3 31.1 31.5 30.7  MCHC 34.9 35.2 34.2 34.5 33.1  RDW 13.7 13.6 13.8 14.0 14.4    Recent Labs  Lab 08/20/22 0040 08/21/22 0307 08/22/22 0418 08/23/22 0317 08/24/22 0356  NA 131* 132* 131* 132* 131*  K 3.4* 3.2* 3.9 4.0 4.0  CL 100 99 96* 99 100  CO2 21* _1 GLUCOSE 113* 130* 119* 143* 120*  BUN <5* <5* 8 10 9  CREATININE 0.62 0.49* 0.49* 0.51* 0.56*  CALCIUM 7.5* 7.8* 8.2* 8.1* 8.1*  AST  --  14* 15  --   --   ALT  --  10 10  --   --   ALKPHOS  --  42 47  --   --   BILITOT  --  0.7 0.6  --   --   ALBUMIN  --  1.9* 1.9*  --   --   MG  --  1.7 1.7  --  1.8    ------------------------------------------------------------------------------------------------------------------ Recent Labs    08/22/22 0418  TRIG 119    Lab Results  Component Value Date   HGBA1C 5.8 (H) 10/12/2020   ------------------------------------------------------------------------------------------------------------------ No results for input(s): "TSH", "T4TOTAL", "T3FREE", "THYROIDAB" in the last 72 hours.  Invalid input(s): "FREET3"  Cardiac Enzymes No results for input(s): "CKMB", "TROPONINI", "MYOGLOBIN" in the last 168 hours.  Invalid input(s): "CK" ------------------------------------------------------------------------------------------------------------------ No results found for:  "BNP"  CBG: Recent Labs  Lab 08/22/22 0805 08/22/22 2034 08/23/22 0806 08/23/22 2137 08/24/22 0755  GLUCAP 124* 157* 142* 124* 124*    Recent Results (from the past 240 hour(s))  Culture, blood (routine x 2)     Status: None   Collection Time: 08/16/22  6:55 AM   Specimen: BLOOD  Result Value Ref Range Status   Specimen Description   Final    BLOOD RIGHT ANTECUBITAL Performed at [facility], [address] [place], [place] [address]    Special Requests   Final    Blood Culture results may not be optimal due to an inadequate volume of blood received in culture bottles [place] Performed at [facility], [address] [place], [place] [address]    Culture   Final    NO GROWTH 5 DAYS Performed at [facility], [place] [address], [place], [place] [address]    Report Status 08/21/2022 FINAL  Final  Culture, blood (routine x 2)     Status: None   Collection Time: 08/16/22  7:08 AM   Specimen: BLOOD RIGHT HAND  Result Value Ref Range Status   Specimen Description   Final    BLOOD RIGHT HAND Performed at [facility], [address] [place], [place] [address]    Special Requests   Final    BOTTLES DRAWN AEROBIC AND ANAEROBIC Blood Culture adequate volume Performed at [facility], [address] [place], [place] [address]    Culture   Final    NO GROWTH 5 DAYS Performed at [facility], [place] [address] [place], [place] [address]    Report Status 08/21/2022 FINAL  Final     Radiology Studies: CT ABDOMEN PELVIS WO CONTRAST  Result Date: 08/23/2022 CLINICAL DATA:  Stercoral colitis with ulcer and perforation. Postop from sigmoid colectomy. EXAM: CT ABDOMEN AND PELVIS WITHOUT CONTRAST TECHNIQUE: Multidetector CT imaging of the abdomen and pelvis was performed following the standard protocol without IV contrast. RADIATION DOSE REDUCTION: This exam was performed according to the departmental  dose-optimization program which includes automated exposure control, adjustment of the mA and/or kV according to patient size and/or use of iterative reconstruction technique. COMPARISON:  08/16/2022 FINDINGS: Lower chest: Increased infiltrate or atelectasis in left lower lobe and tiny left pleural effusion. Small pericardial effusion has also increased in size. Hepatobiliary: No mass visualized on this unenhanced exam. Gallbladder is unremarkable. No evidence of biliary ductal dilatation. Pancreas:  No mass or inflammatory process visualized on this unenhanced exam. Spleen:  Within normal limits in size. Adrenals/Urinary tract: Mild right renal parenchymal scarring noted. 3 mm left renal calculus is seen. Mild to moderate left hydronephrosis has increased since previous study, and appears to be due to retroperitoneal inflammatory change along the medial aspect of the left psoas. No evidence of ureteral calculi or dilatation. Small bowel gas is noted in the urinary bladder, likely due to recent catheterization. Stomach/Bowel: Postop changes are seen from sigmoid colectomy with left lower quadrant colostomy since prior study. Catheter is seen within the pelvis. No evidence of bowel obstruction. An extraperitoneal fluid and gas collection is seen in the left lower quadrant lateral to the psoas muscle which measures 7.0 x 6.2 cm. No other abnormal fluid collections are identified. Vascular/Lymphatic: No pathologically enlarged lymph nodes identified. No evidence of abdominal aortic aneurysm. Aortic atherosclerotic calcification incidentally noted. Reproductive:  No mass or other significant abnormality. Other:  None. Musculoskeletal:  No suspicious bone lesions identified. IMPRESSION: Postop changes from sigmoid colectomy with left lower quadrant colostomy. 7 cm extraperitoneal collection in left lower quadrant lateral to the psoas muscle, suspicious for abscess. Increased mild to moderate left hydronephrosis, due to  inflammatory change in the left retroperitoneum. No evidence of ureteral calculi or dilatation. Tiny nonobstructing left renal calculus. Left lower lobe atelectasis or infiltrate and tiny left pleural effusion. Small pericardial effusion also present. Electronically Signed   By: Marlaine Hind M.D.   On: 08/23/2022 21:15     Marzetta Board, MD, PhD [facility]  Between 7 am - 7 pm I am available, please contact me via Amion (for emergencies) or Securechat (non urgent messages)  Between 7 pm - 7 am I am not available, please contact night coverage MD/APP via Amion

## 2022-08-24 NOTE — Progress Notes (Signed)
PHARMACY - TOTAL PARENTERAL NUTRITION CONSULT NOTE   Indication: Prolonged ileus  Patient Measurements: Height: 5' 5.5" (166.4 cm) Weight: 52.6 kg (116 lb) IBW/kg (Calculated) : 62.65 TPN AdjBW (KG): 52.6 Body mass index is 19.01 kg/m.  Usual Weight: 115 lbs  Assessment:  61 yo male with a history of chronic pain syndrome on Suboxone, chronic pancreatitis with prior surgery for pseudocyst removal in the 1990s, prior BII in the 1980s, COPD, rheumatoid arthritis on Enbrel, history of seizures not on an AED, and Zollinger-Ellison syndrome who presented with acute onset of abdominal pain along with N/V. He was found to have a WBC of 22K, lactic acid of 3.1 and a CT scan concerning for a colonic perforation. Pharmacy consulted to initiate TPN for prolonged ileus.  Glucose / Insulin: no hx DM, CBGs controlled; x1 decadron dose 9/15 Electrolytes: Na 131, K 4.0, Cl 100, Mg 1.8. Other electrolytes wnl.  Renal: Scr 0.56 (bsl < 1); BUN wnl Hepatic: LFTs wnl. Albumin 1.9. TG 119.  Intake / Output; MIVF: UOP 0.7 ml/kg/hr; JP drain 15 ml. 1150 ml stool.   GI Imaging: 9/15 colonic perforation in the region of the sigmoid colon likely secondary to impacted stool GI Surgeries / Procedures:  9/15 Exploratory laparotomy, Left colostomy and partial sigmoid colectomy   Central access: 08/20/22 TPN start date:  08/20/22  Nutritional Goals: Goal TPN rate is 70 mL/hr (provides 84 g of protein and 1690 kcals per day)  RD Assessment: Estimated Needs Total Energy Estimated Needs: 1700-1900 Total Protein Estimated Needs: 85-100 grams Total Fluid Estimated Needs: >/= 1.7 L   Current Nutrition:  9/19 NPO 9/21 CLD  Plan:  Continue TPN at goal rate of 42m/hr (100% goal rate) at 1800 to provide 87g AA and 1703 kcal Electrolytes in TPN: Increase Na to 150 mEq/L. Increase Mg to 10 mEq/L. Continue K 50 mEq/L, Ca 5 mEq/L, phos 15 mmol/L. Continue Cl:Ac 1:1.  Mg 2g x1  Add standard MVI and trace elements to  TPN Monitor CBGs - continue off SSI since CBGs controlled  Monitor TPN labs on Mon/Thurs, as needed Follow up advancement of diet    Thank you for allowing pharmacy to be a part of this patient's care.  GCristela Felt PharmD, BCPS Clinical Pharmacist 08/24/2022 8:20 AM

## 2022-08-24 NOTE — Plan of Care (Signed)
  Problem: Clinical Measurements: Goal: Will remain free from infection Outcome: Progressing   Problem: Clinical Measurements: Goal: Diagnostic test results will improve Outcome: Progressing   Problem: Activity: Goal: Risk for activity intolerance will decrease Outcome: Progressing   Problem: Nutrition: Goal: Adequate nutrition will be maintained Outcome: Progressing   Problem: Elimination: Goal: Will not experience complications related to bowel motility Outcome: Progressing   Problem: Pain Managment: Goal: General experience of comfort will improve Outcome: Progressing

## 2022-08-25 DIAGNOSIS — K631 Perforation of intestine (nontraumatic): Secondary | ICD-10-CM | POA: Diagnosis not present

## 2022-08-25 LAB — GLUCOSE, CAPILLARY
Glucose-Capillary: 116 mg/dL — ABNORMAL HIGH (ref 70–99)
Glucose-Capillary: 145 mg/dL — ABNORMAL HIGH (ref 70–99)

## 2022-08-25 LAB — COMPREHENSIVE METABOLIC PANEL
ALT: 12 U/L (ref 0–44)
AST: 16 U/L (ref 15–41)
Albumin: 1.8 g/dL — ABNORMAL LOW (ref 3.5–5.0)
Alkaline Phosphatase: 74 U/L (ref 38–126)
Anion gap: 7 (ref 5–15)
BUN: 9 mg/dL (ref 6–20)
CO2: 26 mmol/L (ref 22–32)
Calcium: 8.2 mg/dL — ABNORMAL LOW (ref 8.9–10.3)
Chloride: 102 mmol/L (ref 98–111)
Creatinine, Ser: 0.54 mg/dL — ABNORMAL LOW (ref 0.61–1.24)
GFR, Estimated: >60 mL/min (ref >60)
Glucose, Bld: 195 mg/dL — ABNORMAL HIGH (ref 70–99)
Potassium: 4.4 mmol/L (ref 3.5–5.1)
Sodium: 135 mmol/L (ref 135–145)
Total Bilirubin: 0.4 mg/dL (ref 0.3–1.2)
Total Protein: 6.3 g/dL — ABNORMAL LOW (ref 6.5–8.1)

## 2022-08-25 LAB — CBC
HCT: 27.1 % — ABNORMAL LOW (ref 39.0–52.0)
Hemoglobin: 9.1 g/dL — ABNORMAL LOW (ref 13.0–17.0)
MCH: 31 pg (ref 26.0–34.0)
MCHC: 33.6 g/dL (ref 30.0–36.0)
MCV: 92.2 fL (ref 80.0–100.0)
Platelets: 380 10*3/uL (ref 150–400)
RBC: 2.94 MIL/uL — ABNORMAL LOW (ref 4.22–5.81)
RDW: 14.6 % (ref 11.5–15.5)
WBC: 15.2 10*3/uL — ABNORMAL HIGH (ref 4.0–10.5)
nRBC: 0 % (ref 0.0–0.2)

## 2022-08-25 MED ORDER — TRAVASOL 10 % IV SOLN
INTRAVENOUS | Status: AC
  Filled 2022-08-25: qty 873.6

## 2022-08-25 NOTE — Progress Notes (Signed)
Referring Physician(s): Kinsinger,L   Supervising Physician: Jacqulynn Cadet  Patient Status:  Jerry Knight - In-pt  Chief Complaint:  Abdominal/back pain, retroperitoneal abscess  Subjective: Pt still having some lower abd/pelvic/back discomfort, no N/V   Allergies: Ambien [zolpidem tartrate], Iohexol, Lunesta [eszopiclone], Seroquel [quetiapine fumarate], and Sonata [zaleplon]  Medications: Prior to Admission medications   Medication Sig Start Date End Date Taking? Authorizing Provider  amitriptyline (ELAVIL) 50 MG tablet Take 100 mg by mouth at bedtime.   Yes [provider]  atorvastatin (LIPITOR) 20 MG tablet Take 1 tablet (20 mg total) by mouth daily. 02/19/21  Yes Just, Laurita Quint, FNP  buprenorphine-naloxone (SUBOXONE) 8-2 mg SUBL SL tablet Place 1 tablet under the tongue daily.   Yes [provider]  cyclobenzaprine (FLEXERIL) 10 MG tablet Take 1 tablet (10 mg total) by mouth 3 (three) times daily. 02/19/21  Yes Just, Laurita Quint, FNP  etanercept (ENBREL) 50 MG/ML injection Inject 50 mg into the skin once a week.   Yes [provider]  folic acid (FOLVITE) 1 MG tablet Take 1 mg by mouth daily. 08/06/22  Yes [provider]  linaclotide Rolan Lipa) 145 MCG CAPS capsule Take 1 capsule (145 mcg total) by mouth daily before breakfast. 02/19/21  Yes Just, Laurita Quint, FNP  LORazepam (ATIVAN) 1 MG tablet Take 1 mg by mouth 3 (three) times daily. 07/18/22  Yes [provider]  methotrexate (RHEUMATREX) 2.5 MG tablet Take 10 mg by mouth once a week. 06/08/22  Yes [provider]  mirtazapine (REMERON) 45 MG tablet Take 1 tablet (45 mg total) by mouth at bedtime. 02/19/21  Yes Just, Laurita Quint, FNP  omeprazole (PRILOSEC) 20 MG capsule TAKE 1 CAPSULE(20 MG) BY MOUTH DAILY 02/19/21  Yes Just, Laurita Quint, FNP  traZODone (DESYREL) 50 MG tablet Take 3-4 tablets (150-200 mg total) by mouth at bedtime. 02/19/21  Yes Just, Laurita Quint, FNP     Vital Signs: BP  118/75 (BP Location: Left Arm)   Pulse 87   Temp 98.5 F (36.9 C) (Oral)   Resp 15   Ht 5' 5.5" (1.664 m)   Wt 116 lb (52.6 kg)   SpO2 100%   BMI 19.01 kg/m   Physical Exam awake/alert; LLQ drain intact, insertion site, site mod tender, OP 210 cc yesterday, approx 25 cc now purulent blood-tinged fluid, drain flushed without difficulty  Imaging: CT GUIDED VISCERAL FLUID DRAIN BY PERC CATH  Result Date: 08/24/2022 INDICATION: 61 year old male with fluid and gas collection in left retroperitoneal space concerning for abscess. EXAM: CT GUIDED DRAINAGE OF retroperitoneal ABSCESS TECHNIQUE: Multidetector CT imaging of the abdomen was performed following the standard protocol without IV contrast. RADIATION DOSE REDUCTION: This exam was performed according to the departmental dose-optimization program which includes automated exposure control, adjustment of the mA and/or kV according to patient size and/or use of iterative reconstruction technique. MEDICATIONS: The patient is currently admitted to the hospital and receiving intravenous antibiotics. The antibiotics were administered within an appropriate time frame prior to the initiation of the procedure. ANESTHESIA/SEDATION: Moderate (conscious) sedation was employed during this procedure. A total of Versed mg and Fentanyl mcg was administered intravenously by the radiology nurse. Total intra-service moderate Sedation Time: minutes. The patient's level of consciousness and vital signs were monitored continuously by radiology nursing throughout the procedure under my direct supervision. COMPLICATIONS: None immediate. TECHNIQUE: Informed written consent was obtained from the patient after a thorough discussion of the procedural risks, benefits and alternatives. All questions were  addressed. Maximal Sterile Barrier Technique was utilized including caps, mask, sterile gowns, sterile gloves, sterile drape, hand hygiene and skin antiseptic. A timeout was  performed prior to the initiation of the procedure. PROCEDURE: The operative field was prepped with Chlorhexidine in a sterile fashion, and a sterile drape was applied covering the operative field. A sterile gown and sterile gloves were used for the procedure. Local anesthesia was provided with 1% Lidocaine. FINDINGS: A planning axial CT scan was performed. Fluid and gas collection in the left retroperitoneal space posterior to the psoas muscle was successfully identified. A suitable skin entry site was selected and marked. The skin was sterilely prepped and draped in the standard fashion with chlorhexidine skin prep. Local anesthesia was attained by infiltration with 1% lidocaine. A small dermatotomy was made. Under intermittent CT guidance, an 18 gauge trocar needle was advanced into the fluid and gas collection. A 0.035 wire was then coiled in the collection. The skin tract was dilated to twelve [place]. A Cook 12 [place] all-purpose drainage catheter was advanced over the wire and formed. Aspiration yields 140 mL of purulent foul-smelling fluid. Samples were sent for Gram stain and culture. The abscess cavity was then lavaged with saline and the drain secured to the skin with 0 Prolene suture. The drain was connected to JP bulb suction. IMPRESSION: Successful placement of 12 French abscess drain with evacuation of 140 mL purulent fluid. Samples were sent for Gram stain and culture. Electronically Signed   By: Jacqulynn Cadet M.D.   On: 08/24/2022 16:54   CT ABDOMEN PELVIS WO CONTRAST  Result Date: 08/23/2022 CLINICAL DATA:  Stercoral colitis with ulcer and perforation. Postop from sigmoid colectomy. EXAM: CT ABDOMEN AND PELVIS WITHOUT CONTRAST TECHNIQUE: Multidetector CT imaging of the abdomen and pelvis was performed following the standard protocol without IV contrast. RADIATION DOSE REDUCTION: This exam was performed according to the departmental dose-optimization program which includes automated exposure  control, adjustment of the mA and/or kV according to patient size and/or use of iterative reconstruction technique. COMPARISON:  08/16/2022 FINDINGS: Lower chest: Increased infiltrate or atelectasis in left lower lobe and tiny left pleural effusion. Small pericardial effusion has also increased in size. Hepatobiliary: No mass visualized on this unenhanced exam. Gallbladder is unremarkable. No evidence of biliary ductal dilatation. Pancreas: No mass or inflammatory process visualized on this unenhanced exam. Spleen:  Within normal limits in size. Adrenals/Urinary tract: Mild right renal parenchymal scarring noted. 3 mm left renal calculus is seen. Mild to moderate left hydronephrosis has increased since previous study, and appears to be due to retroperitoneal inflammatory change along the medial aspect of the left psoas. No evidence of ureteral calculi or dilatation. Small bowel gas is noted in the urinary bladder, likely due to recent catheterization. Stomach/Bowel: Postop changes are seen from sigmoid colectomy with left lower quadrant colostomy since prior study. Catheter is seen within the pelvis. No evidence of bowel obstruction. An extraperitoneal fluid and gas collection is seen in the left lower quadrant lateral to the psoas muscle which measures 7.0 x 6.2 cm. No other abnormal fluid collections are identified. Vascular/Lymphatic: No pathologically enlarged lymph nodes identified. No evidence of abdominal aortic aneurysm. Aortic atherosclerotic calcification incidentally noted. Reproductive:  No mass or other significant abnormality. Other:  None. Musculoskeletal:  No suspicious bone lesions identified. IMPRESSION: Postop changes from sigmoid colectomy with left lower quadrant colostomy. 7 cm extraperitoneal collection in left lower quadrant lateral to the psoas muscle, suspicious for abscess. Increased mild to moderate left hydronephrosis,  due to inflammatory change in the left retroperitoneum. No evidence  of ureteral calculi or dilatation. Tiny nonobstructing left renal calculus. Left lower lobe atelectasis or infiltrate and tiny left pleural effusion. Small pericardial effusion also present. Electronically Signed   By: Marlaine Hind M.D.   On: 08/23/2022 21:15    Labs:  CBC: Recent Labs    08/21/22 0307 08/22/22 0418 08/24/22 0356 08/25/22 0325  WBC 15.4* 15.9* 21.1* 15.2*  HGB 10.3* 10.2* 9.0* 9.1*  HCT 30.1* 29.6* 27.2* 27.1*  PLT 187 217 328 380    COAGS: Recent Labs    08/24/22 1415  INR 1.1    BMP: Recent Labs    08/22/22 0418 08/23/22 0317 08/24/22 0356 08/25/22 0325  NA 131* 132* 131* 135  K 3.9 4.0 4.0 4.4  CL 96* 99 100 102  CO2 '26 25 26 26  '$ GLUCOSE 119* 143* 120* 195*  BUN '8 10 9 9  '$ CALCIUM 8.2* 8.1* 8.1* 8.2*  CREATININE 0.49* 0.51* 0.56* 0.54*  GFRNONAA >60 >60 >60 >60    LIVER FUNCTION TESTS: Recent Labs    08/17/22 0048 08/21/22 0307 08/22/22 0418 08/25/22 0325  BILITOT 1.3* 0.7 0.6 0.4  AST 19 14* 15 16  ALT '12 10 10 12  '$ ALKPHOS 45 42 47 74  PROT 5.0* 5.4* 5.6* 6.3*  ALBUMIN 2.4* 1.9* 1.9* 1.8*    Assessment and Plan: Pt s/p ex lap /partial sig colectomy/colostomy for perforated stercoral ulcer on 9/15; now with postop abscess, s/p LLQ/RP drain on 9/23; afebrile, WBC 15.2(21.1), hgb stable, creat nl; drain fl cx- e coli; suscep pend  Drain Location: LLQ Size: Fr size: 12 Fr Date of placement: 08/24/22  Currently to: Drain collection device: suction bulb 24 hour output:  Output by Drain (mL) 08/23/22 0701 - 08/23/22 1900 08/23/22 1901 - 08/24/22 0700 08/24/22 0701 - 08/24/22 1900 08/24/22 1901 - 08/25/22 0700 08/25/22 0701 - 08/25/22 1422  Closed System Drain 1 Right Abdomen Bulb (JP) 19 Fr. 10 5   0  Closed System Drain 3 Lateral;Left LLQ Bulb (JP) 12 Fr.   180 30 0      Current examination: Flushes/aspirates easily.  Insertion site unremarkable. Suture and stat lock in place. Dressed appropriately.   Plan: Continue TID  flushes with 5 cc NS. Record output Q shift. Dressing changes QD or PRN if soiled.  Call IR APP or on call IR MD if difficulty flushing or sudden change in drain output.  Repeat imaging/possible drain injection once output < 10 mL/QD (excluding flush material). Consideration for drain removal if output is < 10 mL/QD (excluding flush material), pending discussion with the providing surgical service.  Discharge planning: Please contact IR APP or on call IR MD prior to patient d/c to ensure appropriate follow up plans are in place. Typically patient will follow up with IR clinic 10-14 days post d/c for repeat imaging/possible drain injection. IR scheduler will contact patient with date/time of appointment. Patient will need to flush drain QD with 5 cc NS, record output QD, dressing changes every 2-3 days or earlier if soiled.   IR will continue to follow - please call with questions or concerns.      Electronically Signed: D. Rowe Robert, PA-C 08/25/2022, 2:17 PM   I spent a total of 15 Minutes at the the patient's bedside AND on the patient's hospital floor or unit, greater than 50% of which was counseling/coordinating care for left retroperitoneal abscess drain    Patient ID: Jerry Flake  Knight, male   DOB: 1961-04-11, 61 y.o.   MRN: [id number]

## 2022-08-25 NOTE — Progress Notes (Addendum)
9 Days Post-Op  Subjective: CC: S/p IR drain yesterday w 140 mL purulent fluid aspirated Pain at drain site. Abdominal pain stable. Controlled with pca. No n/v. Tolerating cld. No output from ostomy. Mobilized in room. Voiding.   Objective: Vital signs in last 24 hours: Temp:  [98.3 F (36.8 C)-98.5 F (36.9 C)] 98.5 F (36.9 C) (09/24 0757) Pulse Rate:  [83-91] 87 (09/24 0757) Resp:  [11-16] 15 (09/24 0757) BP: (97-124)/(59-75) 118/75 (09/24 0757) SpO2:  [97 %-100 %] 100 % (09/24 0757) FiO2 (%):  [29 %] 29 % (09/24 0258)    Intake/Output from previous day: 09/23 0701 - 09/24 0700 In: 500 [I.V.:500] Out: 1710 [Urine:1500; Drains:210] Intake/Output this shift: Total I/O In: -  Out: 275 [Urine:275]  PE: General: pleasant, WD, male who is laying in bed in NAD Lungs: Respiratory effort nonlabored Abd: soft, ND, colostomy with scant sweat in bag - no gas or stool. Stoma pink and viable. Drain with thick, purulent like output similar to yesterday. IR drain with bloody purulent output. Midline wound stable from yesterday - mepitel note at base - will have RN make sure this get's ordered and placed.   Lab Results:  Recent Labs    08/24/22 0356 08/25/22 0325  WBC 21.1* 15.2*  HGB 9.0* 9.1*  HCT 27.2* 27.1*  PLT 328 380   BMET Recent Labs    08/24/22 0356 08/25/22 0325  NA 131* 135  K 4.0 4.4  CL 100 102  CO2 26 26  GLUCOSE 120* 195*  BUN 9 9  CREATININE 0.56* 0.54*  CALCIUM 8.1* 8.2*   PT/INR Recent Labs    08/24/22 1415  LABPROT 14.5  INR 1.1   CMP     Component Value Date/Time   NA 135 08/25/2022 0325   NA 140 10/12/2020 1526   K 4.4 08/25/2022 0325   CL 102 08/25/2022 0325   CO2 26 08/25/2022 0325   GLUCOSE 195 (H) 08/25/2022 0325   BUN 9 08/25/2022 0325   BUN 10 10/12/2020 1526   CREATININE 0.54 (L) 08/25/2022 0325   CREATININE 0.77 01/27/2013 1105   CALCIUM 8.2 (L) 08/25/2022 0325   PROT 6.3 (L) 08/25/2022 0325   PROT 7.5 10/12/2020  1526   ALBUMIN 1.8 (L) 08/25/2022 0325   ALBUMIN 4.5 10/12/2020 1526   AST 16 08/25/2022 0325   ALT 12 08/25/2022 0325   ALKPHOS 74 08/25/2022 0325   BILITOT 0.4 08/25/2022 0325   BILITOT 0.3 10/12/2020 1526   GFRNONAA >60 08/25/2022 0325   GFRAA 123 10/12/2020 1526   Lipase     Component Value Date/Time   LIPASE 39 08/16/2022 0506    Studies/Results: CT GUIDED VISCERAL FLUID DRAIN BY PERC CATH  Result Date: 08/24/2022 INDICATION: 61 year old male with fluid and gas collection in left retroperitoneal space concerning for abscess. EXAM: CT GUIDED DRAINAGE OF retroperitoneal ABSCESS TECHNIQUE: Multidetector CT imaging of the abdomen was performed following the standard protocol without IV contrast. RADIATION DOSE REDUCTION: This exam was performed according to the departmental dose-optimization program which includes automated exposure control, adjustment of the mA and/or kV according to patient size and/or use of iterative reconstruction technique. MEDICATIONS: The patient is currently admitted to the hospital and receiving intravenous antibiotics. The antibiotics were administered within an appropriate time frame prior to the initiation of the procedure. ANESTHESIA/SEDATION: Moderate (conscious) sedation was employed during this procedure. A total of Versed mg and Fentanyl mcg was administered intravenously by the radiology nurse. Total intra-service moderate Sedation  Time: minutes. The patient's level of consciousness and vital signs were monitored continuously by radiology nursing throughout the procedure under my direct supervision. COMPLICATIONS: None immediate. TECHNIQUE: Informed written consent was obtained from the patient after a thorough discussion of the procedural risks, benefits and alternatives. All questions were addressed. Maximal Sterile Barrier Technique was utilized including caps, mask, sterile gowns, sterile gloves, sterile drape, hand hygiene and skin antiseptic. A timeout  was performed prior to the initiation of the procedure. PROCEDURE: The operative field was prepped with Chlorhexidine in a sterile fashion, and a sterile drape was applied covering the operative field. A sterile gown and sterile gloves were used for the procedure. Local anesthesia was provided with 1% Lidocaine. FINDINGS: A planning axial CT scan was performed. Fluid and gas collection in the left retroperitoneal space posterior to the psoas muscle was successfully identified. A suitable skin entry site was selected and marked. The skin was sterilely prepped and draped in the standard fashion with chlorhexidine skin prep. Local anesthesia was attained by infiltration with 1% lidocaine. A small dermatotomy was made. Under intermittent CT guidance, an 18 gauge trocar needle was advanced into the fluid and gas collection. A 0.035 wire was then coiled in the collection. The skin tract was dilated to twelve [place]. A Cook 12 [place] all-purpose drainage catheter was advanced over the wire and formed. Aspiration yields 140 mL of purulent foul-smelling fluid. Samples were sent for Gram stain and culture. The abscess cavity was then lavaged with saline and the drain secured to the skin with 0 Prolene suture. The drain was connected to JP bulb suction. IMPRESSION: Successful placement of 12 French abscess drain with evacuation of 140 mL purulent fluid. Samples were sent for Gram stain and culture. Electronically Signed   By: Jacqulynn Cadet M.D.   On: 08/24/2022 16:54   CT ABDOMEN PELVIS WO CONTRAST  Result Date: 08/23/2022 CLINICAL DATA:  Stercoral colitis with ulcer and perforation. Postop from sigmoid colectomy. EXAM: CT ABDOMEN AND PELVIS WITHOUT CONTRAST TECHNIQUE: Multidetector CT imaging of the abdomen and pelvis was performed following the standard protocol without IV contrast. RADIATION DOSE REDUCTION: This exam was performed according to the departmental dose-optimization program which includes automated  exposure control, adjustment of the mA and/or kV according to patient size and/or use of iterative reconstruction technique. COMPARISON:  08/16/2022 FINDINGS: Lower chest: Increased infiltrate or atelectasis in left lower lobe and tiny left pleural effusion. Small pericardial effusion has also increased in size. Hepatobiliary: No mass visualized on this unenhanced exam. Gallbladder is unremarkable. No evidence of biliary ductal dilatation. Pancreas: No mass or inflammatory process visualized on this unenhanced exam. Spleen:  Within normal limits in size. Adrenals/Urinary tract: Mild right renal parenchymal scarring noted. 3 mm left renal calculus is seen. Mild to moderate left hydronephrosis has increased since previous study, and appears to be due to retroperitoneal inflammatory change along the medial aspect of the left psoas. No evidence of ureteral calculi or dilatation. Small bowel gas is noted in the urinary bladder, likely due to recent catheterization. Stomach/Bowel: Postop changes are seen from sigmoid colectomy with left lower quadrant colostomy since prior study. Catheter is seen within the pelvis. No evidence of bowel obstruction. An extraperitoneal fluid and gas collection is seen in the left lower quadrant lateral to the psoas muscle which measures 7.0 x 6.2 cm. No other abnormal fluid collections are identified. Vascular/Lymphatic: No pathologically enlarged lymph nodes identified. No evidence of abdominal aortic aneurysm. Aortic atherosclerotic calcification incidentally noted. Reproductive:  No mass or other significant abnormality. Other:  None. Musculoskeletal:  No suspicious bone lesions identified. IMPRESSION: Postop changes from sigmoid colectomy with left lower quadrant colostomy. 7 cm extraperitoneal collection in left lower quadrant lateral to the psoas muscle, suspicious for abscess. Increased mild to moderate left hydronephrosis, due to inflammatory change in the left retroperitoneum. No  evidence of ureteral calculi or dilatation. Tiny nonobstructing left renal calculus. Left lower lobe atelectasis or infiltrate and tiny left pleural effusion. Small pericardial effusion also present. Electronically Signed   By: Marlaine Hind M.D.   On: 08/23/2022 21:15    Anti-infectives: Anti-infectives (From admission, onward)    Start     Dose/Rate Route Frequency Ordered Stop   08/17/22 1630  piperacillin-tazobactam (ZOSYN) IVPB 3.375 g        3.375 g 12.5 mL/hr over 240 Minutes Intravenous Every 8 hours 08/17/22 1540     08/16/22 1500  piperacillin-tazobactam (ZOSYN) IVPB 3.375 g        3.375 g 100 mL/hr over 30 Minutes Intravenous STAT 08/16/22 1446 08/16/22 2032   08/16/22 0615  piperacillin-tazobactam (ZOSYN) IVPB 3.375 g        3.375 g 100 mL/hr over 30 Minutes Intravenous  Once 08/16/22 0609 08/16/22 0713        Assessment/Plan POD 9 ex lap partial sigmoid colectomy, colostomy by Dr. Oda Cogan on 9/15 for Perforated stercoral ulcer - History of chronic pain on suboxone. Continue PCA.  - Cont.TPN - started 9/19 pm. Ok CLD - CT w/ extraperitoneal fluid collection in LLQ lateral to psoas. S/p IR drain 9/23. Cx pending. Cont abx - Cont drain - Mepitel at base of midline with dry to dry dressings over bid - WOC RN following for new ostomy - Encourage ambulation and IS use   FEN - CLD, IVFs, TPN VTE - SCDs, lovenox ID - zosyn 9/14> Foley - out 9/18. voiding   Per primary Tobacco abuse/COPD - 2 ppd H/O substance abuse H/O opioid dependence - now on suboxone prn H/O seizure d/o Chronic pancreatitis Zollinger-Ellison syndrome GERD Hypoalbuminemic - appears malnourished at baseline due to muscle wasting RA - mtx and enbrel held   LOS: 9 days    Jillyn Ledger , [facility] 08/25/2022, 8:44 AM Please see Amion for pager number during day hours 7:00am-4:30pm

## 2022-08-25 NOTE — Progress Notes (Signed)
PHARMACY - TOTAL PARENTERAL NUTRITION CONSULT NOTE   Indication: Prolonged ileus  Patient Measurements: Height: 5' 5.5" (166.4 cm) Weight: 52.6 kg (116 lb) IBW/kg (Calculated) : 62.65 TPN AdjBW (KG): 52.6 Body mass index is 19.01 kg/m.  Usual Weight: 115 lbs  Assessment:  61 yo male with a history of chronic pain syndrome on Suboxone, chronic pancreatitis with prior surgery for pseudocyst removal in the 1990s, prior BII in the 1980s, COPD, rheumatoid arthritis on Enbrel, history of seizures not on an AED, and Zollinger-Ellison syndrome who presented with acute onset of abdominal pain along with N/V. He was found to have a WBC of 22K, lactic acid of 3.1 and a CT scan concerning for a colonic perforation. Pharmacy consulted to initiate TPN for prolonged ileus.  Glucose / Insulin: no hx DM, CBGs controlled except one serum glu 195; x1 decadron dose 9/15 Electrolytes: Na 135, K 4.4, Cl 102, CoCa 9.9. Other electrolytes wnl.  Renal: Scr 0.54 (bsl < 1); BUN wnl Hepatic: LFTs wnl. Albumin 1.9. TG 119.  Intake / Output; MIVF: UOP 1.2 ml/kg/hr; JP drain 210 ml. 0 ml stool.   GI Imaging: 9/15 colonic perforation in the region of the sigmoid colon likely secondary to impacted stool GI Surgeries / Procedures:  9/15 Exploratory laparotomy, Left colostomy and partial sigmoid colectomy  9/23: drain placed for L retroperitoneal abscess (139m fluid aspirated)  Central access: 08/20/22 TPN start date:  08/20/22  Nutritional Goals: Goal TPN rate is 70 mL/hr (provides 87 g of protein and 1703 kcals per day)  RD Assessment: Estimated Needs Total Energy Estimated Needs: 1700-1900 Total Protein Estimated Needs: 85-100 grams Total Fluid Estimated Needs: >/= 1.7 L   Current Nutrition:  9/19 NPO   Plan:  Continue TPN at goal rate of 726mhr to provide 87g AA and 1703 kcal Electrolytes in TPN: Decrease K to 45 mEq/L. Continue Na 150 mEq/L, Ca 5 mEq/L, Mg 10 mEq/L. phos 15 mmol/L. Continue Cl:Ac  1:1.  Add standard MVI and trace elements to TPN Monitor CBGs - continue off SSI since CBGs controlled - might need to add SSI if other CBG elevated above goal  Monitor TPN labs on Mon/Thurs, as needed Follow up toleration and advancement of diet - starting CLD today   Thank you for allowing pharmacy to be a part of this patient's care.  GrCristela FeltPharmD, BCPS Clinical Pharmacist 08/25/2022 7:36 AM

## 2022-08-25 NOTE — Progress Notes (Signed)
Pharmacy Antibiotic Note  Jerry Knight is a 61 y.o. male admitted on 08/16/2022 with intra-abdominal infection. Pharmacy has been consulted for Zosyn dosing.  Patient underwent emergent exploratory laparotomy, left colostomy and partial sigmoid colectomy on 9/15. Now s/p IR drain 9/23. SCr 0.5 - stable, (WBC down 21.1>15.2, afebrile.   Plan: Zosyn 3.375g IV q8h (4 hour infusion). Monitor clinical course, cultures, renal function, WBC, fevers.  De-escalate when able.   Height: 5' 5.5" (166.4 cm) Weight: 52.6 kg (116 lb) IBW/kg (Calculated) : 62.65  Temp (24hrs), Avg:98.4 F (36.9 C), Min:98.3 F (36.8 C), Max:98.5 F (36.9 C)  Recent Labs  Lab 08/20/22 0040 08/21/22 0307 08/22/22 0418 08/23/22 0317 08/24/22 0356 08/25/22 0325  WBC 13.3* 15.4* 15.9*  --  21.1* 15.2*  CREATININE 0.62 0.49* 0.49* 0.51* 0.56* 0.54*    Estimated Creatinine Clearance: 74 mL/min (A) (by C-G formula based on SCr of 0.54 mg/dL (L)).    Allergies  Allergen Reactions   Ambien [Zolpidem Tartrate] Other (See Comments)    Black out   Iohexol     Pt claims he has chest tightness and sob after iv contrast injections.   Lunesta [Eszopiclone] Other (See Comments)   Seroquel [Quetiapine Fumarate]     Blackout   Sonata [Zaleplon]     "black out'  confused    Antimicrobials this admission: Zosyn 9/15 x 1, 9/16 >>   Microbiology results: 9/23 Abscess Cx: pending  9/15 BCx: ngtdF   Thank you for allowing pharmacy to be a part of this patient's care.  Vance Peper, PharmD PGY-2 Pharmacy Resident Phone [phone] 08/25/2022 9:11 AM   Please check AMION for all [facility] phone numbers After 10:00 PM, call [facility] [phone]

## 2022-08-25 NOTE — Progress Notes (Signed)
PROGRESS NOTE  CHANTRY HEADEN JXB:[id number] DOB: [birth year] DOA: 08/16/2022 PCP: Beverley Fiedler, FNP   LOS: 9 days   Brief Narrative / Interim history: Jerry Knight is a 61 y.o. male with medical history significant of  COPD, chronic pancreatitis, rheumatoid arthritis on Enbrel, history of seizures not on an AED, chronic pain on Suboxone, polysubstance abuse, Zollinger-Ellison syndrome, and constipation who presented with complaints of  lower abdominal pain. Imaging remarkable for colonic perforation/obstruction  Patient was admitted for further work-up  Subjective / 24h Interval events: Ongoing abdominal soreness.  Assesement and Plan: Principal Problem:   Bowel perforation (HCC) Active Problems:   Sepsis ([place])   Normocytic anemia   Hypokalemia   COPD (chronic obstructive pulmonary disease) (HCC)   Rheumatoid arthritis (HCC)   Anxiety   GERD (gastroesophageal reflux disease)   Chronic neck and back pain   Tobacco abuse   Protein-calorie malnutrition, severe   Principal problem Sepsis secondary to bowel perforation ([place]) due to stercoral ulcer, intra-abdominal abscess-on admission, CT revealed colonic perforation in the region of the sigmoid colon thought secondary to impacted stool. Met sepsis criteria on admission with tachycardia, tachypnea, leukocytosis, lactic acidosis.  General surgery was consulted and he underwent emergent exploratory laparotomy, left colostomy and partial sigmoid colectomy on 9/15.  Repeat CT scan 9/22 with a new 7 cm fluid collection in the left lower quadrant.  Status post drain placement by IR 9/23.  Primary culture shows abundant E. coli.  Continue Zosyn.  White count improved -Continue clear liquid, TPN, ostomy still with no significant output.  Continue pain control with oxycodone/morphine PCA  Active problems Normocytic anemia -Likely anemia of chronic disease -Hemoglobin overall stable   Hypokalemia, hyponatremia -K normal.  Magnesium  1.8 -Continue TPN    COPD, without exacerbation - Chest x-ray noted chronic interstitial lung changes with bibasilar atelectasis. - Albuterol nebs as needed   History of rheumatoid arthritis -Outpatient on Enbrel 50 mg and methotrexate 10 mg on Saturdays -Currently regimen on hold   Insomnia, anxiety -For now continue Ativan as needed for anxiety -Continue trazodone 150 mg at bedtime    GERD -Continue PPI   Chronic pain  -Outpatient on Suboxone, currently on PCA and as needed oxycodone, tizanidine -continue Lidoderm patch for the lower back   Tobacco abuse -Patient reportedly smokes 2 packs of cigarettes per day on average. - cont Nicotine patch   Moderate to severe protein calorie malnutrition due to acute illness, hypoalbuminemia 1.9 -Estimated body mass index is 19.01.  Continue TPN  Scheduled Meds:  acetaminophen  1,000 mg Oral Q6H   Chlorhexidine Gluconate Cloth  6 each Topical Daily   enoxaparin (LOVENOX) injection  40 mg Subcutaneous Q24H   lidocaine  1 patch Transdermal Q24H   morphine   Intravenous Q4H   nicotine  21 mg Transdermal QHS   pantoprazole (PROTONIX) IV  40 mg Intravenous Q12H   sodium chloride flush  3 mL Intravenous Q12H   sodium chloride flush  5 mL Intracatheter Q8H   tiZANidine  2 mg Oral Q8H   traZODone  150 mg Oral QHS   Continuous Infusions:  dextrose 5 % and 0.45% NaCl 10 mL/hr at 08/24/22 0603   piperacillin-tazobactam (ZOSYN)  IV 3.375 g (08/25/22 0626)   TPN ADULT (ION) 70 mL/hr at 08/24/22 1805   TPN ADULT (ION)     PRN Meds:.albuterol, diphenhydrAMINE **OR** diphenhydrAMINE, LORazepam, naloxone **AND** sodium chloride flush, ondansetron **OR** ondansetron (ZOFRAN) IV, oxyCODONE, phenol, sodium chloride flush  Current Outpatient Medications  Medication Instructions   amitriptyline (ELAVIL) 100 mg, Oral, Daily at bedtime   atorvastatin (LIPITOR) 20 mg, Oral, Daily   buprenorphine-naloxone (SUBOXONE) 8-2 mg SUBL SL tablet 1 tablet,  Sublingual, Daily   cyclobenzaprine (FLEXERIL) 10 mg, Oral, 3 times daily   Enbrel 50 mg, Subcutaneous, Weekly   folic acid (FOLVITE) 1 mg, Oral, Daily   linaclotide (LINZESS) 145 mcg, Oral, Daily before breakfast   LORazepam (ATIVAN) 1 mg, Oral, 3 times daily   methotrexate (RHEUMATREX) 10 mg, Oral, Weekly   mirtazapine (REMERON) 45 mg, Oral, Daily at bedtime   omeprazole (PRILOSEC) 20 MG capsule TAKE 1 CAPSULE(20 MG) BY MOUTH DAILY   traZODone (DESYREL) 150-200 mg, Oral, Daily at bedtime    Diet Orders (From admission, onward)     Start     Ordered   08/25/22 0848  Diet clear liquid Room service appropriate? Yes; Fluid consistency: Thin  Diet effective now       Question Answer Comment  Room service appropriate? Yes   Fluid consistency: Thin      08/25/22 0847            DVT prophylaxis: enoxaparin (LOVENOX) injection 40 mg Start: 08/25/22 1000 SCD's Start: 08/16/22 1754 SCDs Start: 08/16/22 1600   Lab Results  Component Value Date   PLT 380 08/25/2022      Code Status: Full Code  Family Communication: no family at bedside   Status is: Inpatient Remains inpatient appropriate because: Severity of illness   Level of care: Telemetry Surgical  Objective: Vitals:   08/24/22 2300 08/25/22 0258 08/25/22 0757 08/25/22 0936  BP:   118/75   Pulse:   87   Resp: _0 Temp:   98.5 F (36.9 C)   TempSrc:   Oral   SpO2: 97% 97% 100% 100%  Weight:      Height:        Intake/Output Summary (Last 24 hours) at 08/25/2022 1103 Last data filed at 08/25/2022 0758 Gross per 24 hour  Intake 500 ml  Output 1945 ml  Net -1445 ml    Wt Readings from Last 3 Encounters:  08/16/22 52.6 kg  07/27/22 53.5 kg  11/09/21 52.2 kg    Examination:  Constitutional: NAD Eyes: lids and conjunctivae normal, no scleral icterus ENMT: mmm Neck: normal, supple Respiratory: clear to auscultation bilaterally, no wheezing, no crackles. Normal respiratory effort.   Cardiovascular: Regular rate and rhythm, no murmurs / rubs / gallops. Abdomen: soft, no distention, no tenderness. Bowel sounds positive.  Skin: no rashes Neurologic: no focal deficits, equal strength  Data Reviewed: I have independently reviewed following labs and imaging studies   CBC Recent Labs  Lab 08/20/22 0040 08/21/22 0307 08/22/22 0418 08/24/22 0356 08/25/22 0325  WBC 13.3* 15.4* 15.9* 21.1* 15.2*  HGB 10.5* 10.3* 10.2* 9.0* 9.1*  HCT 29.8* 30.1* 29.6* 27.2* 27.1*  PLT 180 187 217 328 380  MCV 89.0 90.9 91.4 92.8 92.2  MCH 31.3 31.1 31.5 30.7 31.0  MCHC 35.2 34.2 34.5 33.1 33.6  RDW 13.6 13.8 14.0 14.4 14.6     Recent Labs  Lab 08/21/22 0307 08/22/22 0418 08/23/22 0317 08/24/22 0356 08/24/22 1415 08/25/22 0325  NA 132* 131* 132* 131*  --  135  K 3.2* 3.9 4.0 4.0  --  4.4  CL 99 96* 99 100  --  102  CO2 _1 --  26  GLUCOSE 130* 119* 143* 120*  --  195*  BUN <5* _0 --  9  CREATININE 0.49* 0.49* 0.51* 0.56*  --  0.54*  CALCIUM 7.8* 8.2* 8.1* 8.1*  --  8.2*  AST 14* 15  --   --   --  16  ALT 10 10  --   --   --  12  ALKPHOS 42 47  --   --   --  74  BILITOT 0.7 0.6  --   --   --  0.4  ALBUMIN 1.9* 1.9*  --   --   --  1.8*  MG 1.7 1.7  --  1.8  --   --   INR  --   --   --   --  1.1  --      ------------------------------------------------------------------------------------------------------------------ No results for input(s): "CHOL", "HDL", "LDLCALC", "TRIG", "CHOLHDL", "LDLDIRECT" in the last 72 hours.   Lab Results  Component Value Date   HGBA1C 5.8 (H) 10/12/2020   ------------------------------------------------------------------------------------------------------------------ No results for input(s): "TSH", "T4TOTAL", "T3FREE", "THYROIDAB" in the last 72 hours.  Invalid input(s): "FREET3"  Cardiac Enzymes No results for input(s): "CKMB", "TROPONINI", "MYOGLOBIN" in the last 168 hours.  Invalid input(s):  "CK" ------------------------------------------------------------------------------------------------------------------ No results found for: "BNP"  CBG: Recent Labs  Lab 08/23/22 0806 08/23/22 2137 08/24/22 0755 08/24/22 2010 08/25/22 0752  GLUCAP 142* 124* 124* 109* 116*     Recent Results (from the past 240 hour(s))  Culture, blood (routine x 2)     Status: None   Collection Time: 08/16/22  6:55 AM   Specimen: BLOOD  Result Value Ref Range Status   Specimen Description   Final    BLOOD RIGHT ANTECUBITAL Performed at [facility], [address] [place], [place] [address]    Special Requests   Final    Blood Culture results may not be optimal due to an inadequate volume of blood received in culture bottles [place] Performed at [facility], [address], [place], [place] [address]    Culture   Final    NO GROWTH 5 DAYS Performed at [facility], [place] [address] [place], [place] [address]    Report Status 08/21/2022 FINAL  Final  Culture, blood (routine x 2)     Status: None   Collection Time: 08/16/22  7:08 AM   Specimen: BLOOD RIGHT HAND  Result Value Ref Range Status   Specimen Description   Final    BLOOD RIGHT HAND Performed at [facility], [address] [place], [place] [address]    Special Requests   Final    BOTTLES DRAWN AEROBIC AND ANAEROBIC Blood Culture adequate volume Performed at [facility], [address] [place], [place] [address]    Culture   Final    NO GROWTH 5 DAYS Performed at [facility], [place] [address] [place], [place] [address]    Report Status 08/21/2022 FINAL  Final  Aerobic/Anaerobic Culture w Gram Stain (surgical/deep wound)     Status: None (Preliminary result)   Collection Time: 08/24/22  3:50 PM   Specimen: Abscess  Result Value Ref Range Status   Specimen Description ABSCESS  Final   Special Requests Normal  Final   Gram Stain   Final     ABUNDANT WBC PRESENT, PREDOMINANTLY PMN RARE GRAM NEGATIVE RODS RARE GRAM POSITIVE RODS    Culture   Final    ABUNDANT  ESCHERICHIA COLI SUSCEPTIBILITIES TO FOLLOW Performed at [facility], [place] [address], [place], [place] [address]    Report Status PENDING  Incomplete     Radiology Studies: CT GUIDED VISCERAL FLUID DRAIN BY PERC CATH  Result Date: 08/24/2022 INDICATION: 61 year old male with fluid and gas collection in left retroperitoneal space concerning for abscess. EXAM: CT GUIDED DRAINAGE OF retroperitoneal ABSCESS TECHNIQUE: Multidetector CT imaging of the abdomen was performed following the standard protocol without IV contrast. RADIATION DOSE REDUCTION: This exam was performed according to the departmental dose-optimization program which includes automated exposure control, adjustment of the mA and/or kV according to patient size and/or use of iterative reconstruction technique. MEDICATIONS: The patient is currently admitted to the hospital and receiving intravenous antibiotics. The antibiotics were administered within an appropriate time frame prior to the initiation of the procedure. ANESTHESIA/SEDATION: Moderate (conscious) sedation was employed during this procedure. A total of Versed mg and Fentanyl mcg was administered intravenously by the radiology nurse. Total intra-service moderate Sedation Time: minutes. The patient's level of consciousness and vital signs were monitored continuously by radiology nursing throughout the procedure under my direct supervision. COMPLICATIONS: None immediate. TECHNIQUE: Informed written consent was obtained from the patient after a thorough discussion of the procedural risks, benefits and alternatives. All questions were addressed. Maximal Sterile Barrier Technique was utilized including caps, mask, sterile gowns, sterile gloves, sterile drape, hand hygiene and skin antiseptic. A timeout was performed prior to the initiation of the procedure.  PROCEDURE: The operative field was prepped with Chlorhexidine in a sterile fashion, and a sterile drape was applied covering the operative field. A sterile gown and sterile gloves were used for the procedure. Local anesthesia was provided with 1% Lidocaine. FINDINGS: A planning axial CT scan was performed. Fluid and gas collection in the left retroperitoneal space posterior to the psoas muscle was successfully identified. A suitable skin entry site was selected and marked. The skin was sterilely prepped and draped in the standard fashion with chlorhexidine skin prep. Local anesthesia was attained by infiltration with 1% lidocaine. A small dermatotomy was made. Under intermittent CT guidance, an 18 gauge trocar needle was advanced into the fluid and gas collection. A 0.035 wire was then coiled in the collection. The skin tract was dilated to twelve [place]. A Cook 12 [place] all-purpose drainage catheter was advanced over the wire and formed. Aspiration yields 140 mL of purulent foul-smelling fluid. Samples were sent for Gram stain and culture. The abscess cavity was then lavaged with saline and the drain secured to the skin with 0 Prolene suture. The drain was connected to JP bulb suction. IMPRESSION: Successful placement of 12 French abscess drain with evacuation of 140 mL purulent fluid. Samples were sent for Gram stain and culture. Electronically Signed   By: Jacqulynn Cadet M.D.   On: 08/24/2022 16:54     Marzetta Board, MD, PhD [facility]  Between 7 am - 7 pm I am available, please contact me via Amion (for emergencies) or Securechat (non urgent messages)  Between 7 pm - 7 am I am not available, please contact night coverage MD/APP via Amion

## 2022-08-26 DIAGNOSIS — F419 Anxiety disorder, unspecified: Secondary | ICD-10-CM | POA: Diagnosis not present

## 2022-08-26 DIAGNOSIS — K631 Perforation of intestine (nontraumatic): Secondary | ICD-10-CM | POA: Diagnosis not present

## 2022-08-26 DIAGNOSIS — M542 Cervicalgia: Secondary | ICD-10-CM | POA: Diagnosis not present

## 2022-08-26 DIAGNOSIS — J410 Simple chronic bronchitis: Secondary | ICD-10-CM | POA: Diagnosis not present

## 2022-08-26 LAB — GLUCOSE, CAPILLARY
Glucose-Capillary: 101 mg/dL — ABNORMAL HIGH (ref 70–99)
Glucose-Capillary: 105 mg/dL — ABNORMAL HIGH (ref 70–99)
Glucose-Capillary: 112 mg/dL — ABNORMAL HIGH (ref 70–99)
Glucose-Capillary: 126 mg/dL — ABNORMAL HIGH (ref 70–99)

## 2022-08-26 LAB — COMPREHENSIVE METABOLIC PANEL
ALT: 13 U/L (ref 0–44)
AST: 18 U/L (ref 15–41)
Albumin: 2 g/dL — ABNORMAL LOW (ref 3.5–5.0)
Alkaline Phosphatase: 82 U/L (ref 38–126)
Anion gap: 9 (ref 5–15)
BUN: 10 mg/dL (ref 6–20)
CO2: 24 mmol/L (ref 22–32)
Calcium: 8.6 mg/dL — ABNORMAL LOW (ref 8.9–10.3)
Chloride: 102 mmol/L (ref 98–111)
Creatinine, Ser: 0.52 mg/dL — ABNORMAL LOW (ref 0.61–1.24)
GFR, Estimated: >60 mL/min (ref >60)
Glucose, Bld: 122 mg/dL — ABNORMAL HIGH (ref 70–99)
Potassium: 4.1 mmol/L (ref 3.5–5.1)
Sodium: 135 mmol/L (ref 135–145)
Total Bilirubin: 0.5 mg/dL (ref 0.3–1.2)
Total Protein: 7 g/dL (ref 6.5–8.1)

## 2022-08-26 LAB — CBC
HCT: 30.1 % — ABNORMAL LOW (ref 39.0–52.0)
Hemoglobin: 9.5 g/dL — ABNORMAL LOW (ref 13.0–17.0)
MCH: 30.1 pg (ref 26.0–34.0)
MCHC: 31.6 g/dL (ref 30.0–36.0)
MCV: 95.3 fL (ref 80.0–100.0)
Platelets: 457 10*3/uL — ABNORMAL HIGH (ref 150–400)
RBC: 3.16 MIL/uL — ABNORMAL LOW (ref 4.22–5.81)
RDW: 14.5 % (ref 11.5–15.5)
WBC: 15.3 10*3/uL — ABNORMAL HIGH (ref 4.0–10.5)
nRBC: 0 % (ref 0.0–0.2)

## 2022-08-26 LAB — TRIGLYCERIDES: Triglycerides: 132 mg/dL (ref <150)

## 2022-08-26 LAB — MAGNESIUM: Magnesium: 1.9 mg/dL (ref 1.7–2.4)

## 2022-08-26 LAB — PHOSPHORUS: Phosphorus: 3.8 mg/dL (ref 2.5–4.6)

## 2022-08-26 MED ORDER — TRAVASOL 10 % IV SOLN
INTRAVENOUS | Status: AC
  Filled 2022-08-26: qty 873.6

## 2022-08-26 NOTE — Progress Notes (Signed)
PHARMACY - TOTAL PARENTERAL NUTRITION CONSULT NOTE   Indication: Prolonged ileus  Patient Measurements: Height: 5' 5.5" (166.4 cm) Weight: 52.6 kg (116 lb) IBW/kg (Calculated) : 62.65 TPN AdjBW (KG): 52.6 Body mass index is 19.01 kg/m.  Usual Weight: 115 lbs  Assessment:  61 yo male with a history of chronic pain syndrome on Suboxone, chronic pancreatitis with prior surgery for pseudocyst removal in the 1990s, prior BII in the 1980s, COPD, rheumatoid arthritis on Enbrel, history of seizures not on an AED, and Zollinger-Ellison syndrome who presented with acute onset of abdominal pain along with N/V. He was found to have a WBC of 22K, lactic acid of 3.1 and a CT scan concerning for a colonic perforation. Pharmacy consulted to initiate TPN for prolonged ileus.  Glucose / Insulin: no hx DM, CBGs controlled   Electrolytes: Na 135, K 4.1, Cl 102, CoCa 9.9. Other electrolytes wnl.  Renal: Scr 0.54 (bsl < 1); BUN wnl Hepatic: LFTs wnl. Albumin 2. TG 132  Intake / Output; MIVF: UOP 1.3 ml/kg/hr; JP drain 30 ml. 0 ml stool.   GI Imaging: 9/15 colonic perforation in the region of the sigmoid colon likely secondary to impacted stool GI Surgeries / Procedures:  9/15 Exploratory laparotomy, Left colostomy and partial sigmoid colectomy  9/23: drain placed for L retroperitoneal abscess (141m fluid aspirated)  Central access: 08/20/22 TPN start date:  08/20/22  Nutritional Goals: Goal TPN rate is 70 mL/hr (provides 87 g of protein and 1703 kcals per day)  RD Assessment: Estimated Needs Total Energy Estimated Needs: 1700-1900 Total Protein Estimated Needs: 85-100 grams Total Fluid Estimated Needs: >/= 1.7 L   Current Nutrition:  9/19 NPO 9/24 CLD  Plan:  Continue TPN at goal rate of 782mhr to provide 87g AA and 1703 kcal Electrolytes in TPN: Decrease K to 45 mEq/L. Continue Na 150 mEq/L, Ca 5 mEq/L, Mg 10 mEq/L. phos 15 mmol/L. Continue Cl:Ac 1:1.  Add standard MVI and trace elements  to TPN Monitor CBGs - continue off SSI since CBGs controlled Monitor TPN labs on Mon/Thurs, as needed Follow up toleration and advancement of diet -  CLD   Thank you for allowing pharmacy to be a part of this patient's care.  CaAlanda SlimPharmD, FCKu [facility] Center LLClinical Pharmacist Please see AMION for all Pharmacists' Contact Phone Numbers 08/26/2022, 8:23 AM

## 2022-08-26 NOTE — Consult Note (Addendum)
WOC Nurse ostomy follow up Patient receiving care in [facility] 6N18 Pouch changed with bedside nurse this AM. No need to change today. Reviewed frequency of emptying pouch and procedure for changing the pouch and how often. Explained SS kit that has been ordered and should be arriving at the patients home address. Patient states he is feeling very comfortable with ostomy care. WOC will follow up again on Thursday for pouch change and teaching session.  WOC will continue to follow    L. , MSN, RN, CMSRN, AGCNS, WTA Wound Treatment Associate Pager [phone]  

## 2022-08-26 NOTE — Progress Notes (Signed)
[facility]                                                                              Jerry Knight, is a 61 y.o. male, DOB - 01/03/1961, GNF:[id number] Admit date - 08/16/2022    Outpatient Primary MD for the patient is Jerry Knight, Jerry Knight, [place]  LOS - 10  days  Chief Complaint  Patient presents with   Abdominal Pain       Brief summary   Jerry Knight is a 61 y.o. male with medical history significant of  COPD, chronic pancreatitis, rheumatoid arthritis on Enbrel, history of seizures not on an AED, chronic pain on Suboxone, polysubstance abuse, Zollinger-Ellison syndrome, and constipation who presented with complaints of  lower abdominal pain.  Imaging remarkable for colonic perforation/obstruction  Patient was admitted for further work-up  Assessment & Plan    Principal Problem: Sepsis secondary to bowel perforation ([place]) due to stercoral ulcer, left  retroperitoneal abscess -CT revealed colonic perforation in the region of the sigmoid colon thought secondary to impacted stool.  -Met sepsis criteria on admission with tachycardia, tachypnea, leukocytosis, lactic acidosis -Surgery consulted, underwent emergent exploratory laparotomy, left colostomy and partial sigmoid colectomy on 9/15 -Patient was placed on Zosyn, TPN, NGT removed on 9/18  -Repeat CT on 9/22 with new 7 cm fluid collection in the LLQ s/p drain placement by IR on 9/23, cultures showing abundant E. Coli -Continue IV Zosyn -Diet advanced to full liquids, continue TPN.  Still little/no output from the colostomy -Surgery following closely  Normocytic anemia -Likely anemia of chronic disease -Patient is stable, hemoglobin 9.5  Hypokalemia, hyponatremia -K normal  -Continue TPN    COPD, without exacerbation - Chest x-ray noted chronic interstitial lung changes with bibasilar atelectasis. - Albuterol nebs as needed   History of rheumatoid arthritis -Outpatient on Enbrel 50 mg and  methotrexate 10 mg on Saturdays -Currently regimen on hold  Insomnia, anxiety -For now continue Ativan as needed for anxiety -Continue trazodone 150 mg at bedtime    GERD -Continue PPI   Chronic pain  -Outpatient on Suboxone, currently on PCA and as needed oxycodone, tizanidine -continue Lidoderm patch for the lower back   Tobacco abuse Patient reportedly smokes 2 packs of cigarettes per day on average. - cont Nicotine patch  Moderate to severe protein calorie malnutrition due to acute illness, hypoalbuminemia 1.9, muscle wasting Estimated body mass index is 19.01 kg/m as calculated from the following:   Height as of this encounter: 5' 5.5" (1.664 m).   Weight as of this encounter: 52.6 kg. -Currently on TPN  Code Status: Full code DVT Prophylaxis:  enoxaparin (LOVENOX) injection 40 mg Start: 08/25/22 1000 SCD's Start: 08/16/22 1754 SCDs Start: 08/16/22 1600   Level of Care: Level of care: Telemetry Surgical Family Communication: updated patient's wife at the bedside today  Disposition Plan:      Remains inpatient appropriate: Not stable for discharge   Procedures:  9/15 : ex lap partial sigmoid colectomy, colostomy  9/23: Drain into left retroperitoneal abscess with 140 mL purulent fluid aspirated  Consultants:   General surgery Interventional radiology  Antimicrobials:  Anti-infectives (From admission, onward)    Start     Dose/Rate Route Frequency Ordered Stop   08/17/22 1630  piperacillin-tazobactam (ZOSYN) IVPB 3.375 g        3.375 g 12.5 mL/hr over 240 Minutes Intravenous Every 8 hours 08/17/22 1540     08/16/22 1500  piperacillin-tazobactam (ZOSYN) IVPB 3.375 g        3.375 g 100 mL/hr over 30 Minutes Intravenous STAT 08/16/22 1446 08/16/22 2032   08/16/22 0615  piperacillin-tazobactam (ZOSYN) IVPB 3.375 g        3.375 g 100 mL/hr over 30 Minutes Intravenous  Once 08/16/22 0609 08/16/22 0713          Medications  acetaminophen  1,000 mg Oral  Q6H   Chlorhexidine Gluconate Cloth  6 each Topical Daily   enoxaparin (LOVENOX) injection  40 mg Subcutaneous Q24H   lidocaine  1 patch Transdermal Q24H   morphine   Intravenous Q4H   nicotine  21 mg Transdermal QHS   pantoprazole (PROTONIX) IV  40 mg Intravenous Q12H   sodium chloride flush  3 mL Intravenous Q12H   sodium chloride flush  5 mL Intracatheter Q8H   tiZANidine  2 mg Oral Q8H   traZODone  150 mg Oral QHS      Subjective:   Jerry Knight was seen and examined today.  Still little to no ostomy output.  Told to clear liquid diet.  No acute nausea or vomiting.  Pain controlled on PCA.  No fevers or chills. Objective:   Vitals:   08/26/22 0723 08/26/22 0816 08/26/22 0933 08/26/22 1124  BP: 107/65     Pulse: 94     Resp: _0 Temp: 98.6 F (37 C)     TempSrc: Oral     SpO2: 96%   98%  Weight:      Height:        Intake/Output Summary (Last 24 hours) at 08/26/2022 1315 Last data filed at 08/26/2022 1126 Gross per 24 hour  Intake 15 ml  Output 1185 ml  Net -1170 ml     Wt Readings from Last 3 Encounters:  08/16/22 52.6 kg  07/27/22 53.5 kg  11/09/21 52.2 kg   Physical Exam General: Alert and oriented x 3, NAD Cardiovascular: S1 S2 clear, RRR.  Respiratory: CTAB, no wheezing, rales Gastrointestinal: Midline dressing intact, colostomy with scant output in the bag, no stool.  Stoma pink and viable.  JP drain+.  IR drain+ Ext: no pedal edema bilaterally, muscle wasting Neuro: no new deficits Psych: Normal affect and demeanor, alert and oriented x3   Data Reviewed:  I have personally reviewed following labs    CBC Lab Results  Component Value Date   WBC 15.3 (H) 08/26/2022   RBC 3.16 (L) 08/26/2022   HGB 9.5 (L) 08/26/2022   HCT 30.1 (L) 08/26/2022   MCV 95.3 08/26/2022   MCH 30.1 08/26/2022   PLT 457 (H) 08/26/2022   MCHC 31.6 08/26/2022   RDW 14.5 08/26/2022   LYMPHSABS 1.1 08/16/2022   MONOABS 1.4 (H) 08/16/2022   EOSABS 0.0 08/16/2022    BASOSABS 0.0 25/95/6387     Last metabolic panel Lab Results  Component Value Date   NA 135 08/26/2022   K 4.1 08/26/2022   CL 102 08/26/2022   CO2 24 08/26/2022   BUN 10 08/26/2022   CREATININE 0.52 (L) 08/26/2022   GLUCOSE 122 (H) 08/26/2022   GFRNONAA >60 08/26/2022   GFRAA 123 10/12/2020  CALCIUM 8.6 (L) 08/26/2022   PHOS 3.8 08/26/2022   PROT 7.0 08/26/2022   ALBUMIN 2.0 (L) 08/26/2022   LABGLOB 3.0 10/12/2020   AGRATIO 1.5 10/12/2020   BILITOT 0.5 08/26/2022   ALKPHOS 82 08/26/2022   AST 18 08/26/2022   ALT 13 08/26/2022   ANIONGAP 9 08/26/2022    CBG (last 3)  Recent Labs    08/25/22 2115 08/26/22 0001 08/26/22 1153  GLUCAP 145* 112* 126*      Coagulation Profile: Recent Labs  Lab 08/24/22 1415  INR 1.1     Radiology Studies: I have personally reviewed the imaging studies  CT GUIDED VISCERAL FLUID DRAIN BY PERC CATH  Result Date: 08/24/2022 INDICATION: 61 year old male with fluid and gas collection in left retroperitoneal space concerning for abscess. EXAM: CT GUIDED DRAINAGE OF retroperitoneal ABSCESS TECHNIQUE: Multidetector CT imaging of the abdomen was performed following the standard protocol without IV contrast. RADIATION DOSE REDUCTION: This exam was performed according to the departmental dose-optimization program which includes automated exposure control, adjustment of the mA and/or kV according to patient size and/or use of iterative reconstruction technique. MEDICATIONS: The patient is currently admitted to the hospital and receiving intravenous antibiotics. The antibiotics were administered within an appropriate time frame prior to the initiation of the procedure. ANESTHESIA/SEDATION: Moderate (conscious) sedation was employed during this procedure. A total of Versed mg and Fentanyl mcg was administered intravenously by the radiology nurse. Total intra-service moderate Sedation Time: minutes. The patient's level of consciousness and vital signs  were monitored continuously by radiology nursing throughout the procedure under my direct supervision. COMPLICATIONS: None immediate. TECHNIQUE: Informed written consent was obtained from the patient after a thorough discussion of the procedural risks, benefits and alternatives. All questions were addressed. Maximal Sterile Barrier Technique was utilized including caps, mask, sterile gowns, sterile gloves, sterile drape, hand hygiene and skin antiseptic. A timeout was performed prior to the initiation of the procedure. PROCEDURE: The operative field was prepped with Chlorhexidine in a sterile fashion, and a sterile drape was applied covering the operative field. A sterile gown and sterile gloves were used for the procedure. Local anesthesia was provided with 1% Lidocaine. FINDINGS: A planning axial CT scan was performed. Fluid and gas collection in the left retroperitoneal space posterior to the psoas muscle was successfully identified. A suitable skin entry site was selected and marked. The skin was sterilely prepped and draped in the standard fashion with chlorhexidine skin prep. Local anesthesia was attained by infiltration with 1% lidocaine. A small dermatotomy was made. Under intermittent CT guidance, an 18 gauge trocar needle was advanced into the fluid and gas collection. A 0.035 wire was then coiled in the collection. The skin tract was dilated to twelve [place]. A Cook 12 [place] all-purpose drainage catheter was advanced over the wire and formed. Aspiration yields 140 mL of purulent foul-smelling fluid. Samples were sent for Gram stain and culture. The abscess cavity was then lavaged with saline and the drain secured to the skin with 0 Prolene suture. The drain was connected to JP bulb suction. IMPRESSION: Successful placement of 12 French abscess drain with evacuation of 140 mL purulent fluid. Samples were sent for Gram stain and culture. Electronically Signed   By: Jacqulynn Cadet M.D.   On: 08/24/2022  16:54       Everley Evora M.D. [facility] 08/26/2022, 1:15 PM  Available via Epic secure chat 7am-7pm After 7 pm, please refer to night coverage provider listed on amion.

## 2022-08-26 NOTE — Progress Notes (Addendum)
   08/26/22 1245  Clinical Encounter Type  Visited With Patient  Visit Type Initial;Spiritual support  Referral From Nurse  Consult/Referral To Chaplain   Chaplain responded to a spiritual consult. The patient, Jerry Knight asked to see a priest so that he may receive an anointing of the sick. I reached out to Father Barnabas Lister, he will be on location tomorrow 26 Sept 2023 to do this.    Advised patient of pending visit with Father Barnabas Lister.   Danice Goltz  [facility]  [phone]

## 2022-08-26 NOTE — Progress Notes (Signed)
Mepitel ordered for abdominal wound.

## 2022-08-26 NOTE — Progress Notes (Addendum)
10 Days Post-Op  Subjective: CC: Feeling much better today. Tolerating cld and finishing ~50% of his trays. No n/v. Some L sided abdominal pain that is overall improved from yesterday and well controlled with pca. Still no output from colostomy. He reports he just changed his ostomy bag and midline wound himself today. Mobilized some in the room yesterday. Voiding.   Objective: Vital signs in last 24 hours: Temp:  [98.1 F (36.7 C)-98.7 F (37.1 C)] 98.6 F (37 C) (09/25 0723) Pulse Rate:  [90-98] 94 (09/25 0723) Resp:  [11-18] 13 (09/25 0816) BP: (91-114)/(62-76) 107/65 (09/25 0723) SpO2:  [95 %-100 %] 96 % (09/25 0723) FiO2 (%):  [28 %] 28 % (09/25 0400)    Intake/Output from previous day: 09/24 0701 - 09/25 0700 In: 20  Out: 1730 [Urine:1700; Drains:30] Intake/Output this shift: No intake/output data recorded.  PE: General: pleasant, WD, male who is laying in bed in NAD Lungs: Respiratory effort nonlabored Abd: Soft, ND, some L sided ttp without rigidity or guarding, colostomy with scant sweat in bag - no gas or stool. Stoma pink and viable. Surgical JP drain with scant thick, purulent like output similar to yesterday - appears similar amount as yesterday and there was no output documented on I/O. IR drain with bloody purulent output. Midline wound separation as noted below.  There is some granulation tissue overlying.  Less fibrinous exudate compared to prior.  No obvious evisceration. He still does not have is mepitel unfortunately.      Lab Results:  Recent Labs    08/25/22 0325 08/26/22 0319  WBC 15.2* 15.3*  HGB 9.1* 9.5*  HCT 27.1* 30.1*  PLT 380 457*   BMET Recent Labs    08/25/22 0325 08/26/22 0319  NA 135 135  K 4.4 4.1  CL 102 102  CO2 26 24  GLUCOSE 195* 122*  BUN 9 10  CREATININE 0.54* 0.52*  CALCIUM 8.2* 8.6*   PT/INR Recent Labs    08/24/22 1415  LABPROT 14.5  INR 1.1   CMP     Component Value Date/Time   NA 135 08/26/2022 0319    NA 140 10/12/2020 1526   K 4.1 08/26/2022 0319   CL 102 08/26/2022 0319   CO2 24 08/26/2022 0319   GLUCOSE 122 (H) 08/26/2022 0319   BUN 10 08/26/2022 0319   BUN 10 10/12/2020 1526   CREATININE 0.52 (L) 08/26/2022 0319   CREATININE 0.77 01/27/2013 1105   CALCIUM 8.6 (L) 08/26/2022 0319   PROT 7.0 08/26/2022 0319   PROT 7.5 10/12/2020 1526   ALBUMIN 2.0 (L) 08/26/2022 0319   ALBUMIN 4.5 10/12/2020 1526   AST 18 08/26/2022 0319   ALT 13 08/26/2022 0319   ALKPHOS 82 08/26/2022 0319   BILITOT 0.5 08/26/2022 0319   BILITOT 0.3 10/12/2020 1526   GFRNONAA >60 08/26/2022 0319   GFRAA 123 10/12/2020 1526   Lipase     Component Value Date/Time   LIPASE 39 08/16/2022 0506    Studies/Results: CT GUIDED VISCERAL FLUID DRAIN BY PERC CATH  Result Date: 08/24/2022 INDICATION: 61 year old male with fluid and gas collection in left retroperitoneal space concerning for abscess. EXAM: CT GUIDED DRAINAGE OF retroperitoneal ABSCESS TECHNIQUE: Multidetector CT imaging of the abdomen was performed following the standard protocol without IV contrast. RADIATION DOSE REDUCTION: This exam was performed according to the departmental dose-optimization program which includes automated exposure control, adjustment of the mA and/or kV according to patient size and/or use of iterative reconstruction technique.  MEDICATIONS: The patient is currently admitted to the hospital and receiving intravenous antibiotics. The antibiotics were administered within an appropriate time frame prior to the initiation of the procedure. ANESTHESIA/SEDATION: Moderate (conscious) sedation was employed during this procedure. A total of Versed mg and Fentanyl mcg was administered intravenously by the radiology nurse. Total intra-service moderate Sedation Time: minutes. The patient's level of consciousness and vital signs were monitored continuously by radiology nursing throughout the procedure under my direct supervision. COMPLICATIONS:  None immediate. TECHNIQUE: Informed written consent was obtained from the patient after a thorough discussion of the procedural risks, benefits and alternatives. All questions were addressed. Maximal Sterile Barrier Technique was utilized including caps, mask, sterile gowns, sterile gloves, sterile drape, hand hygiene and skin antiseptic. A timeout was performed prior to the initiation of the procedure. PROCEDURE: The operative field was prepped with Chlorhexidine in a sterile fashion, and a sterile drape was applied covering the operative field. A sterile gown and sterile gloves were used for the procedure. Local anesthesia was provided with 1% Lidocaine. FINDINGS: A planning axial CT scan was performed. Fluid and gas collection in the left retroperitoneal space posterior to the psoas muscle was successfully identified. A suitable skin entry site was selected and marked. The skin was sterilely prepped and draped in the standard fashion with chlorhexidine skin prep. Local anesthesia was attained by infiltration with 1% lidocaine. A small dermatotomy was made. Under intermittent CT guidance, an 18 gauge trocar needle was advanced into the fluid and gas collection. A 0.035 wire was then coiled in the collection. The skin tract was dilated to twelve [place]. A Cook 12 [place] all-purpose drainage catheter was advanced over the wire and formed. Aspiration yields 140 mL of purulent foul-smelling fluid. Samples were sent for Gram stain and culture. The abscess cavity was then lavaged with saline and the drain secured to the skin with 0 Prolene suture. The drain was connected to JP bulb suction. IMPRESSION: Successful placement of 12 French abscess drain with evacuation of 140 mL purulent fluid. Samples were sent for Gram stain and culture. Electronically Signed   By: Jacqulynn Cadet M.D.   On: 08/24/2022 16:54    Anti-infectives: Anti-infectives (From admission, onward)    Start     Dose/Rate Route Frequency Ordered  Stop   08/17/22 1630  piperacillin-tazobactam (ZOSYN) IVPB 3.375 g        3.375 g 12.5 mL/hr over 240 Minutes Intravenous Every 8 hours 08/17/22 1540     08/16/22 1500  piperacillin-tazobactam (ZOSYN) IVPB 3.375 g        3.375 g 100 mL/hr over 30 Minutes Intravenous STAT 08/16/22 1446 08/16/22 2032   08/16/22 0615  piperacillin-tazobactam (ZOSYN) IVPB 3.375 g        3.375 g 100 mL/hr over 30 Minutes Intravenous  Once 08/16/22 0609 08/16/22 0713        Assessment/Plan POD 10 ex lap partial sigmoid colectomy, colostomy by Dr. Oda Cogan on 9/15 for Perforated stercoral ulcer - History of chronic pain on suboxone. Continue PCA.  - Cont TPN - started 9/19 pm. Ok FLD. AROBF - CT w/ extraperitoneal fluid collection in LLQ lateral to psoas. S/p IR drain 9/23. Cx with E. Coli, sensitives pending. Cont abx - Cont drain, monitor - Mepitel at base of midline with dry to dry dressings over bid - unfortunately he still does not have this despite multiple messages and orders. I have placed orders again and reached out to staff to see if we can obtain this. -  WOC RN following for new ostomy - Encourage ambulation and IS use   FEN - FLD, IVFs, TPN VTE - SCDs, lovenox ID - zosyn 9/14> Foley - out 9/18. voiding   Per primary Tobacco abuse/COPD - 2 ppd H/O substance abuse H/O opioid dependence - now on suboxone prn H/O seizure d/o Chronic pancreatitis Zollinger-Ellison syndrome GERD Hypoalbuminemic - appears malnourished at baseline due to muscle wasting RA - mtx and enbrel held   LOS: 10 days    Jillyn Ledger , [facility] 08/26/2022, 9:01 AM Please see Amion for pager number during day hours 7:00am-4:30pm

## 2022-08-26 NOTE — Progress Notes (Signed)
Physical Therapy Treatment Patient Details Name: Jerry Knight MRN: [id number] DOB: [birth year] Today's Date: 08/26/2022   History of Present Illness Pt is a 61 y/o male admitted secondary to bowel perforation. Pt is s/p emergent exploratory laparotomy, left colostomy and partial sigmoid colectomy on 9/15. PMH includes RA, COPD, and seizures.    PT Comments    Pt making good progress.  Ambulated in hallway 180' and min guard.  Did have mild gait instability - recommend use of cane or RW initially.  Continue to progress as able.  Increased time for tx for line management and RN in to change dressing during session.     Recommendations for follow up therapy are one component of a multi-disciplinary discharge planning process, led by the attending physician.  Recommendations may be updated based on patient status, additional functional criteria and insurance authorization.  Follow Up Recommendations  No PT follow up     Assistance Recommended at Discharge PRN  Patient can return home with the following A little help with bathing/dressing/bathroom;Assistance with cooking/housework;Assist for transportation;Help with stairs or ramp for entrance   Equipment Recommendations  None recommended by PT    Recommendations for Other Services       Precautions / Restrictions Precautions Precautions: Fall     Mobility  Bed Mobility Overal bed mobility: Needs Assistance Bed Mobility: Supine to Sit     Supine to sit: Supervision, HOB elevated          Transfers Overall transfer level: Needs assistance Equipment used: None Transfers: Sit to/from Stand Sit to Stand: Supervision           General transfer comment: supervision for safety    Ambulation/Gait Ambulation/Gait assistance: Min guard Gait Distance (Feet): 180 Feet Assistive device: None Gait Pattern/deviations: Drifts right/left Gait velocity: decreased     General Gait Details: Pt drifted to the R 3-4 times  requiring min guard to recover. Recommended use of cane at [employer] Rankin (Stroke Patients Only)       Balance Overall balance assessment: Needs assistance Sitting-balance support: No upper extremity supported, Feet supported Sitting balance-Leahy Scale: Good     Standing balance support: No upper extremity supported, During functional activity Standing balance-Leahy Scale: Fair                              Cognition Arousal/Alertness: Awake/alert Behavior During Therapy: WFL for tasks assessed/performed Overall Cognitive Status: Within Functional Limits for tasks assessed                                          Exercises      General Comments        Pertinent Vitals/Pain Pain Assessment Pain Assessment: 0-10 Pain Score: 7  Pain Location: abdomen Pain Descriptors / Indicators: Discomfort Pain Intervention(s): Limited activity within patient's tolerance, Monitored during session    Home Living                          Prior Function            PT Goals (current goals can now be found in the care plan section) Progress towards PT goals: Progressing toward goals  Frequency    Min 3X/week      PT Plan Current plan remains appropriate    Co-evaluation              AM-PAC PT "6 Clicks" Mobility   Outcome Measure  Help needed turning from your back to your side while in a flat bed without using bedrails?: A Little Help needed moving from lying on your back to sitting on the side of a flat bed without using bedrails?: A Little Help needed moving to and from a bed to a chair (including a wheelchair)?: A Little Help needed standing up from a chair using your arms (e.g., wheelchair or bedside chair)?: A Little Help needed to walk in hospital room?: A Little Help needed climbing 3-5 steps with a railing? : A Little 6 Click Score: 18    End of  Session Equipment Utilized During Treatment: Gait belt Activity Tolerance: Patient tolerated treatment well Patient left: with call bell/phone within reach;in bed;with nursing/sitter in room Nurse Communication: Mobility status PT Visit Diagnosis: Other abnormalities of gait and mobility (R26.89)     Time: 1443-1510 PT Time Calculation (min) (ACUTE ONLY): 27 min  Charges:  $Gait Training: 8-22 mins $Therapeutic Activity: 8-22 mins                     Abran Richard, PT Acute Rehab [facility] [phone]    Karlton Lemon 08/26/2022, 3:32 PM

## 2022-08-27 DIAGNOSIS — M542 Cervicalgia: Secondary | ICD-10-CM | POA: Diagnosis not present

## 2022-08-27 DIAGNOSIS — J410 Simple chronic bronchitis: Secondary | ICD-10-CM | POA: Diagnosis not present

## 2022-08-27 DIAGNOSIS — F419 Anxiety disorder, unspecified: Secondary | ICD-10-CM | POA: Diagnosis not present

## 2022-08-27 DIAGNOSIS — K631 Perforation of intestine (nontraumatic): Secondary | ICD-10-CM | POA: Diagnosis not present

## 2022-08-27 LAB — GLUCOSE, CAPILLARY
Glucose-Capillary: 110 mg/dL — ABNORMAL HIGH (ref 70–99)
Glucose-Capillary: 120 mg/dL — ABNORMAL HIGH (ref 70–99)
Glucose-Capillary: 135 mg/dL — ABNORMAL HIGH (ref 70–99)
Glucose-Capillary: 99 mg/dL (ref 70–99)

## 2022-08-27 MED ORDER — OXYCODONE HCL 5 MG PO TABS
10.0000 mg | ORAL_TABLET | ORAL | Status: DC | PRN
  Administered 2022-08-27 – 2022-08-30 (×10): 15 mg via ORAL
  Filled 2022-08-27 (×15): qty 3

## 2022-08-27 MED ORDER — MORPHINE SULFATE (PF) 2 MG/ML IV SOLN
2.0000 mg | INTRAVENOUS | Status: DC | PRN
  Administered 2022-08-27 – 2022-08-28 (×6): 2 mg via INTRAVENOUS
  Administered 2022-08-28 (×2): 4 mg via INTRAVENOUS
  Administered 2022-08-28 – 2022-08-29 (×6): 2 mg via INTRAVENOUS
  Filled 2022-08-27: qty 2
  Filled 2022-08-27 (×6): qty 1
  Filled 2022-08-27: qty 2
  Filled 2022-08-27 (×6): qty 1
  Filled 2022-08-27: qty 2

## 2022-08-27 MED ORDER — TRAVASOL 10 % IV SOLN
INTRAVENOUS | Status: AC
  Filled 2022-08-27: qty 873.6

## 2022-08-27 MED ORDER — SODIUM CHLORIDE 0.9 % IV SOLN
3.0000 g | Freq: Three times a day (TID) | INTRAVENOUS | Status: AC
  Administered 2022-08-27 – 2022-08-29 (×6): 3 g via INTRAVENOUS
  Filled 2022-08-27 (×6): qty 8

## 2022-08-27 MED ORDER — ENSURE ENLIVE PO LIQD
237.0000 mL | Freq: Three times a day (TID) | ORAL | Status: DC
  Administered 2022-08-27 – 2022-09-02 (×6): 237 mL via ORAL

## 2022-08-27 NOTE — Progress Notes (Signed)
Nutrition Follow-up  DOCUMENTATION CODES:  Severe malnutrition in context of chronic illness  INTERVENTION:  TPN management per pharmacy Continue current diet as ordered, advance as able Encourage PO intake Ensure Enlive po TID, each supplement provides 350 kcal and 20 grams of protein.  NUTRITION DIAGNOSIS:  Severe Malnutrition related to chronic illness as evidenced by severe muscle depletion, severe fat depletion. -remains applicable  GOAL:  Patient will meet greater than or equal to 90% of their needs - being addressed with TPN  MONITOR:  Diet advancement, Labs, I & O's, Weight trends  REASON FOR ASSESSMENT:  Consult New TPN/TNA  ASSESSMENT:  61 y.o. male presented to the ED with abdominal pain. PMH includes COPD, GERD, chronic pancreatitis, polysubstance abuse, and tobacco abuse. Pt admitted with sepsis 2/2 bowel perforation requiring emergent ex-lap.   9/15 - Ex-lap s/p partial sigmoid colectomy, colostomy 9/18 - NGT removed 9/19 - TPN started   9/23 - IR placed drain for left retroperitoneal abscess  Pt resting in bed, states he is not having a good day with eating as his pain regimen has been adjusted and is not under good control. Has only tolerated gingerale and ice today. Agreeable to ensure once his pain is under better control. Prefers strawberry.  TPN currently providing 100% of needs.  Nutritionally Relevant Medications: Scheduled Meds:  pantoprazole (PROTONIX) IV  40 mg Intravenous Q12H   Continuous Infusions:  piperacillin-tazobactam (ZOSYN)  IV 3.375 g (08/27/22 0456)   TPN ADULT (ION) 70 mL/hr at 08/26/22 1822   TPN ADULT (ION)     Labs Reviewed  NUTRITION - FOCUSED PHYSICAL EXAM:  Flowsheet Row Most Recent Value  Orbital Region Severe depletion  Upper Arm Region Severe depletion  Thoracic and Lumbar Region Severe depletion  Buccal Region Severe depletion  Temple Region Moderate depletion  Clavicle Bone Region Severe depletion  Clavicle and  Acromion Bone Region Severe depletion  Scapular Bone Region Severe depletion  Dorsal Hand Mild depletion  Patellar Region Unable to assess  Anterior Thigh Region Unable to assess  Posterior Calf Region Unable to assess  Edema (RD Assessment) None  Hair Reviewed  Eyes Reviewed  Mouth Reviewed  Skin Reviewed  Nails Reviewed   Diet Order:   Diet Order             Diet full liquid Room service appropriate? Yes; Fluid consistency: Thin  Diet effective now                   EDUCATION NEEDS:   No education needs have been identified at this time  Skin:  Skin Assessment: Reviewed RN Assessment  Last BM:  PTA  Height:   Ht Readings from Last 1 Encounters:  08/16/22 5' 5.5" (1.664 m)    Weight:   Wt Readings from Last 1 Encounters:  08/16/22 52.6 kg    Ideal Body Weight:  64.6 kg  BMI:  Body mass index is 19.01 kg/m.  Estimated Nutritional Needs:  Kcal:  1700-1900 Protein:  85-100 grams Fluid:  >/= 1.7 L   Ranell Patrick, RD, LDN Clinical Dietitian RD pager # available in AMION  After hours/weekend pager # available in [facility]

## 2022-08-27 NOTE — Progress Notes (Signed)
[facility]                                                                              Jerry Knight, is a 61 y.o. male, DOB - 1961-07-06, IOM:[id number] Admit date - 08/16/2022    Outpatient Primary MD for the patient is Jerry Knight, [place]  LOS - 11  days  Chief Complaint  Patient presents with   Abdominal Pain       Brief summary   Jerry Knight is a 61 y.o. male with medical history significant of  COPD, chronic pancreatitis, rheumatoid arthritis on Enbrel, history of seizures not on an AED, chronic pain on Suboxone, polysubstance abuse, Zollinger-Ellison syndrome, and constipation who presented with complaints of  lower abdominal pain.  Imaging remarkable for colonic perforation/obstruction  Patient was admitted for further work-up  Assessment & Plan    Principal Problem: Sepsis secondary to bowel perforation ([place]) due to stercoral ulcer, left  retroperitoneal abscess -CT revealed colonic perforation in the region of the sigmoid colon thought secondary to impacted stool.  -Met sepsis criteria on admission with tachycardia, tachypnea, leukocytosis, lactic acidosis -Surgery consulted, underwent emergent exploratory laparotomy, left colostomy and partial sigmoid colectomy on 9/15 -Repeat CT on 9/22 with new 7 cm fluid collection in the LLQ s/p drain placement by IR on 9/23, cultures showing abundant E. Coli -Continue full liquid diet, TPN, antibiotics transitioned to IV Unasyn -Diet advanced to full liquids, continue TPN.   -Still little to no output from ostomy, surgery following closely, PCA off, decrease narcotics  Normocytic anemia -Likely anemia of chronic disease -Hemoglobin stable 9.5  Hypokalemia, hyponatremia -Continue TPN  COPD, without exacerbation - Chest x-ray noted chronic interstitial lung changes with bibasilar atelectasis. - Albuterol nebs as needed   History of rheumatoid arthritis -Outpatient on Enbrel 50 mg and  methotrexate 10 mg on Saturdays -Currently regimen on hold  Insomnia, anxiety -For now continue Ativan as needed for anxiety -Continue trazodone 150 mg at bedtime    GERD -Continue PPI   Chronic pain  -Outpatient on Suboxone -Off the PCA, placed on oral oxycodone as needed, continue morphine as needed for breakthrough pain - continue tizanidine, continue Lidoderm patch as needed   Tobacco abuse Patient reportedly smokes 2 packs of cigarettes per day on average. - cont Nicotine patch  Moderate to severe protein calorie malnutrition due to acute illness, hypoalbuminemia 1.9, muscle wasting Estimated body mass index is 19.01 kg/m as calculated from the following:   Height as of this encounter: 5' 5.5" (1.664 m).   Weight as of this encounter: 52.6 kg. -Currently on TPN  Code Status: Full code DVT Prophylaxis:  enoxaparin (LOVENOX) injection 40 mg Start: 08/25/22 1000 SCD's Start: 08/16/22 1754 SCDs Start: 08/16/22 1600   Level of Care: Level of care: Telemetry Surgical Family Communication: updated patient's wife at the bedside on 9/25  Disposition Plan:      Remains inpatient appropriate: Not stable for discharge   Procedures:  9/15 : ex lap partial sigmoid colectomy, colostomy  9/23: Drain into left retroperitoneal abscess with 140 mL purulent fluid aspirated  Consultants:   General surgery Interventional  radiology  Antimicrobials:   Anti-infectives (From admission, onward)    Start     Dose/Rate Route Frequency Ordered Stop   08/27/22 1300  Ampicillin-Sulbactam (UNASYN) 3 g in sodium chloride 0.9 % 100 mL IVPB        3 g 200 mL/hr over 30 Minutes Intravenous Every 8 hours 08/27/22 1202 08/29/22 1259   08/17/22 1630  piperacillin-tazobactam (ZOSYN) IVPB 3.375 g  Status:  Discontinued        3.375 g 12.5 mL/hr over 240 Minutes Intravenous Every 8 hours 08/17/22 1540 08/27/22 1202   08/16/22 1500  piperacillin-tazobactam (ZOSYN) IVPB 3.375 g        3.375 g 100  mL/hr over 30 Minutes Intravenous STAT 08/16/22 1446 08/16/22 2032   08/16/22 0615  piperacillin-tazobactam (ZOSYN) IVPB 3.375 g        3.375 g 100 mL/hr over 30 Minutes Intravenous  Once 08/16/22 0609 08/16/22 0713          Medications  acetaminophen  1,000 mg Oral Q6H   Chlorhexidine Gluconate Cloth  6 each Topical Daily   enoxaparin (LOVENOX) injection  40 mg Subcutaneous Q24H   feeding supplement  237 mL Oral TID BM   lidocaine  1 patch Transdermal Q24H   nicotine  21 mg Transdermal QHS   pantoprazole (PROTONIX) IV  40 mg Intravenous Q12H   sodium chloride flush  3 mL Intravenous Q12H   sodium chloride flush  5 mL Intracatheter Q8H   tiZANidine  2 mg Oral Q8H   traZODone  150 mg Oral QHS      Subjective:   Jerry Knight was seen and examined today.  Somewhat frustrated over PCA off, worried that his pain will not be well controlled.  Tolerating full liquid diet.  No fevers, still little ostomy output.   Objective:   Vitals:   08/27/22 0628 08/27/22 0757 08/27/22 0806 08/27/22 0955  BP:  110/70    Pulse:  (!) 102    Resp: 17 16 12 16   Temp:  98.6 F (37 C)    TempSrc:  Oral    SpO2: 98% 98% 100% 97%  Weight:      Height:        Intake/Output Summary (Last 24 hours) at 08/27/2022 1441 Last data filed at 08/27/2022 0800 Gross per 24 hour  Intake 1120.9 ml  Output 1525 ml  Net -404.1 ml     Wt Readings from Last 3 Encounters:  08/16/22 52.6 kg  07/27/22 53.5 kg  11/09/21 52.2 kg   Physical Exam General: Alert and oriented x 3, NAD Cardiovascular: S1 S2 clear, RRR.  Respiratory: CTAB, no wheezing Gastrointestinal: Midline dressing intact, drain+, stoma pink and viable, scant output in the colostomy bag Ext: no pedal edema bilaterally Neuro: no new deficits Psych: somewhat anxious, flat affect  Data Reviewed:  I have personally reviewed following labs    CBC Lab Results  Component Value Date   WBC 15.3 (H) 08/26/2022   RBC 3.16 (L) 08/26/2022    HGB 9.5 (L) 08/26/2022   HCT 30.1 (L) 08/26/2022   MCV 95.3 08/26/2022   MCH 30.1 08/26/2022   PLT 457 (H) 08/26/2022   MCHC 31.6 08/26/2022   RDW 14.5 08/26/2022   LYMPHSABS 1.1 08/16/2022   MONOABS 1.4 (H) 08/16/2022   EOSABS 0.0 08/16/2022   BASOSABS 0.0 70/78/6754     Last metabolic panel Lab Results  Component Value Date   NA 135 08/26/2022   K 4.1 08/26/2022  CL 102 08/26/2022   CO2 24 08/26/2022   BUN 10 08/26/2022   CREATININE 0.52 (L) 08/26/2022   GLUCOSE 122 (H) 08/26/2022   GFRNONAA >60 08/26/2022   GFRAA 123 10/12/2020   CALCIUM 8.6 (L) 08/26/2022   PHOS 3.8 08/26/2022   PROT 7.0 08/26/2022   ALBUMIN 2.0 (L) 08/26/2022   LABGLOB 3.0 10/12/2020   AGRATIO 1.5 10/12/2020   BILITOT 0.5 08/26/2022   ALKPHOS 82 08/26/2022   AST 18 08/26/2022   ALT 13 08/26/2022   ANIONGAP 9 08/26/2022    CBG (last 3)  Recent Labs    08/27/22 0032 08/27/22 0547 08/27/22 1144  GLUCAP 110* 135* 99      Coagulation Profile: Recent Labs  Lab 08/24/22 1415  INR 1.1     Radiology Studies: I have personally reviewed the imaging studies  No results found.     Estill Cotta M.D. [facility] 08/27/2022, 2:41 PM  Available via Epic secure chat 7am-7pm After 7 pm, please refer to night coverage provider listed on amion.

## 2022-08-27 NOTE — Progress Notes (Signed)
PHARMACY - TOTAL PARENTERAL NUTRITION CONSULT NOTE   Indication: Prolonged ileus  Patient Measurements: Height: 5' 5.5" (166.4 cm) Weight: 52.6 kg (116 lb) IBW/kg (Calculated) : 62.65 TPN AdjBW (KG): 52.6 Body mass index is 19.01 kg/m.  Usual Weight: 115 lbs  Assessment:  61 yo male with a history of chronic pain syndrome on Suboxone, chronic pancreatitis with prior surgery for pseudocyst removal in the 1990s, prior BII in the 1980s, COPD, rheumatoid arthritis on Enbrel, history of seizures not on an AED, and Zollinger-Ellison syndrome who presented with acute onset of abdominal pain along with N/V. He was found to have a WBC of 22K, lactic acid of 3.1 and a CT scan concerning for a colonic perforation. Pharmacy consulted to initiate TPN for prolonged ileus.  Glucose / Insulin: no hx DM, CBGs controlled   Electrolytes: Na 135, K 4.1, Cl 102, CoCa 9.9. Other electrolytes wnl.  Renal: Scr 0.54 (bsl < 1); BUN wnl Hepatic: LFTs wnl. Albumin 2. TG 132  Intake / Output; MIVF: UOP 1.2 ml/kg/hr; JP drain 90 ml. 0 ml stool.   GI Imaging: 9/15 colonic perforation in the region of the sigmoid colon likely secondary to impacted stool GI Surgeries / Procedures:  9/15 Exploratory laparotomy, Left colostomy and partial sigmoid colectomy  9/23: drain placed for L retroperitoneal abscess (142m fluid aspirated)  Central access: 08/20/22 TPN start date:  08/20/22  Nutritional Goals: Goal TPN rate is 70 mL/hr (provides 87 g of protein and 1703 kcals per day)  RD Assessment: Estimated Needs Total Energy Estimated Needs: 1700-1900 Total Protein Estimated Needs: 85-100 grams Total Fluid Estimated Needs: >/= 1.7 L   Current Nutrition:  9/19 NPO 9/24 CLD 9/25 FLD  Plan:  Continue TPN at goal rate of 774mhr to provide 87g AA and 1703 kcal Electrolytes in TPN:  Continue Na 150 mEq/L,  K to 45 mEq/L, Ca 5 mEq/L, Mg 10 mEq/L, phos 15 mmol/L. Continue Cl:Ac 1:1.  Add standard MVI and trace  elements to TPN Monitor CBGs - continue off SSI since CBGs controlled Monitor TPN labs on Mon/Thurs, as needed Follow up toleration and advancement of diet -  FLD  Thank you for allowing pharmacy to be a part of this patient's care.  CaAlanda SlimPharmD, FCDenver Surgicenter LLClinical Pharmacist Please see AMION for all Pharmacists' Contact Phone Numbers 08/27/2022, 7:41 AM

## 2022-08-27 NOTE — Progress Notes (Signed)
Mobility Specialist - Progress Note   08/27/22 1657  Mobility  Activity Ambulated with assistance in hallway  Level of Assistance Modified independent, requires aide device or extra time  Assistive Device Other (Comment) (IV Pole)  Distance Ambulated (ft) 550 ft  Activity Response Tolerated well  $Mobility charge 1 Mobility    Pt received in bed agreeable to mobility. Left in bed w/ call bell in reach and all needs met.   Paulla Dolly Mobility Specialist

## 2022-08-27 NOTE — Progress Notes (Signed)
Referring Physician(s): Dr Ayesha Rumpf  Supervising Physician: Mir, Sharen Heck  Patient Status:  [facility] - In-pt  Chief Complaint:  POD 10 ex lap partial sigmoid colectomy, colostomy by Dr. Oda Cogan on 9/15 for Perforated stercoral ulcer  Subjective:  Up in bed Talking on phone Tired Denies N/V Denies pain OP of IR LUQ drain - purulent +Ecoli  Allergies: Ambien [zolpidem tartrate], Iohexol, Lunesta [eszopiclone], Seroquel [quetiapine fumarate], and Sonata [zaleplon]  Medications: Prior to Admission medications   Medication Sig Start Date End Date Taking? Authorizing Provider  amitriptyline (ELAVIL) 50 MG tablet Take 100 mg by mouth at bedtime.   Yes [provider]  atorvastatin (LIPITOR) 20 MG tablet Take 1 tablet (20 mg total) by mouth daily. 02/19/21  Yes Just, Laurita Quint, FNP  buprenorphine-naloxone (SUBOXONE) 8-2 mg SUBL SL tablet Place 1 tablet under the tongue daily.   Yes [provider]  cyclobenzaprine (FLEXERIL) 10 MG tablet Take 1 tablet (10 mg total) by mouth 3 (three) times daily. 02/19/21  Yes Just, Laurita Quint, FNP  etanercept (ENBREL) 50 MG/ML injection Inject 50 mg into the skin once a week.   Yes [provider]  folic acid (FOLVITE) 1 MG tablet Take 1 mg by mouth daily. 08/06/22  Yes [provider]  linaclotide Rolan Lipa) 145 MCG CAPS capsule Take 1 capsule (145 mcg total) by mouth daily before breakfast. 02/19/21  Yes Just, Laurita Quint, FNP  LORazepam (ATIVAN) 1 MG tablet Take 1 mg by mouth 3 (three) times daily. 07/18/22  Yes [provider]  methotrexate (RHEUMATREX) 2.5 MG tablet Take 10 mg by mouth once a week. 06/08/22  Yes [provider]  mirtazapine (REMERON) 45 MG tablet Take 1 tablet (45 mg total) by mouth at bedtime. 02/19/21  Yes Just, Laurita Quint, FNP  omeprazole (PRILOSEC) 20 MG capsule TAKE 1 CAPSULE(20 MG) BY MOUTH DAILY 02/19/21  Yes Just, Laurita Quint, FNP  traZODone (DESYREL) 50 MG tablet Take 3-4 tablets (150-200 mg  total) by mouth at bedtime. 02/19/21  Yes Just, Laurita Quint, FNP     Vital Signs: BP 110/70 (BP Location: Left Arm)   Pulse (!) 102   Temp 98.6 F (37 C) (Oral)   Resp (!) 7   Ht 5' 5.5" (1.664 m)   Wt 116 lb (52.6 kg)   SpO2 100%   BMI 19.01 kg/m   Physical Exam Skin:    Comments: Site is clean and dry NT no bleeding Flushes easily Aspirates easily OP is purulent 90 cc yesterday Culture ABUNDANT ESCHERICHIA COLI  Report Status PENDING  Organism ID, Bacteria ESCHERICHIA COLI       Imaging: CT GUIDED VISCERAL FLUID DRAIN BY PERC CATH  Result Date: 08/24/2022 INDICATION: 61 year old male with fluid and gas collection in left retroperitoneal space concerning for abscess. EXAM: CT GUIDED DRAINAGE OF retroperitoneal ABSCESS TECHNIQUE: Multidetector CT imaging of the abdomen was performed following the standard protocol without IV contrast. RADIATION DOSE REDUCTION: This exam was performed according to the departmental dose-optimization program which includes automated exposure control, adjustment of the mA and/or kV according to patient size and/or use of iterative reconstruction technique. MEDICATIONS: The patient is currently admitted to the hospital and receiving intravenous antibiotics. The antibiotics were administered within an appropriate time frame prior to the initiation of the procedure. ANESTHESIA/SEDATION: Moderate (conscious) sedation was employed during this procedure. A total of Versed mg and Fentanyl mcg was administered intravenously by the radiology nurse. Total intra-service moderate Sedation Time: minutes. The patient's level  of consciousness and vital signs were monitored continuously by radiology nursing throughout the procedure under my direct supervision. COMPLICATIONS: None immediate. TECHNIQUE: Informed written consent was obtained from the patient after a thorough discussion of the procedural risks, benefits and alternatives. All questions were addressed. Maximal  Sterile Barrier Technique was utilized including caps, mask, sterile gowns, sterile gloves, sterile drape, hand hygiene and skin antiseptic. A timeout was performed prior to the initiation of the procedure. PROCEDURE: The operative field was prepped with Chlorhexidine in a sterile fashion, and a sterile drape was applied covering the operative field. A sterile gown and sterile gloves were used for the procedure. Local anesthesia was provided with 1% Lidocaine. FINDINGS: A planning axial CT scan was performed. Fluid and gas collection in the left retroperitoneal space posterior to the psoas muscle was successfully identified. A suitable skin entry site was selected and marked. The skin was sterilely prepped and draped in the standard fashion with chlorhexidine skin prep. Local anesthesia was attained by infiltration with 1% lidocaine. A small dermatotomy was made. Under intermittent CT guidance, an 18 gauge trocar needle was advanced into the fluid and gas collection. A 0.035 wire was then coiled in the collection. The skin tract was dilated to twelve [place]. A Cook 12 [place] all-purpose drainage catheter was advanced over the wire and formed. Aspiration yields 140 mL of purulent foul-smelling fluid. Samples were sent for Gram stain and culture. The abscess cavity was then lavaged with saline and the drain secured to the skin with 0 Prolene suture. The drain was connected to JP bulb suction. IMPRESSION: Successful placement of 12 French abscess drain with evacuation of 140 mL purulent fluid. Samples were sent for Gram stain and culture. Electronically Signed   By: Jacqulynn Cadet M.D.   On: 08/24/2022 16:54   CT ABDOMEN PELVIS WO CONTRAST  Result Date: 08/23/2022 CLINICAL DATA:  Stercoral colitis with ulcer and perforation. Postop from sigmoid colectomy. EXAM: CT ABDOMEN AND PELVIS WITHOUT CONTRAST TECHNIQUE: Multidetector CT imaging of the abdomen and pelvis was performed following the standard protocol  without IV contrast. RADIATION DOSE REDUCTION: This exam was performed according to the departmental dose-optimization program which includes automated exposure control, adjustment of the mA and/or kV according to patient size and/or use of iterative reconstruction technique. COMPARISON:  08/16/2022 FINDINGS: Lower chest: Increased infiltrate or atelectasis in left lower lobe and tiny left pleural effusion. Small pericardial effusion has also increased in size. Hepatobiliary: No mass visualized on this unenhanced exam. Gallbladder is unremarkable. No evidence of biliary ductal dilatation. Pancreas: No mass or inflammatory process visualized on this unenhanced exam. Spleen:  Within normal limits in size. Adrenals/Urinary tract: Mild right renal parenchymal scarring noted. 3 mm left renal calculus is seen. Mild to moderate left hydronephrosis has increased since previous study, and appears to be due to retroperitoneal inflammatory change along the medial aspect of the left psoas. No evidence of ureteral calculi or dilatation. Small bowel gas is noted in the urinary bladder, likely due to recent catheterization. Stomach/Bowel: Postop changes are seen from sigmoid colectomy with left lower quadrant colostomy since prior study. Catheter is seen within the pelvis. No evidence of bowel obstruction. An extraperitoneal fluid and gas collection is seen in the left lower quadrant lateral to the psoas muscle which measures 7.0 x 6.2 cm. No other abnormal fluid collections are identified. Vascular/Lymphatic: No pathologically enlarged lymph nodes identified. No evidence of abdominal aortic aneurysm. Aortic atherosclerotic calcification incidentally noted. Reproductive:  No mass or other significant  abnormality. Other:  None. Musculoskeletal:  No suspicious bone lesions identified. IMPRESSION: Postop changes from sigmoid colectomy with left lower quadrant colostomy. 7 cm extraperitoneal collection in left lower quadrant lateral  to the psoas muscle, suspicious for abscess. Increased mild to moderate left hydronephrosis, due to inflammatory change in the left retroperitoneum. No evidence of ureteral calculi or dilatation. Tiny nonobstructing left renal calculus. Left lower lobe atelectasis or infiltrate and tiny left pleural effusion. Small pericardial effusion also present. Electronically Signed   By: Marlaine Hind M.D.   On: 08/23/2022 21:15    Labs:  CBC: Recent Labs    08/22/22 0418 08/24/22 0356 08/25/22 0325 08/26/22 0319  WBC 15.9* 21.1* 15.2* 15.3*  HGB 10.2* 9.0* 9.1* 9.5*  HCT 29.6* 27.2* 27.1* 30.1*  PLT 217 328 380 457*    COAGS: Recent Labs    08/24/22 1415  INR 1.1    BMP: Recent Labs    08/23/22 0317 08/24/22 0356 08/25/22 0325 08/26/22 0319  NA 132* 131* 135 135  K 4.0 4.0 4.4 4.1  CL 99 100 102 102  CO2 '25 26 26 24  '$ GLUCOSE 143* 120* 195* 122*  BUN '10 9 9 10  '$ CALCIUM 8.1* 8.1* 8.2* 8.6*  CREATININE 0.51* 0.56* 0.54* 0.52*  GFRNONAA >60 >60 >60 >60    LIVER FUNCTION TESTS: Recent Labs    08/21/22 0307 08/22/22 0418 08/25/22 0325 08/26/22 0319  BILITOT 0.7 0.6 0.4 0.5  AST 14* '15 16 18  '$ ALT '10 10 12 13  '$ ALKPHOS 42 47 74 82  PROT 5.4* 5.6* 6.3* 7.0  ALBUMIN 1.9* 1.9* 1.8* 2.0*    Drain Location: LUQ Size: Fr size: 12 Fr Date of placement: 08/24/22  Currently to: Drain collection device: suction bulb 24 hour output:  Output by Drain (mL) 08/25/22 0701 - 08/25/22 1900 08/25/22 1901 - 08/26/22 0700 08/26/22 0701 - 08/26/22 1900 08/26/22 1901 - 08/27/22 0700 08/27/22 0701 - 08/27/22 0923  Closed System Drain 1 Right Abdomen Bulb (JP) 19 Fr. 0  0 0   Closed System Drain 3 Lateral;Left LLQ Bulb (JP) 12 Fr. 30  50 40     Interval imaging/drain manipulation:  none  Current examination: Flushes/aspirates easily.  Insertion site unremarkable. Suture and stat lock in place. Dressed appropriately.  90 cc purulent OP yesterday; 20 cc in JP  Plan: Continue TID  flushes with 5 cc NS. Record output Q shift. Dressing changes QD or PRN if soiled.  Call IR APP or on call IR MD if difficulty flushing or sudden change in drain output.  Repeat imaging/possible drain injection once output < 10 mL/QD (excluding flush material). Consideration for drain removal if output is < 10 mL/QD (excluding flush material), pending discussion with the providing surgical service.  Discharge planning: Please contact IR APP or on call IR MD prior to patient d/c to ensure appropriate follow up plans are in place. Typically patient will follow up with IR clinic 10-14 days post d/c for repeat imaging/possible drain injection. IR scheduler will contact patient with date/time of appointment. Patient will need to flush drain QD with 5 cc NS, record output QD, dressing changes every 2-3 days or earlier if soiled.   IR will continue to follow - please call with questions or concerns.   Assessment and Plan:  LUQ retroperitoneal abscess drain intact Flushes/aspirates easily  Electronically Signed: Lavonia Drafts, PA-C 08/27/2022, 9:23 AM   I spent a total of 15 Minutes at the the patient's bedside AND on  the patient's hospital floor or unit, greater than 50% of which was counseling/coordinating care for LUQ/ retroperitoneal abscess drain

## 2022-08-27 NOTE — Plan of Care (Signed)
  Problem: Education: Goal: Knowledge of General Education information will improve Description: Including pain rating scale, medication(s)/side effects and non-pharmacologic comfort measures Outcome: Progressing   Problem: Clinical Measurements: Goal: Diagnostic test results will improve Outcome: Progressing Goal: Respiratory complications will improve Outcome: Progressing   Problem: Elimination: Goal: Will not experience complications related to bowel motility Outcome: Progressing Goal: Will not experience complications related to urinary retention Outcome: Progressing

## 2022-08-27 NOTE — TOC Progression Note (Addendum)
Transition of Care [facility]) - Progression Note    Patient Details  Name: Jerry Knight MRN: [id number] Date of Birth: 1961-09-08  [facility] CM/SW Contact  Jacalyn Lefevre Edson Snowball, RN Phone Number: 08/27/2022, 10:39 AM  Clinical Narrative:     Followed back up with patient regarding home health agency . He has not discussed it yet with his wife. Wife is coming to hospital tomorrow , they will discuss it then.   Once decision made TOC will need to confirm agency in network with his insurance and has staffing to accept referral. Patient voiced understanding   Wife will need to learn wound care, drain care and ostomy care.   TOC will continue to follow.   Expected Discharge Plan: [place] Barriers to Discharge: Continued Medical Work up  Expected Discharge Plan and Services Expected Discharge Plan: [place]   Discharge Planning Services: CM Consult Post Acute Care Choice: [place] arrangements for the past 2 months: Single Family Home                 DME Arranged: N/A DME Agency: NA       HH Arranged: RN           Social Determinants of Health (SDOH) Interventions    Readmission Risk Interventions     No data to display

## 2022-08-27 NOTE — Progress Notes (Signed)
11 Days Post-Op  Subjective: CC: Left-sided abdominal pain is controlled with PCA and prn meds.  No nausea or vomiting.  Tolerating FLD.  Voiding.  No output in ostomy bag.  Walked in the halls yesterday.  Objective: Vital signs in last 24 hours: Temp:  [98 F (36.7 C)-98.6 F (37 C)] 98.6 F (37 C) (09/26 0757) Pulse Rate:  [93-102] 102 (09/26 0757) Resp:  [7-17] 7 (09/26 0806) BP: (95-111)/(56-70) 110/70 (09/26 0757) SpO2:  [97 %-100 %] 100 % (09/26 0806) FiO2 (%):  [27 %-29 %] 29 % (09/26 0628) Last BM Date : 08/27/22  Intake/Output from previous day: 09/25 0701 - 09/26 0700 In: 1125.9 [P.O.:297; I.V.:763.9; IV Piggyback:50] Out: 4097 [DZHGD:9242; Drains:90] Intake/Output this shift: Total I/O In: -  Out: 300 [Urine:300]  PE: General: pleasant, WD, male who is laying in bed in NAD Lungs: Respiratory effort nonlabored Abd: Soft, ND, some L sided ttp without rigidity or guarding, colostomy with scant sweat in bag - no gas or stool. Stoma pink and viable. Surgical JP drain with scant thick, purulent like output similar to yesterday - appears similar amount as yesterday and there was no output documented on I/O. IR drain with bloody purulent output. Midline wound stable with separation similar to yesterday, some granulation tissue overlying and less fibrinous exudate compared to prior.  No obvious evisceration.   Lab Results:  Recent Labs    08/25/22 0325 08/26/22 0319  WBC 15.2* 15.3*  HGB 9.1* 9.5*  HCT 27.1* 30.1*  PLT 380 457*   BMET Recent Labs    08/25/22 0325 08/26/22 0319  NA 135 135  K 4.4 4.1  CL 102 102  CO2 26 24  GLUCOSE 195* 122*  BUN 9 10  CREATININE 0.54* 0.52*  CALCIUM 8.2* 8.6*   PT/INR Recent Labs    08/24/22 1415  LABPROT 14.5  INR 1.1   CMP     Component Value Date/Time   NA 135 08/26/2022 0319   NA 140 10/12/2020 1526   K 4.1 08/26/2022 0319   CL 102 08/26/2022 0319   CO2 24 08/26/2022 0319   GLUCOSE 122 (H) 08/26/2022  0319   BUN 10 08/26/2022 0319   BUN 10 10/12/2020 1526   CREATININE 0.52 (L) 08/26/2022 0319   CREATININE 0.77 01/27/2013 1105   CALCIUM 8.6 (L) 08/26/2022 0319   PROT 7.0 08/26/2022 0319   PROT 7.5 10/12/2020 1526   ALBUMIN 2.0 (L) 08/26/2022 0319   ALBUMIN 4.5 10/12/2020 1526   AST 18 08/26/2022 0319   ALT 13 08/26/2022 0319   ALKPHOS 82 08/26/2022 0319   BILITOT 0.5 08/26/2022 0319   BILITOT 0.3 10/12/2020 1526   GFRNONAA >60 08/26/2022 0319   GFRAA 123 10/12/2020 1526   Lipase     Component Value Date/Time   LIPASE 39 08/16/2022 0506    Studies/Results: No results found.  Anti-infectives: Anti-infectives (From admission, onward)    Start     Dose/Rate Route Frequency Ordered Stop   08/17/22 1630  piperacillin-tazobactam (ZOSYN) IVPB 3.375 g        3.375 g 12.5 mL/hr over 240 Minutes Intravenous Every 8 hours 08/17/22 1540     08/16/22 1500  piperacillin-tazobactam (ZOSYN) IVPB 3.375 g        3.375 g 100 mL/hr over 30 Minutes Intravenous STAT 08/16/22 1446 08/16/22 2032   08/16/22 0615  piperacillin-tazobactam (ZOSYN) IVPB 3.375 g        3.375 g 100 mL/hr over 30 Minutes Intravenous  Once 08/16/22 0609 08/16/22 0713        Assessment/Plan POD 11 ex lap partial sigmoid colectomy, colostomy by Dr. Oda Cogan on 9/15 for Perforated stercoral ulcer - History of chronic pain on suboxone. D/c PCA today and try to maximize PO pain control with IV PRN - Cont TPN - started 9/19 pm. Stay on FLD. AROBF - CT w/ extraperitoneal fluid collection in LLQ lateral to psoas. S/p IR drain 9/23. Cx with E. Coli, will discuss with MD about narrowing. Cont abx - Cont drain, monitor - Mepitel at base of midline with dry to dry dressings over bid - WOC RN following for new ostomy - Encourage ambulation and IS use   FEN - FLD, IVFs, TPN VTE - SCDs, lovenox ID - zosyn 9/14> Foley - out 9/18. voiding   Per primary Tobacco abuse/COPD - 2 ppd H/O substance abuse H/O opioid dependence  - now on suboxone prn H/O seizure d/o Chronic pancreatitis Zollinger-Ellison syndrome GERD Hypoalbuminemic - appears malnourished at baseline due to muscle wasting RA - mtx and enbrel held   LOS: 11 days    Jillyn Ledger , [facility] Surgery 08/27/2022, 9:13 AM Please see Amion for pager number during day hours 7:00am-4:30pm

## 2022-08-28 DIAGNOSIS — M542 Cervicalgia: Secondary | ICD-10-CM | POA: Diagnosis not present

## 2022-08-28 DIAGNOSIS — K631 Perforation of intestine (nontraumatic): Secondary | ICD-10-CM | POA: Diagnosis not present

## 2022-08-28 DIAGNOSIS — F419 Anxiety disorder, unspecified: Secondary | ICD-10-CM | POA: Diagnosis not present

## 2022-08-28 DIAGNOSIS — J410 Simple chronic bronchitis: Secondary | ICD-10-CM | POA: Diagnosis not present

## 2022-08-28 LAB — BASIC METABOLIC PANEL
Anion gap: 8 (ref 5–15)
BUN: 11 mg/dL (ref 6–20)
CO2: 26 mmol/L (ref 22–32)
Calcium: 9 mg/dL (ref 8.9–10.3)
Chloride: 103 mmol/L (ref 98–111)
Creatinine, Ser: 0.5 mg/dL — ABNORMAL LOW (ref 0.61–1.24)
GFR, Estimated: >60 mL/min (ref >60)
Glucose, Bld: 126 mg/dL — ABNORMAL HIGH (ref 70–99)
Potassium: 4.2 mmol/L (ref 3.5–5.1)
Sodium: 137 mmol/L (ref 135–145)

## 2022-08-28 LAB — CBC
HCT: 31.6 % — ABNORMAL LOW (ref 39.0–52.0)
Hemoglobin: 10.2 g/dL — ABNORMAL LOW (ref 13.0–17.0)
MCH: 30.5 pg (ref 26.0–34.0)
MCHC: 32.3 g/dL (ref 30.0–36.0)
MCV: 94.6 fL (ref 80.0–100.0)
Platelets: 558 10*3/uL — ABNORMAL HIGH (ref 150–400)
RBC: 3.34 MIL/uL — ABNORMAL LOW (ref 4.22–5.81)
RDW: 14.3 % (ref 11.5–15.5)
WBC: 13.7 10*3/uL — ABNORMAL HIGH (ref 4.0–10.5)
nRBC: 0 % (ref 0.0–0.2)

## 2022-08-28 LAB — AEROBIC/ANAEROBIC CULTURE W GRAM STAIN (SURGICAL/DEEP WOUND): Special Requests: NORMAL

## 2022-08-28 LAB — MAGNESIUM: Magnesium: 1.8 mg/dL (ref 1.7–2.4)

## 2022-08-28 LAB — GLUCOSE, CAPILLARY
Glucose-Capillary: 109 mg/dL — ABNORMAL HIGH (ref 70–99)
Glucose-Capillary: 114 mg/dL — ABNORMAL HIGH (ref 70–99)
Glucose-Capillary: 124 mg/dL — ABNORMAL HIGH (ref 70–99)
Glucose-Capillary: 125 mg/dL — ABNORMAL HIGH (ref 70–99)
Glucose-Capillary: 141 mg/dL — ABNORMAL HIGH (ref 70–99)
Glucose-Capillary: 142 mg/dL — ABNORMAL HIGH (ref 70–99)

## 2022-08-28 LAB — PHOSPHORUS: Phosphorus: 2.7 mg/dL (ref 2.5–4.6)

## 2022-08-28 MED ORDER — POLYETHYLENE GLYCOL 3350 17 G PO PACK
17.0000 g | PACK | Freq: Every day | ORAL | Status: DC
  Administered 2022-08-28: 17 g via ORAL
  Filled 2022-08-28: qty 1

## 2022-08-28 MED ORDER — BISACODYL 10 MG RE SUPP
10.0000 mg | Freq: Once | RECTAL | Status: AC
  Administered 2022-08-28: 10 mg via RECTAL
  Filled 2022-08-28: qty 1

## 2022-08-28 MED ORDER — TRAVASOL 10 % IV SOLN
INTRAVENOUS | Status: AC
  Filled 2022-08-28: qty 873.6

## 2022-08-28 MED ORDER — DOCUSATE SODIUM 100 MG PO CAPS
100.0000 mg | ORAL_CAPSULE | Freq: Two times a day (BID) | ORAL | Status: DC
  Administered 2022-08-28 – 2022-09-07 (×21): 100 mg via ORAL
  Filled 2022-08-28 (×22): qty 1

## 2022-08-28 NOTE — Progress Notes (Signed)
PT Cancellation Note  Patient Details Name: ALEXIUS HANGARTNER MRN: [id number] DOB: 04-09-1961   Cancelled Treatment:    Reason Eval/Treat Not Completed: Other (comment) Pt politely declining due to pain and pending receiving a suppository through stoma; encouraged walk with mobility tech this afternoon.  Wyona Almas, PT, DPT Acute Rehabilitation Services Office [place] 08/28/2022, 11:46 AM

## 2022-08-28 NOTE — Progress Notes (Signed)
12 Days Post-Op  Subjective: CC: Reports crampy/bloated pain over his left abdomen today. Some air/flatus in ostomy bag. Passed some mucus. Some nausea. No vomiting. Not much po intake yesterday. Voiding. Mobilized well.   Objective: Vital signs in last 24 hours: Temp:  [98.4 F (36.9 C)-98.8 F (37.1 C)] 98.4 F (36.9 C) (09/27 0747) Pulse Rate:  [104-116] 105 (09/27 0747) Resp:  [15-20] 15 (09/27 0747) BP: (110-134)/(59-89) 128/89 (09/27 0747) SpO2:  [96 %-97 %] 96 % (09/27 0747) Weight:  [50.2 kg] 50.2 kg (09/27 0515) Last BM Date : 08/27/22  Intake/Output from previous day: 09/26 0701 - 09/27 0700 In: 2265.9 [P.O.:1080; I.V.:803.5; IV Piggyback:372.4] Out: 1870 [Urine:1800; Drains:70] Intake/Output this shift: No intake/output data recorded.  PE: General: pleasant, WD, male who is laying in bed in NAD Lungs: Respiratory effort nonlabored Abd: Soft, maybe some mild distension, some L sided ttp without rigidity or guarding, colostomy with - no gas or stool (he reports he just burped this). Stoma pink and viable as seen in picture 2. Discussed with MD plan to digitize stoma, this appears patent passed level of fascia, I can feel hard stool at the tip of my digit. Surgical JP drain with scant thick, purulent like output similar to yesterday - appears similar amount as yesterday and there was no output documented on I/O - drain stripped. IR drain with bloody purulent output, 70cc/24 hours. Midline wound stable separation, some granulation tissue overlying and less fibrinous exudate compared to prior.  No obvious evisceration. See picture below.      Lab Results:  Recent Labs    08/26/22 0319 08/28/22 0307  WBC 15.3* 13.7*  HGB 9.5* 10.2*  HCT 30.1* 31.6*  PLT 457* 558*   BMET Recent Labs    08/26/22 0319 08/28/22 0307  NA 135 137  K 4.1 4.2  CL 102 103  CO2 24 26  GLUCOSE 122* 126*  BUN 10 11  CREATININE 0.52* 0.50*  CALCIUM 8.6* 9.0   PT/INR No results  for input(s): "LABPROT", "INR" in the last 72 hours. CMP     Component Value Date/Time   NA 137 08/28/2022 0307   NA 140 10/12/2020 1526   K 4.2 08/28/2022 0307   CL 103 08/28/2022 0307   CO2 26 08/28/2022 0307   GLUCOSE 126 (H) 08/28/2022 0307   BUN 11 08/28/2022 0307   BUN 10 10/12/2020 1526   CREATININE 0.50 (L) 08/28/2022 0307   CREATININE 0.77 01/27/2013 1105   CALCIUM 9.0 08/28/2022 0307   PROT 7.0 08/26/2022 0319   PROT 7.5 10/12/2020 1526   ALBUMIN 2.0 (L) 08/26/2022 0319   ALBUMIN 4.5 10/12/2020 1526   AST 18 08/26/2022 0319   ALT 13 08/26/2022 0319   ALKPHOS 82 08/26/2022 0319   BILITOT 0.5 08/26/2022 0319   BILITOT 0.3 10/12/2020 1526   GFRNONAA >60 08/28/2022 0307   GFRAA 123 10/12/2020 1526   Lipase     Component Value Date/Time   LIPASE 39 08/16/2022 0506    Studies/Results: No results found.  Anti-infectives: Anti-infectives (From admission, onward)    Start     Dose/Rate Route Frequency Ordered Stop   08/27/22 1300  Ampicillin-Sulbactam (UNASYN) 3 g in sodium chloride 0.9 % 100 mL IVPB        3 g 200 mL/hr over 30 Minutes Intravenous Every 8 hours 08/27/22 1202 08/29/22 1259   08/17/22 1630  piperacillin-tazobactam (ZOSYN) IVPB 3.375 g  Status:  Discontinued  3.375 g 12.5 mL/hr over 240 Minutes Intravenous Every 8 hours 08/17/22 1540 08/27/22 1202   08/16/22 1500  piperacillin-tazobactam (ZOSYN) IVPB 3.375 g        3.375 g 100 mL/hr over 30 Minutes Intravenous STAT 08/16/22 1446 08/16/22 2032   08/16/22 0615  piperacillin-tazobactam (ZOSYN) IVPB 3.375 g        3.375 g 100 mL/hr over 30 Minutes Intravenous  Once 08/16/22 0609 08/16/22 0713        Assessment/Plan POD 12 ex lap partial sigmoid colectomy, colostomy by Dr. Oda Cogan on 9/15 for Perforated stercoral ulcer - History of chronic pain on suboxone. Off PCA. Maximize PO pain control with IV PRN - Cont TPN - started 9/19 pm. CLD today with some nausea overnight. AROBF. Stoma viable  and patent on exam. There was some hard stool palpated at the tip of my digit when digitizing the stoma, not sure if he is to close out from surgery to do some enema's or suppositories through the ostomy but will discuss with MD. - CT w/ extraperitoneal fluid collection in LLQ lateral to psoas. S/p IR drain 9/23. Cx with E. Coli. Narrowed to Unasyn. Cont abx 5d after drainage (through 9/28).  - Cont drain, monitor - Mepitel at base of midline with dry to dry dressings over bid - WOC RN following for new ostomy - Encourage ambulation and IS use   FEN - CLD, IVFs, TPN VTE - SCDs, Lovenox ID - zosyn 9/14 - 9/26. Unasyn 9/26 - 9/28. Afebrile. Tachycardic this am. No systolic hypotension. WBC down at 13.7.  Foley - out 9/18. voiding   Per primary Tobacco abuse/COPD - 2 ppd H/O substance abuse H/O opioid dependence - now on suboxone prn H/O seizure d/o Chronic pancreatitis Zollinger-Ellison syndrome GERD Hypoalbuminemic - appears malnourished at baseline due to muscle wasting RA - mtx and enbrel held   LOS: 12 days    Jillyn Ledger , [facility] 08/28/2022, 8:46 AM Please see Amion for pager number during day hours 7:00am-4:30pm

## 2022-08-28 NOTE — Progress Notes (Signed)
Mobility Specialist - Progress Note   08/28/22 1559  Mobility  Activity Refused mobility    Pt stated that he was "having a very bad day" and did not feel well enough to move OOB. Will follow up as time allows.   Paulla Dolly Mobility Specialist

## 2022-08-28 NOTE — Progress Notes (Signed)
[facility]                                                                              Jerry Knight, is a 61 y.o. male, DOB - 1961-08-24, DZH:[id number] Admit date - 08/16/2022    Outpatient Primary MD for the patient is Jerry Knight, Jerry Knight, [place]  LOS - 12  days  Chief Complaint  Patient presents with   Abdominal Pain       Brief summary   Jerry Knight is a 61 y.o. male with medical history significant of  COPD, chronic pancreatitis, rheumatoid arthritis on Enbrel, history of seizures not on an AED, chronic pain on Suboxone, polysubstance abuse, Zollinger-Ellison syndrome, and constipation who presented with complaints of  lower abdominal pain.  Imaging remarkable for colonic perforation/obstruction  Patient was admitted for further work-up   Assessment & Plan    Principal Problem: Sepsis secondary to bowel perforation ([place]) due to stercoral ulcer, left  retroperitoneal abscess -CT revealed colonic perforation in the region of the sigmoid colon thought secondary to impacted stool.  -Met sepsis criteria on admission with tachycardia, tachypnea, leukocytosis, lactic acidosis -Surgery consulted, underwent emergent exploratory laparotomy, left colostomy and partial sigmoid colectomy on 9/15 -Repeat CT on 9/22 with new 7 cm fluid collection in the LLQ s/p drain placement by IR on 9/23, cultures showing abundant E. Coli -Continue full liquid diet, TPN, antibiotics transitioned to IV Unasyn -Diet advanced to full liquids, continue TPN.   -Small formed stool in stoma today   Acute on chronic pain  -Outpatient on Suboxone -Off the PCA now complaining of uncontrolled pain and abdominal cramps last night -Continue oral oxycodone PRN and morphine  IV PRN for breakthrough pain.  - continue tizanidine, continue Lidoderm patch as needed   Normocytic anemia -Likely anemia of chronic disease -HTN stable 10.2  Hypokalemia, hyponatremia -Continue TPN  COPD,  without exacerbation - Chest x-ray with chronic interstitial lung changes with bibasilar atelectasis. -Stable, no wheezing, continue albuterol nebs as needed   History of rheumatoid arthritis -Outpatient on Enbrel 50 mg and methotrexate 10 mg on Saturdays -Currently regimen on hold  Insomnia, anxiety -For now continue Ativan as needed for anxiety -Continue trazodone 150 mg at bedtime    GERD -Continue PPI    Tobacco abuse Patient reportedly smokes 2 packs of cigarettes per day on average. - cont Nicotine patch  Moderate to severe protein calorie malnutrition due to acute illness, hypoalbuminemia 1.9, muscle wasting Estimated body mass index is 18.14 kg/m as calculated from the following:   Height as of this encounter: 5' 5.5" (1.664 m).   Weight as of this encounter: 50.2 kg. -Currently on TPN  Code Status: Full code DVT Prophylaxis:  enoxaparin (LOVENOX) injection 40 mg Start: 08/25/22 1000 SCD's Start: 08/16/22 1754 SCDs Start: 08/16/22 1600   Level of Care: Level of care: Telemetry Surgical Family Communication: updated patient's wife at the bedside on 9/25, patient alert and oriented.  Disposition Plan:      Remains inpatient appropriate: Not stable for discharge until cleared by general surgery   Procedures:  9/15 : ex lap partial sigmoid colectomy, colostomy  9/23: Drain into left retroperitoneal abscess with 140 mL purulent fluid aspirated  Consultants:   General surgery Interventional radiology  Antimicrobials:   Anti-infectives (From admission, onward)    Start     Dose/Rate Route Frequency Ordered Stop   08/27/22 1300  Ampicillin-Sulbactam (UNASYN) 3 g in sodium chloride 0.9 % 100 mL IVPB        3 g 200 mL/hr over 30 Minutes Intravenous Every 8 hours 08/27/22 1202 08/29/22 1259   08/17/22 1630  piperacillin-tazobactam (ZOSYN) IVPB 3.375 g  Status:  Discontinued        3.375 g 12.5 mL/hr over 240 Minutes Intravenous Every 8 hours 08/17/22 1540  08/27/22 1202   08/16/22 1500  piperacillin-tazobactam (ZOSYN) IVPB 3.375 g        3.375 g 100 mL/hr over 30 Minutes Intravenous STAT 08/16/22 1446 08/16/22 2032   08/16/22 0615  piperacillin-tazobactam (ZOSYN) IVPB 3.375 g        3.375 g 100 mL/hr over 30 Minutes Intravenous  Once 08/16/22 0609 08/16/22 0713          Medications  acetaminophen  1,000 mg Oral Q6H   Chlorhexidine Gluconate Cloth  6 each Topical Daily   docusate sodium  100 mg Oral BID   enoxaparin (LOVENOX) injection  40 mg Subcutaneous Q24H   feeding supplement  237 mL Oral TID BM   lidocaine  1 patch Transdermal Q24H   nicotine  21 mg Transdermal QHS   pantoprazole (PROTONIX) IV  40 mg Intravenous Q12H   polyethylene glycol  17 g Oral Daily   sodium chloride flush  3 mL Intravenous Q12H   sodium chloride flush  5 mL Intracatheter Q8H   tiZANidine  2 mg Oral Q8H   traZODone  150 mg Oral QHS      Subjective:   [name] was seen and examined today.  States did not sleep well, pain uncontrolled, had abdominal cramps with nausea last night.  Small formed stool in ostomy.  Tolerating diet  Objective:   Vitals:   08/27/22 1550 08/27/22 2103 08/28/22 0515 08/28/22 0747  BP: (!) 110/59 123/81 134/79 128/89  Pulse: (!) 104 (!) 107 (!) 116 (!) 105  Resp: 16 16 20 15   Temp: 98.8 F (37.1 C) 98.8 F (37.1 C) 98.4 F (36.9 C) 98.4 F (36.9 C)  TempSrc: Oral Oral Oral Oral  SpO2: 97% 96% 97% 96%  Weight:   50.2 kg   Height:        Intake/Output Summary (Last 24 hours) at 08/28/2022 1401 Last data filed at 08/28/2022 0600 Gross per 24 hour  Intake 1780.88 ml  Output 1360 ml  Net 420.88 ml     Wt Readings from Last 3 Encounters:  08/28/22 50.2 kg  07/27/22 53.5 kg  11/09/21 52.2 kg   Physical Exam General: Alert and oriented x 3, NAD Cardiovascular: S1 S2 clear, RRR.  Respiratory: CTAB, no wheezing Gastrointestinal: Soft, stoma pink and viable, dressing intact midline, small formed stool in  ostomy Ext: no pedal edema bilaterally Neuro: no new deficits Psych: anxious and upset  Data Reviewed:  I have personally reviewed following labs    CBC Lab Results  Component Value Date   WBC 13.7 (H) 08/28/2022   RBC 3.34 (L) 08/28/2022   HGB 10.2 (L) 08/28/2022   HCT 31.6 (L) 08/28/2022   MCV 94.6 08/28/2022   MCH 30.5 08/28/2022   PLT 558 (H) 08/28/2022   MCHC 32.3 08/28/2022   RDW 14.3 08/28/2022  LYMPHSABS 1.1 08/16/2022   MONOABS 1.4 (H) 08/16/2022   EOSABS 0.0 08/16/2022   BASOSABS 0.0 00/76/2263     Last metabolic panel Lab Results  Component Value Date   NA 137 08/28/2022   K 4.2 08/28/2022   CL 103 08/28/2022   CO2 26 08/28/2022   BUN 11 08/28/2022   CREATININE 0.50 (L) 08/28/2022   GLUCOSE 126 (H) 08/28/2022   GFRNONAA >60 08/28/2022   GFRAA 123 10/12/2020   CALCIUM 9.0 08/28/2022   PHOS 2.7 08/28/2022   PROT 7.0 08/26/2022   ALBUMIN 2.0 (L) 08/26/2022   LABGLOB 3.0 10/12/2020   AGRATIO 1.5 10/12/2020   BILITOT 0.5 08/26/2022   ALKPHOS 82 08/26/2022   AST 18 08/26/2022   ALT 13 08/26/2022   ANIONGAP 8 08/28/2022    CBG (last 3)  Recent Labs    08/28/22 0512 08/28/22 0744 08/28/22 1118  GLUCAP 125* 114* 141*      Coagulation Profile: Recent Labs  Lab 08/24/22 1415  INR 1.1     Radiology Studies: I have personally reviewed the imaging studies  No results found.     Estill Cotta M.D. [facility] 08/28/2022, 2:01 PM  Available via Epic secure chat 7am-7pm After 7 pm, please refer to night coverage provider listed on amion.

## 2022-08-28 NOTE — Progress Notes (Signed)
Referring Physician(s): Dr Ayesha Rumpf  Supervising Physician: Juliet Rude  Patient Status:  [facility] - In-pt  Chief Complaint:  Abd pain, colonic perforation/obstruction/stercoral ulcer s/p ex lap and colostomy on 08/16/22, developed left retroperitoneal gas/fluid collection  S/p aspiration and drain placement on 08/24/22 by Dr. Laurence Ferrari   Subjective:  Patient laying in bed, NAD.  Reports that he is having rough day today, was nausea all night and then became very gaseous during the day. Currently denies n/v, still has mild abd pain.   Allergies: Ambien [zolpidem tartrate], Iohexol, Lunesta [eszopiclone], Seroquel [quetiapine fumarate], and Sonata [zaleplon]  Medications: Prior to Admission medications   Medication Sig Start Date End Date Taking? Authorizing Provider  amitriptyline (ELAVIL) 50 MG tablet Take 100 mg by mouth at bedtime.   Yes [provider]  atorvastatin (LIPITOR) 20 MG tablet Take 1 tablet (20 mg total) by mouth daily. 02/19/21  Yes Just, Laurita Quint, FNP  buprenorphine-naloxone (SUBOXONE) 8-2 mg SUBL SL tablet Place 1 tablet under the tongue daily.   Yes [provider]  cyclobenzaprine (FLEXERIL) 10 MG tablet Take 1 tablet (10 mg total) by mouth 3 (three) times daily. 02/19/21  Yes Just, Laurita Quint, FNP  etanercept (ENBREL) 50 MG/ML injection Inject 50 mg into the skin once a week.   Yes [provider]  folic acid (FOLVITE) 1 MG tablet Take 1 mg by mouth daily. 08/06/22  Yes [provider]  linaclotide Rolan Lipa) 145 MCG CAPS capsule Take 1 capsule (145 mcg total) by mouth daily before breakfast. 02/19/21  Yes Just, Laurita Quint, FNP  LORazepam (ATIVAN) 1 MG tablet Take 1 mg by mouth 3 (three) times daily. 07/18/22  Yes [provider]  methotrexate (RHEUMATREX) 2.5 MG tablet Take 10 mg by mouth once a week. 06/08/22  Yes [provider]  mirtazapine (REMERON) 45 MG tablet Take 1 tablet (45 mg total) by mouth at bedtime.  02/19/21  Yes Just, Laurita Quint, FNP  omeprazole (PRILOSEC) 20 MG capsule TAKE 1 CAPSULE(20 MG) BY MOUTH DAILY 02/19/21  Yes Just, Laurita Quint, FNP  traZODone (DESYREL) 50 MG tablet Take 3-4 tablets (150-200 mg total) by mouth at bedtime. 02/19/21  Yes Just, Laurita Quint, FNP     Vital Signs: BP 118/71 (BP Location: Left Arm)   Pulse (!) 101   Temp 99.2 F (37.3 C) (Oral)   Resp 14   Ht 5' 5.5" (1.664 m)   Wt 110 lb 10.7 oz (50.2 kg)   SpO2 97%   BMI 18.14 kg/m   Physical Exam Vitals reviewed.  Constitutional:      General: He is not in acute distress. Pulmonary:     Effort: Pulmonary effort is normal.  Abdominal:     General: Abdomen is flat.  Skin:    General: Skin is warm and dry.     Comments: Positive LLQ drain to a suction bulb. Site is unremarkable with no erythema, edema, tenderness, bleeding or drainage. Suture and stat lock in place. Dressing is clean, dry, and intact. 10 ml of  purulent, pink tinged fluid noted in the bulb. Drain aspirates and flushes well.    Neurological:     Mental Status: He is alert.  Psychiatric:        Behavior: Behavior normal.     Imaging: CT GUIDED VISCERAL FLUID DRAIN BY PERC CATH  Result Date: 08/24/2022 INDICATION: 61 year old male with fluid and gas collection in left retroperitoneal space concerning for abscess. EXAM: CT GUIDED DRAINAGE  OF retroperitoneal ABSCESS TECHNIQUE: Multidetector CT imaging of the abdomen was performed following the standard protocol without IV contrast. RADIATION DOSE REDUCTION: This exam was performed according to the departmental dose-optimization program which includes automated exposure control, adjustment of the mA and/or kV according to patient size and/or use of iterative reconstruction technique. MEDICATIONS: The patient is currently admitted to the hospital and receiving intravenous antibiotics. The antibiotics were administered within an appropriate time frame prior to the initiation of the procedure.  ANESTHESIA/SEDATION: Moderate (conscious) sedation was employed during this procedure. A total of Versed mg and Fentanyl mcg was administered intravenously by the radiology nurse. Total intra-service moderate Sedation Time: minutes. The patient's level of consciousness and vital signs were monitored continuously by radiology nursing throughout the procedure under my direct supervision. COMPLICATIONS: None immediate. TECHNIQUE: Informed written consent was obtained from the patient after a thorough discussion of the procedural risks, benefits and alternatives. All questions were addressed. Maximal Sterile Barrier Technique was utilized including caps, mask, sterile gowns, sterile gloves, sterile drape, hand hygiene and skin antiseptic. A timeout was performed prior to the initiation of the procedure. PROCEDURE: The operative field was prepped with Chlorhexidine in a sterile fashion, and a sterile drape was applied covering the operative field. A sterile gown and sterile gloves were used for the procedure. Local anesthesia was provided with 1% Lidocaine. FINDINGS: A planning axial CT scan was performed. Fluid and gas collection in the left retroperitoneal space posterior to the psoas muscle was successfully identified. A suitable skin entry site was selected and marked. The skin was sterilely prepped and draped in the standard fashion with chlorhexidine skin prep. Local anesthesia was attained by infiltration with 1% lidocaine. A small dermatotomy was made. Under intermittent CT guidance, an 18 gauge trocar needle was advanced into the fluid and gas collection. A 0.035 wire was then coiled in the collection. The skin tract was dilated to twelve [place]. A Cook 12 [place] all-purpose drainage catheter was advanced over the wire and formed. Aspiration yields 140 mL of purulent foul-smelling fluid. Samples were sent for Gram stain and culture. The abscess cavity was then lavaged with saline and the drain secured to the  skin with 0 Prolene suture. The drain was connected to JP bulb suction. IMPRESSION: Successful placement of 12 French abscess drain with evacuation of 140 mL purulent fluid. Samples were sent for Gram stain and culture. Electronically Signed   By: Jacqulynn Cadet M.D.   On: 08/24/2022 16:54    Labs:  CBC: Recent Labs    08/24/22 0356 08/25/22 0325 08/26/22 0319 08/28/22 0307  WBC 21.1* 15.2* 15.3* 13.7*  HGB 9.0* 9.1* 9.5* 10.2*  HCT 27.2* 27.1* 30.1* 31.6*  PLT 328 380 457* 558*    COAGS: Recent Labs    08/24/22 1415  INR 1.1    BMP: Recent Labs    08/24/22 0356 08/25/22 0325 08/26/22 0319 08/28/22 0307  NA 131* 135 135 137  K 4.0 4.4 4.1 4.2  CL 100 102 102 103  CO2 '26 26 24 26  '$ GLUCOSE 120* 195* 122* 126*  BUN '9 9 10 11  '$ CALCIUM 8.1* 8.2* 8.6* 9.0  CREATININE 0.56* 0.54* 0.52* 0.50*  GFRNONAA >60 >60 >60 >60    LIVER FUNCTION TESTS: Recent Labs    08/21/22 0307 08/22/22 0418 08/25/22 0325 08/26/22 0319  BILITOT 0.7 0.6 0.4 0.5  AST 14* '15 16 18  '$ ALT '10 10 12 13  '$ ALKPHOS 42 47 74 82  PROT 5.4* 5.6* 6.3*  7.0  ALBUMIN 1.9* 1.9* 1.8* 2.0*    Assessment and Plan:  61 y.o. 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 Laurence Ferrari.   VS tachycardic at times but stable otherwise, afebrile  WBC trending down   Cx E coil   Drain Location: LLQ Size: Fr size: 12 Fr Date of placement: 08/24/22   Currently to: Drain collection device: suction bulb 24 hour output:  Output by Drain (mL) 08/26/22 0701 - 08/26/22 1900 08/26/22 1901 - 08/27/22 0700 08/27/22 0701 - 08/27/22 1900 08/27/22 1901 - 08/28/22 0700 08/28/22 0701 - 08/28/22 1530  Closed System Drain 1 Right Abdomen Bulb (JP) 19 Fr. 0 0 0 0   Closed System Drain 3 Lateral;Left LLQ Bulb (JP) 12 Fr. 50 40 60 10     Interval imaging/drain  manipulation:  None   Current examination: Flushes/aspirates easily.  Insertion site unremarkable. Suture and stat lock in place. Dressed appropriately.   Plan: Continue TID flushes with 5 cc NS. Record output Q shift. Dressing changes QD or PRN if soiled.  Call IR APP or on call IR MD if difficulty flushing or sudden change in drain output.  Repeat imaging/possible drain injection once output < 10 mL/QD (excluding flush material). Consideration for drain removal if output is < 10 mL/QD (excluding flush material), pending discussion with the providing surgical service.  Discharge planning: Please contact IR APP or on call IR MD prior to patient d/c to ensure appropriate follow up plans are in place. Typically patient will follow up with IR clinic 10-14 days post d/c for repeat imaging/possible drain injection. IR scheduler will contact patient with date/time of appointment. Patient will need to flush drain QD with 5 cc NS, record output QD, dressing changes every 2-3 days or earlier if soiled.   IR will continue to follow - please call with questions or concerns.   Electronically Signed: Tera Mater, PA-C 08/28/2022, 3:21 PM   I spent a total of 15 Minutes at the the patient's bedside AND on the patient's hospital floor or unit, greater than 50% of which was counseling/coordinating care for LLQ drain placement.   This chart was dictated using voice recognition software.  Despite best efforts to proofread,  errors can occur which can change the documentation meaning.

## 2022-08-28 NOTE — Progress Notes (Signed)
PHARMACY - TOTAL PARENTERAL NUTRITION CONSULT NOTE   Indication: Prolonged ileus  Patient Measurements: Height: 5' 5.5" (166.4 cm) Weight: 50.2 kg (110 lb 10.7 oz) IBW/kg (Calculated) : 62.65 TPN AdjBW (KG): 52.6 Body mass index is 18.14 kg/m.  Usual Weight: 115 lbs  Assessment:  61 yo male with a history of chronic pain syndrome on Suboxone, chronic pancreatitis with prior surgery for pseudocyst removal in the 1990s, prior BII in the 1980s, COPD, rheumatoid arthritis on Enbrel, history of seizures not on an AED, and Zollinger-Ellison syndrome who presented with acute onset of abdominal pain along with N/V. He was found to have a WBC of 22K, lactic acid of 3.1 and a CT scan concerning for a colonic perforation. Pharmacy consulted to initiate TPN for prolonged ileus.  Glucose / Insulin: no hx DM, CBGs controlled   Electrolytes: Na 137, K 4.2, Cl 103, CoCa 10.6, Phos 2.7, Mg 1.8. Other electrolytes wnl.  Renal: Scr 0.50 (bsl < 1); BUN wnl Hepatic: LFTs wnl. Albumin 2. TG 132  Intake / Output; MIVF: UOP 1.5 ml/kg/hr; R JP drain 0 ml. L JP drain 70 ml. 0 ml stool.   GI Imaging: 9/15 colonic perforation in the region of the sigmoid colon likely secondary to impacted stool GI Surgeries / Procedures:  9/15 Exploratory laparotomy, Left colostomy and partial sigmoid colectomy  9/23: drain placed for L retroperitoneal abscess (133m fluid aspirated)  Central access: 08/20/22 TPN start date:  08/20/22  Nutritional Goals: Goal TPN rate is 70 mL/hr (provides 87 g of protein and 1703 kcals per day)  RD Assessment: Estimated Needs Total Energy Estimated Needs: 1700-1900 Total Protein Estimated Needs: 85-100 grams Total Fluid Estimated Needs: >/= 1.7 L   Current Nutrition:  9/19 NPO 9/24 CLD 9/25 FLD  Plan:  Continue TPN at goal rate of 787mhr to provide 87g AA and 1703 kcal Electrolytes in TPN:  Decrease Ca to 2 mEq/L. Increase phos to 18 mmol/L. Continue Na 150 mEq/L, K 45 mEq/L, Mg  10 mEq/L. Continue Cl:Ac 1:1.  Add standard MVI and trace elements to TPN Monitor CBGs - continue off SSI since CBGs controlled Monitor TPN labs on Mon/Thurs, as needed Follow up toleration and advancement of diet -  FLD  Thank you for allowing pharmacy to be a part of this patient's care.  GrCristela FeltPharmD, BCPS Clinical Pharmacist 08/28/2022 8:00 AM

## 2022-08-29 DIAGNOSIS — K631 Perforation of intestine (nontraumatic): Secondary | ICD-10-CM | POA: Diagnosis not present

## 2022-08-29 LAB — COMPREHENSIVE METABOLIC PANEL
ALT: 14 U/L (ref 0–44)
AST: 19 U/L (ref 15–41)
Albumin: 2.2 g/dL — ABNORMAL LOW (ref 3.5–5.0)
Alkaline Phosphatase: 76 U/L (ref 38–126)
Anion gap: 9 (ref 5–15)
BUN: 13 mg/dL (ref 6–20)
CO2: 25 mmol/L (ref 22–32)
Calcium: 8.6 mg/dL — ABNORMAL LOW (ref 8.9–10.3)
Chloride: 106 mmol/L (ref 98–111)
Creatinine, Ser: 0.49 mg/dL — ABNORMAL LOW (ref 0.61–1.24)
GFR, Estimated: >60 mL/min (ref >60)
Glucose, Bld: 130 mg/dL — ABNORMAL HIGH (ref 70–99)
Potassium: 4 mmol/L (ref 3.5–5.1)
Sodium: 140 mmol/L (ref 135–145)
Total Bilirubin: 0.3 mg/dL (ref 0.3–1.2)
Total Protein: 7.3 g/dL (ref 6.5–8.1)

## 2022-08-29 LAB — MAGNESIUM: Magnesium: 1.9 mg/dL (ref 1.7–2.4)

## 2022-08-29 LAB — CBC
HCT: 30.3 % — ABNORMAL LOW (ref 39.0–52.0)
Hemoglobin: 9.7 g/dL — ABNORMAL LOW (ref 13.0–17.0)
MCH: 30.4 pg (ref 26.0–34.0)
MCHC: 32 g/dL (ref 30.0–36.0)
MCV: 95 fL (ref 80.0–100.0)
Platelets: 633 10*3/uL — ABNORMAL HIGH (ref 150–400)
RBC: 3.19 MIL/uL — ABNORMAL LOW (ref 4.22–5.81)
RDW: 14.4 % (ref 11.5–15.5)
WBC: 19.3 10*3/uL — ABNORMAL HIGH (ref 4.0–10.5)
nRBC: 0 % (ref 0.0–0.2)

## 2022-08-29 LAB — GLUCOSE, CAPILLARY
Glucose-Capillary: 126 mg/dL — ABNORMAL HIGH (ref 70–99)
Glucose-Capillary: 127 mg/dL — ABNORMAL HIGH (ref 70–99)
Glucose-Capillary: 129 mg/dL — ABNORMAL HIGH (ref 70–99)
Glucose-Capillary: 150 mg/dL — ABNORMAL HIGH (ref 70–99)

## 2022-08-29 LAB — TRIGLYCERIDES: Triglycerides: 107 mg/dL (ref <150)

## 2022-08-29 LAB — PHOSPHORUS: Phosphorus: 3 mg/dL (ref 2.5–4.6)

## 2022-08-29 MED ORDER — LORAZEPAM 2 MG/ML IJ SOLN
0.5000 mg | Freq: Four times a day (QID) | INTRAMUSCULAR | Status: DC | PRN
  Administered 2022-08-29 – 2022-09-08 (×19): 0.5 mg via INTRAVENOUS
  Filled 2022-08-29 (×20): qty 1

## 2022-08-29 MED ORDER — POLYETHYLENE GLYCOL 3350 17 G PO PACK
17.0000 g | PACK | Freq: Two times a day (BID) | ORAL | Status: DC
  Administered 2022-08-29 – 2022-09-06 (×12): 17 g via ORAL
  Filled 2022-08-29 (×14): qty 1

## 2022-08-29 MED ORDER — ATORVASTATIN CALCIUM 10 MG PO TABS
20.0000 mg | ORAL_TABLET | Freq: Every day | ORAL | Status: DC
  Administered 2022-08-30 – 2022-09-11 (×13): 20 mg via ORAL
  Filled 2022-08-29 (×13): qty 2

## 2022-08-29 MED ORDER — PANTOPRAZOLE SODIUM 40 MG PO TBEC
40.0000 mg | DELAYED_RELEASE_TABLET | Freq: Every day | ORAL | Status: DC
  Administered 2022-08-30 – 2022-09-11 (×13): 40 mg via ORAL
  Filled 2022-08-29 (×13): qty 1

## 2022-08-29 MED ORDER — MORPHINE SULFATE (PF) 2 MG/ML IV SOLN
2.0000 mg | INTRAVENOUS | Status: DC | PRN
  Administered 2022-08-29 – 2022-08-30 (×9): 2 mg via INTRAVENOUS
  Administered 2022-08-31 – 2022-09-01 (×11): 4 mg via INTRAVENOUS
  Administered 2022-09-02: 2 mg via INTRAVENOUS
  Administered 2022-09-02 (×2): 4 mg via INTRAVENOUS
  Administered 2022-09-02 – 2022-09-03 (×5): 2 mg via INTRAVENOUS
  Administered 2022-09-03 – 2022-09-04 (×5): 4 mg via INTRAVENOUS
  Filled 2022-08-29 (×5): qty 1
  Filled 2022-08-29 (×2): qty 2
  Filled 2022-08-29: qty 1
  Filled 2022-08-29 (×2): qty 2
  Filled 2022-08-29: qty 1
  Filled 2022-08-29 (×4): qty 2
  Filled 2022-08-29: qty 1
  Filled 2022-08-29 (×3): qty 2
  Filled 2022-08-29 (×3): qty 1
  Filled 2022-08-29 (×2): qty 2
  Filled 2022-08-29 (×2): qty 1
  Filled 2022-08-29 (×3): qty 2
  Filled 2022-08-29 (×2): qty 1
  Filled 2022-08-29 (×2): qty 2

## 2022-08-29 MED ORDER — TRAVASOL 10 % IV SOLN
INTRAVENOUS | Status: AC
  Filled 2022-08-29: qty 873.6

## 2022-08-29 NOTE — Consult Note (Signed)
[place] Nurse ostomy follow up Patient receiving care in [facility] [phone] Patient complaining of feeling very bloated and gassy this morning.  Stoma type/location: LLQ colostomy Stomal assessment/size: 1 1/2" pink moist and budded with opening over to the patients right side Peristomal assessment: intact Treatment options for stomal/peristomal skin: barrier ring Output: tan mucous Ostomy pouching: 2pc. 2 1/4" pouch Kellie Simmering # [phone]), skin barrier Kellie Simmering # [phone]) barrier ring Kellie Simmering # [phone]) Education provided: Patient not feeling well today and was not up to doing the pouch change himself. He was able to tell me the process and we reviewed frequency of pouch changes and emptying. Enrolled patient in [place] Start Discharge program: Yes previously  [place] nurse will see again next week for more teaching prior to discharge.  Cathlean Marseilles Tamala Julian, MSN, RN, [place], Lysle Pearl, [facility] Associate Pager [phone]

## 2022-08-29 NOTE — Progress Notes (Addendum)
13 Days Post-Op  Subjective: CC: Pain over left abdomen improved today. Did not eat much yesterday. Nausea resolved. No vomiting. Tolerating cld this am. Reports small amount of liquid brown stool after suppository in ostomy yesterday. No further gas/output. Did not get oob yesterday.   Objective: Vital signs in last 24 hours: Temp:  [98.3 F (36.8 C)-99.2 F (37.3 C)] 98.3 F (36.8 C) (09/28 0821) Pulse Rate:  [76-107] 107 (09/28 0821) Resp:  [14-17] 17 (09/28 0821) BP: (118-145)/(71-86) 119/77 (09/28 0821) SpO2:  [96 %-98 %] 96 % (09/28 0821) Weight:  [50.5 kg] 50.5 kg (09/28 0500) Last BM Date : 08/27/22  Intake/Output from previous day: 09/27 0701 - 09/28 0700 In: 1035.2 [I.V.:787.5; IV Piggyback:227.7] Out: 0737 [Urine:1450; Drains:75] Intake/Output this shift: Total I/O In: -  Out: 375 [Urine:275; Stool:100]  PE: General: pleasant, WD, male who is laying in bed in NAD Lungs: Respiratory effort nonlabored Abd: Soft, maybe some mild distension, some L sided ttp without rigidity or guarding, colostomy with - no gas or stool. Stoma pink and viable. Surgical JP drain with no output recorded or really in bulb - discussed with MD, drain removed. IR drain with bloody purulent output, 75cc/24 hours. Midline wound stable separation, some granulation tissue overlying and less fibrinous exudate compared to prior.  No obvious evisceration. Stable from picture yesterday.   Lab Results:  Recent Labs    08/28/22 0307  WBC 13.7*  HGB 10.2*  HCT 31.6*  PLT 558*   BMET Recent Labs    08/28/22 0307 08/29/22 0308  NA 137 140  K 4.2 4.0  CL 103 106  CO2 26 25  GLUCOSE 126* 130*  BUN 11 13  CREATININE 0.50* 0.49*  CALCIUM 9.0 8.6*   PT/INR No results for input(s): "LABPROT", "INR" in the last 72 hours. CMP     Component Value Date/Time   NA 140 08/29/2022 0308   NA 140 10/12/2020 1526   K 4.0 08/29/2022 0308   CL 106 08/29/2022 0308   CO2 25 08/29/2022 0308    GLUCOSE 130 (H) 08/29/2022 0308   BUN 13 08/29/2022 0308   BUN 10 10/12/2020 1526   CREATININE 0.49 (L) 08/29/2022 0308   CREATININE 0.77 01/27/2013 1105   CALCIUM 8.6 (L) 08/29/2022 0308   PROT 7.3 08/29/2022 0308   PROT 7.5 10/12/2020 1526   ALBUMIN 2.2 (L) 08/29/2022 0308   ALBUMIN 4.5 10/12/2020 1526   AST 19 08/29/2022 0308   ALT 14 08/29/2022 0308   ALKPHOS 76 08/29/2022 0308   BILITOT 0.3 08/29/2022 0308   BILITOT 0.3 10/12/2020 1526   GFRNONAA >60 08/29/2022 0308   GFRAA 123 10/12/2020 1526   Lipase     Component Value Date/Time   LIPASE 39 08/16/2022 0506    Studies/Results: No results found.  Anti-infectives: Anti-infectives (From admission, onward)    Start     Dose/Rate Route Frequency Ordered Stop   08/27/22 1300  Ampicillin-Sulbactam (UNASYN) 3 g in sodium chloride 0.9 % 100 mL IVPB        3 g 200 mL/hr over 30 Minutes Intravenous Every 8 hours 08/27/22 1202 08/29/22 0447   08/17/22 1630  piperacillin-tazobactam (ZOSYN) IVPB 3.375 g  Status:  Discontinued        3.375 g 12.5 mL/hr over 240 Minutes Intravenous Every 8 hours 08/17/22 1540 08/27/22 1202   08/16/22 1500  piperacillin-tazobactam (ZOSYN) IVPB 3.375 g        3.375 g 100 mL/hr over 30 Minutes  Intravenous STAT 08/16/22 1446 08/16/22 2032   08/16/22 0615  piperacillin-tazobactam (ZOSYN) IVPB 3.375 g        3.375 g 100 mL/hr over 30 Minutes Intravenous  Once 08/16/22 0609 08/16/22 0713        Assessment/Plan POD 13 s/p ex lap partial sigmoid colectomy, colostomy by Dr. Oda Cogan on 9/15 for Perforated stercoral ulcer - History of chronic pain on suboxone. Off PCA. Maximize PO pain control with IV PRN - Cont TPN - started 9/19 pm. FLD. AROBF. Stoma viable. Patent on digitization 9/27 w some hard stool noted. Increase bowel regimen.  - CT w/ extraperitoneal fluid collection in LLQ lateral to psoas. S/p IR drain 9/23. Cx with E. Coli. Narrowed to Unasyn. Comp abx 5d after drainage (through 9/28).  -  Surgical JP drain out 9/28 - Mepitel at base of midline with dry to dry dressings over bid - WOC RN following for new ostomy - Encourage ambulation and IS use   FEN - FLD, IVFs, TPN VTE - SCDs, Lovenox ID - zosyn 9/14 - 9/26. Unasyn 9/26 - 9/28. WBC pending.  Foley - out 9/18. voiding   Per primary Tobacco abuse/COPD - 2 ppd H/O substance abuse H/O opioid dependence - now on suboxone prn H/O seizure d/o Chronic pancreatitis Zollinger-Ellison syndrome GERD Hypoalbuminemic - appears malnourished at baseline due to muscle wasting RA - mtx and enbrel held   LOS: 13 days    Jillyn Ledger , [facility] 08/29/2022, 9:27 AM Please see Amion for pager number during day hours 7:00am-4:30pm

## 2022-08-29 NOTE — Progress Notes (Signed)
[facility]                                                                              Jerry Knight, is a 61 y.o. male, DOB - 1961/09/08, BSJ:[id number] Admit date - 08/16/2022    Outpatient Primary MD for the patient is Irwin Brakeman [name], [place]  LOS - 13  days  Chief Complaint  Patient presents with   Abdominal Pain       Brief summary   Jerry Knight is a 61 y.o. male with medical history significant of  COPD, chronic pancreatitis, rheumatoid arthritis on Enbrel, history of seizures not on an AED, chronic pain on Suboxone, polysubstance abuse, Zollinger-Ellison syndrome, and constipation who presented with complaints of  lower abdominal pain.  Imaging remarkable for colonic perforation/obstruction  Patient was admitted for further work-up   Assessment & Plan    Principal Problem: Sepsis secondary to bowel perforation ([place]) due to stercoral ulcer, left  retroperitoneal abscess -CT revealed colonic perforation in the region of the sigmoid colon thought secondary to impacted stool.  -Met sepsis criteria on admission with tachycardia, tachypnea, leukocytosis, lactic acidosis -Surgery consulted, underwent emergent exploratory laparotomy, left colostomy and partial sigmoid colectomy on 9/15 -Repeat CT on 9/22 with new 7 cm fluid collection in the LLQ s/p drain placement by IR on 9/23, cultures showing abundant E. Coli -Continue full liquid diet, TPN, antibiotics transitioned to IV Unasyn -Diet advanced to full liquids, continue TPN.   -9/28 small amount of liquid output from  stoma today   Acute on chronic pain  -Outpatient on Suboxone -Off the PCA now complaining of uncontrolled pain and abdominal cramps last night -Continue oral oxycodone PRN and morphine  IV PRN for breakthrough pain.  - continue tizanidine, continue Lidoderm patch as needed   Normocytic anemia -Likely anemia of chronic disease -HTN stable 10.2  Hypokalemia, hyponatremia -Continue  TPN  COPD, without exacerbation - Chest x-ray with chronic interstitial lung changes with bibasilar atelectasis. -Stable, no wheezing, continue albuterol nebs as needed   History of rheumatoid arthritis -Outpatient on Enbrel 50 mg and methotrexate 10 mg on Saturdays -Currently regimen on hold  Insomnia, anxiety -For now continue Ativan as needed for anxiety -Continue trazodone 150 mg at bedtime    GERD -Continue PPI    Tobacco abuse Patient reportedly smokes 2 packs of cigarettes per day on average. - cont Nicotine patch  Moderate to severe protein calorie malnutrition due to acute illness, hypoalbuminemia 1.9, muscle wasting Estimated body mass index is 17.99 kg/m as calculated from the following:   Height as of this encounter: 5' 5.5" (1.664 m).   Weight as of this encounter: 49.8 kg. -Currently on TPN  Code Status: Full code DVT Prophylaxis:  enoxaparin (LOVENOX) injection 40 mg Start: 08/25/22 1000 SCD's Start: 08/16/22 1754 SCDs Start: 08/16/22 1600   Level of Care: Level of care: Telemetry Surgical Family Communication:  wife updated at the bedside on 9/25, patient alert and oriented.  Disposition Plan:       Not stable for discharge until cleared by general surgery   Procedures:  9/15 : ex lap partial sigmoid colectomy,  colostomy  9/23: Drain into left retroperitoneal abscess with 140 mL purulent fluid aspirated  Consultants:   General surgery Interventional radiology  Antimicrobials:   Anti-infectives (From admission, onward)    Start     Dose/Rate Route Frequency Ordered Stop   08/27/22 1300  Ampicillin-Sulbactam (UNASYN) 3 g in sodium chloride 0.9 % 100 mL IVPB        3 g 200 mL/hr over 30 Minutes Intravenous Every 8 hours 08/27/22 1202 08/29/22 0447   08/17/22 1630  piperacillin-tazobactam (ZOSYN) IVPB 3.375 g  Status:  Discontinued        3.375 g 12.5 mL/hr over 240 Minutes Intravenous Every 8 hours 08/17/22 1540 08/27/22 1202   08/16/22 1500   piperacillin-tazobactam (ZOSYN) IVPB 3.375 g        3.375 g 100 mL/hr over 30 Minutes Intravenous STAT 08/16/22 1446 08/16/22 2032   08/16/22 0615  piperacillin-tazobactam (ZOSYN) IVPB 3.375 g        3.375 g 100 mL/hr over 30 Minutes Intravenous  Once 08/16/22 0609 08/16/22 0713          Medications  acetaminophen  1,000 mg Oral Q6H   [START ON 08/30/2022] atorvastatin  20 mg Oral Daily   Chlorhexidine Gluconate Cloth  6 each Topical Daily   docusate sodium  100 mg Oral BID   enoxaparin (LOVENOX) injection  40 mg Subcutaneous Q24H   feeding supplement  237 mL Oral TID BM   lidocaine  1 patch Transdermal Q24H   nicotine  21 mg Transdermal QHS   [START ON 08/30/2022] pantoprazole  40 mg Oral Daily   polyethylene glycol  17 g Oral BID   sodium chloride flush  3 mL Intravenous Q12H   sodium chloride flush  5 mL Intracatheter Q8H   tiZANidine  2 mg Oral Q8H   traZODone  150 mg Oral QHS      Subjective:   Jerry Knight was seen and examined today.   Reports walked  this am, still c/o ab pain, does not want to eat,   Small amount of liquid output in ostomy.   Tolerating TPN  Objective:   Vitals:   08/29/22 0429 08/29/22 0500 08/29/22 0821 08/29/22 1536  BP: (!) 145/81  119/77 (!) 142/79  Pulse: (!) 103  (!) 107 (!) 101  Resp: 15  17 17   Temp: 98.4 F (36.9 C)  98.3 F (36.8 C) 98.2 F (36.8 C)  TempSrc: Oral  Oral Oral  SpO2: 97%  96% 97%  Weight:  50.5 kg 49.8 kg   Height:        Intake/Output Summary (Last 24 hours) at 08/29/2022 1919 Last data filed at 08/29/2022 1854 Gross per 24 hour  Intake 1845.53 ml  Output 1850 ml  Net -4.47 ml     Wt Readings from Last 3 Encounters:  08/29/22 49.8 kg  07/27/22 53.5 kg  11/09/21 52.2 kg   Physical Exam General: Alert and oriented x 3, NAD, thin, malnourished  Cardiovascular: S1 S2 clear, RRR.  Respiratory: CTAB, no wheezing Gastrointestinal: Soft, stoma pink and viable, dressing intact midline, small liquid output  in ostomy Ext: no pedal edema bilaterally Neuro: no new deficits Psych: calm and cooperative today  Data Reviewed:  I have personally reviewed following labs    CBC Lab Results  Component Value Date   WBC 19.3 (H) 08/29/2022   RBC 3.19 (L) 08/29/2022   HGB 9.7 (L) 08/29/2022   HCT 30.3 (L) 08/29/2022   MCV 95.0 08/29/2022  MCH 30.4 08/29/2022   PLT 633 (H) 08/29/2022   MCHC 32.0 08/29/2022   RDW 14.4 08/29/2022   LYMPHSABS 1.1 08/16/2022   MONOABS 1.4 (H) 08/16/2022   EOSABS 0.0 08/16/2022   BASOSABS 0.0 28/20/8138     Last metabolic panel Lab Results  Component Value Date   NA 140 08/29/2022   K 4.0 08/29/2022   CL 106 08/29/2022   CO2 25 08/29/2022   BUN 13 08/29/2022   CREATININE 0.49 (L) 08/29/2022   GLUCOSE 130 (H) 08/29/2022   GFRNONAA >60 08/29/2022   GFRAA 123 10/12/2020   CALCIUM 8.6 (L) 08/29/2022   PHOS 3.0 08/29/2022   PROT 7.3 08/29/2022   ALBUMIN 2.2 (L) 08/29/2022   LABGLOB 3.0 10/12/2020   AGRATIO 1.5 10/12/2020   BILITOT 0.3 08/29/2022   ALKPHOS 76 08/29/2022   AST 19 08/29/2022   ALT 14 08/29/2022   ANIONGAP 9 08/29/2022    CBG (last 3)  Recent Labs    08/29/22 0012 08/29/22 0607 08/29/22 1147  GLUCAP 150* 129* 127*      Coagulation Profile: Recent Labs  Lab 08/24/22 1415  INR 1.1     Radiology Studies: I have personally reviewed the imaging studies  No results found.     Florencia Reasons M.D. PhD FACP [facility] 08/29/2022, 7:19 PM  Available via Epic secure chat 7am-7pm After 7 pm, please refer to night coverage provider listed on amion.

## 2022-08-29 NOTE — Progress Notes (Signed)
Referring Physician(s): Dr Ayesha Rumpf  Supervising Physician: Aletta Edouard  Patient Status:  [facility] - In-pt  Chief Complaint:  Abd pain, colonic perforation/obstruction/stercoral ulcer s/p ex lap and colostomy on 08/16/22, developed left retroperitoneal gas/fluid collection  S/p aspiration and drain placement on 08/24/22 by Dr. Laurence Ferrari   Subjective:   Patient lying in bed watching TV at time of exam. He states that he is feeling ok, that he is still having mild abdominal pain but that he does not have nausea or vomiting at this time. Patient was curious about having the drain removed, but he was advised as there is still significant output at this time, that it is recommended that the drain stay in place.  Allergies: Ambien [zolpidem tartrate], Iohexol, Lunesta [eszopiclone], Seroquel [quetiapine fumarate], and Sonata [zaleplon]  Medications: Prior to Admission medications   Medication Sig Start Date End Date Taking? Authorizing Provider  amitriptyline (ELAVIL) 50 MG tablet Take 100 mg by mouth at bedtime.   Yes [provider]  atorvastatin (LIPITOR) 20 MG tablet Take 1 tablet (20 mg total) by mouth daily. 02/19/21  Yes Just, Laurita Quint, FNP  buprenorphine-naloxone (SUBOXONE) 8-2 mg SUBL SL tablet Place 1 tablet under the tongue daily.   Yes [provider]  cyclobenzaprine (FLEXERIL) 10 MG tablet Take 1 tablet (10 mg total) by mouth 3 (three) times daily. 02/19/21  Yes Just, Laurita Quint, FNP  etanercept (ENBREL) 50 MG/ML injection Inject 50 mg into the skin once a week.   Yes [provider]  folic acid (FOLVITE) 1 MG tablet Take 1 mg by mouth daily. 08/06/22  Yes [provider]  linaclotide Rolan Lipa) 145 MCG CAPS capsule Take 1 capsule (145 mcg total) by mouth daily before breakfast. 02/19/21  Yes Just, Laurita Quint, FNP  LORazepam (ATIVAN) 1 MG tablet Take 1 mg by mouth 3 (three) times daily. 07/18/22  Yes [provider]  methotrexate (RHEUMATREX)  2.5 MG tablet Take 10 mg by mouth once a week. 06/08/22  Yes [provider]  mirtazapine (REMERON) 45 MG tablet Take 1 tablet (45 mg total) by mouth at bedtime. 02/19/21  Yes Just, Laurita Quint, FNP  omeprazole (PRILOSEC) 20 MG capsule TAKE 1 CAPSULE(20 MG) BY MOUTH DAILY 02/19/21  Yes Just, Laurita Quint, FNP  traZODone (DESYREL) 50 MG tablet Take 3-4 tablets (150-200 mg total) by mouth at bedtime. 02/19/21  Yes Just, Laurita Quint, FNP     Vital Signs: BP 119/77 (BP Location: Left Arm)   Pulse (!) 107   Temp 98.3 F (36.8 C) (Oral)   Resp 17   Ht 5' 5.5" (1.664 m)   Wt 109 lb 12.6 oz (49.8 kg)   SpO2 96%   BMI 17.99 kg/m   Physical Exam Vitals reviewed.  Constitutional:      General: He is not in acute distress. Pulmonary:     Effort: Pulmonary effort is normal.  Abdominal:     General: Abdomen is flat.  Skin:    General: Skin is warm and dry.     Comments: LLQ drain in place. ~25cc of pink purulent output in JP bulb at time of exam. Drain flushes/aspirates easily. Skin with no erythema, edema, bleeding or drainage noted. Dressing is clean, dry, and intact.  Neurological:     Mental Status: He is alert.  Psychiatric:        Behavior: Behavior normal.     Imaging: No results found.  Labs:  CBC: Recent Labs    08/24/22  5188 08/25/22 0325 08/26/22 0319 08/28/22 0307  WBC 21.1* 15.2* 15.3* 13.7*  HGB 9.0* 9.1* 9.5* 10.2*  HCT 27.2* 27.1* 30.1* 31.6*  PLT 328 380 457* 558*    COAGS: Recent Labs    08/24/22 1415  INR 1.1     BMP: Recent Labs    08/25/22 0325 08/26/22 0319 08/28/22 0307 08/29/22 0308  NA 135 135 137 140  K 4.4 4.1 4.2 4.0  CL 102 102 103 106  CO2 '26 24 26 25  '$ GLUCOSE 195* 122* 126* 130*  BUN '9 10 11 13  '$ CALCIUM 8.2* 8.6* 9.0 8.6*  CREATININE 0.54* 0.52* 0.50* 0.49*  GFRNONAA >60 >60 >60 >60     LIVER FUNCTION TESTS: Recent Labs    08/22/22 0418 08/25/22 0325 08/26/22 0319 08/29/22 0308  BILITOT 0.6 0.4 0.5 0.3  AST '15 16 18  19  '$ ALT '10 12 13 14  '$ ALKPHOS 47 74 82 76  PROT 5.6* 6.3* 7.0 7.3  ALBUMIN 1.9* 1.8* 2.0* 2.2*     Assessment and Plan:  61 y.o. 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 Laurence Ferrari.    Drain Location: LLQ Size: Fr size: 12 Fr Date of placement: 08/24/22   Currently to: Drain collection device: suction bulb 24 hour output:  Output by Drain (mL) 08/27/22 0701 - 08/27/22 1900 08/27/22 1901 - 08/28/22 0700 08/28/22 0701 - 08/28/22 1900 08/28/22 1901 - 08/29/22 0700 08/29/22 0701 - 08/29/22 1314  Closed System Drain 1 Right Abdomen Bulb (JP) 19 Fr. 0 0 0 0   Closed System Drain 3 Lateral;Left LLQ Bulb (JP) 12 Fr. 60 10 50 25     Interval imaging/drain manipulation:  None   Current examination: Flushes/aspirates easily.  Insertion site unremarkable. Suture and stat lock in place. Dressed appropriately.   Plan: Continue TID flushes with 5 cc NS. Record output Q shift. Dressing changes QD or PRN if soiled.  Call IR APP or on call IR MD if difficulty flushing or sudden change in drain output.  Repeat imaging/possible drain injection once output < 10 mL/QD (excluding flush material). Consideration for drain removal if output is < 10 mL/QD (excluding flush material), pending discussion with the providing surgical service.  Discharge planning: Please contact IR APP or on call IR MD prior to patient d/c to ensure appropriate follow up plans are in place. Typically patient will follow up with IR clinic 10-14 days post d/c for repeat imaging/possible drain injection. IR scheduler will contact patient with date/time of appointment. Patient will need to flush drain QD with 5 cc NS, record output QD, dressing changes every 2-3 days or earlier if soiled.   IR will continue to follow - please call with questions or  concerns.   Electronically Signed: Lura Em, PA-C 08/29/2022, 1:14 PM   I spent a total of 15 Minutes at the the patient's bedside AND on the patient's hospital floor or unit, greater than 50% of which was counseling/coordinating care for LLQ drain placement.

## 2022-08-29 NOTE — Progress Notes (Signed)
Physical Therapy Treatment Patient Details Name: Jerry Knight MRN: [id number] DOB: [birth year] Today's Date: 08/29/2022   History of Present Illness Pt is a 61 y/o male admitted secondary to bowel perforation. Pt is s/p emergent exploratory laparotomy, left colostomy and partial sigmoid colectomy on 9/15. PMH includes RA, COPD, and seizures.    PT Comments    Pt was seen for mobility on SPC and IV pole, with pt moving both without assist and min guard just for safety.  Requires a bit of extra time and demonstrates minor fatigue and discomfort, but also is recommended to consider a RW for support of the surgery.  Follow acutely and will not recommend PT to follow up as pt is walking longer trips with good control of balance on AD's.     Recommendations for follow up therapy are one component of a multi-disciplinary discharge planning process, led by the attending physician.  Recommendations may be updated based on patient status, additional functional criteria and insurance authorization.  Follow Up Recommendations  No PT follow up     Assistance Recommended at Discharge PRN  Patient can return home with the following A little help with bathing/dressing/bathroom;Assistance with cooking/housework;Assist for transportation;Help with stairs or ramp for entrance   Equipment Recommendations  None recommended by PT    Recommendations for Other Services       Precautions / Restrictions Precautions Precautions: Fall Precaution Comments: abd precautions Restrictions Weight Bearing Restrictions: No     Mobility  Bed Mobility Overal bed mobility: Modified Independent                  Transfers Overall transfer level: Modified independent Equipment used: None Transfers: Sit to/from Stand Sit to Stand: Modified independent (Device/Increase time)           General transfer comment: supervision for safety    Ambulation/Gait Ambulation/Gait assistance: Min guard Gait  Distance (Feet): 300 Feet Assistive device: IV Pole, Straight cane Gait Pattern/deviations: Step-through pattern, Decreased stride length, Trunk flexed (mildly) Gait velocity: decreased Gait velocity interpretation: <1.31 ft/sec, indicative of household ambulator   General Gait Details: cane and IV for support but could also consider a RW for pain and postural support   Stairs             Wheelchair Mobility    Modified Rankin (Stroke Patients Only)       Balance Overall balance assessment: Needs assistance Sitting-balance support: Feet supported Sitting balance-Leahy Scale: Good     Standing balance support: Bilateral upper extremity supported, During functional activity Standing balance-Leahy Scale: Fair                              Cognition Arousal/Alertness: Awake/alert Behavior During Therapy: WFL for tasks assessed/performed Overall Cognitive Status: Within Functional Limits for tasks assessed                                          Exercises      General Comments General comments (skin integrity, edema, etc.): pt is using two support devices due to his SPC being too long for his height, but pt made the cane and prefers to use it      Pertinent Vitals/Pain Pain Assessment Pain Assessment: Faces Faces Pain Scale: Hurts little more Pain Location: abdomen Pain Descriptors / Indicators: Discomfort Pain Intervention(s): Limited activity  within patient's tolerance, Monitored during session, Premedicated before session, Repositioned    Home Living Family/patient expects to be discharged to:: Private residence                        Prior Function            PT Goals (current goals can now be found in the care plan section) Progress towards PT goals: Progressing toward goals    Frequency    Min 3X/week      PT Plan Current plan remains appropriate    Co-evaluation              AM-PAC PT "6  Clicks" Mobility   Outcome Measure  Help needed turning from your back to your side while in a flat bed without using bedrails?: None Help needed moving from lying on your back to sitting on the side of a flat bed without using bedrails?: A Little Help needed moving to and from a bed to a chair (including a wheelchair)?: A Little Help needed standing up from a chair using your arms (e.g., wheelchair or bedside chair)?: A Little Help needed to walk in hospital room?: A Little Help needed climbing 3-5 steps with a railing? : A Little 6 Click Score: 19    End of Session Equipment Utilized During Treatment: Other (comment) (no Belt) Activity Tolerance: Patient tolerated treatment well Patient left: with call bell/phone within reach;in bed;with nursing/sitter in room Nurse Communication: Mobility status PT Visit Diagnosis: Other abnormalities of gait and mobility (R26.89)     Time: 4656-8127 PT Time Calculation (min) (ACUTE ONLY): 28 min  Charges:  $Gait Training: 8-22 mins $Therapeutic Activity: 8-22 mins      Ramond Dial 08/29/2022, 3:14 PM  Mee Hives, PT PhD Acute Rehab Dept. Number: [place] [facility] [place]

## 2022-08-29 NOTE — Progress Notes (Signed)
PHARMACY - TOTAL PARENTERAL NUTRITION CONSULT NOTE   Indication: Prolonged ileus  Patient Measurements: Height: 5' 5.5" (166.4 cm) Weight: 50.5 kg (111 lb 5.3 oz) IBW/kg (Calculated) : 62.65 TPN AdjBW (KG): 52.6 Body mass index is 18.24 kg/m.  Usual Weight: 115 lbs  Assessment:  61 yo male with a history of chronic pain syndrome on Suboxone, chronic pancreatitis with prior surgery for pseudocyst removal in the 1990s, prior BII in the 1980s, COPD, rheumatoid arthritis on Enbrel, history of seizures not on an AED, and Zollinger-Ellison syndrome who presented with acute onset of abdominal pain along with N/V. He was found to have a WBC of 22K, lactic acid of 3.1 and a CT scan concerning for a colonic perforation. Pharmacy consulted to initiate TPN for prolonged ileus.  Glucose / Insulin: no hx DM, CBGs controlled   Electrolytes: Na 140, K 4.0, Cl 106, CoCa 10, phos 3.0, Mg 1.9. Other electrolytes wnl.  Renal: Scr 0.49 (bsl < 1); BUN wnl Hepatic: LFTs wnl. Albumin 2.2. TG 107  Intake / Output; MIVF: UOP 1.2 ml/kg/hr; R JP drain 0 ml. L JP drain 75 ml. 0 ml stool.   GI Imaging: 9/15 colonic perforation in the region of the sigmoid colon likely secondary to impacted stool GI Surgeries / Procedures:  9/15 Exploratory laparotomy, Left colostomy and partial sigmoid colectomy  9/23: drain placed for L retroperitoneal abscess (11m fluid aspirated)  Central access: 08/20/22 TPN start date:  08/20/22  Nutritional Goals: Goal TPN rate is 70 mL/hr (provides 87 g of protein and 1703 kcals per day)  RD Assessment: Estimated Needs Total Energy Estimated Needs: 1700-1900 Total Protein Estimated Needs: 85-100 grams Total Fluid Estimated Needs: >/= 1.7 L   Current Nutrition:  9/19 NPO 9/24 CLD 9/25 FLD  Plan:  Continue TPN at goal rate of 745mhr to provide 87g AA and 1703 kcal Electrolytes in TPN:  Continue Na 150 mEq/L, K 45 mEq/L, Ca 2 mEq/L (decreased 9/27), Mg 10 mEq/L, phos 18 mmol/L  (increased 9/27). Continue Cl:Ac 1:1. No changes today Add standard MVI and trace elements to TPN Monitor CBGs - continue off SSI since CBGs controlled Monitor TPN labs on Mon/Thurs, as needed Follow up toleration and advancement of diet -  FLD  Thank you for allowing pharmacy to be a part of this patient's care.  GrCristela FeltPharmD, BCPS Clinical Pharmacist 08/29/2022 7:57 AM

## 2022-08-30 ENCOUNTER — Inpatient Hospital Stay (HOSPITAL_COMMUNITY): Payer: Medicare HMO

## 2022-08-30 DIAGNOSIS — K631 Perforation of intestine (nontraumatic): Secondary | ICD-10-CM | POA: Diagnosis not present

## 2022-08-30 LAB — CBC
HCT: 30.6 % — ABNORMAL LOW (ref 39.0–52.0)
Hemoglobin: 10.2 g/dL — ABNORMAL LOW (ref 13.0–17.0)
MCH: 30.8 pg (ref 26.0–34.0)
MCHC: 33.3 g/dL (ref 30.0–36.0)
MCV: 92.4 fL (ref 80.0–100.0)
Platelets: 598 10*3/uL — ABNORMAL HIGH (ref 150–400)
RBC: 3.31 MIL/uL — ABNORMAL LOW (ref 4.22–5.81)
RDW: 14.4 % (ref 11.5–15.5)
WBC: 15.3 10*3/uL — ABNORMAL HIGH (ref 4.0–10.5)
nRBC: 0 % (ref 0.0–0.2)

## 2022-08-30 LAB — BASIC METABOLIC PANEL
Anion gap: 8 (ref 5–15)
BUN: 11 mg/dL (ref 6–20)
CO2: 23 mmol/L (ref 22–32)
Calcium: 8.7 mg/dL — ABNORMAL LOW (ref 8.9–10.3)
Chloride: 105 mmol/L (ref 98–111)
Creatinine, Ser: 0.43 mg/dL — ABNORMAL LOW (ref 0.61–1.24)
GFR, Estimated: >60 mL/min (ref >60)
Glucose, Bld: 134 mg/dL — ABNORMAL HIGH (ref 70–99)
Potassium: 3.9 mmol/L (ref 3.5–5.1)
Sodium: 136 mmol/L (ref 135–145)

## 2022-08-30 LAB — GLUCOSE, CAPILLARY
Glucose-Capillary: 107 mg/dL — ABNORMAL HIGH (ref 70–99)
Glucose-Capillary: 117 mg/dL — ABNORMAL HIGH (ref 70–99)
Glucose-Capillary: 122 mg/dL — ABNORMAL HIGH (ref 70–99)
Glucose-Capillary: 122 mg/dL — ABNORMAL HIGH (ref 70–99)
Glucose-Capillary: 125 mg/dL — ABNORMAL HIGH (ref 70–99)
Glucose-Capillary: 138 mg/dL — ABNORMAL HIGH (ref 70–99)

## 2022-08-30 LAB — PHOSPHORUS: Phosphorus: 3.4 mg/dL (ref 2.5–4.6)

## 2022-08-30 MED ORDER — TRAVASOL 10 % IV SOLN
INTRAVENOUS | Status: AC
  Filled 2022-08-30: qty 998.4

## 2022-08-30 MED ORDER — SODIUM CHLORIDE 0.9 % IV SOLN
12.5000 mg | Freq: Three times a day (TID) | INTRAVENOUS | Status: DC | PRN
  Administered 2022-08-30 – 2022-09-09 (×24): 12.5 mg via INTRAVENOUS
  Filled 2022-08-30 (×31): qty 0.5

## 2022-08-30 MED ORDER — TRAVASOL 10 % IV SOLN
INTRAVENOUS | Status: DC
  Filled 2022-08-30: qty 873.6

## 2022-08-30 MED ORDER — SODIUM CHLORIDE 0.9 % IV SOLN
12.5000 mg | Freq: Once | INTRAVENOUS | Status: AC
  Administered 2022-08-30: 12.5 mg via INTRAVENOUS
  Filled 2022-08-30: qty 0.5

## 2022-08-30 NOTE — Progress Notes (Addendum)
14 Days Post-Op  Subjective: CC: Patient reports increased hiccups and crampy/gas pains overnight. Pain wax/wanes, is generalized, more on the left abdomen. Some nausea. Taking in only gingerale. No vomiting. Apparently got some thorazine last night that helped. 125cc documented from ostomy bag but he reports small amount of sweat in bag is only output since I saw him yesterday. RN reports she has not no emptied this either.  He reports more gas/air from ostomy. Had to burp it x3 over the last 24 hours. Mobilized with PT. Voiding.   Objective: Vital signs in last 24 hours: Temp:  [98.2 F (36.8 C)-98.8 F (37.1 C)] 98.8 F (37.1 C) (09/29 0817) Pulse Rate:  [96-105] 98 (09/29 0817) Resp:  [15-17] 17 (09/29 0817) BP: (126-144)/(79-88) 126/79 (09/29 0817) SpO2:  [97 %-98 %] 97 % (09/29 0817) Last BM Date : 08/29/22  Intake/Output from previous day: 09/28 0701 - 09/29 0700 In: 2739 [P.O.:240; I.V.:2474; IV Piggyback:25] Out: 3762 [Urine:1225; Drains:15; Stool:125] Intake/Output this shift: No intake/output data recorded.  PE: General: pleasant, WD, male who is laying in bed in NAD Lungs: Respiratory effort nonlabored Abd: Soft, maybe some mild distension, some L>R sided ttp without rigidity or guarding. Colostomy with small amount of air and sweat in bag. Stoma pink and viable. Surgical JP drain site with dressing in place, cdi. IR drain with dark bloody purulent output, 15cc/24 hours. Midline wound with separation as noted on prior pictures/exam. Most of the wound with layer of granulation tissue overlying and less fibrinous exudate compared to prior - I do have concern about an area about 1/3 up from bottom of the wound slightly on the left. Will have my attending review/eval wound.   Update - Discussed/reviewed midline wound with attending - continue Mepitel at base of midline with wtd dressings over bid for now and will get CT A/P to better evaluate.    Lab Results:  Recent  Labs    08/29/22 1211 08/30/22 0440  WBC 19.3* 15.3*  HGB 9.7* 10.2*  HCT 30.3* 30.6*  PLT 633* 598*   BMET Recent Labs    08/29/22 0308 08/30/22 0440  NA 140 136  K 4.0 3.9  CL 106 105  CO2 25 23  GLUCOSE 130* 134*  BUN 13 11  CREATININE 0.49* 0.43*  CALCIUM 8.6* 8.7*   PT/INR No results for input(s): "LABPROT", "INR" in the last 72 hours. CMP     Component Value Date/Time   NA 136 08/30/2022 0440   NA 140 10/12/2020 1526   K 3.9 08/30/2022 0440   CL 105 08/30/2022 0440   CO2 23 08/30/2022 0440   GLUCOSE 134 (H) 08/30/2022 0440   BUN 11 08/30/2022 0440   BUN 10 10/12/2020 1526   CREATININE 0.43 (L) 08/30/2022 0440   CREATININE 0.77 01/27/2013 1105   CALCIUM 8.7 (L) 08/30/2022 0440   PROT 7.3 08/29/2022 0308   PROT 7.5 10/12/2020 1526   ALBUMIN 2.2 (L) 08/29/2022 0308   ALBUMIN 4.5 10/12/2020 1526   AST 19 08/29/2022 0308   ALT 14 08/29/2022 0308   ALKPHOS 76 08/29/2022 0308   BILITOT 0.3 08/29/2022 0308   BILITOT 0.3 10/12/2020 1526   GFRNONAA >60 08/30/2022 0440   GFRAA 123 10/12/2020 1526   Lipase     Component Value Date/Time   LIPASE 39 08/16/2022 0506    Studies/Results: No results found.  Anti-infectives: Anti-infectives (From admission, onward)    Start     Dose/Rate Route Frequency Ordered Stop  08/27/22 1300  Ampicillin-Sulbactam (UNASYN) 3 g in sodium chloride 0.9 % 100 mL IVPB        3 g 200 mL/hr over 30 Minutes Intravenous Every 8 hours 08/27/22 1202 08/29/22 0447   08/17/22 1630  piperacillin-tazobactam (ZOSYN) IVPB 3.375 g  Status:  Discontinued        3.375 g 12.5 mL/hr over 240 Minutes Intravenous Every 8 hours 08/17/22 1540 08/27/22 1202   08/16/22 1500  piperacillin-tazobactam (ZOSYN) IVPB 3.375 g        3.375 g 100 mL/hr over 30 Minutes Intravenous STAT 08/16/22 1446 08/16/22 2032   08/16/22 0615  piperacillin-tazobactam (ZOSYN) IVPB 3.375 g        3.375 g 100 mL/hr over 30 Minutes Intravenous  Once 08/16/22 0609  08/16/22 0713        Assessment/Plan POD 14 s/p ex lap partial sigmoid colectomy, colostomy by Dr. Oda Cogan on 9/15 for Perforated stercoral ulcer - History of chronic pain on suboxone. Off PCA.  - Cont TPN - started 9/19 pm. Was on FLD currently and not taking in much. Output documented on I/O but no real stool output noted. He is having more gas. Stoma viable. Patent on digitization 9/27 w some hard stool noted. On a bowel regimen - CT w/ extraperitoneal fluid collection in LLQ lateral to psoas. S/p IR drain 9/23. Cx with E. Coli. Narrowed to Unasyn. Comp abx 5d after drainage (through 9/28).  - Surgical JP drain out 9/28 - Continue Mepitel at base of midline with wtd dressings over bid as we have been doing (order changed to Mepitel at base with wtd over BID 9/25). We are discussing with wocn to see if there is a Hydrojel that would also assist with wound care below the Mepitel and then doing wtd over this to help keep the wound moist.  - WOC RN following for new ostomy - Encourage ambulation and IS use - CT A/P today    FEN - NPO for CT, IVFs, TPN VTE - SCDs, Lovenox ID - zosyn 9/14 - 9/26. Unasyn 9/26 - 9/28. None currently. WBC 15.3 (19.3) Foley - out 9/18. voiding   Per primary Tobacco abuse/COPD - 2 ppd H/O substance abuse H/O opioid dependence - now on suboxone prn H/O seizure d/o Chronic pancreatitis Zollinger-Ellison syndrome GERD Hypoalbuminemic - appears malnourished at baseline due to muscle wasting RA - mtx and enbrel held   LOS: 14 days    Jillyn Ledger , [facility] 08/30/2022, 8:59 AM Please see Amion for pager number during day hours 7:00am-4:30pm

## 2022-08-30 NOTE — Progress Notes (Signed)
Physical Therapy Treatment Patient Details Name: Jerry Knight MRN: [id number] DOB: 1961/01/13 Today's Date: 08/30/2022   History of Present Illness Pt is a 61 y/o male admitted secondary to bowel perforation. Pt is s/p emergent exploratory laparotomy, left colostomy and partial sigmoid colectomy on 9/15. PMH includes RA, COPD, and seizures.    PT Comments    Patient agreeable to mobilize with PT with encouragement. He reports having hiccups today, however not noticeable with mobility. Patient ambulated in hallway with cane and IV pole for support. Could likely benefit from rolling walker with ambulation, however has not wanted to try. The patient has mild dyspnea briefly after walking. Recommend to continue PT to maximize independence in preparation for eventual discharge home.     Recommendations for follow up therapy are one component of a multi-disciplinary discharge planning process, led by the attending physician.  Recommendations may be updated based on patient status, additional functional criteria and insurance authorization.  Follow Up Recommendations  No PT follow up     Assistance Recommended at Discharge PRN  Patient can return home with the following A little help with bathing/dressing/bathroom;Assistance with cooking/housework;Assist for transportation;Help with stairs or ramp for entrance   Equipment Recommendations  None recommended by PT    Recommendations for Other Services       Precautions / Restrictions Precautions Precautions: Fall Restrictions Weight Bearing Restrictions: No     Mobility  Bed Mobility Overal bed mobility: Modified Independent             General bed mobility comments: increased time and effort    Transfers Overall transfer level: Modified independent Equipment used: None Transfers: Sit to/from Stand Sit to Stand: Modified independent (Device/Increase time)                Ambulation/Gait Ambulation/Gait assistance:  Min guard Gait Distance (Feet): 300 Feet Assistive device: IV Pole, Straight cane Gait Pattern/deviations: Step-through pattern, Trunk flexed Gait velocity: decreased     General Gait Details: encouraged patient to trial the rolling walker, however he prefers to use his own cane in the LUE and the IV pole on the right side for support with ambulation. one standing break required for coughing. mild dyspnea at the end of the walk. occasional cues required.   Stairs             Wheelchair Mobility    Modified Rankin (Stroke Patients Only)       Balance Overall balance assessment: Needs assistance Sitting-balance support: Feet supported Sitting balance-Leahy Scale: Good     Standing balance support: Bilateral upper extremity supported, During functional activity Standing balance-Leahy Scale: Fair Standing balance comment: no external support required                            Cognition Arousal/Alertness: Awake/alert Behavior During Therapy: WFL for tasks assessed/performed Overall Cognitive Status: Within Functional Limits for tasks assessed                                          Exercises      General Comments General comments (skin integrity, edema, etc.): educated patient on the importance of mobilizing to maintain strength and conditioning. recommend to be OOB routinely with staff.      Pertinent Vitals/Pain Pain Assessment Pain Assessment: Faces Faces Pain Scale: Hurts little more Pain Location: abdomen Pain  Descriptors / Indicators: Discomfort Pain Intervention(s): Limited activity within patient's tolerance, Monitored during session    Home Living                          Prior Function            PT Goals (current goals can now be found in the care plan section) Acute Rehab PT Goals Patient Stated Goal: to feel better and go home PT Goal Formulation: With patient Time For Goal Achievement:  09/05/22 Potential to Achieve Goals: Good Progress towards PT goals: Progressing toward goals    Frequency    Min 3X/week      PT Plan Current plan remains appropriate    Co-evaluation              AM-PAC PT "6 Clicks" Mobility   Outcome Measure  Help needed turning from your back to your side while in a flat bed without using bedrails?: None Help needed moving from lying on your back to sitting on the side of a flat bed without using bedrails?: A Little Help needed moving to and from a bed to a chair (including a wheelchair)?: A Little Help needed standing up from a chair using your arms (e.g., wheelchair or bedside chair)?: A Little Help needed to walk in hospital room?: A Little Help needed climbing 3-5 steps with a railing? : A Little 6 Click Score: 19    End of Session   Activity Tolerance: Patient tolerated treatment well Patient left: in bed;with call bell/phone within reach   PT Visit Diagnosis: Other abnormalities of gait and mobility (R26.89)     Time: 6754-4920 PT Time Calculation (min) (ACUTE ONLY): 21 min  Charges:  $Therapeutic Activity: 8-22 mins                     Jerry Knight, PT, MPT    Jerry Knight 08/30/2022, 12:55 PM

## 2022-08-30 NOTE — Progress Notes (Signed)
Referring Physician(s): Dr Ayesha Rumpf  Supervising Physician: Corrie Mckusick  Patient Status:  [facility] - In-Jerry Knight  Chief Complaint:  Abd pain, colonic perforation/obstruction/stercoral ulcer s/p ex lap and colostomy on 08/16/22, developed left retroperitoneal gas/fluid collection  S/p aspiration and drain placement on 08/24/22 by Dr. Laurence Ferrari   Subjective:  Jerry Knight laying in bed, appears uncomfortable.  States that he is dealing with gas and hiccup, waiting on medicine.   Allergies: Ambien [zolpidem tartrate], Iohexol, Lunesta [eszopiclone], Seroquel [quetiapine fumarate], and Sonata [zaleplon]  Medications: Prior to Admission medications   Medication Sig Start Date End Date Taking? Authorizing Provider  amitriptyline (ELAVIL) 50 MG tablet Take 100 mg by mouth at bedtime.   Yes [provider]  atorvastatin (LIPITOR) 20 MG tablet Take 1 tablet (20 mg total) by mouth daily. 02/19/21  Yes Just, Laurita Quint, FNP  buprenorphine-naloxone (SUBOXONE) 8-2 mg SUBL SL tablet Place 1 tablet under the tongue daily.   Yes [provider]  cyclobenzaprine (FLEXERIL) 10 MG tablet Take 1 tablet (10 mg total) by mouth 3 (three) times daily. 02/19/21  Yes Just, Laurita Quint, FNP  etanercept (ENBREL) 50 MG/ML injection Inject 50 mg into the skin once a week.   Yes [provider]  folic acid (FOLVITE) 1 MG tablet Take 1 mg by mouth daily. 08/06/22  Yes [provider]  linaclotide Rolan Lipa) 145 MCG CAPS capsule Take 1 capsule (145 mcg total) by mouth daily before breakfast. 02/19/21  Yes Just, Laurita Quint, FNP  LORazepam (ATIVAN) 1 MG tablet Take 1 mg by mouth 3 (three) times daily. 07/18/22  Yes [provider]  methotrexate (RHEUMATREX) 2.5 MG tablet Take 10 mg by mouth once a week. 06/08/22  Yes [provider]  mirtazapine (REMERON) 45 MG tablet Take 1 tablet (45 mg total) by mouth at bedtime. 02/19/21  Yes Just, Laurita Quint, FNP  omeprazole (PRILOSEC) 20 MG capsule TAKE 1  CAPSULE(20 MG) BY MOUTH DAILY 02/19/21  Yes Just, Laurita Quint, FNP  traZODone (DESYREL) 50 MG tablet Take 3-4 tablets (150-200 mg total) by mouth at bedtime. 02/19/21  Yes Just, Laurita Quint, FNP     Vital Signs: BP 126/79 (BP Location: Right Arm)   Pulse 98   Temp 98.8 F (37.1 C) (Oral)   Resp 17   Ht 5' 5.5" (1.664 m)   Wt 109 lb 12.6 oz (49.8 kg)   SpO2 97%   BMI 17.99 kg/m   Physical Exam Vitals reviewed.  Constitutional:      General: He is not in acute distress. Pulmonary:     Effort: Pulmonary effort is normal.  Abdominal:     General: Abdomen is flat.  Skin:    General: Skin is warm and dry.     Comments: Positive LLQ drain to a suction bulb. Site is unremarkable with no erythema, edema, tenderness, bleeding or drainage. Suture and stat lock in place. Dressing is clean, dry, and intact. ~15 ml of  purulent, pink tinged fluid noted in the bulb. Drain aspirates and flushes well.    Neurological:     Mental Status: He is alert.  Psychiatric:        Behavior: Behavior normal.     Imaging: No results found.  Labs:  CBC: Recent Labs    08/26/22 0319 08/28/22 0307 08/29/22 1211 08/30/22 0440  WBC 15.3* 13.7* 19.3* 15.3*  HGB 9.5* 10.2* 9.7* 10.2*  HCT 30.1* 31.6* 30.3* 30.6*  PLT 457* 558* 633* 598*  COAGS: Recent Labs    08/24/22 1415  INR 1.1     BMP: Recent Labs    08/26/22 0319 08/28/22 0307 08/29/22 0308 08/30/22 0440  NA 135 137 140 136  K 4.1 4.2 4.0 3.9  CL 102 103 106 105  CO2 '24 26 25 23  '$ GLUCOSE 122* 126* 130* 134*  BUN '10 11 13 11  '$ CALCIUM 8.6* 9.0 8.6* 8.7*  CREATININE 0.52* 0.50* 0.49* 0.43*  GFRNONAA >60 >60 >60 >60     LIVER FUNCTION TESTS: Recent Labs    08/22/22 0418 08/25/22 0325 08/26/22 0319 08/29/22 0308  BILITOT 0.6 0.4 0.5 0.3  AST '15 16 18 19  '$ ALT '10 12 13 14  '$ ALKPHOS 47 74 82 76  PROT 5.6* 6.3* 7.0 7.3  ALBUMIN 1.9* 1.8* 2.0* 2.2*     Assessment and Plan:  61 y.o. male with colonic  perforation/obstruction/stercoral ulcer s/p ex lap and colostomy on 08/16/22, developed left retroperitoneal gas/fluid collection  S/p aspiration and drain placement on 08/24/22 by Dr. Laurence Ferrari.   VS tachycardic at times but stable otherwise, afebrile  WBC fluctuating, 15.3 today (19.3<13.7<15.3) Cx E coil  Op 15 mL purulent, pink tinged. Unchanged since placement.    Drain Location: LLQ Size: Fr size: 12 Fr Date of placement: 9/23  Currently to: Drain collection device: suction bulb 24 hour output:  Output by Drain (mL) 08/28/22 0701 - 08/28/22 1900 08/28/22 1901 - 08/29/22 0700 08/29/22 0701 - 08/29/22 1900 08/29/22 1901 - 08/30/22 0700 08/30/22 0701 - 08/30/22 1126  Closed System Drain 1 Lateral LLQ Bulb (JP)    15     Interval imaging/drain manipulation:  None   Current examination: Flushes/aspirates easily.  Insertion site unremarkable. Suture and stat lock in place. Dressed appropriately.   Plan: CT AP w/o ordered per CCS, will review.   Continue TID flushes with 5 cc NS. Record output Q shift. Dressing changes QD or PRN if soiled.  Call IR APP or on call IR MD if difficulty flushing or sudden change in drain output.  Repeat imaging/possible drain injection once output < 10 mL/QD (excluding flush material). Consideration for drain removal if output is < 10 mL/QD (excluding flush material), pending discussion with the providing surgical service.  Discharge planning: Please contact IR APP or on call IR MD prior to patient d/c to ensure appropriate follow up plans are in place. Typically patient will follow up with IR clinic 10-14 days post d/c for repeat imaging/possible drain injection. IR scheduler will contact patient with date/time of appointment. Patient will need to flush drain QD with 5 cc NS, record output QD, dressing changes every 2-3 days or earlier if soiled.   IR will continue to follow - please call with questions or concerns.   Electronically Signed: Tera Mater, PA-C 08/30/2022, 11:23 AM   I spent a total of 15 Minutes at the the patient's bedside AND on the patient's hospital floor or unit, greater than 50% of which was counseling/coordinating care for LLQ drain placement.   This chart was dictated using voice recognition software.  Despite best efforts to proofread,  errors can occur which can change the documentation meaning.

## 2022-08-30 NOTE — Progress Notes (Addendum)
PHARMACY - TOTAL PARENTERAL NUTRITION CONSULT NOTE   Indication: Prolonged ileus  Patient Measurements: Height: 5' 5.5" (166.4 cm) Weight:  (Pt up in chair unable to get weight) IBW/kg (Calculated) : 62.65 TPN AdjBW (KG): 52.6 Body mass index is 17.99 kg/m.  Usual Weight: 115 lbs  Assessment:  61 yo male with a history of chronic pain syndrome on Suboxone, chronic pancreatitis with prior surgery for pseudocyst removal in the 1990s, prior BII in the 1980s, COPD, rheumatoid arthritis on Enbrel, history of seizures not on an AED, and Zollinger-Ellison syndrome who presented with acute onset of abdominal pain along with N/V. He was found to have a WBC of 22K, lactic acid of 3.1 and a CT scan concerning for a colonic perforation. Pharmacy consulted to initiate TPN for prolonged ileus.  Glucose / Insulin: no hx DM, CBGs controlled   Electrolytes: Na 136, K 3.9, Cl 105, CoCa 10.1, phos 3.4. Other electrolytes wnl.  Renal: Scr 0.43 (bsl < 1); BUN wnl Hepatic: LFTs/Tbili wnl. Albumin 2.2. TG 107  Intake / Output; MIVF: UOP 1 ml/kg/hr; R JP drain removed 9/28. L JP drain 15 ml. 125 ml stool charted but not sure on accuracy per surgery, no output per pt.   GI Imaging: 9/15 colonic perforation in the region of the sigmoid colon likely secondary to impacted stool GI Surgeries / Procedures:  9/15 Exploratory laparotomy, Left colostomy and partial sigmoid colectomy  9/23: drain placed for L retroperitoneal abscess (146m fluid aspirated)  Central access: 08/20/22 TPN start date:  08/20/22  Nutritional Goals: Goal TPN rate is 80 mL/hr (provides 100 g of protein and 1993 kcals per day)  RD Assessment: Estimated Needs Total Energy Estimated Needs: 1700-1900 Total Protein Estimated Needs: 85-100 grams Total Fluid Estimated Needs: >/= 1.7 L   Current Nutrition:  9/19 NPO 9/24 CLD 9/25 FLD  Plan:  Adjust TPN for new goals per discussion with RD on 9/29 - goal 90-110g of protein and 1900-2100  kcal  Continue TPN at goal rate of 869mhr to provide 100g AA and 1993 kcal Electrolytes in TPN: K 45 mEq/L (slight inc with TPN formulation change from ~75 mEq/TPN to ~86 mEq/TPN).Continue Na 150 mEq/L, Ca 2 mEq/L Mg 9 mEq/L (~ same as previous TPN due to formulation change), phos 16 mmol/L (~ same as previous TPN due to formulation change). Continue Cl:Ac 1:1.  Add standard MVI and trace elements to TPN Monitor CBGs - continue off SSI since CBGs controlled Monitor TPN labs on Mon/Thurs, as needed Follow up toleration and advancement of diet and colostomy output   Thank you for allowing pharmacy to be a part of this patient's care.  GrCristela FeltPharmD, BCPS Clinical Pharmacist 08/30/2022 7:26 AM

## 2022-08-30 NOTE — Progress Notes (Signed)
[facility]                                                                              Jerry Knight, is a 61 y.o. male, DOB - [birth year], QQP:[id number] Admit date - 08/16/2022    Outpatient Primary MD for the patient is Irwin Brakeman [name], [place]  LOS - 14  days  Chief Complaint  Patient presents with   Abdominal Pain       Brief summary   AMANUEL Knight is a 61 y.o. male with medical history significant of  COPD, chronic pancreatitis, rheumatoid arthritis on Enbrel, history of seizures not on an AED, chronic pain on Suboxone, polysubstance abuse, Zollinger-Ellison syndrome, and constipation who presented with complaints of  lower abdominal pain.  Imaging remarkable for colonic perforation/obstruction  Patient was admitted for further work-up   Assessment & Plan    Principal Problem: Sepsis secondary to bowel perforation ([place]) due to stercoral ulcer/stool impaction, left  retroperitoneal abscess --Met sepsis criteria on admission with tachycardia, tachypnea, leukocytosis, lactic acidosis - underwent emergent exploratory laparotomy, left colostomy and partial sigmoid colectomy on 9/15 -Repeat CT on 9/22 with new 7 cm fluid collection in the LLQ s/p drain placement by IR on 9/23, cultures showing abundant E. Coli -finished abx treatment (zosyn 9/14 - 9/26. Unasyn 9/26 - 9/28) -on TPN, ambulating , diet order per gen surg -does not want to eat, no stool output from colostomy except scan liquid -repeat CT on 9/29, plan per surgery  Acute on chronic pain  -Outpatient on Suboxone -Off the PCA now complaining of uncontrolled pain and abdominal cramps last night -Continue oral oxycodone PRN and morphine  IV PRN for breakthrough pain.  - continue tizanidine, continue Lidoderm patch as needed   Normocytic anemia -Likely anemia of chronic disease -HTN stable 10.2  Hypokalemia, hyponatremia -Continue TPN  COPD, without exacerbation - Chest x-ray with chronic  interstitial lung changes with bibasilar atelectasis. -Stable, no wheezing, continue albuterol nebs as needed   History of rheumatoid arthritis -Outpatient on Enbrel 50 mg and methotrexate 10 mg on Saturdays -Currently regimen on hold  Insomnia, anxiety -For now continue Ativan as needed for anxiety -Continue trazodone 150 mg at bedtime    GERD -Continue PPI    Tobacco abuse Patient reportedly smokes 2 packs of cigarettes per day on average. - cont Nicotine patch  Moderate to severe protein calorie malnutrition due to acute illness, hypoalbuminemia 1.9, muscle wasting Estimated body mass index is 17.99 kg/m as calculated from the following:   Height as of this encounter: 5' 5.5" (1.664 m).   Weight as of this encounter: 49.8 kg. -Currently on TPN  Code Status: Full code DVT Prophylaxis:  enoxaparin (LOVENOX) injection 40 mg Start: 08/25/22 1000 SCD's Start: 08/16/22 1754 SCDs Start: 08/16/22 1600   Level of Care: Level of care: Telemetry Surgical Family Communication:  wife updated at the bedside on 9/25, patient alert and oriented.  Disposition Plan:       Not stable for discharge until cleared by general surgery   Procedures:  9/15 : ex lap partial sigmoid colectomy, colostomy  9/23: Drain into left retroperitoneal  abscess with 140 mL purulent fluid aspirated  Consultants:   General surgery Interventional radiology  Antimicrobials:   Anti-infectives (From admission, onward)    Start     Dose/Rate Route Frequency Ordered Stop   08/27/22 1300  Ampicillin-Sulbactam (UNASYN) 3 g in sodium chloride 0.9 % 100 mL IVPB        3 g 200 mL/hr over 30 Minutes Intravenous Every 8 hours 08/27/22 1202 08/29/22 0447   08/17/22 1630  piperacillin-tazobactam (ZOSYN) IVPB 3.375 g  Status:  Discontinued        3.375 g 12.5 mL/hr over 240 Minutes Intravenous Every 8 hours 08/17/22 1540 08/27/22 1202   08/16/22 1500  piperacillin-tazobactam (ZOSYN) IVPB 3.375 g        3.375  g 100 mL/hr over 30 Minutes Intravenous STAT 08/16/22 1446 08/16/22 2032   08/16/22 0615  piperacillin-tazobactam (ZOSYN) IVPB 3.375 g        3.375 g 100 mL/hr over 30 Minutes Intravenous  Once 08/16/22 0609 08/16/22 0713          Medications  acetaminophen  1,000 mg Oral Q6H   atorvastatin  20 mg Oral Daily   Chlorhexidine Gluconate Cloth  6 each Topical Daily   docusate sodium  100 mg Oral BID   enoxaparin (LOVENOX) injection  40 mg Subcutaneous Q24H   feeding supplement  237 mL Oral TID BM   lidocaine  1 patch Transdermal Q24H   nicotine  21 mg Transdermal QHS   pantoprazole  40 mg Oral Daily   polyethylene glycol  17 g Oral BID   sodium chloride flush  3 mL Intravenous Q12H   sodium chloride flush  5 mL Intracatheter Q8H   tiZANidine  2 mg Oral Q8H   traZODone  150 mg Oral QHS      Subjective:   [name] was seen and examined today.    does not want to eat,   Small amount of brown liquid output in ostomy.   Tolerating TPN He is npo, awaiting for repeat CT ab/pel  Objective:   Vitals:   08/29/22 1950 08/30/22 0446 08/30/22 0817 08/30/22 1505  BP: (!) 144/88 130/79 126/79 130/76  Pulse: 96 (!) 105 98 (!) 104  Resp: 16 15 17 14   Temp: 98.4 F (36.9 C) 98.3 F (36.8 C) 98.8 F (37.1 C) 98.4 F (36.9 C)  TempSrc: Oral Oral Oral Oral  SpO2: 98% 98% 97% 98%  Weight:      Height:        Intake/Output Summary (Last 24 hours) at 08/30/2022 1913 Last data filed at 08/30/2022 1806 Gross per 24 hour  Intake 1880.6 ml  Output 1570 ml  Net 310.6 ml     Wt Readings from Last 3 Encounters:  08/29/22 49.8 kg  07/27/22 53.5 kg  11/09/21 52.2 kg   Physical Exam General: Alert and oriented x 3, NAD, thin, malnourished  Cardiovascular: S1 S2 clear, RRR.  Respiratory: CTAB, no wheezing Gastrointestinal: Soft, stoma pink and viable, dressing intact midline, small amount brown liquid output in ostomy Ext: no pedal edema bilaterally Neuro: no new  deficits Psych: calm and cooperative today  Data Reviewed:  I have personally reviewed following labs    CBC Lab Results  Component Value Date   WBC 15.3 (H) 08/30/2022   RBC 3.31 (L) 08/30/2022   HGB 10.2 (L) 08/30/2022   HCT 30.6 (L) 08/30/2022   MCV 92.4 08/30/2022   MCH 30.8 08/30/2022   PLT 598 (H) 08/30/2022  MCHC 33.3 08/30/2022   RDW 14.4 08/30/2022   LYMPHSABS 1.1 08/16/2022   MONOABS 1.4 (H) 08/16/2022   EOSABS 0.0 08/16/2022   BASOSABS 0.0 66/59/9357     Last metabolic panel Lab Results  Component Value Date   NA 136 08/30/2022   K 3.9 08/30/2022   CL 105 08/30/2022   CO2 23 08/30/2022   BUN 11 08/30/2022   CREATININE 0.43 (L) 08/30/2022   GLUCOSE 134 (H) 08/30/2022   GFRNONAA >60 08/30/2022   GFRAA 123 10/12/2020   CALCIUM 8.7 (L) 08/30/2022   PHOS 3.4 08/30/2022   PROT 7.3 08/29/2022   ALBUMIN 2.2 (L) 08/29/2022   LABGLOB 3.0 10/12/2020   AGRATIO 1.5 10/12/2020   BILITOT 0.3 08/29/2022   ALKPHOS 76 08/29/2022   AST 19 08/29/2022   ALT 14 08/29/2022   ANIONGAP 8 08/30/2022    CBG (last 3)  Recent Labs    08/30/22 0817 08/30/22 1148 08/30/22 1809  GLUCAP 117* 138* 107*      Coagulation Profile: Recent Labs  Lab 08/24/22 1415  INR 1.1     Radiology Studies: I have personally reviewed the imaging studies  No results found.     Florencia Reasons M.D. PhD FACP [facility] 08/30/2022, 7:13 PM  Available via Epic secure chat 7am-7pm After 7 pm, please refer to night coverage provider listed on amion.

## 2022-08-30 NOTE — Progress Notes (Signed)
Nutrition Follow-up  DOCUMENTATION CODES:  Severe malnutrition in context of chronic illness  INTERVENTION:  TPN management per pharmacy Continue current diet as ordered, advance as able Encourage PO intake Ensure Enlive po TID, each supplement provides 350 kcal and 20 grams of protein. Assess the following labs which could be impacted by pt's extensive hx of GI surgeries/resections and inhibiting wound healing: Vitamin C, Zinc, Vitamin A, Vitamin D, Thiamin, folate, Vitamin B12, and Vitamin B6  NUTRITION DIAGNOSIS:  Severe Malnutrition related to chronic illness as evidenced by severe muscle depletion, severe fat depletion. -remains applicable  GOAL:  Patient will meet greater than or equal to 90% of their needs - being addressed with TPN  MONITOR:  Diet advancement, Labs, I & O's, Weight trends  REASON FOR ASSESSMENT:  Consult New TPN/TNA  ASSESSMENT:  61 y.o. male presented to the ED with abdominal pain. PMH includes COPD, GERD, chronic pancreatitis, polysubstance abuse, and tobacco abuse. Pt admitted with sepsis 2/2 bowel perforation requiring emergent ex-lap.   9/15 - Ex-lap s/p partial sigmoid colectomy, colostomy 9/18 - NGT removed 9/19 - TPN started   9/23 - IR placed drain for left retroperitoneal abscess  Pt continues with poor PO and TPN providing 100% of needs. Reviewed images of surgical wound. Will increase estimated nutrition needs and also plan to assess several micronutrient labs to ensure that pt is being maximized for would healing.  Discussed with pharmacy.  Nutritionally Relevant Medications: Scheduled Meds:  atorvastatin  20 mg Oral Daily   docusate sodium  100 mg Oral BID   feeding supplement  237 mL Oral TID BM   pantoprazole  40 mg Oral Daily   polyethylene glycol  17 g Oral BID   Continuous Infusions:  TPN ADULT (ION) 70 mL/hr at 08/30/22 0553   PRN Meds: diphenhydrAMINE, ondansetron  Labs Reviewed Creatinine .38 CBG ranges from 109-150  mg/dL over the last 24 hours  NUTRITION - FOCUSED PHYSICAL EXAM: Flowsheet Row Most Recent Value  Orbital Region Severe depletion  Upper Arm Region Severe depletion  Thoracic and Lumbar Region Severe depletion  Buccal Region Severe depletion  Temple Region Moderate depletion  Clavicle Bone Region Severe depletion  Clavicle and Acromion Bone Region Severe depletion  Scapular Bone Region Severe depletion  Dorsal Hand Mild depletion  Patellar Region Unable to assess  Anterior Thigh Region Unable to assess  Posterior Calf Region Unable to assess  Edema (RD Assessment) None  Hair Reviewed  Eyes Reviewed  Mouth Reviewed  Skin Reviewed  Nails Reviewed   Diet Order:   Diet Order             Diet full liquid Room service appropriate? Yes; Fluid consistency: Thin  Diet effective now                   EDUCATION NEEDS:   No education needs have been identified at this time  Skin:  Skin Assessment: Reviewed RN Assessment  Last BM:  9/28 via colostomy  Height:  Ht Readings from Last 1 Encounters:  08/16/22 5' 5.5" (1.664 m)   Weight:  Wt Readings from Last 1 Encounters:  08/29/22 49.8 kg    Ideal Body Weight:  64.6 kg  BMI:  Body mass index is 17.99 kg/m.  Estimated Nutritional Needs:  Kcal:  1900-2100 kcal/d Protein:  90-110g/d Fluid:  >/= 1.7 L   Ranell Patrick, RD, LDN Clinical Dietitian RD pager # available in AMION  After hours/weekend pager # available in [facility]

## 2022-08-30 NOTE — Progress Notes (Signed)
Mobility Specialist Progress Note   08/30/22 1500  Mobility  Activity Ambulated with assistance in room  Range of Motion/Exercises Active;All extremities  Level of Assistance Standby assist, set-up cues, supervision of patient - no hands on  Assistive Device Other (Comment) (IV)  Distance Ambulated (ft) 25 ft  Activity Response Tolerated well   Patient received in recliner. Deferred hallway ambulation but agreeable to ambulate in room. Ambulated short distance in room, supervision level with IV pole. Returned to recliner without complaint or incident. Was left with all needs met, call bell in reach.   [place] Zennie Ayars, [place], [place]  JFTNB:[phone] Office: [phone]

## 2022-08-30 NOTE — Plan of Care (Signed)
  Problem: Clinical Measurements: Goal: Ability to maintain clinical measurements within normal limits will improve Outcome: Progressing   Problem: Clinical Measurements: Goal: Will remain free from infection Outcome: Progressing   Problem: Clinical Measurements: Goal: Diagnostic test results will improve Outcome: Progressing   Problem: Activity: Goal: Risk for activity intolerance will decrease Outcome: Progressing   Problem: Nutrition: Goal: Adequate nutrition will be maintained Outcome: Progressing

## 2022-08-31 DIAGNOSIS — K631 Perforation of intestine (nontraumatic): Secondary | ICD-10-CM | POA: Diagnosis not present

## 2022-08-31 LAB — GLUCOSE, CAPILLARY
Glucose-Capillary: 128 mg/dL — ABNORMAL HIGH (ref 70–99)
Glucose-Capillary: 128 mg/dL — ABNORMAL HIGH (ref 70–99)
Glucose-Capillary: 132 mg/dL — ABNORMAL HIGH (ref 70–99)
Glucose-Capillary: 137 mg/dL — ABNORMAL HIGH (ref 70–99)
Glucose-Capillary: 138 mg/dL — ABNORMAL HIGH (ref 70–99)

## 2022-08-31 LAB — VITAMIN B12: Vitamin B-12: 1090 pg/mL — ABNORMAL HIGH (ref 180–914)

## 2022-08-31 LAB — FOLATE: Folate: 18.2 ng/mL (ref >5.9)

## 2022-08-31 LAB — VITAMIN D 25 HYDROXY (VIT D DEFICIENCY, FRACTURES): Vit D, 25-Hydroxy: 28.02 ng/mL — ABNORMAL LOW (ref 30–100)

## 2022-08-31 MED ORDER — OXYCODONE HCL ER 15 MG PO T12A
15.0000 mg | EXTENDED_RELEASE_TABLET | Freq: Two times a day (BID) | ORAL | Status: DC
  Administered 2022-08-31 – 2022-09-06 (×13): 15 mg via ORAL
  Filled 2022-08-31 (×13): qty 1

## 2022-08-31 MED ORDER — OXYCODONE HCL 5 MG PO TABS
5.0000 mg | ORAL_TABLET | ORAL | Status: DC | PRN
  Administered 2022-08-31 – 2022-09-04 (×10): 5 mg via ORAL
  Filled 2022-08-31 (×10): qty 1

## 2022-08-31 MED ORDER — TRAVASOL 10 % IV SOLN
INTRAVENOUS | Status: AC
  Filled 2022-08-31: qty 998.4

## 2022-08-31 MED ORDER — METHOCARBAMOL 500 MG PO TABS: 500.0000 mg | ORAL_TABLET | Freq: Three times a day (TID) | ORAL | Status: DC

## 2022-08-31 NOTE — Progress Notes (Signed)
[facility]                                                                              Jerry Knight, is a 61 y.o. male, DOB - October 10, 1961, XKG:[id number] Admit date - 08/16/2022    Outpatient Primary MD for the patient is Irwin Brakeman, [name], [place]  LOS - 15  days  Chief Complaint  Patient presents with   Abdominal Pain       Brief summary   Jerry Knight is a 61 y.o. male with medical history significant of  COPD, chronic pancreatitis, rheumatoid arthritis on Enbrel, history of seizures not on an AED, chronic pain on Suboxone, polysubstance abuse, Zollinger-Ellison syndrome, and constipation who presented with complaints of  lower abdominal pain.  Imaging remarkable for colonic perforation/obstruction  Patient was admitted for further work-up   Assessment & Plan    Principal Problem: Sepsis secondary to bowel perforation ([place]) due to stercoral ulcer/stool impaction, left  retroperitoneal abscess --Met sepsis criteria on admission with tachycardia, tachypnea, leukocytosis, lactic acidosis - underwent emergent exploratory laparotomy, left colostomy and partial sigmoid colectomy on 9/15 -Repeat CT on 9/22 with new 7 cm fluid collection in the LLQ s/p drain placement by IR on 9/23, cultures showing abundant E. Coli -finished abx treatment (zosyn 9/14 - 9/26. Unasyn 9/26 - 9/28) -on TPN, ambulating , diet order per gen surg -does not want to eat, no stool output from colostomy except scan liquid -repeat CT on 9/29, plan per surgery  Acute on chronic pain  -Outpatient on Suboxone -Off the PCA now complaining of uncontrolled pain and abdominal cramps last night -Continue oral oxycodone PRN and morphine  IV PRN for breakthrough pain.  - continue tizanidine, continue Lidoderm patch as needed   Normocytic anemia -Likely anemia of chronic disease -HTN stable 10.2  Hypokalemia, hyponatremia -Continue TPN  COPD, without exacerbation - Chest x-ray with chronic  interstitial lung changes with bibasilar atelectasis. -Stable, no wheezing, continue albuterol nebs as needed   History of rheumatoid arthritis -Outpatient on Enbrel 50 mg and methotrexate 10 mg on Saturdays -Currently regimen on hold  Insomnia, anxiety -For now continue Ativan as needed for anxiety -Continue trazodone 150 mg at bedtime    GERD -Continue PPI    Tobacco abuse Patient reportedly smokes 2 packs of cigarettes per day on average. - cont Nicotine patch  7 mm right lower lobe pulmonary nodule. Non-contrast chest CT at 6-12 months is recommended. If the nodule is stable at time of repeat CT, then future CT at 18-24 months (from today's scan) is considered optional for low-risk patients, but is recommended for high-risk patients  Moderate to severe protein calorie malnutrition due to acute illness, hypoalbuminemia 1.9, muscle wasting Estimated body mass index is 17.85 kg/m as calculated from the following:   Height as of this encounter: 5' 5.5" (1.664 m).   Weight as of this encounter: 49.4 kg. -Currently on TPN  Code Status: Full code DVT Prophylaxis:  enoxaparin (LOVENOX) injection 40 mg Start: 08/25/22 1000 SCD's Start: 08/16/22 1754 SCDs Start: 08/16/22 1600   Level of Care: Level of care: Telemetry Surgical Family Communication:  wife updated at the bedside on 9/25, patient alert and oriented.  Disposition Plan:       Not stable for discharge until cleared by general surgery   Procedures:  9/15 : ex lap partial sigmoid colectomy, colostomy  9/23: Drain into left retroperitoneal abscess with 140 mL purulent fluid aspirated  Consultants:   General surgery Interventional radiology  Antimicrobials:   Anti-infectives (From admission, onward)    Start     Dose/Rate Route Frequency Ordered Stop   08/27/22 1300  Ampicillin-Sulbactam (UNASYN) 3 g in sodium chloride 0.9 % 100 mL IVPB        3 g 200 mL/hr over 30 Minutes Intravenous Every 8 hours 08/27/22  1202 08/29/22 0447   08/17/22 1630  piperacillin-tazobactam (ZOSYN) IVPB 3.375 g  Status:  Discontinued        3.375 g 12.5 mL/hr over 240 Minutes Intravenous Every 8 hours 08/17/22 1540 08/27/22 1202   08/16/22 1500  piperacillin-tazobactam (ZOSYN) IVPB 3.375 g        3.375 g 100 mL/hr over 30 Minutes Intravenous STAT 08/16/22 1446 08/16/22 2032   08/16/22 0615  piperacillin-tazobactam (ZOSYN) IVPB 3.375 g        3.375 g 100 mL/hr over 30 Minutes Intravenous  Once 08/16/22 0609 08/16/22 0713          Medications  acetaminophen  1,000 mg Oral Q6H   atorvastatin  20 mg Oral Daily   Chlorhexidine Gluconate Cloth  6 each Topical Daily   docusate sodium  100 mg Oral BID   enoxaparin (LOVENOX) injection  40 mg Subcutaneous Q24H   feeding supplement  237 mL Oral TID BM   lidocaine  1 patch Transdermal Q24H   nicotine  21 mg Transdermal QHS   pantoprazole  40 mg Oral Daily   polyethylene glycol  17 g Oral BID   sodium chloride flush  3 mL Intravenous Q12H   sodium chloride flush  5 mL Intracatheter Q8H   tiZANidine  2 mg Oral Q8H   traZODone  150 mg Oral QHS      Subjective:   Jerry Knight was seen and examined today.    does not want to eat,   Small amount of brown liquid output in ostomy.   Tolerating TPN On full liquid diet  Objective:   Vitals:   08/30/22 1505 08/30/22 1953 08/31/22 0336 08/31/22 0401  BP: 130/76 (!) 148/92  115/73  Pulse: (!) 104 (!) 108  (!) 102  Resp: 14 18  16   Temp: 98.4 F (36.9 C) 98.6 F (37 C)  98 F (36.7 C)  TempSrc: Oral Oral  Oral  SpO2: 98% 97%  95%  Weight:   49.4 kg   Height:        Intake/Output Summary (Last 24 hours) at 08/31/2022 0750 Last data filed at 08/31/2022 0404 Gross per 24 hour  Intake 1643.77 ml  Output 1385 ml  Net 258.77 ml     Wt Readings from Last 3 Encounters:  08/31/22 49.4 kg  07/27/22 53.5 kg  11/09/21 52.2 kg   Physical Exam General: Alert and oriented x 3, NAD, thin, malnourished   Cardiovascular: S1 S2 clear, RRR.  Respiratory: CTAB, no wheezing Gastrointestinal: Soft, stoma pink and viable, dressing intact midline, small amount brown liquid output in ostomy Ext: no pedal edema bilaterally Neuro: no new deficits Psych: calm and cooperative today  Data Reviewed:  I have personally reviewed following labs    CBC Lab Results  Component Value  Date   WBC 15.3 (H) 08/30/2022   RBC 3.31 (L) 08/30/2022   HGB 10.2 (L) 08/30/2022   HCT 30.6 (L) 08/30/2022   MCV 92.4 08/30/2022   MCH 30.8 08/30/2022   PLT 598 (H) 08/30/2022   MCHC 33.3 08/30/2022   RDW 14.4 08/30/2022   LYMPHSABS 1.1 08/16/2022   MONOABS 1.4 (H) 08/16/2022   EOSABS 0.0 08/16/2022   BASOSABS 0.0 33/29/5188     Last metabolic panel Lab Results  Component Value Date   NA 136 08/30/2022   K 3.9 08/30/2022   CL 105 08/30/2022   CO2 23 08/30/2022   BUN 11 08/30/2022   CREATININE 0.43 (L) 08/30/2022   GLUCOSE 134 (H) 08/30/2022   GFRNONAA >60 08/30/2022   GFRAA 123 10/12/2020   CALCIUM 8.7 (L) 08/30/2022   PHOS 3.4 08/30/2022   PROT 7.3 08/29/2022   ALBUMIN 2.2 (L) 08/29/2022   LABGLOB 3.0 10/12/2020   AGRATIO 1.5 10/12/2020   BILITOT 0.3 08/29/2022   ALKPHOS 76 08/29/2022   AST 19 08/29/2022   ALT 14 08/29/2022   ANIONGAP 8 08/30/2022    CBG (last 3)  Recent Labs    08/31/22 0000 08/31/22 0403 08/31/22 0743  GLUCAP 132* 128* 137*      Coagulation Profile: Recent Labs  Lab 08/24/22 1415  INR 1.1     Radiology Studies: I have personally reviewed the imaging studies  CT ABDOMEN PELVIS WO CONTRAST  Result Date: 08/30/2022 CLINICAL DATA:  Postop abdominal pain.  Bowel perforation. EXAM: CT ABDOMEN AND PELVIS WITHOUT CONTRAST TECHNIQUE: Multidetector CT imaging of the abdomen and pelvis was performed following the standard protocol without IV contrast. RADIATION DOSE REDUCTION: This exam was performed according to the departmental dose-optimization program which includes  automated exposure control, adjustment of the mA and/or kV according to patient size and/or use of iterative reconstruction technique. COMPARISON:  08/23/2022. FINDINGS: Lower chest: The heart is normal in size and there is a small pericardial effusion. Patchy atelectasis or infiltrate is noted at the lung bases bilaterally, greater on the left than on the right. There is a 7 mm pleural-based nodule in the right lower lobe, axial image 11. Hepatobiliary: No focal liver abnormality is seen. No gallstones, gallbladder wall thickening, or biliary dilatation. Pancreas: Unremarkable. No pancreatic ductal dilatation or surrounding inflammatory changes. Spleen: Normal in size without focal abnormality. Adrenals/Urinary Tract: The adrenal glands are within normal limits. There is a cyst in the upper pole of the right kidney. Cortical calcifications and scarring is noted on the right. An extrarenal pelvis is noted on the left. No hydroureteronephrosis bilaterally. The bladder is unremarkable. Stomach/Bowel: Surgical changes are noted at the stomach and in the epigastric region. There is a patulous loop of small bowel in the mid left abdomen at an anastomotic site. No bowel obstruction, free air, or pneumatosis. The patient is status post sigmoidectomy with left lower quadrant colostomy. There is bowel wall thickening involving the distal descending colon at the colostomy site. The appendix is surgically absent. Vascular/Lymphatic: Aortic atherosclerosis. No enlarged abdominal or pelvic lymph nodes. Reproductive: Prostate is unremarkable. Other: Postsurgical changes are noted in the anterior abdominal wall. Small amount of fluid is noted in the left pericolic gutter. A surgical drain terminates in the pelvis in the left lower quadrant lateral to the psoas muscle. Minimal residual soft tissue thickening with a few foci of air identified at the drain tip in the previously described abscess is no longer seen. Musculoskeletal: No  acute osseous abnormality.  IMPRESSION: 1. Status post sigmoidectomy with left lower quadrant ileostomy. There is mild colonic wall thickening involving the distal descending colon which is likely related to postsurgical changes. No bowel obstruction. 2. Left lower quadrant pigtail catheter terminating lateral to the psoas muscle on the left. Minimal residual soft tissue density and air is noted. No drainable fluid collection is identified. 3. 7 mm right lower lobe pulmonary nodule. Non-contrast chest CT at 6-12 months is recommended. If the nodule is stable at time of repeat CT, then future CT at 18-24 months (from today's scan) is considered optional for low-risk patients, but is recommended for high-risk patients. This recommendation follows the consensus statement: Guidelines for Management of Incidental Pulmonary Nodules Detected on CT Images: From the Fleischner Society 2017; Radiology 2017; 284:228-243. 4. Patchy atelectasis or infiltrate at the lung bases bilaterally. 5. Aortic atherosclerosis. Electronically Signed   By: Brett Fairy M.D.   On: 08/30/2022 20:39       Florencia Reasons M.D. PhD FACP [facility] 08/31/2022, 7:50 AM  Available via Epic secure chat 7am-7pm After 7 pm, please refer to night coverage provider listed on amion.

## 2022-08-31 NOTE — Progress Notes (Signed)
PHARMACY - TOTAL PARENTERAL NUTRITION CONSULT NOTE   Indication: Prolonged ileus  Patient Measurements: Height: 5' 5.5" (166.4 cm) Weight: 49.4 kg (108 lb 14.5 oz) IBW/kg (Calculated) : 62.65 TPN AdjBW (KG): 52.6 Body mass index is 17.85 kg/m.  Usual Weight: 115 lbs  Assessment:  61 yo male with a history of chronic pain syndrome on Suboxone, chronic pancreatitis with prior surgery for pseudocyst removal in the 1990s, prior BII in the 1980s, COPD, rheumatoid arthritis on Enbrel, history of seizures not on an AED, and Zollinger-Ellison syndrome who presented with acute onset of abdominal pain along with N/V. He was found to have a WBC of 22K, lactic acid of 3.1 and a CT scan concerning for a colonic perforation. Pharmacy consulted to initiate TPN for prolonged ileus.  Glucose / Insulin: no hx DM, CBGs controlled   Electrolytes: last labs 9/29: all stable WNL  Renal: Scr 0.43 stable (bsl < 1); BUN wnl Hepatic: LFTs / Tbili / TG wnl. Albumin 2.2 Intake / Output; MIVF: UOP 1.1 ml/kg/hr; R JP drain removed 9/28. L JP drain 30 ml. 5 ml colostomy output  GI Imaging: 9/15 colonic perforation in the region of the sigmoid colon likely secondary to impacted stool 9/29 CT abd/pelvis: no obstruction or drainable fluid GI Surgeries / Procedures:  9/15 Exploratory laparotomy, Left colostomy and partial sigmoid colectomy  9/23: drain placed for L retroperitoneal abscess (134m fluid aspirated)  Central access: 08/20/22 TPN start date:  08/20/22  Nutritional Goals: Goal TPN rate is 80 mL/hr (provides 100 g of protein and 1993 kcals per day)  RD Assessment: Estimated Needs Total Energy Estimated Needs: 1900-2100 kcal/d Total Protein Estimated Needs: 90-110g/d Total Fluid Estimated Needs: >/= 1.7 L   Current Nutrition:  TPN Trial CLD again 9/30 Ensure TID - 1 dose charted yesterday  Plan:  Continue TPN at goal rate 83mhr TPN will provide 100g protein and 1993 kcal, meeting 100% of  patient needs Electrolytes in TPN: Na 150 mEq/L, K 45 mEq/L, Ca 2 mEq/L Mg 9 mEq/L, phos 16 mmol/L. Cl:Ac 1:1 Add standard MVI and trace elements to TPN Monitor CBGs - continue off SSI with CBGs controlled Monitor standard TPN labs on Mon/Thurs and as needed Follow up toleration and advancement of diet and colostomy output    HaArturo MortonPharmD, BCPS Please check AMION for all MCClontarfontact numbers Clinical Pharmacist 08/31/2022 10:44 AM

## 2022-08-31 NOTE — Plan of Care (Signed)
  Problem: Nutrition: Goal: Adequate nutrition will be maintained Outcome: Progressing   Problem: Pain Managment: Goal: General experience of comfort will improve Outcome: Progressing   Problem: Safety: Goal: Ability to remain free from injury will improve Outcome: Progressing   

## 2022-08-31 NOTE — Progress Notes (Signed)
15 Days Post-Op  Subjective: Continues to have pain issues.  Hiccups slightly better with thorazine.  Trying liquids tends to make his stomach feel worse so remains NPO.  No output from colostomy yet.  CT with no intra-abdominal infectious or complication findings  Objective: Vital signs in last 24 hours: Temp:  [98 F (36.7 C)-98.6 F (37 C)] 98.2 F (36.8 C) (09/30 0750) Pulse Rate:  [102-108] 104 (09/30 0750) Resp:  [14-18] 18 (09/30 0750) BP: (115-148)/(73-92) 134/75 (09/30 0750) SpO2:  [95 %-99 %] 99 % (09/30 0750) Weight:  [49.4 kg] 49.4 kg (09/30 0336) Last BM Date : 08/30/22  Intake/Output from previous day: 09/29 0701 - 09/30 0700 In: 1643.8 [I.V.:1593.8; IV Piggyback:50] Out: 6384 [Urine:1350; Drains:30; Stool:5] Intake/Output this shift: Total I/O In: -  Out: 10 [Drains:10]  PE: General: pleasant, WD, male who is laying in bed in NAD Lungs: Respiratory effort nonlabored Abd: Soft, maybe some mild distension, some L>R sided ttp without rigidity or guarding. Colostomy with small amount of air and sweat in bag. Stoma pink and viable. Surgical JP drain site with dressing in place, cdi. IR drain with dark bloody purulent output, 30cc/24 hours. Midline wound with separation as noted on prior pictures/exam. Most of the wound with layer of granulation tissue overlying and less fibrinous exudate compared to prior    Lab Results:  Recent Labs    08/29/22 1211 08/30/22 0440  WBC 19.3* 15.3*  HGB 9.7* 10.2*  HCT 30.3* 30.6*  PLT 633* 598*   BMET Recent Labs    08/29/22 0308 08/30/22 0440  NA 140 136  K 4.0 3.9  CL 106 105  CO2 25 23  GLUCOSE 130* 134*  BUN 13 11  CREATININE 0.49* 0.43*  CALCIUM 8.6* 8.7*   PT/INR No results for input(s): "LABPROT", "INR" in the last 72 hours. CMP     Component Value Date/Time   NA 136 08/30/2022 0440   NA 140 10/12/2020 1526   K 3.9 08/30/2022 0440   CL 105 08/30/2022 0440   CO2 23 08/30/2022 0440   GLUCOSE 134  (H) 08/30/2022 0440   BUN 11 08/30/2022 0440   BUN 10 10/12/2020 1526   CREATININE 0.43 (L) 08/30/2022 0440   CREATININE 0.77 01/27/2013 1105   CALCIUM 8.7 (L) 08/30/2022 0440   PROT 7.3 08/29/2022 0308   PROT 7.5 10/12/2020 1526   ALBUMIN 2.2 (L) 08/29/2022 0308   ALBUMIN 4.5 10/12/2020 1526   AST 19 08/29/2022 0308   ALT 14 08/29/2022 0308   ALKPHOS 76 08/29/2022 0308   BILITOT 0.3 08/29/2022 0308   BILITOT 0.3 10/12/2020 1526   GFRNONAA >60 08/30/2022 0440   GFRAA 123 10/12/2020 1526   Lipase     Component Value Date/Time   LIPASE 39 08/16/2022 0506    Studies/Results: CT ABDOMEN PELVIS WO CONTRAST  Result Date: 08/30/2022 CLINICAL DATA:  Postop abdominal pain.  Bowel perforation. EXAM: CT ABDOMEN AND PELVIS WITHOUT CONTRAST TECHNIQUE: Multidetector CT imaging of the abdomen and pelvis was performed following the standard protocol without IV contrast. RADIATION DOSE REDUCTION: This exam was performed according to the departmental dose-optimization program which includes automated exposure control, adjustment of the mA and/or kV according to patient size and/or use of iterative reconstruction technique. COMPARISON:  08/23/2022. FINDINGS: Lower chest: The heart is normal in size and there is a small pericardial effusion. Patchy atelectasis or infiltrate is noted at the lung bases bilaterally, greater on the left than on the right. There is  a 7 mm pleural-based nodule in the right lower lobe, axial image 11. Hepatobiliary: No focal liver abnormality is seen. No gallstones, gallbladder wall thickening, or biliary dilatation. Pancreas: Unremarkable. No pancreatic ductal dilatation or surrounding inflammatory changes. Spleen: Normal in size without focal abnormality. Adrenals/Urinary Tract: The adrenal glands are within normal limits. There is a cyst in the upper pole of the right kidney. Cortical calcifications and scarring is noted on the right. An extrarenal pelvis is noted on the left. No  hydroureteronephrosis bilaterally. The bladder is unremarkable. Stomach/Bowel: Surgical changes are noted at the stomach and in the epigastric region. There is a patulous loop of small bowel in the mid left abdomen at an anastomotic site. No bowel obstruction, free air, or pneumatosis. The patient is status post sigmoidectomy with left lower quadrant colostomy. There is bowel wall thickening involving the distal descending colon at the colostomy site. The appendix is surgically absent. Vascular/Lymphatic: Aortic atherosclerosis. No enlarged abdominal or pelvic lymph nodes. Reproductive: Prostate is unremarkable. Other: Postsurgical changes are noted in the anterior abdominal wall. Small amount of fluid is noted in the left pericolic gutter. A surgical drain terminates in the pelvis in the left lower quadrant lateral to the psoas muscle. Minimal residual soft tissue thickening with a few foci of air identified at the drain tip in the previously described abscess is no longer seen. Musculoskeletal: No acute osseous abnormality. IMPRESSION: 1. Status post sigmoidectomy with left lower quadrant ileostomy. There is mild colonic wall thickening involving the distal descending colon which is likely related to postsurgical changes. No bowel obstruction. 2. Left lower quadrant pigtail catheter terminating lateral to the psoas muscle on the left. Minimal residual soft tissue density and air is noted. No drainable fluid collection is identified. 3. 7 mm right lower lobe pulmonary nodule. Non-contrast chest CT at 6-12 months is recommended. If the nodule is stable at time of repeat CT, then future CT at 18-24 months (from today's scan) is considered optional for low-risk patients, but is recommended for high-risk patients. This recommendation follows the consensus statement: Guidelines for Management of Incidental Pulmonary Nodules Detected on CT Images: From the Fleischner Society 2017; Radiology 2017; 284:228-243. 4. Patchy  atelectasis or infiltrate at the lung bases bilaterally. 5. Aortic atherosclerosis. Electronically Signed   By: Brett Fairy M.D.   On: 08/30/2022 20:39    Anti-infectives: Anti-infectives (From admission, onward)    Start     Dose/Rate Route Frequency Ordered Stop   08/27/22 1300  Ampicillin-Sulbactam (UNASYN) 3 g in sodium chloride 0.9 % 100 mL IVPB        3 g 200 mL/hr over 30 Minutes Intravenous Every 8 hours 08/27/22 1202 08/29/22 0447   08/17/22 1630  piperacillin-tazobactam (ZOSYN) IVPB 3.375 g  Status:  Discontinued        3.375 g 12.5 mL/hr over 240 Minutes Intravenous Every 8 hours 08/17/22 1540 08/27/22 1202   08/16/22 1500  piperacillin-tazobactam (ZOSYN) IVPB 3.375 g        3.375 g 100 mL/hr over 30 Minutes Intravenous STAT 08/16/22 1446 08/16/22 2032   08/16/22 0615  piperacillin-tazobactam (ZOSYN) IVPB 3.375 g        3.375 g 100 mL/hr over 30 Minutes Intravenous  Once 08/16/22 0609 08/16/22 0713        Assessment/Plan POD 15, s/p ex lap partial sigmoid colectomy, colostomy by Dr. Oda Cogan on 9/15 for Perforated stercoral ulcer - History of chronic pain on suboxone. Off PCA. Will schedule oxycontin today '15mg'$  BID and  5 mg oxycodone for breakthrough as his biggest complaints is everything wears off so quickly.  Will see if this helps with his pain control a bit better - Cont TPN - started 9/19 pm. try CLD. Output documented on I/O but no real stool output noted. He is having more gas. Stoma viable. Patent on digitization 9/27 w some hard stool noted. On a bowel regimen - CT w/ extraperitoneal fluid collection in LLQ lateral to psoas. S/p IR drain 9/23. Cx with E. Coli. Narrowed to Unasyn. Comp abx 5d after drainage (through 9/28). check labs in am - Surgical JP drain out 9/28 - Continue Mepitel at base of midline with wtd dressings over bid as we have been doing (order changed to Mepitel at base with wtd over BID 9/25).  - WOC RN following for new ostomy - Encourage  ambulation and IS use -if not further output from colostomy over weekend, will have [place] RN give enema through colostomy on Monday when they return    FEN - CLD, IVFs, TPN VTE - SCDs, Lovenox ID - zosyn 9/14 - 9/26. Unasyn 9/26 - 9/28. None currently. WBC 15.3 (19.3) Foley - out 9/18. voiding   Per primary Tobacco abuse/COPD - 2 ppd H/O substance abuse H/O opioid dependence - now on suboxone prn H/O seizure d/o Chronic pancreatitis Zollinger-Ellison syndrome GERD Hypoalbuminemic - appears malnourished at baseline due to muscle wasting RA - mtx and enbrel held   LOS: 15 days    Henreitta Cea , [facility] 08/31/2022, 9:45 AM Please see Amion for pager number during day hours 7:00am-4:30pm

## 2022-09-01 DIAGNOSIS — K631 Perforation of intestine (nontraumatic): Secondary | ICD-10-CM | POA: Diagnosis not present

## 2022-09-01 LAB — GLUCOSE, CAPILLARY
Glucose-Capillary: 110 mg/dL — ABNORMAL HIGH (ref 70–99)
Glucose-Capillary: 116 mg/dL — ABNORMAL HIGH (ref 70–99)
Glucose-Capillary: 117 mg/dL — ABNORMAL HIGH (ref 70–99)
Glucose-Capillary: 120 mg/dL — ABNORMAL HIGH (ref 70–99)
Glucose-Capillary: 123 mg/dL — ABNORMAL HIGH (ref 70–99)

## 2022-09-01 LAB — CBC
HCT: 29.3 % — ABNORMAL LOW (ref 39.0–52.0)
Hemoglobin: 9.6 g/dL — ABNORMAL LOW (ref 13.0–17.0)
MCH: 30.7 pg (ref 26.0–34.0)
MCHC: 32.8 g/dL (ref 30.0–36.0)
MCV: 93.6 fL (ref 80.0–100.0)
Platelets: 527 10*3/uL — ABNORMAL HIGH (ref 150–400)
RBC: 3.13 MIL/uL — ABNORMAL LOW (ref 4.22–5.81)
RDW: 14.5 % (ref 11.5–15.5)
WBC: 12.5 10*3/uL — ABNORMAL HIGH (ref 4.0–10.5)
nRBC: 0 % (ref 0.0–0.2)

## 2022-09-01 MED ORDER — FLEET ENEMA 7-19 GM/118ML RE ENEM: 1.0000 | ENEMA | Freq: Once | RECTAL | Status: DC

## 2022-09-01 MED ORDER — TRAVASOL 10 % IV SOLN
INTRAVENOUS | Status: AC
  Filled 2022-09-01: qty 998.4

## 2022-09-01 MED ORDER — FLEET ENEMA 7-19 GM/118ML RE ENEM
1.0000 | ENEMA | Freq: Once | RECTAL | Status: DC
  Filled 2022-09-01: qty 1

## 2022-09-01 NOTE — Progress Notes (Signed)
CT reviewed with Dr Earleen Newport regarding IR drain. Drain is still having daily output greater than 55m/day, so it is recommended to leave drain in place at this time. Current plan is to continue seeing while patient is in the hospital and schedule patient for drain injection and follow-up in IR clinic 7-10 days after discharge from hospital. Please make IR aware of any plans to discharge so that patient can be seen and orders put in for follow-up.

## 2022-09-01 NOTE — Progress Notes (Signed)
16 Days Post-Op  Subjective: Pain much better today with the addition of the oxycontin.  Was finally able to sleep.  Ate some FLD.  No colostomy output yet  Objective: Vital signs in last 24 hours: Temp:  [97.6 F (36.4 C)-98.6 F (37 C)] 98.6 F (37 C) (10/01 0738) Pulse Rate:  [92-104] 104 (10/01 0738) Resp:  [15-18] 15 (10/01 0738) BP: (108-134)/(74-85) 110/78 (10/01 0738) SpO2:  [97 %-99 %] 97 % (10/01 0738) Weight:  [50.2 kg] 50.2 kg (10/01 0533) Last BM Date : 09/29/22 (ostomy)  Intake/Output from previous day: 09/30 0701 - 10/01 0700 In: 130 [P.O.:120] Out: 1335 [Urine:1300; Drains:30; Stool:5] Intake/Output this shift: Total I/O In: -  Out: 330 [Urine:330]  PE: General: pleasant, WD, male who is laying in bed in NAD Lungs: Respiratory effort nonlabored Abd: Soft, no real distention. Colostomy with small amount of air and sweat in bag. Stoma pink and viable. Surgical JP drain site with dressing in place, cdi. IR drain with tan purulent output, 5cc/24 hours. Midline wound not evaluated today per patient request as this was just changed.   Lab Results:  Recent Labs    08/30/22 0440 09/01/22 0429  WBC 15.3* 12.5*  HGB 10.2* 9.6*  HCT 30.6* 29.3*  PLT 598* 527*   BMET Recent Labs    08/30/22 0440  NA 136  K 3.9  CL 105  CO2 23  GLUCOSE 134*  BUN 11  CREATININE 0.43*  CALCIUM 8.7*   PT/INR No results for input(s): "LABPROT", "INR" in the last 72 hours. CMP     Component Value Date/Time   NA 136 08/30/2022 0440   NA 140 10/12/2020 1526   K 3.9 08/30/2022 0440   CL 105 08/30/2022 0440   CO2 23 08/30/2022 0440   GLUCOSE 134 (H) 08/30/2022 0440   BUN 11 08/30/2022 0440   BUN 10 10/12/2020 1526   CREATININE 0.43 (L) 08/30/2022 0440   CREATININE 0.77 01/27/2013 1105   CALCIUM 8.7 (L) 08/30/2022 0440   PROT 7.3 08/29/2022 0308   PROT 7.5 10/12/2020 1526   ALBUMIN 2.2 (L) 08/29/2022 0308   ALBUMIN 4.5 10/12/2020 1526   AST 19 08/29/2022 0308    ALT 14 08/29/2022 0308   ALKPHOS 76 08/29/2022 0308   BILITOT 0.3 08/29/2022 0308   BILITOT 0.3 10/12/2020 1526   GFRNONAA >60 08/30/2022 0440   GFRAA 123 10/12/2020 1526   Lipase     Component Value Date/Time   LIPASE 39 08/16/2022 0506    Studies/Results: CT ABDOMEN PELVIS WO CONTRAST  Result Date: 08/30/2022 CLINICAL DATA:  Postop abdominal pain.  Bowel perforation. EXAM: CT ABDOMEN AND PELVIS WITHOUT CONTRAST TECHNIQUE: Multidetector CT imaging of the abdomen and pelvis was performed following the standard protocol without IV contrast. RADIATION DOSE REDUCTION: This exam was performed according to the departmental dose-optimization program which includes automated exposure control, adjustment of the mA and/or kV according to patient size and/or use of iterative reconstruction technique. COMPARISON:  08/23/2022. FINDINGS: Lower chest: The heart is normal in size and there is a small pericardial effusion. Patchy atelectasis or infiltrate is noted at the lung bases bilaterally, greater on the left than on the right. There is a 7 mm pleural-based nodule in the right lower lobe, axial image 11. Hepatobiliary: No focal liver abnormality is seen. No gallstones, gallbladder wall thickening, or biliary dilatation. Pancreas: Unremarkable. No pancreatic ductal dilatation or surrounding inflammatory changes. Spleen: Normal in size without focal abnormality. Adrenals/Urinary Tract: The adrenal  glands are within normal limits. There is a cyst in the upper pole of the right kidney. Cortical calcifications and scarring is noted on the right. An extrarenal pelvis is noted on the left. No hydroureteronephrosis bilaterally. The bladder is unremarkable. Stomach/Bowel: Surgical changes are noted at the stomach and in the epigastric region. There is a patulous loop of small bowel in the mid left abdomen at an anastomotic site. No bowel obstruction, free air, or pneumatosis. The patient is status post sigmoidectomy  with left lower quadrant colostomy. There is bowel wall thickening involving the distal descending colon at the colostomy site. The appendix is surgically absent. Vascular/Lymphatic: Aortic atherosclerosis. No enlarged abdominal or pelvic lymph nodes. Reproductive: Prostate is unremarkable. Other: Postsurgical changes are noted in the anterior abdominal wall. Small amount of fluid is noted in the left pericolic gutter. A surgical drain terminates in the pelvis in the left lower quadrant lateral to the psoas muscle. Minimal residual soft tissue thickening with a few foci of air identified at the drain tip in the previously described abscess is no longer seen. Musculoskeletal: No acute osseous abnormality. IMPRESSION: 1. Status post sigmoidectomy with left lower quadrant ileostomy. There is mild colonic wall thickening involving the distal descending colon which is likely related to postsurgical changes. No bowel obstruction. 2. Left lower quadrant pigtail catheter terminating lateral to the psoas muscle on the left. Minimal residual soft tissue density and air is noted. No drainable fluid collection is identified. 3. 7 mm right lower lobe pulmonary nodule. Non-contrast chest CT at 6-12 months is recommended. If the nodule is stable at time of repeat CT, then future CT at 18-24 months (from today's scan) is considered optional for low-risk patients, but is recommended for high-risk patients. This recommendation follows the consensus statement: Guidelines for Management of Incidental Pulmonary Nodules Detected on CT Images: From the Fleischner Society 2017; Radiology 2017; 284:228-243. 4. Patchy atelectasis or infiltrate at the lung bases bilaterally. 5. Aortic atherosclerosis. Electronically Signed   By: Brett Fairy M.D.   On: 08/30/2022 20:39    Anti-infectives: Anti-infectives (From admission, onward)    Start     Dose/Rate Route Frequency Ordered Stop   08/27/22 1300  Ampicillin-Sulbactam (UNASYN) 3 g in  sodium chloride 0.9 % 100 mL IVPB        3 g 200 mL/hr over 30 Minutes Intravenous Every 8 hours 08/27/22 1202 08/29/22 0447   08/17/22 1630  piperacillin-tazobactam (ZOSYN) IVPB 3.375 g  Status:  Discontinued        3.375 g 12.5 mL/hr over 240 Minutes Intravenous Every 8 hours 08/17/22 1540 08/27/22 1202   08/16/22 1500  piperacillin-tazobactam (ZOSYN) IVPB 3.375 g        3.375 g 100 mL/hr over 30 Minutes Intravenous STAT 08/16/22 1446 08/16/22 2032   08/16/22 0615  piperacillin-tazobactam (ZOSYN) IVPB 3.375 g        3.375 g 100 mL/hr over 30 Minutes Intravenous  Once 08/16/22 0609 08/16/22 0713        Assessment/Plan POD 16, s/p ex lap partial sigmoid colectomy, colostomy by Dr. Oda Cogan on 9/15 for Perforated stercoral ulcer - History of chronic pain on suboxone. Off PCA. Will schedule oxycontin today '15mg'$  BID and 5 mg oxycodone for breakthrough.  This has help significantly. - Cont TPN  -on FLD today and tolerating some, but doesn't want to be advanced. -WOC RN consult placed for fleet enema through colostomy for 10/2. - CT w/ extraperitoneal fluid collection in LLQ lateral to psoas. S/p  IR drain 9/23. Cx with E. Coli. Narrowed to Unasyn. Comp abx 5d after drainage (through 9/28). check labs in am - Surgical JP drain out 9/28 - Continue Mepitel at base of midline with wtd dressings over bid as we have been doing (order changed to Mepitel at base with wtd over BID 9/25).  - WOC RN following for new ostomy - Encourage ambulation and IS use    FEN - FLD, IVFs, TPN VTE - SCDs, Lovenox ID - zosyn 9/14 - 9/26. Unasyn 9/26 - 9/28. None currently. WBC 15.3 (19.3) Foley - out 9/18. voiding   Per primary Tobacco abuse/COPD - 2 ppd H/O substance abuse H/O opioid dependence - now on suboxone prn H/O seizure d/o Chronic pancreatitis Zollinger-Ellison syndrome GERD Hypoalbuminemic - appears malnourished at baseline due to muscle wasting RA - mtx and enbrel held   LOS: 16 days     Henreitta Cea , [facility] 09/01/2022, 12:26 PM Please see Amion for pager number during day hours 7:00am-4:30pm

## 2022-09-01 NOTE — Progress Notes (Signed)
Mobility Specialist: Progress Note   09/01/22 1649  Mobility  Activity Refused mobility   Pt refused mobility stating he has already walked today and plans to walk later this evening after he finishes watching the race. Despite encouragement pt still declining at this time. Will f/u as able.   [facility] Malaysha Arlen Mobility Specialist Mobility Specialist [address]: [phone]

## 2022-09-01 NOTE — Progress Notes (Addendum)
[facility]                                                                              Jerry Knight, is a 61 y.o. male, DOB - 1961-03-24, UJW:[id number] Admit date - 08/16/2022    Outpatient Primary MD for the patient is Irwin Brakeman [name], [place]  LOS - 16  days  Chief Complaint  Patient presents with   Abdominal Pain       Brief summary   Jerry Knight is a 61 y.o. male with medical history significant of  COPD, chronic pancreatitis, rheumatoid arthritis on Enbrel, history of seizures not on an AED, chronic pain on Suboxone, polysubstance abuse, Zollinger-Ellison syndrome, and constipation who presented with complaints of  lower abdominal pain.  Imaging remarkable for colonic perforation/obstruction  Patient was admitted for further work-up   Assessment & Plan    Principal Problem: Sepsis secondary to bowel perforation ([place]) due to stercoral ulcer/stool impaction, left  retroperitoneal abscess --Met sepsis criteria on admission with tachycardia, tachypnea, leukocytosis, lactic acidosis - underwent emergent exploratory laparotomy, left colostomy and partial sigmoid colectomy on 9/15 -Repeat CT on 9/22 with new 7 cm fluid collection in the LLQ s/p drain placement by IR on 9/23, drain cultures showing abundant E. Coli, -finished abx treatment (zosyn 9/14 - 9/26. Unasyn 9/26 - 9/28), due to drain output> 10cc per 24hs, IR recommend to  follow-up in IR clinic 7-10 days after discharge from hospital for drain injection -on TPN, ambulating , diet order per gen surg -does not want to eat, no stool output from colostomy except scan liquid -repeat CT on 9/29, plan per surgery  Acute on chronic pain  -Outpatient on Suboxone -Off the PCA now complaining of uncontrolled pain and abdominal cramps last night -Continue oral oxycodone PRN and morphine  IV PRN for breakthrough pain.  - continue tizanidine, continue Lidoderm patch as needed   Normocytic anemia -Likely  anemia of chronic disease -HTN stable 10.2  Hypokalemia, hyponatremia -Continue TPN  COPD, without exacerbation - Chest x-ray with chronic interstitial lung changes with bibasilar atelectasis. -Stable, no wheezing, continue albuterol nebs as needed   History of rheumatoid arthritis -Outpatient on Enbrel 50 mg and methotrexate 10 mg on Saturdays -Currently regimen on hold  Insomnia, anxiety -For now continue Ativan as needed for anxiety -Continue trazodone 150 mg at bedtime    GERD -Continue PPI    Tobacco abuse Patient reportedly smokes 2 packs of cigarettes per day on average. - cont Nicotine patch  7 mm right lower lobe pulmonary nodule. Non-contrast chest CT at 6-12 months is recommended. If the nodule is stable at time of repeat CT, then future CT at 18-24 months (from today's scan) is considered optional for low-risk patients, but is recommended for high-risk patients  Moderate to severe protein calorie malnutrition due to acute illness, hypoalbuminemia 1.9, muscle wasting Estimated body mass index is 18.14 kg/m as calculated from the following:   Height as of this encounter: 5' 5.5" (1.664 m).   Weight as of this encounter: 50.2 kg. -Currently on TPN  Code Status: Full code DVT Prophylaxis:  enoxaparin (LOVENOX) injection 40  mg Start: 08/25/22 1000 SCD's Start: 08/16/22 1754 SCDs Start: 08/16/22 1600   Level of Care: Level of care: Telemetry Surgical Family Communication:  wife updated at the bedside on 9/25, patient alert and oriented.  Disposition Plan:       Not stable for discharge until cleared by general surgery   Procedures:  9/15 : ex lap partial sigmoid colectomy, colostomy  9/23: Drain into left retroperitoneal abscess with 140 mL purulent fluid aspirated  Consultants:   General surgery Interventional radiology  Antimicrobials:   Anti-infectives (From admission, onward)    Start     Dose/Rate Route Frequency Ordered Stop   08/27/22 1300   Ampicillin-Sulbactam (UNASYN) 3 g in sodium chloride 0.9 % 100 mL IVPB        3 g 200 mL/hr over 30 Minutes Intravenous Every 8 hours 08/27/22 1202 08/29/22 0447   08/17/22 1630  piperacillin-tazobactam (ZOSYN) IVPB 3.375 g  Status:  Discontinued        3.375 g 12.5 mL/hr over 240 Minutes Intravenous Every 8 hours 08/17/22 1540 08/27/22 1202   08/16/22 1500  piperacillin-tazobactam (ZOSYN) IVPB 3.375 g        3.375 g 100 mL/hr over 30 Minutes Intravenous STAT 08/16/22 1446 08/16/22 2032   08/16/22 0615  piperacillin-tazobactam (ZOSYN) IVPB 3.375 g        3.375 g 100 mL/hr over 30 Minutes Intravenous  Once 08/16/22 0609 08/16/22 0713          Medications  acetaminophen  1,000 mg Oral Q6H   atorvastatin  20 mg Oral Daily   Chlorhexidine Gluconate Cloth  6 each Topical Daily   docusate sodium  100 mg Oral BID   enoxaparin (LOVENOX) injection  40 mg Subcutaneous Q24H   feeding supplement  237 mL Oral TID BM   lidocaine  1 patch Transdermal Q24H   nicotine  21 mg Transdermal QHS   oxyCODONE  15 mg Oral Q12H   pantoprazole  40 mg Oral Daily   polyethylene glycol  17 g Oral BID   sodium chloride flush  3 mL Intravenous Q12H   sodium chloride flush  5 mL Intracatheter Q8H   tiZANidine  2 mg Oral Q8H   traZODone  150 mg Oral QHS      Subjective:   Jerry Knight was seen and examined today. No acute event overnight reported He denies acute changes, still no significant colostomy output , drain to left ab, 30cc output documented last 24hrs  Tolerating TPN Wbc trending down On full liquid diet Reports he Ambulates in hallway without issue  Objective:   Vitals:   08/31/22 1554 08/31/22 2032 09/01/22 0533 09/01/22 0738  BP: 132/77 134/85 108/74 110/78  Pulse: (!) 102 99 92 (!) 104  Resp: 18 17 17 15   Temp: 98.6 F (37 C) 97.6 F (36.4 C) 98.5 F (36.9 C) 98.6 F (37 C)  TempSrc:  Oral Oral Oral  SpO2: 98% 99% 98% 97%  Weight:   50.2 kg   Height:         Intake/Output Summary (Last 24 hours) at 09/01/2022 0812 Last data filed at 09/01/2022 0657 Gross per 24 hour  Intake 130 ml  Output 1335 ml  Net -1205 ml     Wt Readings from Last 3 Encounters:  09/01/22 50.2 kg  07/27/22 53.5 kg  11/09/21 52.2 kg   Physical Exam General: Alert and oriented x 3, NAD, thin, malnourished  Cardiovascular: S1 S2 clear, RRR.  Respiratory: CTAB, no wheezing  Gastrointestinal: Soft, stoma pink and viable, dressing intact midline, small amount brown liquid output in ostomy Ext: no pedal edema bilaterally Neuro: no new deficits Psych: calm and cooperative today  Data Reviewed:  I have personally reviewed following labs    CBC Lab Results  Component Value Date   WBC 12.5 (H) 09/01/2022   RBC 3.13 (L) 09/01/2022   HGB 9.6 (L) 09/01/2022   HCT 29.3 (L) 09/01/2022   MCV 93.6 09/01/2022   MCH 30.7 09/01/2022   PLT 527 (H) 09/01/2022   MCHC 32.8 09/01/2022   RDW 14.5 09/01/2022   LYMPHSABS 1.1 08/16/2022   MONOABS 1.4 (H) 08/16/2022   EOSABS 0.0 08/16/2022   BASOSABS 0.0 16/09/9603     Last metabolic panel Lab Results  Component Value Date   NA 136 08/30/2022   K 3.9 08/30/2022   CL 105 08/30/2022   CO2 23 08/30/2022   BUN 11 08/30/2022   CREATININE 0.43 (L) 08/30/2022   GLUCOSE 134 (H) 08/30/2022   GFRNONAA >60 08/30/2022   GFRAA 123 10/12/2020   CALCIUM 8.7 (L) 08/30/2022   PHOS 3.4 08/30/2022   PROT 7.3 08/29/2022   ALBUMIN 2.2 (L) 08/29/2022   LABGLOB 3.0 10/12/2020   AGRATIO 1.5 10/12/2020   BILITOT 0.3 08/29/2022   ALKPHOS 76 08/29/2022   AST 19 08/29/2022   ALT 14 08/29/2022   ANIONGAP 8 08/30/2022    CBG (last 3)  Recent Labs    08/31/22 1553 09/01/22 0030 09/01/22 0536  GLUCAP 128* 120* 117*      Coagulation Profile: No results for input(s): "INR", "PROTIME" in the last 168 hours.    Radiology Studies: I have personally reviewed the imaging studies  CT ABDOMEN PELVIS WO CONTRAST  Result Date:  08/30/2022 CLINICAL DATA:  Postop abdominal pain.  Bowel perforation. EXAM: CT ABDOMEN AND PELVIS WITHOUT CONTRAST TECHNIQUE: Multidetector CT imaging of the abdomen and pelvis was performed following the standard protocol without IV contrast. RADIATION DOSE REDUCTION: This exam was performed according to the departmental dose-optimization program which includes automated exposure control, adjustment of the mA and/or kV according to patient size and/or use of iterative reconstruction technique. COMPARISON:  08/23/2022. FINDINGS: Lower chest: The heart is normal in size and there is a small pericardial effusion. Patchy atelectasis or infiltrate is noted at the lung bases bilaterally, greater on the left than on the right. There is a 7 mm pleural-based nodule in the right lower lobe, axial image 11. Hepatobiliary: No focal liver abnormality is seen. No gallstones, gallbladder wall thickening, or biliary dilatation. Pancreas: Unremarkable. No pancreatic ductal dilatation or surrounding inflammatory changes. Spleen: Normal in size without focal abnormality. Adrenals/Urinary Tract: The adrenal glands are within normal limits. There is a cyst in the upper pole of the right kidney. Cortical calcifications and scarring is noted on the right. An extrarenal pelvis is noted on the left. No hydroureteronephrosis bilaterally. The bladder is unremarkable. Stomach/Bowel: Surgical changes are noted at the stomach and in the epigastric region. There is a patulous loop of small bowel in the mid left abdomen at an anastomotic site. No bowel obstruction, free air, or pneumatosis. The patient is status post sigmoidectomy with left lower quadrant colostomy. There is bowel wall thickening involving the distal descending colon at the colostomy site. The appendix is surgically absent. Vascular/Lymphatic: Aortic atherosclerosis. No enlarged abdominal or pelvic lymph nodes. Reproductive: Prostate is unremarkable. Other: Postsurgical changes  are noted in the anterior abdominal wall. Small amount of fluid is noted in the  left pericolic gutter. A surgical drain terminates in the pelvis in the left lower quadrant lateral to the psoas muscle. Minimal residual soft tissue thickening with a few foci of air identified at the drain tip in the previously described abscess is no longer seen. Musculoskeletal: No acute osseous abnormality. IMPRESSION: 1. Status post sigmoidectomy with left lower quadrant ileostomy. There is mild colonic wall thickening involving the distal descending colon which is likely related to postsurgical changes. No bowel obstruction. 2. Left lower quadrant pigtail catheter terminating lateral to the psoas muscle on the left. Minimal residual soft tissue density and air is noted. No drainable fluid collection is identified. 3. 7 mm right lower lobe pulmonary nodule. Non-contrast chest CT at 6-12 months is recommended. If the nodule is stable at time of repeat CT, then future CT at 18-24 months (from today's scan) is considered optional for low-risk patients, but is recommended for high-risk patients. This recommendation follows the consensus statement: Guidelines for Management of Incidental Pulmonary Nodules Detected on CT Images: From the Fleischner Society 2017; Radiology 2017; 284:228-243. 4. Patchy atelectasis or infiltrate at the lung bases bilaterally. 5. Aortic atherosclerosis. Electronically Signed   By: Brett Fairy M.D.   On: 08/30/2022 20:39       Florencia Reasons M.D. PhD FACP [facility] 09/01/2022, 8:12 AM  Available via Epic secure chat 7am-7pm After 7 pm, please refer to night coverage provider listed on amion.

## 2022-09-01 NOTE — Progress Notes (Signed)
PHARMACY - TOTAL PARENTERAL NUTRITION CONSULT NOTE   Indication: Prolonged ileus  Patient Measurements: Height: 5' 5.5" (166.4 cm) Weight: 50.2 kg (110 lb 10.7 oz) IBW/kg (Calculated) : 62.65 TPN AdjBW (KG): 52.6 Body mass index is 18.14 kg/m.  Usual Weight: 115 lbs  Assessment:  61 yo male with a history of chronic pain syndrome on Suboxone, chronic pancreatitis with prior surgery for pseudocyst removal in the 1990s, prior BII in the 1980s, COPD, rheumatoid arthritis on Enbrel, history of seizures not on an AED, and Zollinger-Ellison syndrome who presented with acute onset of abdominal pain along with N/V. He was found to have a WBC of 22K, lactic acid of 3.1 and a CT scan concerning for a colonic perforation. Pharmacy consulted to initiate TPN for prolonged ileus.  Glucose / Insulin: no hx DM, CBGs controlled   Electrolytes: last labs 9/29: all stable WNL  Renal: Scr 0.43 stable (bsl < 1); BUN wnl Hepatic: LFTs / Tbili / TG wnl. Albumin 2.2 Intake / Output; MIVF: UOP 1.1 ml/kg/hr; R JP drain removed 9/28. L JP drain 30 ml. 5 ml colostomy output GI Imaging: 9/15 colonic perforation in the region of the sigmoid colon likely secondary to impacted stool 9/29 CT abd/pelvis: no obstruction or drainable fluid GI Surgeries / Procedures:  9/15 Exploratory laparotomy, Left colostomy and partial sigmoid colectomy  9/23: drain placed for L retroperitoneal abscess (132m fluid aspirated)  Central access: 08/20/22 TPN start date:  08/20/22  Nutritional Goals: Goal TPN rate is 80 mL/hr (provides 100 g of protein and 1993 kcals per day)  RD Assessment: Estimated Needs Total Energy Estimated Needs: 1900-2100 kcal/d Total Protein Estimated Needs: 90-110g/d Total Fluid Estimated Needs: >/= 1.7 L   Current Nutrition:  TPN Trial CLD again 9/30 Ensure TID - none charted as given yesterday  Plan:  Continue TPN at goal rate 860mhr TPN will provide 100g protein and 1993 kcal, meeting 100% of  patient needs Electrolytes in TPN: Na 150 mEq/L, K 45 mEq/L, Ca 2 mEq/L Mg 9 mEq/L, phos 16 mmol/L. Cl:Ac 1:1 Add standard MVI and trace elements to TPN Monitor CBGs - continue off SSI with CBGs controlled Monitor standard TPN labs on Mon/Thurs and as needed Follow up toleration and advancement of diet and colostomy output   Thank you for allowing pharmacy to be a part of this patient's care.  ElAlycia RossettiPharmD, BCPS Infectious Diseases Clinical Pharmacist 09/01/2022 11:13 AM   **Pharmacist phone directory can now be found on [facility] ([id number]).  Listed under [facility]

## 2022-09-01 NOTE — Plan of Care (Signed)
  Problem: Education: Goal: Knowledge of General Education information will improve Description Including pain rating scale, medication(s)/side effects and non-pharmacologic comfort measures Outcome: Progressing   Problem: Health Behavior/Discharge Planning: Goal: Ability to manage health-related needs will improve Outcome: Progressing   

## 2022-09-02 DIAGNOSIS — K631 Perforation of intestine (nontraumatic): Secondary | ICD-10-CM | POA: Diagnosis not present

## 2022-09-02 LAB — COMPREHENSIVE METABOLIC PANEL
ALT: 14 U/L (ref 0–44)
AST: 15 U/L (ref 15–41)
Albumin: 2.5 g/dL — ABNORMAL LOW (ref 3.5–5.0)
Alkaline Phosphatase: 72 U/L (ref 38–126)
Anion gap: 6 (ref 5–15)
BUN: 14 mg/dL (ref 6–20)
CO2: 26 mmol/L (ref 22–32)
Calcium: 8.7 mg/dL — ABNORMAL LOW (ref 8.9–10.3)
Chloride: 104 mmol/L (ref 98–111)
Creatinine, Ser: 0.48 mg/dL — ABNORMAL LOW (ref 0.61–1.24)
GFR, Estimated: >60 mL/min (ref >60)
Glucose, Bld: 123 mg/dL — ABNORMAL HIGH (ref 70–99)
Potassium: 4.1 mmol/L (ref 3.5–5.1)
Sodium: 136 mmol/L (ref 135–145)
Total Bilirubin: 0.2 mg/dL — ABNORMAL LOW (ref 0.3–1.2)
Total Protein: 7.8 g/dL (ref 6.5–8.1)

## 2022-09-02 LAB — MAGNESIUM: Magnesium: 1.8 mg/dL (ref 1.7–2.4)

## 2022-09-02 LAB — GLUCOSE, CAPILLARY
Glucose-Capillary: 118 mg/dL — ABNORMAL HIGH (ref 70–99)
Glucose-Capillary: 116 mg/dL — ABNORMAL HIGH (ref 70–99)
Glucose-Capillary: 127 mg/dL — ABNORMAL HIGH (ref 70–99)

## 2022-09-02 LAB — TRIGLYCERIDES: Triglycerides: 86 mg/dL (ref <150)

## 2022-09-02 LAB — PHOSPHORUS: Phosphorus: 3.8 mg/dL (ref 2.5–4.6)

## 2022-09-02 MED ORDER — TRAVASOL 10 % IV SOLN
INTRAVENOUS | Status: AC
  Filled 2022-09-02: qty 998.4

## 2022-09-02 NOTE — Progress Notes (Signed)
[facility]                                                                              Jerry Knight, is a 61 y.o. male, DOB - [birth year], GYB:[id number] Admit date - 08/16/2022    Outpatient Primary MD for the patient is Irwin Brakeman [name], [place]  LOS - 17  days  Chief Complaint  Patient presents with   Abdominal Pain       Brief summary   Jerry Knight is a 61 y.o. male with medical history significant of  COPD, chronic pancreatitis, rheumatoid arthritis on Enbrel, history of seizures not on an AED, chronic pain on Suboxone, polysubstance abuse, Zollinger-Ellison syndrome, and constipation who presented with complaints of  lower abdominal pain.  Imaging remarkable for colonic perforation/obstruction  Patient was admitted for further work-up   Assessment & Plan    Principal Problem: Sepsis secondary to bowel perforation ([place]) due to stercoral ulcer/stool impaction, left  retroperitoneal abscess --Met sepsis criteria on admission with tachycardia, tachypnea, leukocytosis, lactic acidosis - underwent emergent exploratory laparotomy, left colostomy and partial sigmoid colectomy on 9/15 -s/p LLQ s/p drain placement for post op fluids collection by IR on 9/23, drain cultures showing abundant E. Coli, -finished abx treatment (zosyn 9/14 - 9/26. Unasyn 9/26 - 9/28),   Drain care per IR: flush drain QD with 5 cc NS, record output QD, dressing changes every 2-3 days or earlier if soiled. due to drain output continue to > 10cc per 24hs, IR recommend to  follow-up in IR clinic 7-10 days after discharge from hospital for drain injection -on TPN, ambulating , diet order per gen surg - no stool output from colostomy except scan liquid, s/p enema through ostomy on 10/2 for constipation -plan per gen surg  Acute on chronic pain  -Outpatient on Suboxone -Off the PCA now  -currently on oxycontin 67m bid, Zanaflex, still requiring frequent as needed IV and oral  narcotics    Normocytic anemia -Likely anemia of chronic disease -HTN stable 9-10  Hypokalemia, hyponatremia -Continue TPN  COPD, without exacerbation - Chest x-ray with chronic interstitial lung changes with bibasilar atelectasis. -Stable, no wheezing, continue albuterol nebs as needed   History of rheumatoid arthritis -Outpatient on Enbrel 50 mg and methotrexate 10 mg on Saturdays -Currently regimen on hold  Insomnia, anxiety -For now continue Ativan as needed for anxiety -Continue trazodone 150 mg at bedtime    GERD -Continue PPI    Tobacco abuse Patient reportedly smokes 2 packs of cigarettes per day on average. - cont Nicotine patch  7 mm right lower lobe pulmonary nodule. Non-contrast chest CT at 6-12 months is recommended. If the nodule is stable at time of repeat CT, then future CT at 18-24 months (from today's scan) is considered optional for low-risk patients, but is recommended for high-risk patients  Moderate to severe protein calorie malnutrition due to acute illness, hypoalbuminemia 1.9, muscle wasting Estimated body mass index is 16.88 kg/m as calculated from the following:   Height as of this encounter: 5' 5.5" (1.664 m).   Weight as of this encounter: 46.7 kg. -Currently on TPN  Code  Status: Full code DVT Prophylaxis:  enoxaparin (LOVENOX) injection 40 mg Start: 08/25/22 1000 SCD's Start: 08/16/22 1754 SCDs Start: 08/16/22 1600   Level of Care: Level of care: Telemetry Surgical Family Communication:  wife updated at the bedside on 10/2  Disposition Plan:       Not stable for discharge until cleared by general surgery   Procedures:  9/15 : ex lap partial sigmoid colectomy, colostomy  9/23: Drain into left retroperitoneal abscess with 140 mL purulent fluid aspirated  Consultants:   General surgery Interventional radiology  Antimicrobials:   Anti-infectives (From admission, onward)    Start     Dose/Rate Route Frequency Ordered Stop    08/27/22 1300  Ampicillin-Sulbactam (UNASYN) 3 g in sodium chloride 0.9 % 100 mL IVPB        3 g 200 mL/hr over 30 Minutes Intravenous Every 8 hours 08/27/22 1202 08/29/22 0447   08/17/22 1630  piperacillin-tazobactam (ZOSYN) IVPB 3.375 g  Status:  Discontinued        3.375 g 12.5 mL/hr over 240 Minutes Intravenous Every 8 hours 08/17/22 1540 08/27/22 1202   08/16/22 1500  piperacillin-tazobactam (ZOSYN) IVPB 3.375 g        3.375 g 100 mL/hr over 30 Minutes Intravenous STAT 08/16/22 1446 08/16/22 2032   08/16/22 0615  piperacillin-tazobactam (ZOSYN) IVPB 3.375 g        3.375 g 100 mL/hr over 30 Minutes Intravenous  Once 08/16/22 0609 08/16/22 0713          Medications  acetaminophen  1,000 mg Oral Q6H   atorvastatin  20 mg Oral Daily   Chlorhexidine Gluconate Cloth  6 each Topical Daily   docusate sodium  100 mg Oral BID   enoxaparin (LOVENOX) injection  40 mg Subcutaneous Q24H   feeding supplement  237 mL Oral TID BM   lidocaine  1 patch Transdermal Q24H   nicotine  21 mg Transdermal QHS   oxyCODONE  15 mg Oral Q12H   pantoprazole  40 mg Oral Daily   polyethylene glycol  17 g Oral BID   sodium chloride flush  3 mL Intravenous Q12H   sodium chloride flush  5 mL Intracatheter Q8H   sodium phosphate  1 enema Rectal Once   tiZANidine  2 mg Oral Q8H   traZODone  150 mg Oral QHS      Subjective:   Jerry Knight was seen and examined today. No acute event overnight reported He denies acute changes, still no significant colostomy output , drain to left ab, 10cc output documented last 24hrs  Tolerating TPN Wbc trending down Reports he Ambulates in hallway without issue Wife at bedside   Objective:   Vitals:   09/02/22 0439 09/02/22 0500 09/02/22 0751 09/02/22 1706  BP: 118/75  114/75 117/82  Pulse: 97  97 (!) 110  Resp: 18  17 18   Temp: 98.1 F (36.7 C)  98.2 F (36.8 C) 99.1 F (37.3 C)  TempSrc: Oral  Oral Oral  SpO2: 97%  98% 98%  Weight:  46.7 kg    Height:         Intake/Output Summary (Last 24 hours) at 09/02/2022 1903 Last data filed at 09/02/2022 1829 Gross per 24 hour  Intake 1989.55 ml  Output 1645 ml  Net 344.55 ml     Wt Readings from Last 3 Encounters:  09/02/22 46.7 kg  07/27/22 53.5 kg  11/09/21 52.2 kg   Physical Exam General: Alert and oriented x 3, NAD, thin, malnourished  Cardiovascular: S1 S2 clear, RRR.  Respiratory: CTAB, no wheezing Gastrointestinal: Soft, stoma pink and viable, dressing intact midline, small amount brown liquid output in ostomy, drain to left abdomen  Ext: no pedal edema bilaterally Neuro: no new deficits Psych: calm and cooperative today  Data Reviewed:  I have personally reviewed following labs    CBC Lab Results  Component Value Date   WBC 12.5 (H) 09/01/2022   RBC 3.13 (L) 09/01/2022   HGB 9.6 (L) 09/01/2022   HCT 29.3 (L) 09/01/2022   MCV 93.6 09/01/2022   MCH 30.7 09/01/2022   PLT 527 (H) 09/01/2022   MCHC 32.8 09/01/2022   RDW 14.5 09/01/2022   LYMPHSABS 1.1 08/16/2022   MONOABS 1.4 (H) 08/16/2022   EOSABS 0.0 08/16/2022   BASOSABS 0.0 57/12/7791     Last metabolic panel Lab Results  Component Value Date   NA 136 09/02/2022   K 4.1 09/02/2022   CL 104 09/02/2022   CO2 26 09/02/2022   BUN 14 09/02/2022   CREATININE 0.48 (L) 09/02/2022   GLUCOSE 123 (H) 09/02/2022   GFRNONAA >60 09/02/2022   GFRAA 123 10/12/2020   CALCIUM 8.7 (L) 09/02/2022   PHOS 3.8 09/02/2022   PROT 7.8 09/02/2022   ALBUMIN 2.5 (L) 09/02/2022   LABGLOB 3.0 10/12/2020   AGRATIO 1.5 10/12/2020   BILITOT 0.2 (L) 09/02/2022   ALKPHOS 72 09/02/2022   AST 15 09/02/2022   ALT 14 09/02/2022   ANIONGAP 6 09/02/2022    CBG (last 3)  Recent Labs    09/02/22 0618 09/02/22 1155 09/02/22 1817  GLUCAP 118* 116* 127*      Coagulation Profile: No results for input(s): "INR", "PROTIME" in the last 168 hours.    Radiology Studies: I have personally reviewed the imaging studies  No results  found.     Florencia Reasons M.D. PhD FACP [facility] 09/02/2022, 7:03 PM  Available via Epic secure chat 7am-7pm After 7 pm, please refer to night coverage provider listed on amion.

## 2022-09-02 NOTE — Progress Notes (Signed)
Mobility Specialist - Progress Note   09/02/22 1446  Mobility  Activity Refused mobility    Pt stated he had already walked twice today and that he is not feeling comfortable. Will f/u as time allows.   Paulla Dolly Mobility Specialist

## 2022-09-02 NOTE — Progress Notes (Addendum)
PHARMACY - TOTAL PARENTERAL NUTRITION CONSULT NOTE   Indication: Prolonged ileus  Patient Measurements: Height: 5' 5.5" (166.4 cm) Weight: 46.7 kg (103 lb) IBW/kg (Calculated) : 62.65 TPN AdjBW (KG): 52.6 Body mass index is 16.88 kg/m.  Usual Weight: 115 lbs  Assessment:  61 yo male with a history of chronic pain syndrome on Suboxone, chronic pancreatitis with prior surgery for pseudocyst removal in the 1990s, prior BII in the 1980s, COPD, rheumatoid arthritis on Enbrel, history of seizures not on an AED, and Zollinger-Ellison syndrome who presented with acute onset of abdominal pain along with N/V. He was found to have a WBC of 22K, lactic acid of 3.1 and a CT scan concerning for a colonic perforation. Pharmacy consulted to initiate TPN for prolonged ileus.  Glucose / Insulin: no hx DM, CBGs controlled   Electrolytes:  all stable WNL  Renal: Scr 0.49 stable (bsl < 1); BUN wnl Hepatic: LFTs / Tbili / TG wnl. Albumin 2.2 Intake / Output; MIVF: UOP 1.5 ml/kg/hr; R JP drain removed 9/28. L JP drain 10 ml, 0 ml colostomy output GI Imaging: 9/15 colonic perforation in the region of the sigmoid colon likely secondary to impacted stool 9/29 CT abd/pelvis: no obstruction or drainable fluid GI Surgeries / Procedures:  9/15 Exploratory laparotomy, Left colostomy and partial sigmoid colectomy  9/23: drain placed for L retroperitoneal abscess (149m fluid aspirated)  Central access: 08/20/22 TPN start date:  08/20/22  Nutritional Goals: Goal TPN rate is 80 mL/hr (provides 100 g of protein and 1993 kcals per day)  RD Assessment: Estimated Needs Total Energy Estimated Needs: 1900-2100 kcal/d Total Protein Estimated Needs: 90-110g/d Total Fluid Estimated Needs: >/= 1.7 L   Current Nutrition:  TPN Trial CLD again 9/30 Ensure TID - none charted as given yesterday  Plan:  Continue TPN at goal rate 820mhr TPN will provide 100g protein and 1993 kcal, meeting 100% of patient  needs Electrolytes in TPN: Na 150 mEq/L, K 45 mEq/L, Ca 2 mEq/L. Incr Mg 10 mEq/L, phos 16 mmol/L. Cl:Ac 1:1 Add standard MVI and trace elements to TPN Monitor CBGs - continue off SSI with CBGs controlled Monitor standard TPN labs on Mon/Thurs and as needed Follow up toleration and advancement of diet and colostomy output   Thank you for allowing pharmacy to be a part of this patient's care.  CaAlanda SlimPharmD, FCEps Surgical Center LLClinical Pharmacist Please see AMION for all Pharmacists' Contact Phone Numbers 09/02/2022, 8:16 AM

## 2022-09-02 NOTE — Progress Notes (Signed)
17 Days Post-Op  Subjective: CC: Tolerated cream of wheat and jello yesterday x 2 with some nausea, no vomiting.  No enema yesterday or today yet. Small amount of thin, tan output in bag.  Mobilizing more. Voiding.  Still some left sided abdominal pain. Medication adjustments have helped.  Wife at bedside.   Objective: Vital signs in last 24 hours: Temp:  [98 F (36.7 C)-98.4 F (36.9 C)] 98.2 F (36.8 C) (10/02 0751) Pulse Rate:  [95-99] 97 (10/02 0751) Resp:  [16-18] 17 (10/02 0751) BP: (114-130)/(72-88) 114/75 (10/02 0751) SpO2:  [97 %-98 %] 98 % (10/02 0751) Weight:  [46.7 kg] 46.7 kg (10/02 0500) Last BM Date : 08/30/22  Intake/Output from previous day: 10/01 0701 - 10/02 0700 In: 125 [P.O.:120] Out: 1740 [Urine:1730; Drains:10] Intake/Output this shift: No intake/output data recorded.  PE: General: pleasant, WD, male who is laying in bed in NAD Lungs: Respiratory effort nonlabored Abd: Soft, no real distention. Colostomy with small amount of air and sweat in bag. Stoma pink and viable. Surgical JP drain site with dressing in place, cdi. IR drain with tan purulent output, 10cc/24 hours. Midline wound with separation as noted on prior pictures/exam. Most of the wound with layer of granulation tissue overlying and less fibrinous exudate compared to prior     Lab Results:  Recent Labs    09/01/22 0429  WBC 12.5*  HGB 9.6*  HCT 29.3*  PLT 527*   BMET Recent Labs    09/02/22 0423  NA 136  K 4.1  CL 104  CO2 26  GLUCOSE 123*  BUN 14  CREATININE 0.48*  CALCIUM 8.7*   PT/INR No results for input(s): "LABPROT", "INR" in the last 72 hours. CMP     Component Value Date/Time   NA 136 09/02/2022 0423   NA 140 10/12/2020 1526   K 4.1 09/02/2022 0423   CL 104 09/02/2022 0423   CO2 26 09/02/2022 0423   GLUCOSE 123 (H) 09/02/2022 0423   BUN 14 09/02/2022 0423   BUN 10 10/12/2020 1526   CREATININE 0.48 (L) 09/02/2022 0423   CREATININE 0.77 01/27/2013  1105   CALCIUM 8.7 (L) 09/02/2022 0423   PROT 7.8 09/02/2022 0423   PROT 7.5 10/12/2020 1526   ALBUMIN 2.5 (L) 09/02/2022 0423   ALBUMIN 4.5 10/12/2020 1526   AST 15 09/02/2022 0423   ALT 14 09/02/2022 0423   ALKPHOS 72 09/02/2022 0423   BILITOT 0.2 (L) 09/02/2022 0423   BILITOT 0.3 10/12/2020 1526   GFRNONAA >60 09/02/2022 0423   GFRAA 123 10/12/2020 1526   Lipase     Component Value Date/Time   LIPASE 39 08/16/2022 0506    Studies/Results: No results found.  Anti-infectives: Anti-infectives (From admission, onward)    Start     Dose/Rate Route Frequency Ordered Stop   08/27/22 1300  Ampicillin-Sulbactam (UNASYN) 3 g in sodium chloride 0.9 % 100 mL IVPB        3 g 200 mL/hr over 30 Minutes Intravenous Every 8 hours 08/27/22 1202 08/29/22 0447   08/17/22 1630  piperacillin-tazobactam (ZOSYN) IVPB 3.375 g  Status:  Discontinued        3.375 g 12.5 mL/hr over 240 Minutes Intravenous Every 8 hours 08/17/22 1540 08/27/22 1202   08/16/22 1500  piperacillin-tazobactam (ZOSYN) IVPB 3.375 g        3.375 g 100 mL/hr over 30 Minutes Intravenous STAT 08/16/22 1446 08/16/22 2032   08/16/22 0615  piperacillin-tazobactam (ZOSYN) IVPB 3.375 g  3.375 g 100 mL/hr over 30 Minutes Intravenous  Once 08/16/22 0609 08/16/22 0713        Assessment/Plan POD 17, s/p ex lap partial sigmoid colectomy, colostomy by Dr. Oda Cogan on 9/15 for Perforated stercoral ulcer - History of chronic pain on suboxone. Off PCA. On scheduled oxycontin today '15mg'$  BID and 5 mg oxycodone for breakthrough.  This has help significantly. - Cont TPN  - Adv soft diet -WOC RN consult for enema through colostomy for 10/2. - CT w/ extraperitoneal fluid collection in LLQ lateral to psoas. S/p IR drain 9/23. Cx with E. Coli. Narrowed to Unasyn. Comp abx 5d after drainage (through 9/28). CT 9/29 with improvement. No new intra-abdominal abscess   - Surgical JP drain out 9/28 - Continue Mepitel at base of midline with  wtd dressings over bid  - WOC RN following for new ostomy - Encourage ambulation and IS use   FEN - Soft, IVFs, TPN VTE - SCDs, Lovenox ID - zosyn 9/14 - 9/26. Unasyn 9/26 - 9/28. None currently. WBC 12.5 Foley - out 9/18. voiding   Per primary Tobacco abuse/COPD - 2 ppd H/O substance abuse H/O opioid dependence - now on suboxone prn H/O seizure d/o Chronic pancreatitis Zollinger-Ellison syndrome GERD Hypoalbuminemic - appears malnourished at baseline due to muscle wasting RA - mtx and enbrel held   LOS: 17 days    Jillyn Ledger , [facility] 09/02/2022, 10:08 AM Please see Amion for pager number during day hours 7:00am-4:30pm

## 2022-09-02 NOTE — Progress Notes (Signed)
Mobility Specialist - Progress Note   09/02/22 1643  Mobility  Activity Ambulated independently in hallway  Activity Response Tolerated well  Distance Ambulated (ft) 550 ft  $Mobility charge 1 Mobility  Level of Assistance Modified independent, requires aide device or extra time  Assistive Device Other (Comment) (IV Pole)    Pt ambulated independently in hallway. No physical assistance needed.   Paulla Dolly Mobility Specialist

## 2022-09-02 NOTE — Progress Notes (Signed)
Physical Therapy Treatment Patient Details Name: Jerry Knight MRN: [id number] DOB: 22-Jun-1961 Today's Date: 09/02/2022   History of Present Illness Pt is a 61 y/o male admitted secondary to bowel perforation. Pt is s/p emergent exploratory laparotomy, left colostomy and partial sigmoid colectomy on 9/15. PMH includes RA, COPD, and seizures.    PT Comments    Patient is making progress with increased activity tolerance this session. He ambulated in hallway with supervision using IV pole for support. Recommended to use the cane at home for ambulation. Reinforcement of logroll technique, routine mobility, incentive spirometer use. PT will follow up to maximize independence in preparation for discharge home with family.    Recommendations for follow up therapy are one component of a multi-disciplinary discharge planning process, led by the attending physician.  Recommendations may be updated based on patient status, additional functional criteria and insurance authorization.  Follow Up Recommendations  No PT follow up     Assistance Recommended at Discharge PRN  Patient can return home with the following A little help with bathing/dressing/bathroom;Assistance with cooking/housework;Assist for transportation;Help with stairs or ramp for entrance   Equipment Recommendations  None recommended by PT    Recommendations for Other Services       Precautions / Restrictions Precautions Precautions: Fall Restrictions Weight Bearing Restrictions: No     Mobility  Bed Mobility Overal bed mobility: Modified Independent             General bed mobility comments: reinforced logroll technique to protect abdominal wound. increased time required to complete tasks    Transfers Overall transfer level: Modified independent                 General transfer comment: good safety awareness with transfers    Ambulation/Gait Ambulation/Gait assistance: Supervision Gait Distance  (Feet): 300 Feet Assistive device: IV Pole Gait Pattern/deviations: Step-through pattern, Decreased stride length       General Gait Details: the patient preferred to use IV pole instead of single point cane. no loss of balance with ambulation. recommended to use cane at home   Stairs             Wheelchair Mobility    Modified Rankin (Stroke Patients Only)       Balance Overall balance assessment: Needs assistance Sitting-balance support: Feet supported Sitting balance-Leahy Scale: Good     Standing balance support: Single extremity supported, During functional activity Standing balance-Leahy Scale: Fair Standing balance comment: no external support required                            Cognition Arousal/Alertness: Awake/alert Behavior During Therapy: WFL for tasks assessed/performed Overall Cognitive Status: Within Functional Limits for tasks assessed                                          Exercises      General Comments General comments (skin integrity, edema, etc.): educated patient on abdominal bracing with coughing, logroll technique for bed mobility, encouraged incentive spirometer use      Pertinent Vitals/Pain Pain Assessment Pain Assessment: Faces Faces Pain Scale: Hurts a little bit Pain Location: abdomen Pain Descriptors / Indicators:  (pulling of tape around the wound) Pain Intervention(s): Limited activity within patient's tolerance, Monitored during session    Home Living  Prior Function            PT Goals (current goals can now be found in the care plan section) Acute Rehab PT Goals Patient Stated Goal: to feel better and go home PT Goal Formulation: With patient Time For Goal Achievement: 09/05/22 Potential to Achieve Goals: Good Progress towards PT goals: Progressing toward goals    Frequency    Min 3X/week      PT Plan Current plan remains appropriate     Co-evaluation              AM-PAC PT "6 Clicks" Mobility   Outcome Measure  Help needed turning from your back to your side while in a flat bed without using bedrails?: None Help needed moving from lying on your back to sitting on the side of a flat bed without using bedrails?: None Help needed moving to and from a bed to a chair (including a wheelchair)?: A Little Help needed standing up from a chair using your arms (e.g., wheelchair or bedside chair)?: None Help needed to walk in hospital room?: A Little Help needed climbing 3-5 steps with a railing? : A Little 6 Click Score: 21    End of Session   Activity Tolerance: Patient tolerated treatment well Patient left: in bed;with call bell/phone within reach   PT Visit Diagnosis: Other abnormalities of gait and mobility (R26.89)     Time: 8299-3716 PT Time Calculation (min) (ACUTE ONLY): 23 min  Charges:  $Therapeutic Activity: 23-37 mins                     [name], PT, MPT    Percell Locus 09/02/2022, 2:35 PM

## 2022-09-02 NOTE — Progress Notes (Signed)
Supervising Physician: Daryll Brod  Patient Status:  Jerry Knight - In-pt  Chief Complaint:  9 days s/p left retroperitoneal abscess drainage  Subjective:  Lying in bed. Talkative. Reports belly hurting this afternoon  Allergies: Ambien [zolpidem tartrate], Iohexol, Lunesta [eszopiclone], Seroquel [quetiapine fumarate], and Sonata [zaleplon]  Medications: Prior to Admission medications   Medication Sig Start Date End Date Taking? Authorizing Provider  amitriptyline (ELAVIL) 50 MG tablet Take 100 mg by mouth at bedtime.   Yes [provider]  atorvastatin (LIPITOR) 20 MG tablet Take 1 tablet (20 mg total) by mouth daily. 02/19/21  Yes Just, Laurita Quint, FNP  buprenorphine-naloxone (SUBOXONE) 8-2 mg SUBL SL tablet Place 1 tablet under the tongue daily.   Yes [provider]  cyclobenzaprine (FLEXERIL) 10 MG tablet Take 1 tablet (10 mg total) by mouth 3 (three) times daily. 02/19/21  Yes Just, Laurita Quint, FNP  etanercept (ENBREL) 50 MG/ML injection Inject 50 mg into the skin once a week.   Yes [provider]  folic acid (FOLVITE) 1 MG tablet Take 1 mg by mouth daily. 08/06/22  Yes [provider]  linaclotide Rolan Lipa) 145 MCG CAPS capsule Take 1 capsule (145 mcg total) by mouth daily before breakfast. 02/19/21  Yes Just, Laurita Quint, FNP  LORazepam (ATIVAN) 1 MG tablet Take 1 mg by mouth 3 (three) times daily. 07/18/22  Yes [provider]  methotrexate (RHEUMATREX) 2.5 MG tablet Take 10 mg by mouth once a week. 06/08/22  Yes [provider]  mirtazapine (REMERON) 45 MG tablet Take 1 tablet (45 mg total) by mouth at bedtime. 02/19/21  Yes Just, Laurita Quint, FNP  omeprazole (PRILOSEC) 20 MG capsule TAKE 1 CAPSULE(20 MG) BY MOUTH DAILY 02/19/21  Yes Just, Laurita Quint, FNP  traZODone (DESYREL) 50 MG tablet Take 3-4 tablets (150-200 mg total) by mouth at bedtime. 02/19/21  Yes Just, Laurita Quint, FNP     Vital Signs: BP 114/75 (BP Location: Left Arm)   Pulse 97    Temp 98.2 F (36.8 C) (Oral)   Resp 17   Ht 5' 5.5" (1.664 m)   Wt 103 lb (46.7 kg)   SpO2 98%   BMI 16.88 kg/m   Physical Exam Vitals reviewed.  Constitutional:      General: He is not in acute distress.    Appearance: He is ill-appearing.  HENT:     Head: Normocephalic and atraumatic.  Cardiovascular:     Rate and Rhythm: Normal rate.  Pulmonary:     Effort: Pulmonary effort is normal. No respiratory distress.  Abdominal:     Comments: Surgical dressing in place, ostomy bag empty, drain with minimal milky fluid, flushes and aspirates well.  Neurological:     Mental Status: He is alert.   Drain Location: LLQ Size: Fr size: 12 Fr Date of placement: 9/23  Currently to: Drain collection device: suction bulb 24 hour output:  Output by Drain (mL) 08/31/22 0700 - 08/31/22 1459 08/31/22 1500 - 08/31/22 2259 08/31/22 2300 - 09/01/22 0659 09/01/22 0700 - 09/01/22 1459 09/01/22 1500 - 09/01/22 2259 09/01/22 2300 - 09/02/22 0659 09/02/22 0700 - 09/02/22 1459 09/02/22 1500 - 09/02/22 1658  Closed System Drain 1 Lateral LLQ Bulb (JP) '10 10 10   10 10     '$ Current examination: Flushes/aspirates easily.  Insertion site unremarkable. Suture in place. Dressed appropriately.    Imaging: CT ABDOMEN PELVIS WO CONTRAST  Result Date: 08/30/2022 CLINICAL DATA:  Postop abdominal pain.  Bowel perforation. EXAM:  CT ABDOMEN AND PELVIS WITHOUT CONTRAST TECHNIQUE: Multidetector CT imaging of the abdomen and pelvis was performed following the standard protocol without IV contrast. RADIATION DOSE REDUCTION: This exam was performed according to the departmental dose-optimization program which includes automated exposure control, adjustment of the mA and/or kV according to patient size and/or use of iterative reconstruction technique. COMPARISON:  08/23/2022. FINDINGS: Lower chest: The heart is normal in size and there is a small pericardial effusion. Patchy atelectasis or infiltrate is noted at the  lung bases bilaterally, greater on the left than on the right. There is a 7 mm pleural-based nodule in the right lower lobe, axial image 11. Hepatobiliary: No focal liver abnormality is seen. No gallstones, gallbladder wall thickening, or biliary dilatation. Pancreas: Unremarkable. No pancreatic ductal dilatation or surrounding inflammatory changes. Spleen: Normal in size without focal abnormality. Adrenals/Urinary Tract: The adrenal glands are within normal limits. There is a cyst in the upper pole of the right kidney. Cortical calcifications and scarring is noted on the right. An extrarenal pelvis is noted on the left. No hydroureteronephrosis bilaterally. The bladder is unremarkable. Stomach/Bowel: Surgical changes are noted at the stomach and in the epigastric region. There is a patulous loop of small bowel in the mid left abdomen at an anastomotic site. No bowel obstruction, free air, or pneumatosis. The patient is status post sigmoidectomy with left lower quadrant colostomy. There is bowel wall thickening involving the distal descending colon at the colostomy site. The appendix is surgically absent. Vascular/Lymphatic: Aortic atherosclerosis. No enlarged abdominal or pelvic lymph nodes. Reproductive: Prostate is unremarkable. Other: Postsurgical changes are noted in the anterior abdominal wall. Small amount of fluid is noted in the left pericolic gutter. A surgical drain terminates in the pelvis in the left lower quadrant lateral to the psoas muscle. Minimal residual soft tissue thickening with a few foci of air identified at the drain tip in the previously described abscess is no longer seen. Musculoskeletal: No acute osseous abnormality. IMPRESSION: 1. Status post sigmoidectomy with left lower quadrant ileostomy. There is mild colonic wall thickening involving the distal descending colon which is likely related to postsurgical changes. No bowel obstruction. 2. Left lower quadrant pigtail catheter terminating  lateral to the psoas muscle on the left. Minimal residual soft tissue density and air is noted. No drainable fluid collection is identified. 3. 7 mm right lower lobe pulmonary nodule. Non-contrast chest CT at 6-12 months is recommended. If the nodule is stable at time of repeat CT, then future CT at 18-24 months (from today's scan) is considered optional for low-risk patients, but is recommended for high-risk patients. This recommendation follows the consensus statement: Guidelines for Management of Incidental Pulmonary Nodules Detected on CT Images: From the Fleischner Society 2017; Radiology 2017; 284:228-243. 4. Patchy atelectasis or infiltrate at the lung bases bilaterally. 5. Aortic atherosclerosis. Electronically Signed   By: Brett Fairy M.D.   On: 08/30/2022 20:39    Labs:  CBC: Recent Labs    08/28/22 0307 08/29/22 1211 08/30/22 0440 09/01/22 0429  WBC 13.7* 19.3* 15.3* 12.5*  HGB 10.2* 9.7* 10.2* 9.6*  HCT 31.6* 30.3* 30.6* 29.3*  PLT 558* 633* 598* 527*    COAGS: Recent Labs    08/24/22 1415  INR 1.1    BMP: Recent Labs    08/28/22 0307 08/29/22 0308 08/30/22 0440 09/02/22 0423  NA 137 140 136 136  K 4.2 4.0 3.9 4.1  CL 103 106 105 104  CO2 '26 25 23 26  '$ GLUCOSE 126*  130* 134* 123*  BUN '11 13 11 14  '$ CALCIUM 9.0 8.6* 8.7* 8.7*  CREATININE 0.50* 0.49* 0.43* 0.48*  GFRNONAA >60 >60 >60 >60    LIVER FUNCTION TESTS: Recent Labs    08/25/22 0325 08/26/22 0319 08/29/22 0308 09/02/22 0423  BILITOT 0.4 0.5 0.3 0.2*  AST '16 18 19 15  '$ ALT '12 13 14 14  '$ ALKPHOS 74 82 76 72  PROT 6.3* 7.0 7.3 7.8  ALBUMIN 1.8* 2.0* 2.2* 2.5*    Assessment and Plan:  Bowel perforation --9 days s/p drain placement --will continue to follow  Plan: Continue TID flushes with 5 cc NS. Record output Q shift. Dressing changes QD or PRN if soiled.  Call IR APP or on call IR MD if difficulty flushing or sudden change in drain output.  Repeat imaging/possible drain injection once  output < 10 mL/QD (excluding flush material). Consideration for drain removal if output is < 10 mL/QD (excluding flush material), pending discussion with the providing surgical service.  Discharge planning: Please contact IR APP or on call IR MD prior to patient d/c to ensure appropriate follow up plans are in place. Typically patient will follow up with IR clinic 10-14 days post d/c for repeat imaging/possible drain injection. IR scheduler will contact patient with date/time of appointment. Patient will need to flush drain QD with 5 cc NS, record output QD, dressing changes every 2-3 days or earlier if soiled.   IR will continue to follow - please call with questions or concerns.    Electronically Signed: Pasty Spillers, PA 09/02/2022, 4:43 PM   I spent a total of 15 Minutes at the the patient's bedside AND on the patient's hospital floor or unit, greater than 50% of which was counseling/coordinating care for drain management

## 2022-09-02 NOTE — Consult Note (Signed)
[place] Nurse ostomy follow up Stoma type/location: LLQ colostomy Stomal assessment/size: 1.5 inches, round, moist, budded Peristomal assessment: intact Treatment options for stomal/peristomal skin: barrier ring Output: none Ostomy pouching: 2pc. I placed a 2 and 3/4 inch system today to coordinate with the irrigation sleeve that was used for a warm water enema via the stoma. I was able to instill 750 ml, and the patient retained approximately 500 ml. No difficulties during the irrigation.  Primary LPN, Tameka, present to learn how to irrigate the colostomy and the products used. Education provided: Patient and Beau Fanny were educated on using the irrigation set up. Enrolled patient in [facility] program: Yes, previously.  I encouraged the patient to drink prune juice and eat applesauce to assist with getting the bowels to move and produce stool.  He is willing to do this.  Val Riles, RN, MSN, CWOCN, CNS-BC, pager [phone]

## 2022-09-03 DIAGNOSIS — K631 Perforation of intestine (nontraumatic): Secondary | ICD-10-CM | POA: Diagnosis not present

## 2022-09-03 LAB — GLUCOSE, CAPILLARY
Glucose-Capillary: 116 mg/dL — ABNORMAL HIGH (ref 70–99)
Glucose-Capillary: 116 mg/dL — ABNORMAL HIGH (ref 70–99)
Glucose-Capillary: 122 mg/dL — ABNORMAL HIGH (ref 70–99)
Glucose-Capillary: 129 mg/dL — ABNORMAL HIGH (ref 70–99)
Glucose-Capillary: 136 mg/dL — ABNORMAL HIGH (ref 70–99)

## 2022-09-03 LAB — CBC
HCT: 29.8 % — ABNORMAL LOW (ref 39.0–52.0)
Hemoglobin: 9.8 g/dL — ABNORMAL LOW (ref 13.0–17.0)
MCH: 30.8 pg (ref 26.0–34.0)
MCHC: 32.9 g/dL (ref 30.0–36.0)
MCV: 93.7 fL (ref 80.0–100.0)
Platelets: 441 10*3/uL — ABNORMAL HIGH (ref 150–400)
RBC: 3.18 MIL/uL — ABNORMAL LOW (ref 4.22–5.81)
RDW: 14.3 % (ref 11.5–15.5)
WBC: 14.6 10*3/uL — ABNORMAL HIGH (ref 4.0–10.5)
nRBC: 0 % (ref 0.0–0.2)

## 2022-09-03 LAB — VITAMIN A: Vitamin A (Retinoic Acid): 38.5 ug/dL (ref 20.1–62.0)

## 2022-09-03 MED ORDER — TRAVASOL 10 % IV SOLN
INTRAVENOUS | Status: AC
  Filled 2022-09-03: qty 998.4

## 2022-09-03 MED ORDER — GUAIFENESIN 100 MG/5ML PO LIQD: 5.0000 mL | ORAL | Status: DC | PRN

## 2022-09-03 MED ORDER — HYDRALAZINE HCL 20 MG/ML IJ SOLN: 10.0000 mg | INTRAMUSCULAR | Status: DC | PRN

## 2022-09-03 MED ORDER — METOPROLOL TARTRATE 5 MG/5ML IV SOLN: 5.0000 mg | INTRAVENOUS | Status: DC | PRN

## 2022-09-03 MED ORDER — MAGNESIUM HYDROXIDE 400 MG/5ML PO SUSP
15.0000 mL | Freq: Every day | ORAL | Status: DC
  Administered 2022-09-03 – 2022-09-07 (×5): 15 mL via ORAL
  Filled 2022-09-03 (×5): qty 30

## 2022-09-03 MED ORDER — IPRATROPIUM-ALBUTEROL 0.5-2.5 (3) MG/3ML IN SOLN: 3.0000 mL | RESPIRATORY_TRACT | Status: DC | PRN

## 2022-09-03 NOTE — Progress Notes (Signed)
Mobility Specialist - Progress Note   09/03/22 1441  Mobility  Activity Ambulated independently in hallway  Activity Response Tolerated well  Distance Ambulated (ft) 500 ft  $Mobility charge 1 Mobility  Level of Assistance Modified independent, requires aide device or extra time  Assistive Device Other (Comment) (IV Pole)    Pt received in bed agreeable to mobility. Left on hallway bench w/ all needs met.   Paulla Dolly Mobility Specialist

## 2022-09-03 NOTE — Consult Note (Signed)
[place] Nurse ostomy follow up Consult received for ostomy irrigation to promote bowel function.  Primary nurse Benjie Karvonen and I provided a 750 ml irrigation using the cone enema set and irrigation sleeve.  The patient tolerated very well.  Approximately 250 ml of water immediately returned, leaving 500 ml retained in the bowel.   The patient's wife was present and listened and watched as we performed the irrigation. Val Riles, RN, MSN, CWOCN, CNS-BC, pager [phone]

## 2022-09-03 NOTE — Progress Notes (Signed)
PROGRESS NOTE    Jerry Knight  MOL:[id number] DOB: 1961-09-27 DOA: 08/16/2022 PCP: Beverley Fiedler, FNP   Brief Narrative:  61 y.o. male with medical history significant of  COPD, chronic pancreatitis, rheumatoid arthritis on Enbrel, history of seizures not on an AED, chronic pain on Suboxone, polysubstance abuse, Zollinger-Ellison syndrome, and constipation who presented with complaints of  lower abdominal pain.  Imaging remarkable for colonic perforation/obstruction  Patient underwent emergent exploratory laparotomy, left colostomy and partial sigmoid colostomy placement on 9/15 and thereafter also requiring left lower quadrant drain by IR on 9/23.  Patient did complete Zosyn followed by Unasyn from 9/14 - 9/28.  Currently getting TPN.   Assessment & Plan:  Principal Problem:   Bowel perforation (HCC) Active Problems:   Sepsis ([place])   Normocytic anemia   Hypokalemia   COPD (chronic obstructive pulmonary disease) (HCC)   Rheumatoid arthritis (HCC)   Anxiety   GERD (gastroesophageal reflux disease)   Chronic neck and back pain   Tobacco abuse   Protein-calorie malnutrition, severe   Sepsis secondary to bowel perforation (HCC) due to stercoral ulcer/stool impaction, left  retroperitoneal abscess -Upon admission patient met sepsis criteria.  Underwent emergent laparotomy, left colostomy and partial sigmoid colectomy on 9/15.  Also required left lower quadrant drain placement by IR on 9/23.  Completed course of Zosyn followed by Unasyn from 9/14 - 9/28.  IR not recommending outpatient follow-up in 10-14 days.Patient will need to flush drain QD with 5 cc NS, record output QD, dressing changes every 2-3 days or earlier if soiled.  Notify IR prior to dc so they can schedule f/u - General surgery is following. - Pain control, bowel regimen   Acute on chronic pain  -Pain has been an issue, on outpatient Suboxone.  Now off PCA.  Currently on OxyContin 50 mg twice daily, as needed  oxycodone and Zanaflex.  Normocytic anemia -Likely anemia of chronic disease -HTN stable 9-10   Hypokalemia, hyponatremia -On TPN.  As needed repletion.   COPD, without exacerbation - Chest x-ray with chronic interstitial lung changes with bibasilar atelectasis. -Stable, no wheezing, continue albuterol nebs as needed   History of rheumatoid arthritis -Outpatient on Enbrel 50 mg and methotrexate 10 mg on Saturdays -Currently regimen on hold   Insomnia, anxiety -For now continue Ativan as needed for anxiety -Continue trazodone 150 mg at bedtime    GERD -Continue PPI     Tobacco abuse Patient reportedly smokes 2 packs of cigarettes per day on average. - cont Nicotine patch   7 mm right lower lobe pulmonary nodule. Non-contrast chest CT at 6-12 months is recommended. If the nodule is stable at time of repeat CT, then future CT at 18-24 months (from today's scan) is considered optional for low-risk patients, but is recommended for high-risk patients   Moderate to severe protein calorie malnutrition due to acute illness, hypoalbuminemia 1.9, muscle wasting Estimated body mass index is 16.88 kg/m as calculated from the following:   Height as of this encounter: 5' 5.5" (1.664 m).   Weight as of this encounter: 46.7 kg. -Currently on TPN     DVT prophylaxis: enoxaparin (LOVENOX) injection 40 mg Start: 08/25/22 1000 SCD's Start: 08/16/22 1754 SCDs Start: 08/16/22 1600 Code Status: Full Code Family Communication:    Status is: Inpatient  Nutritional status    Signs/Symptoms: severe muscle depletion, severe fat depletion  Interventions: TPN  Body mass index is 16.88 kg/m.         Subjective: Seen  and examined at bedside, does not have any complaints this morning. Ambulating in the hallway.   Examination:  General exam: Appears calm and comfortable  Respiratory system: Clear to auscultation. Respiratory effort normal. Cardiovascular system: S1 & S2  heard, RRR. No JVD, murmurs, rubs, gallops or clicks. No pedal edema. Gastrointestinal system: Abdomen is soft but mildly distended.  Surgical site noted.  No obvious evidence of bleeding.  Incision noted with stable separation of his wounds. Central nervous system: Alert and oriented. No focal neurological deficits. Extremities: Symmetric 5 x 5 power. Skin: Multiple surgical wounds.  Noted. Psychiatry: Judgement and insight appear normal. Mood & affect appropriate.   RUE PICC 9/19 LUQ Colostomy LLQ Drain     Objective: Vitals:   09/02/22 1706 09/02/22 2017 09/03/22 0553 09/03/22 0835  BP: 117/82 110/76 139/71 117/72  Pulse: (!) 110 100 (!) 101 99  Resp: 18 18 18 18   Temp: 99.1 F (37.3 C) 98 F (36.7 C) 98 F (36.7 C) 98.8 F (37.1 C)  TempSrc: Oral Oral Oral Oral  SpO2: 98% 97% 97% 98%  Weight:      Height:        Intake/Output Summary (Last 24 hours) at 09/03/2022 0900 Last data filed at 09/03/2022 0841 Gross per 24 hour  Intake 3179.79 ml  Output 1190 ml  Net 1989.79 ml   Filed Weights   08/31/22 0336 09/01/22 0533 09/02/22 0500  Weight: 49.4 kg 50.2 kg 46.7 kg     Data Reviewed:   CBC: Recent Labs  Lab 08/28/22 0307 08/29/22 1211 08/30/22 0440 09/01/22 0429  WBC 13.7* 19.3* 15.3* 12.5*  HGB 10.2* 9.7* 10.2* 9.6*  HCT 31.6* 30.3* 30.6* 29.3*  MCV 94.6 95.0 92.4 93.6  PLT 558* 633* 598* 623*   Basic Metabolic Panel: Recent Labs  Lab 08/28/22 0307 08/29/22 0308 08/30/22 0440 09/02/22 0423  NA 137 140 136 136  K 4.2 4.0 3.9 4.1  CL 103 106 105 104  CO2 26 25 23 26   GLUCOSE 126* 130* 134* 123*  BUN 11 13 11 14   CREATININE 0.50* 0.49* 0.43* 0.48*  CALCIUM 9.0 8.6* 8.7* 8.7*  MG 1.8 1.9  --  1.8  PHOS 2.7 3.0 3.4 3.8   GFR: Estimated Creatinine Clearance: 65.7 mL/min (A) (by C-G formula based on SCr of 0.48 mg/dL (L)). Liver Function Tests: Recent Labs  Lab 08/29/22 0308 09/02/22 0423  AST 19 15  ALT 14 14  ALKPHOS 76 72  BILITOT 0.3  0.2*  PROT 7.3 7.8  ALBUMIN 2.2* 2.5*   No results for input(s): "LIPASE", "AMYLASE" in the last 168 hours. No results for input(s): "AMMONIA" in the last 168 hours. Coagulation Profile: No results for input(s): "INR", "PROTIME" in the last 168 hours. Cardiac Enzymes: No results for input(s): "CKTOTAL", "CKMB", "CKMBINDEX", "TROPONINI" in the last 168 hours. BNP (last 3 results) No results for input(s): "PROBNP" in the last 8760 hours. HbA1C: No results for input(s): "HGBA1C" in the last 72 hours. CBG: Recent Labs  Lab 09/02/22 1155 09/02/22 1817 09/03/22 0004 09/03/22 0554 09/03/22 0830  GLUCAP 116* 127* 136* 129* 122*   Lipid Profile: Recent Labs    09/02/22 0423  TRIG 86   Thyroid Function Tests: No results for input(s): "TSH", "T4TOTAL", "FREET4", "T3FREE", "THYROIDAB" in the last 72 hours. Anemia Panel: No results for input(s): "VITAMINB12", "FOLATE", "FERRITIN", "TIBC", "IRON", "RETICCTPCT" in the last 72 hours. Sepsis Labs: No results for input(s): "PROCALCITON", "LATICACIDVEN" in the last 168 hours.  Recent Results (  from the past 240 hour(s))  Aerobic/Anaerobic Culture w Gram Stain (surgical/deep wound)     Status: None   Collection Time: 08/24/22  3:50 PM   Specimen: Abscess  Result Value Ref Range Status   Specimen Description ABSCESS  Final   Special Requests Normal  Final   Gram Stain   Final    ABUNDANT WBC PRESENT, PREDOMINANTLY PMN RARE GRAM NEGATIVE RODS RARE GRAM POSITIVE RODS    Culture   Final    ABUNDANT ESCHERICHIA COLI FEW BACTEROIDES THETAIOTAOMICRON BETA LACTAMASE POSITIVE Performed at [facility], [address], [place], [place] [address]    Report Status 08/28/2022 FINAL  Final   Organism ID, Bacteria ESCHERICHIA COLI  Final      Susceptibility   Escherichia coli - MIC*    AMPICILLIN <=2 SENSITIVE Sensitive     CEFAZOLIN <=4 SENSITIVE Sensitive     CEFEPIME <=0.12 SENSITIVE Sensitive     CEFTAZIDIME <=1 SENSITIVE  Sensitive     CEFTRIAXONE <=0.25 SENSITIVE Sensitive     CIPROFLOXACIN <=0.25 SENSITIVE Sensitive     GENTAMICIN <=1 SENSITIVE Sensitive     IMIPENEM <=0.25 SENSITIVE Sensitive     TRIMETH/SULFA <=20 SENSITIVE Sensitive     AMPICILLIN/SULBACTAM <=2 SENSITIVE Sensitive     PIP/TAZO <=4 SENSITIVE Sensitive     * ABUNDANT ESCHERICHIA COLI         Radiology Studies: No results found.      Scheduled Meds:  acetaminophen  1,000 mg Oral Q6H   atorvastatin  20 mg Oral Daily   Chlorhexidine Gluconate Cloth  6 each Topical Daily   docusate sodium  100 mg Oral BID   enoxaparin (LOVENOX) injection  40 mg Subcutaneous Q24H   feeding supplement  237 mL Oral TID BM   lidocaine  1 patch Transdermal Q24H   nicotine  21 mg Transdermal QHS   oxyCODONE  15 mg Oral Q12H   pantoprazole  40 mg Oral Daily   polyethylene glycol  17 g Oral BID   sodium chloride flush  3 mL Intravenous Q12H   sodium chloride flush  5 mL Intracatheter Q8H   sodium phosphate  1 enema Rectal Once   tiZANidine  2 mg Oral Q8H   traZODone  150 mg Oral QHS   Continuous Infusions:  chlorproMAZINE (THORAZINE) 12.5 mg in sodium chloride 0.9 % 25 mL IVPB 12.5 mg (09/03/22 0847)   TPN ADULT (ION) 80 mL/hr at 09/02/22 1747     LOS: 18 days   Time spent= 35 mins    Tranae Laramie Arsenio Loader, MD [facility]  If 7PM-7AM, please contact night-coverage  09/03/2022, 9:00 AM

## 2022-09-03 NOTE — Progress Notes (Signed)
18 Days Post-Op  Subjective: CC: S/p warm water enema with wocn yesterday. Per note "instill 750 ml, and the patient retained approximately 500 ml. No difficulties during the irrigation." Scant output since this. Some small amount of thin brown liquid in bag, 25cc/24 hours. Reports more air/gas. Having more gas pain and feels like things are trying to move. Tolerating soft diet and eating more. Some nausea, no vomiting. Mobilizing in halls x 3 yesterday.  Objective: Vital signs in last 24 hours: Temp:  [98 F (36.7 C)-99.1 F (37.3 C)] 98.8 F (37.1 C) (10/03 0835) Pulse Rate:  [99-110] 99 (10/03 0835) Resp:  [18] 18 (10/03 0835) BP: (110-139)/(71-82) 117/72 (10/03 0835) SpO2:  [97 %-98 %] 98 % (10/03 0835) Last BM Date : 09/02/22  Intake/Output from previous day: 10/02 0701 - 10/03 0700 In: 3179.8 [P.O.:480; I.V.:2544.8; IV Piggyback:150] Out: 1150 [Urine:1100; Drains:25; Stool:25] Intake/Output this shift: Total I/O In: -  Out: 250 [Urine:250]  PE: General: pleasant, WD, male who is laying in bed in NAD Lungs: Respiratory effort nonlabored Abd: Soft, mild distention. No ttp reported on exam. . No rigidity or guarding. Colostomy with small amount of air and thin brown liquid in bag. Stoma pink and viable. Surgical JP drain site with dressing in place, cdi. IR drain with tan purulent output, 25cc/24 hours. Midline wound with stable separation as noted on prior pictures/exam. Most of the wound with layer of granulation tissue overlying and less fibrinous exudate compared to prior   Lab Results:  Recent Labs    09/01/22 0429  WBC 12.5*  HGB 9.6*  HCT 29.3*  PLT 527*   BMET Recent Labs    09/02/22 0423  NA 136  K 4.1  CL 104  CO2 26  GLUCOSE 123*  BUN 14  CREATININE 0.48*  CALCIUM 8.7*   PT/INR No results for input(s): "LABPROT", "INR" in the last 72 hours. CMP     Component Value Date/Time   NA 136 09/02/2022 0423   NA 140 10/12/2020 1526   K 4.1  09/02/2022 0423   CL 104 09/02/2022 0423   CO2 26 09/02/2022 0423   GLUCOSE 123 (H) 09/02/2022 0423   BUN 14 09/02/2022 0423   BUN 10 10/12/2020 1526   CREATININE 0.48 (L) 09/02/2022 0423   CREATININE 0.77 01/27/2013 1105   CALCIUM 8.7 (L) 09/02/2022 0423   PROT 7.8 09/02/2022 0423   PROT 7.5 10/12/2020 1526   ALBUMIN 2.5 (L) 09/02/2022 0423   ALBUMIN 4.5 10/12/2020 1526   AST 15 09/02/2022 0423   ALT 14 09/02/2022 0423   ALKPHOS 72 09/02/2022 0423   BILITOT 0.2 (L) 09/02/2022 0423   BILITOT 0.3 10/12/2020 1526   GFRNONAA >60 09/02/2022 0423   GFRAA 123 10/12/2020 1526   Lipase     Component Value Date/Time   LIPASE 39 08/16/2022 0506    Studies/Results: No results found.  Anti-infectives: Anti-infectives (From admission, onward)    Start     Dose/Rate Route Frequency Ordered Stop   08/27/22 1300  Ampicillin-Sulbactam (UNASYN) 3 g in sodium chloride 0.9 % 100 mL IVPB        3 g 200 mL/hr over 30 Minutes Intravenous Every 8 hours 08/27/22 1202 08/29/22 0447   08/17/22 1630  piperacillin-tazobactam (ZOSYN) IVPB 3.375 g  Status:  Discontinued        3.375 g 12.5 mL/hr over 240 Minutes Intravenous Every 8 hours 08/17/22 1540 08/27/22 1202   08/16/22 1500  piperacillin-tazobactam (ZOSYN) IVPB 3.375  g        3.375 g 100 mL/hr over 30 Minutes Intravenous STAT 08/16/22 1446 08/16/22 2032   08/16/22 0615  piperacillin-tazobactam (ZOSYN) IVPB 3.375 g        3.375 g 100 mL/hr over 30 Minutes Intravenous  Once 08/16/22 0609 08/16/22 0713        Assessment/Plan POD 18, s/p ex lap partial sigmoid colectomy, colostomy by Dr. Oda Cogan on 9/15 for Perforated stercoral ulcer - History of chronic pain on suboxone. Off PCA. On scheduled oxycontin today '15mg'$  BID and 5 mg oxycodone for breakthrough.  This has help significantly. - Cont TPN till having better bowel function - Cont soft diet - Enema through ostomy 10/2. Will discuss with MD about repeating today vs increasing po bowel  regimen - CT w/ extraperitoneal fluid collection in LLQ lateral to psoas. S/p IR drain 9/23. Cx with E. Coli. Narrowed to Unasyn. Comp abx 5d after drainage (through 9/28). CT 9/29 with improvement (IR following). No new intra-abdominal abscess   - Surgical JP drain out 9/28 - Continue Mepitel at base of midline with wtd dressings over bid  - WOC RN following for new ostomy - Encourage ambulation and IS use   FEN - Soft, IVFs, TPN VTE - SCDs, Lovenox ID - zosyn 9/14 - 9/26. Unasyn 9/26 - 9/28. None currently. WBC 12.5 (10/1) Foley - out 9/18. voiding   Per primary Tobacco abuse/COPD - 2 ppd H/O substance abuse H/O opioid dependence - now on suboxone prn H/O seizure d/o Chronic pancreatitis Zollinger-Ellison syndrome GERD Hypoalbuminemic - appears malnourished at baseline due to muscle wasting RA - mtx and enbrel held   LOS: 18 days    Jillyn Ledger , [facility] Surgery 09/03/2022, 8:55 AM Please see Amion for pager number during day hours 7:00am-4:30pm

## 2022-09-03 NOTE — Progress Notes (Signed)
PHARMACY - TOTAL PARENTERAL NUTRITION CONSULT NOTE   Indication: Prolonged ileus  Patient Measurements: Height: 5' 5.5" (166.4 cm) Weight: 46.7 kg (103 lb) IBW/kg (Calculated) : 62.65 TPN AdjBW (KG): 52.6 Body mass index is 16.88 kg/m.  Usual Weight: 115 lbs  Assessment:  61 yo male with a history of chronic pain syndrome on Suboxone, chronic pancreatitis with prior surgery for pseudocyst removal in the 1990s, prior BII in the 1980s, COPD, rheumatoid arthritis on Enbrel, history of seizures not on an AED, and Zollinger-Ellison syndrome who presented with acute onset of abdominal pain along with N/V. He was found to have a WBC of 22K, lactic acid of 3.1 and a CT scan concerning for a colonic perforation. Pharmacy consulted to initiate TPN for prolonged ileus.  Glucose / Insulin: no hx DM, CBGs controlled   Electrolytes:  all stable WNL  Renal: Scr 0.48 stable (bsl < 1); BUN wnl Hepatic: LFTs / TG wnl. Tbili 0.2, Albumin 2.2 Intake / Output; MIVF: UOP 1 ml/kg/hr; R JP drain removed 9/28. L JP drain 25 ml, 25 ml colostomy output GI Imaging: 9/15 colonic perforation in the region of the sigmoid colon likely secondary to impacted stool 9/29 CT abd/pelvis: no obstruction or drainable fluid GI Surgeries / Procedures:  9/15 Exploratory laparotomy, Left colostomy and partial sigmoid colectomy  9/23: drain placed for L retroperitoneal abscess (115m fluid aspirated)  Central access: 08/20/22 TPN start date:  08/20/22  Nutritional Goals: Goal TPN rate is 80 mL/hr (provides 100 g of protein and 1993 kcals per day)  RD Assessment: Estimated Needs Total Energy Estimated Needs: 1900-2100 kcal/d Total Protein Estimated Needs: 90-110g/d Total Fluid Estimated Needs: >/= 1.7 L   Current Nutrition:  TPN Soft diet Ensure TID - 2 charted as given  Plan:  Continue TPN at goal rate 875mhr TPN will provide 100g protein and 1993 kcal, meeting 100% of patient needs Electrolytes in TPN: Na 150  mEq/L, K 45 mEq/L, Ca 2 mEq/L. Mg 10 mEq/L, Phos 16 mmol/L. Cl:Ac 1:1 Add standard MVI and trace elements to TPN Monitor CBGs Q6H - continue off SSI with CBGs controlled Monitor standard TPN labs on Mon/Thurs and as needed Awaiting for colostomy output before weaning TPN  Thank you for allowing pharmacy to be a part of this patient's care.  LoErskine SpeedPharmD Clinical Pharmacist 09/03/2022, 8:22 AM

## 2022-09-04 DIAGNOSIS — K631 Perforation of intestine (nontraumatic): Secondary | ICD-10-CM | POA: Diagnosis not present

## 2022-09-04 LAB — BASIC METABOLIC PANEL
Anion gap: 8 (ref 5–15)
BUN: 17 mg/dL (ref 6–20)
CO2: 27 mmol/L (ref 22–32)
Calcium: 8.6 mg/dL — ABNORMAL LOW (ref 8.9–10.3)
Chloride: 102 mmol/L (ref 98–111)
Creatinine, Ser: 0.53 mg/dL — ABNORMAL LOW (ref 0.61–1.24)
GFR, Estimated: >60 mL/min (ref >60)
Glucose, Bld: 117 mg/dL — ABNORMAL HIGH (ref 70–99)
Potassium: 4.2 mmol/L (ref 3.5–5.1)
Sodium: 137 mmol/L (ref 135–145)

## 2022-09-04 LAB — GLUCOSE, CAPILLARY
Glucose-Capillary: 105 mg/dL — ABNORMAL HIGH (ref 70–99)
Glucose-Capillary: 122 mg/dL — ABNORMAL HIGH (ref 70–99)
Glucose-Capillary: 127 mg/dL — ABNORMAL HIGH (ref 70–99)
Glucose-Capillary: 133 mg/dL — ABNORMAL HIGH (ref 70–99)

## 2022-09-04 LAB — MAGNESIUM: Magnesium: 1.8 mg/dL (ref 1.7–2.4)

## 2022-09-04 MED ORDER — TRAVASOL 10 % IV SOLN
INTRAVENOUS | Status: AC
  Filled 2022-09-04: qty 499.2

## 2022-09-04 MED ORDER — MORPHINE SULFATE (PF) 2 MG/ML IV SOLN
2.0000 mg | INTRAVENOUS | Status: DC | PRN
  Administered 2022-09-04 – 2022-09-07 (×15): 4 mg via INTRAVENOUS
  Administered 2022-09-07: 2 mg via INTRAVENOUS
  Administered 2022-09-07 (×2): 4 mg via INTRAVENOUS
  Administered 2022-09-08 – 2022-09-09 (×6): 2 mg via INTRAVENOUS
  Filled 2022-09-04: qty 2
  Filled 2022-09-04: qty 1
  Filled 2022-09-04 (×2): qty 2
  Filled 2022-09-04: qty 1
  Filled 2022-09-04 (×3): qty 2
  Filled 2022-09-04 (×3): qty 1
  Filled 2022-09-04 (×2): qty 2
  Filled 2022-09-04 (×2): qty 1
  Filled 2022-09-04: qty 2
  Filled 2022-09-04: qty 1
  Filled 2022-09-04 (×9): qty 2

## 2022-09-04 MED ORDER — VITAMIN D 25 MCG (1000 UNIT) PO TABS
1000.0000 [IU] | ORAL_TABLET | Freq: Every day | ORAL | Status: DC
  Administered 2022-09-04 – 2022-09-11 (×8): 1000 [IU] via ORAL
  Filled 2022-09-04 (×8): qty 1

## 2022-09-04 MED ORDER — ENSURE ENLIVE PO LIQD
237.0000 mL | Freq: Two times a day (BID) | ORAL | Status: DC
  Administered 2022-09-05: 237 mL via ORAL

## 2022-09-04 MED ORDER — OXYCODONE HCL 5 MG PO TABS
5.0000 mg | ORAL_TABLET | ORAL | Status: DC | PRN
  Administered 2022-09-04 – 2022-09-11 (×20): 10 mg via ORAL
  Filled 2022-09-04 (×21): qty 2

## 2022-09-04 MED ORDER — MORPHINE SULFATE (PF) 2 MG/ML IV SOLN
2.0000 mg | Freq: Four times a day (QID) | INTRAVENOUS | Status: DC | PRN
  Administered 2022-09-04: 4 mg via INTRAVENOUS
  Filled 2022-09-04: qty 2

## 2022-09-04 NOTE — Progress Notes (Addendum)
19 Days Post-Op  Subjective: CC: Pain stable. Feels pain medications are helping. S/p enema yesterday and po milk of mag. 500cc brown liquid stool yesterday and more air. No nausea/vomiting. Tolerating 50-75% trays. Mobilizing. Voiding.   Objective: Vital signs in last 24 hours: Temp:  [97.8 F (36.6 C)-98.7 F (37.1 C)] 98.4 F (36.9 C) (10/04 0758) Pulse Rate:  [89-108] 108 (10/04 0758) Resp:  [16-18] 16 (10/04 0758) BP: (88-119)/(61-70) 105/61 (10/04 0758) SpO2:  [97 %] 97 % (10/04 0758) Last BM Date : 09/03/22  Intake/Output from previous day: 10/03 0701 - 10/04 0700 In: 1924.4 [P.O.:1060; I.V.:809.4; IV Piggyback:50] Out: 2150 [Urine:1650; Stool:500] Intake/Output this shift: Total I/O In: -  Out: 600 [Urine:600]  PE: General: pleasant, WD, male who is laying in bed in NAD Lungs: Respiratory effort nonlabored Abd: Soft, mild distention. No ttp reported on exam. . No rigidity or guarding. Colostomy with small amount of air and thin brown liquid stool in bag. Stoma pink and viable. Surgical JP drain site with dressing in place, cdi. IR drain with scant tan purulent output, no output recorded over thee last 24 hours. Midline wound with stable separation as noted on prior pictures/exam. Most of the wound with layer of granulation tissue overlying and less fibrinous exudate compared to prior   Lab Results:  Recent Labs    09/03/22 1456  WBC 14.6*  HGB 9.8*  HCT 29.8*  PLT 441*   BMET Recent Labs    09/02/22 0423 09/04/22 0347  NA 136 137  K 4.1 4.2  CL 104 102  CO2 26 27  GLUCOSE 123* 117*  BUN 14 17  CREATININE 0.48* 0.53*  CALCIUM 8.7* 8.6*   PT/INR No results for input(s): "LABPROT", "INR" in the last 72 hours. CMP     Component Value Date/Time   NA 137 09/04/2022 0347   NA 140 10/12/2020 1526   K 4.2 09/04/2022 0347   CL 102 09/04/2022 0347   CO2 27 09/04/2022 0347   GLUCOSE 117 (H) 09/04/2022 0347   BUN 17 09/04/2022 0347   BUN 10  10/12/2020 1526   CREATININE 0.53 (L) 09/04/2022 0347   CREATININE 0.77 01/27/2013 1105   CALCIUM 8.6 (L) 09/04/2022 0347   PROT 7.8 09/02/2022 0423   PROT 7.5 10/12/2020 1526   ALBUMIN 2.5 (L) 09/02/2022 0423   ALBUMIN 4.5 10/12/2020 1526   AST 15 09/02/2022 0423   ALT 14 09/02/2022 0423   ALKPHOS 72 09/02/2022 0423   BILITOT 0.2 (L) 09/02/2022 0423   BILITOT 0.3 10/12/2020 1526   GFRNONAA >60 09/04/2022 0347   GFRAA 123 10/12/2020 1526   Lipase     Component Value Date/Time   LIPASE 39 08/16/2022 0506    Studies/Results: No results found.  Anti-infectives: Anti-infectives (From admission, onward)    Start     Dose/Rate Route Frequency Ordered Stop   08/27/22 1300  Ampicillin-Sulbactam (UNASYN) 3 g in sodium chloride 0.9 % 100 mL IVPB        3 g 200 mL/hr over 30 Minutes Intravenous Every 8 hours 08/27/22 1202 08/29/22 0447   08/17/22 1630  piperacillin-tazobactam (ZOSYN) IVPB 3.375 g  Status:  Discontinued        3.375 g 12.5 mL/hr over 240 Minutes Intravenous Every 8 hours 08/17/22 1540 08/27/22 1202   08/16/22 1500  piperacillin-tazobactam (ZOSYN) IVPB 3.375 g        3.375 g 100 mL/hr over 30 Minutes Intravenous STAT 08/16/22 1446 08/16/22 2032  08/16/22 0615  piperacillin-tazobactam (ZOSYN) IVPB 3.375 g        3.375 g 100 mL/hr over 30 Minutes Intravenous  Once 08/16/22 0609 08/16/22 0713        Assessment/Plan POD 19, s/p ex lap partial sigmoid colectomy, colostomy by Dr. Oda Cogan on 9/15 for Perforated stercoral ulcer - History of chronic pain on suboxone. Off PCA. On scheduled oxycontin today '15mg'$  BID and 5 mg oxycodone for breakthrough.  This has help significantly. - Cont soft diet and shakes. Wean tpn to 1/2. Plan to wean off in am if still tolerating diet and having bowel function.  - Enema through ostomy 10/2 and 10/3. Having bowel function now. Continue po bowel regimen.  - CT w/ extraperitoneal fluid collection in LLQ lateral to psoas. S/p IR drain  9/23. Cx with E. Coli. Narrowed to Unasyn. Comp abx 5d after drainage (through 9/28). CT 9/29 with improvement (IR following). No new intra-abdominal abscess. Output low, if remains low anticipate IR may re-eval.  - Surgical JP drain out 9/28 - Continue Mepitel at base of midline with wtd dressings over bid  - WOC RN following for new ostomy - Encourage ambulation and IS use   FEN - Soft, IVFs, 1/2 TPN VTE - SCDs, Lovenox ID - zosyn 9/14 - 9/26. Unasyn 9/26 - 9/28. None currently. WBC 14.6 (up from 12.5) Foley - out 9/18. voiding   Per primary Tobacco abuse/COPD - 2 ppd H/O substance abuse H/O opioid dependence - now on suboxone prn H/O seizure d/o Chronic pancreatitis Zollinger-Ellison syndrome GERD Hypoalbuminemic - appears malnourished at baseline due to muscle wasting RA - mtx and enbrel held   LOS: 19 days    Jillyn Ledger , [facility] Surgery 09/04/2022, 8:51 AM Please see Amion for pager number during day hours 7:00am-4:30pm

## 2022-09-04 NOTE — Progress Notes (Signed)
Supervising Physician: Sandi Mariscal  Patient Status:  [facility] - In-pt  Chief Complaint:  S/p left retroperitoneal abscess drainage  Subjective:  Patient awake and watching TV in bed at time of exam. He states he is not in pain unless he rolls on his side or accidentally pulls at his drain. RN at bedside changing dressing at time of exam.  Allergies: Ambien [zolpidem tartrate], Iohexol, Lunesta [eszopiclone], Seroquel [quetiapine fumarate], and Sonata [zaleplon]  Medications: Prior to Admission medications   Medication Sig Start Date End Date Taking? Authorizing Provider  amitriptyline (ELAVIL) 50 MG tablet Take 100 mg by mouth at bedtime.   Yes [provider]  atorvastatin (LIPITOR) 20 MG tablet Take 1 tablet (20 mg total) by mouth daily. 02/19/21  Yes Just, Laurita Quint, FNP  buprenorphine-naloxone (SUBOXONE) 8-2 mg SUBL SL tablet Place 1 tablet under the tongue daily.   Yes [provider]  cyclobenzaprine (FLEXERIL) 10 MG tablet Take 1 tablet (10 mg total) by mouth 3 (three) times daily. 02/19/21  Yes Just, Laurita Quint, FNP  etanercept (ENBREL) 50 MG/ML injection Inject 50 mg into the skin once a week.   Yes [provider]  folic acid (FOLVITE) 1 MG tablet Take 1 mg by mouth daily. 08/06/22  Yes [provider]  linaclotide Rolan Lipa) 145 MCG CAPS capsule Take 1 capsule (145 mcg total) by mouth daily before breakfast. 02/19/21  Yes Just, Laurita Quint, FNP  LORazepam (ATIVAN) 1 MG tablet Take 1 mg by mouth 3 (three) times daily. 07/18/22  Yes [provider]  methotrexate (RHEUMATREX) 2.5 MG tablet Take 10 mg by mouth once a week. 06/08/22  Yes [provider]  mirtazapine (REMERON) 45 MG tablet Take 1 tablet (45 mg total) by mouth at bedtime. 02/19/21  Yes Just, Laurita Quint, FNP  omeprazole (PRILOSEC) 20 MG capsule TAKE 1 CAPSULE(20 MG) BY MOUTH DAILY 02/19/21  Yes Just, Laurita Quint, FNP  traZODone (DESYREL) 50 MG tablet Take 3-4 tablets (150-200 mg  total) by mouth at bedtime. 02/19/21  Yes Just, Laurita Quint, FNP     Vital Signs: BP 105/61 (BP Location: Left Arm)   Pulse (!) 108   Temp 98.4 F (36.9 C) (Oral)   Resp 16   Ht 5' 5.5" (1.664 m)   Wt 103 lb (46.7 kg)   SpO2 97%   BMI 16.88 kg/m   Physical Exam Constitutional:      General: He is not in acute distress. HENT:     Head: Normocephalic.  Pulmonary:     Effort: Pulmonary effort is normal.  Abdominal:     Palpations: Abdomen is soft.  Neurological:     Mental Status: He is alert.    Drain Location: LLQ Size: Fr size: 12 Fr Date of placement: 08/24/22  Currently to: Drain collection device: suction bulb 24 hour output:  Output by Drain (mL) 09/02/22 0701 - 09/02/22 1900 09/02/22 1901 - 09/03/22 0700 09/03/22 0701 - 09/03/22 1900 09/03/22 1901 - 09/04/22 0700 09/04/22 0701 - 09/04/22 1017  Closed System Drain 1 Lateral LLQ Bulb (JP) 10 15   0     Current examination: Approximately 5cc of purulent material in JP bulb at time of exam. Flushes/aspirates easily.  Insertion site unremarkable. No erythema, bleeding, drainage, or induration noted. Suture and stat lock in place. Dressed appropriately. Dressing C/D/I.   Imaging: No results found.  Labs:  CBC: Recent Labs    08/29/22 1211 08/30/22 0440 09/01/22 0429 09/03/22 1456  WBC  19.3* 15.3* 12.5* 14.6*  HGB 9.7* 10.2* 9.6* 9.8*  HCT 30.3* 30.6* 29.3* 29.8*  PLT 633* 598* 527* 441*    COAGS: Recent Labs    08/24/22 1415  INR 1.1    BMP: Recent Labs    08/29/22 0308 08/30/22 0440 09/02/22 0423 09/04/22 0347  NA 140 136 136 137  K 4.0 3.9 4.1 4.2  CL 106 105 104 102  CO2 '25 23 26 27  '$ GLUCOSE 130* 134* 123* 117*  BUN '13 11 14 17  '$ CALCIUM 8.6* 8.7* 8.7* 8.6*  CREATININE 0.49* 0.43* 0.48* 0.53*  GFRNONAA >60 >60 >60 >60    LIVER FUNCTION TESTS: Recent Labs    08/25/22 0325 08/26/22 0319 08/29/22 0308 09/02/22 0423  BILITOT 0.4 0.5 0.3 0.2*  AST '16 18 19 15  '$ ALT '12 13 14 14   '$ ALKPHOS 74 82 76 72  PROT 6.3* 7.0 7.3 7.8  ALBUMIN 1.8* 2.0* 2.2* 2.5*   Assessment:  Bowel perforation 11 days s/p drain placement. Patient is stable. IR will continue to follow.  Plan: Continue TID flushes with 5 cc NS. Record output Q shift. Dressing changes QD or PRN if soiled.  Call IR APP or on call IR MD if difficulty flushing or sudden change in drain output.  Repeat imaging/possible drain injection once output < 10 mL/QD (excluding flush material). Consideration for drain removal if output is < 10 mL/QD (excluding flush material), pending discussion with the providing surgical service.  Discharge planning: Please contact IR APP or on call IR MD prior to patient d/c to ensure appropriate follow up plans are in place. Typically patient will follow up with IR clinic 10-14 days post d/c for repeat imaging/possible drain injection. IR scheduler will contact patient with date/time of appointment. Patient will need to flush drain QD with 5 cc NS, record output QD, dressing changes every 2-3 days or earlier if soiled.   IR will continue to follow - please call with questions or concerns.  Electronically Signed: Lura Em, PA-C 09/04/2022, 10:13 AM   I spent a total of 15 Minutes at the the patient's bedside AND on the patient's hospital floor or unit, greater than 50% of which was counseling/coordinating care for drain management.

## 2022-09-04 NOTE — Progress Notes (Addendum)
Nutrition Follow-up  DOCUMENTATION CODES:  Severe malnutrition in context of chronic illness  INTERVENTION:  TPN management per pharmacy Continue current diet as ordered Encourage PO intake Ensure Enlive po BID, each supplement provides 350 kcal and 20 grams of protein. Monitor the result of the following labs which could be impacted by pt's extensive hx of GI surgeries/resections and inhibiting wound healing: Vitamin C, Zinc, Vitamin A, Vitamin D, Thiamin, folate, Vitamin B12, and Vitamin B6 1000IU of vitamin D daily  NUTRITION DIAGNOSIS:  Severe Malnutrition related to chronic illness as evidenced by severe muscle depletion, severe fat depletion. -remains applicable  GOAL:  Patient will meet greater than or equal to 90% of their needs - being addressed with TPN  MONITOR:  Diet advancement, Labs, I & O's, Weight trends  REASON FOR ASSESSMENT:  Consult New TPN/TNA  ASSESSMENT:  61 y.o. male presented to the ED with abdominal pain. PMH includes COPD, GERD, chronic pancreatitis, polysubstance abuse, and tobacco abuse. Pt admitted with sepsis 2/2 bowel perforation requiring emergent ex-lap.   9/15 - Ex-lap s/p partial sigmoid colectomy, colostomy 9/18 - NGT removed 9/19 - TPN started   9/23 - IR placed drain for left retroperitoneal abscess  Pt ambulating in hall at the time of assessment. TPN weaned to 50% with plans of discontinuing tomorrow if pt continues to tolerate diet. Pt refusing most of his ensures. RN reports he is utilizing pain medications around the clock which may be a barrier to discharge.   Noted Vitamin D slightly low, will add daily supplement  Nutritionally Relevant Medications: Scheduled Meds:  atorvastatin  20 mg Oral Daily   docusate sodium  100 mg Oral BID   feeding supplement  237 mL Oral TID BM   pantoprazole  40 mg Oral Daily   polyethylene glycol  17 g Oral BID   Continuous Infusions:  TPN ADULT (ION) 70 mL/hr at 08/30/22 0553   PRN Meds:  diphenhydrAMINE, ondansetron  Labs Reviewed Creatinine .25 CBG ranges from 109-150 mg/dL over the last 24 hours  Micronutrient Labs: Vitamin A 38.5 (normal) Vitamin B12 1090 (high) Vitamin D 28.02 (low) Folate 18.2 Thiamine pending Vitamin C pending  NUTRITION - FOCUSED PHYSICAL EXAM: Flowsheet Row Most Recent Value  Orbital Region Severe depletion  Upper Arm Region Severe depletion  Thoracic and Lumbar Region Severe depletion  Buccal Region Severe depletion  Temple Region Moderate depletion  Clavicle Bone Region Severe depletion  Clavicle and Acromion Bone Region Severe depletion  Scapular Bone Region Severe depletion  Dorsal Hand Mild depletion  Patellar Region Unable to assess  Anterior Thigh Region Unable to assess  Posterior Calf Region Unable to assess  Edema (RD Assessment) None  Hair Reviewed  Eyes Reviewed  Mouth Reviewed  Skin Reviewed  Nails Reviewed   Diet Order:   Diet Order             DIET SOFT Room service appropriate? Yes; Fluid consistency: Thin  Diet effective now                   EDUCATION NEEDS:  No education needs have been identified at this time  Skin:  Skin Assessment: Reviewed RN Assessment  Last BM:  10/3 - type 7 via colostomy  Height:  Ht Readings from Last 1 Encounters:  08/16/22 5' 5.5" (1.664 m)   Weight:  Wt Readings from Last 1 Encounters:  09/02/22 46.7 kg    Ideal Body Weight:  64.6 kg  BMI:  Body mass index  is 16.88 kg/m.  Estimated Nutritional Needs:  Kcal:  1900-2100 kcal/d Protein:  90-110g/d Fluid:  >/= 1.7 L   Ranell Patrick, RD, LDN Clinical Dietitian RD pager # available in [facility]  After hours/weekend pager # available in [facility]

## 2022-09-04 NOTE — Progress Notes (Signed)
PROGRESS NOTE    Jerry Knight  XTG:[id number] DOB: 1961/08/25 DOA: 08/16/2022 PCP: Beverley Fiedler, FNP   Brief Narrative:  61 y.o. male with medical history significant of  COPD, chronic pancreatitis, rheumatoid arthritis on Enbrel, history of seizures not on an AED, chronic pain on Suboxone, polysubstance abuse, Zollinger-Ellison syndrome, and constipation who presented with complaints of  lower abdominal pain.  Imaging remarkable for colonic perforation/obstruction  Patient underwent emergent exploratory laparotomy, left colostomy and partial sigmoid colostomy placement on 9/15 and thereafter also requiring left lower quadrant drain by IR on 9/23.  Patient did complete Zosyn followed by Unasyn from 9/14 - 9/28.  Currently getting TPN.  Surgery team working on increasing ostomy output by trying bowel regimen and slowly advancing diet   Assessment & Plan:  Principal Problem:   Bowel perforation (HCC) Active Problems:   Sepsis ([place])   Normocytic anemia   Hypokalemia   COPD (chronic obstructive pulmonary disease) (HCC)   Rheumatoid arthritis (HCC)   Anxiety   GERD (gastroesophageal reflux disease)   Chronic neck and back pain   Tobacco abuse   Protein-calorie malnutrition, severe   Sepsis secondary to bowel perforation (HCC) due to stercoral ulcer/stool impaction, left  retroperitoneal abscess -Upon admission patient met sepsis criteria.  Underwent emergent laparotomy, left colostomy and partial sigmoid colectomy on 9/15.  Also required left lower quadrant drain placement by IR on 9/23.  Completed course of Zosyn followed by Unasyn from 9/14 - 9/28.  -IR not recommending outpatient follow-up in 10-14 days.Patient will need to flush drain QD with 5 cc NS, record output QD, dressing changes every 2-3 days or earlier if soiled. Notify IR prior to dc so they can schedule f/u - General surgery is following.  Slowly advancing diet - Pain control, bowel regimen   Acute on chronic  pain, fluctuating - On outpatient Suboxone.  Now off PCA.  Currently on OxyContin 15 mg twice daily, as needed oxycodone and Zanaflex.  Normocytic anemia -Likely anemia of chronic disease -HTN stable 9-10   Hypokalemia, hyponatremia -On TPN.  As needed repletion.  Hopefully we can slowly take him off TPN   COPD, without exacerbation - Chest x-ray with chronic interstitial lung changes with bibasilar atelectasis. -Stable, no wheezing, continue albuterol nebs as needed   History of rheumatoid arthritis -Outpatient on Enbrel 50 mg and methotrexate 10 mg on Saturdays -Currently regimen on hold   Insomnia, anxiety -For now continue Ativan as needed for anxiety -Continue trazodone 150 mg at bedtime    GERD -Continue PPI   Tobacco abuse Patient reportedly smokes 2 packs of cigarettes per day on average. - cont Nicotine patch   7 mm right lower lobe pulmonary nodule. Non-contrast chest CT at 6-12 months is recommended. If the nodule is stable at time of repeat CT, then future CT at 18-24 months (from today's scan) is considered optional for low-risk patients, but is recommended for high-risk patients   Moderate to severe protein calorie malnutrition due to acute illness, hypoalbuminemia 1.9, muscle wasting Estimated body mass index is 16.88 kg/m as calculated from the following:   Height as of this encounter: 5' 5.5" (1.664 m).   Weight as of this encounter: 46.7 kg. -Currently on TPN     DVT prophylaxis: enoxaparin (LOVENOX) injection 40 mg Start: 08/25/22 1000 SCD's Start: 08/16/22 1754 SCDs Start: 08/16/22 1600 Code Status: Full Code Family Communication:    Status is: Inpatient, maintain hospital stay until cleared by general surgery  Nutritional status  Signs/Symptoms: severe muscle depletion, severe fat depletion  Interventions: TPN  Body mass index is 16.88 kg/m.         Subjective: Seen and examined at bedside.  Some output from his ostomy.   Currently tolerating soft diet. He continues to mobilize well  Examination: Constitutional: Not in acute distress Respiratory: Clear to auscultation bilaterally Cardiovascular: Normal sinus rhythm, no rubs Abdomen: Nontender nondistended good bowel sounds Musculoskeletal: No edema noted Skin: Abdomen is soft and very mildly distended.  No obvious evidence of bleeding noted.  Incision noted. Neurologic: CN 2-12 grossly intact.  And nonfocal Psychiatric: Normal judgment and insight. Alert and oriented x 3. Normal mood.   RUE PICC 9/19 LUQ Colostomy LLQ Drain     Objective: Vitals:   09/03/22 1757 09/03/22 2017 09/04/22 0504 09/04/22 0758  BP: (!) 88/63 116/70 119/65 105/61  Pulse: 97 97 89 (!) 108  Resp: _0 Temp: 98.7 F (37.1 C) 97.8 F (36.6 C) 98.1 F (36.7 C) 98.4 F (36.9 C)  TempSrc: Oral Oral Oral Oral  SpO2: 97% 97% 97% 97%  Weight:      Height:        Intake/Output Summary (Last 24 hours) at 09/04/2022 0818 Last data filed at 09/04/2022 0601 Gross per 24 hour  Intake 1924.42 ml  Output 2150 ml  Net -225.58 ml   Filed Weights   08/31/22 0336 09/01/22 0533 09/02/22 0500  Weight: 49.4 kg 50.2 kg 46.7 kg     Data Reviewed:   CBC: Recent Labs  Lab 08/29/22 1211 08/30/22 0440 09/01/22 0429 09/03/22 1456  WBC 19.3* 15.3* 12.5* 14.6*  HGB 9.7* 10.2* 9.6* 9.8*  HCT 30.3* 30.6* 29.3* 29.8*  MCV 95.0 92.4 93.6 93.7  PLT 633* 598* 527* 176*   Basic Metabolic Panel: Recent Labs  Lab 08/29/22 0308 08/30/22 0440 09/02/22 0423 09/04/22 0347  NA 140 136 136 137  K 4.0 3.9 4.1 4.2  CL 106 105 104 102  CO2 _1 GLUCOSE 130* 134* 123* 117*  BUN _2 CREATININE 0.49* 0.43* 0.48* 0.53*  CALCIUM 8.6* 8.7* 8.7* 8.6*  MG 1.9  --  1.8 1.8  PHOS 3.0 3.4 3.8  --    GFR: Estimated Creatinine Clearance: 65.7 mL/min (A) (by C-G formula based on SCr of 0.53 mg/dL (L)). Liver Function Tests: Recent Labs  Lab 08/29/22 0308  09/02/22 0423  AST 19 15  ALT 14 14  ALKPHOS 76 72  BILITOT 0.3 0.2*  PROT 7.3 7.8  ALBUMIN 2.2* 2.5*   No results for input(s): "LIPASE", "AMYLASE" in the last 168 hours. No results for input(s): "AMMONIA" in the last 168 hours. Coagulation Profile: No results for input(s): "INR", "PROTIME" in the last 168 hours. Cardiac Enzymes: No results for input(s): "CKTOTAL", "CKMB", "CKMBINDEX", "TROPONINI" in the last 168 hours. BNP (last 3 results) No results for input(s): "PROBNP" in the last 8760 hours. HbA1C: No results for input(s): "HGBA1C" in the last 72 hours. CBG: Recent Labs  Lab 09/03/22 0554 09/03/22 0830 09/03/22 1152 09/03/22 1755 09/04/22 0506  GLUCAP 129* 122* 116* 116* 127*   Lipid Profile: Recent Labs    09/02/22 0423  TRIG 86   Thyroid Function Tests: No results for input(s): "TSH", "T4TOTAL", "FREET4", "T3FREE", "THYROIDAB" in the last 72 hours. Anemia Panel: No results for input(s): "VITAMINB12", "FOLATE", "FERRITIN", "TIBC", "IRON", "RETICCTPCT" in the last 72 hours. Sepsis Labs: No results for input(s): "PROCALCITON", "LATICACIDVEN" in the last  168 hours.  No results found for this or any previous visit (from the past 240 hour(s)).        Radiology Studies: No results found.      Scheduled Meds:  acetaminophen  1,000 mg Oral Q6H   atorvastatin  20 mg Oral Daily   Chlorhexidine Gluconate Cloth  6 each Topical Daily   docusate sodium  100 mg Oral BID   enoxaparin (LOVENOX) injection  40 mg Subcutaneous Q24H   feeding supplement  237 mL Oral TID BM   lidocaine  1 patch Transdermal Q24H   magnesium hydroxide  15 mL Oral Daily   nicotine  21 mg Transdermal QHS   oxyCODONE  15 mg Oral Q12H   pantoprazole  40 mg Oral Daily   polyethylene glycol  17 g Oral BID   sodium chloride flush  3 mL Intravenous Q12H   sodium chloride flush  5 mL Intracatheter Q8H   sodium phosphate  1 enema Rectal Once   tiZANidine  2 mg Oral Q8H   traZODone  150  mg Oral QHS   Continuous Infusions:  chlorproMAZINE (THORAZINE) 12.5 mg in sodium chloride 0.9 % 25 mL IVPB 12.5 mg (09/04/22 0455)   TPN ADULT (ION) 80 mL/hr at 09/03/22 1721     LOS: 19 days   Time spent= 35 mins     Arsenio Loader, MD [facility]  If 7PM-7AM, please contact night-coverage  09/04/2022, 8:18 AM

## 2022-09-04 NOTE — Progress Notes (Signed)
PHARMACY - TOTAL PARENTERAL NUTRITION CONSULT NOTE   Indication: Prolonged ileus  Patient Measurements: Height: 5' 5.5" (166.4 cm) Weight: 46.7 kg (103 lb) IBW/kg (Calculated) : 62.65 TPN AdjBW (KG): 52.6 Body mass index is 16.88 kg/m.  Usual Weight: 115 lbs  Assessment:  61 yo male with a history of chronic pain syndrome on Suboxone, chronic pancreatitis with prior surgery for pseudocyst removal in the 1990s, prior BII in the 1980s, COPD, rheumatoid arthritis on Enbrel, history of seizures not on an AED, and Zollinger-Ellison syndrome who presented with acute onset of abdominal pain along with N/V. He was found to have a WBC of 22K, lactic acid of 3.1 and a CT scan concerning for a colonic perforation. Pharmacy consulted to initiate TPN for prolonged ileus.  Glucose / Insulin: no hx DM, CBGs controlled   Electrolytes:  K 4.2, CoCa 9.8, Mg 1.8 Renal: Scr 0.53 stable (bsl < 1); BUN wnl Hepatic: LFTs / TG wnl. Tbili 0.2, Albumin 2.2 Intake / Output; MIVF: UOP 1.5 ml/kg/hr; 500 ml colostomy output GI Imaging: 9/15 colonic perforation in the region of the sigmoid colon likely secondary to impacted stool 9/29 CT abd/pelvis: no obstruction or drainable fluid GI Surgeries / Procedures:  9/15 Exploratory laparotomy, Left colostomy and partial sigmoid colectomy  9/23: drain placed for L retroperitoneal abscess (12m fluid aspirated)  Central access: 08/20/22 TPN start date:  08/20/22  Nutritional Goals: Goal TPN rate is 80 mL/hr (provides 100 g of protein and 1993 kcals per day)  RD Assessment: Estimated Needs Total Energy Estimated Needs: 1900-2100 kcal/d Total Protein Estimated Needs: 90-110g/d Total Fluid Estimated Needs: >/= 1.7 L   Current Nutrition:  TPN Soft diet Ensure TID - 0 charted as given  Plan:  Decrease to half TPN with rate of 465mhr at 1800 TPN will provide 50g protein and 997 kcal, meeting 50% of patient needs Electrolytes in TPN: Na 150 mEq/L, K 45 mEq/L, Ca  2 mEq/L. Mg 10 mEq/L, Phos 16 mmol/L. Cl:Ac 1:1 (all will be half due to half-rate) Add standard MVI and trace elements to TPN Monitor CBGs Q6H - continue off SSI with CBGs controlled Monitor standard TPN labs on Mon/Thurs and as needed Anticipate discontinuing TPN on 10/5 if still tolerating oral diet  Thank you for allowing pharmacy to be a part of this patient's care.  LoErskine SpeedPharmD Clinical Pharmacist 09/04/2022, 9:09 AM

## 2022-09-04 NOTE — Progress Notes (Signed)
PT Cancellation Note  Patient Details Name: Jerry Knight MRN: [id number] DOB: [birth year]   Cancelled Treatment:    Reason Eval/Treat Not Completed: Other (comment).  Per pt and nsg, pt is having a bad day and reports having walked several times already.  Re-attempt tomorrow.   Ramond Dial 09/04/2022, 3:23 PM  Mee Hives, PT PhD Acute Rehab Dept. Number: [place] [facility] [place]

## 2022-09-05 DIAGNOSIS — K631 Perforation of intestine (nontraumatic): Secondary | ICD-10-CM | POA: Diagnosis not present

## 2022-09-05 LAB — MAGNESIUM: Magnesium: 1.7 mg/dL (ref 1.7–2.4)

## 2022-09-05 LAB — COMPREHENSIVE METABOLIC PANEL
ALT: 12 U/L (ref 0–44)
AST: 16 U/L (ref 15–41)
Albumin: 2.5 g/dL — ABNORMAL LOW (ref 3.5–5.0)
Alkaline Phosphatase: 66 U/L (ref 38–126)
Anion gap: 8 (ref 5–15)
BUN: 16 mg/dL (ref 6–20)
CO2: 24 mmol/L (ref 22–32)
Calcium: 8.4 mg/dL — ABNORMAL LOW (ref 8.9–10.3)
Chloride: 103 mmol/L (ref 98–111)
Creatinine, Ser: 0.58 mg/dL — ABNORMAL LOW (ref 0.61–1.24)
GFR, Estimated: >60 mL/min (ref >60)
Glucose, Bld: 105 mg/dL — ABNORMAL HIGH (ref 70–99)
Potassium: 4.1 mmol/L (ref 3.5–5.1)
Sodium: 135 mmol/L (ref 135–145)
Total Bilirubin: 0.4 mg/dL (ref 0.3–1.2)
Total Protein: 6.6 g/dL (ref 6.5–8.1)

## 2022-09-05 LAB — CBC
HCT: 27.5 % — ABNORMAL LOW (ref 39.0–52.0)
Hemoglobin: 9 g/dL — ABNORMAL LOW (ref 13.0–17.0)
MCH: 31.1 pg (ref 26.0–34.0)
MCHC: 32.7 g/dL (ref 30.0–36.0)
MCV: 95.2 fL (ref 80.0–100.0)
Platelets: 322 10*3/uL (ref 150–400)
RBC: 2.89 MIL/uL — ABNORMAL LOW (ref 4.22–5.81)
RDW: 14.5 % (ref 11.5–15.5)
WBC: 10.7 10*3/uL — ABNORMAL HIGH (ref 4.0–10.5)
nRBC: 0 % (ref 0.0–0.2)

## 2022-09-05 LAB — PHOSPHORUS: Phosphorus: 4 mg/dL (ref 2.5–4.6)

## 2022-09-05 LAB — TRIGLYCERIDES: Triglycerides: 62 mg/dL (ref <150)

## 2022-09-05 LAB — GLUCOSE, CAPILLARY: Glucose-Capillary: 111 mg/dL — ABNORMAL HIGH (ref 70–99)

## 2022-09-05 MED ORDER — MORPHINE SULFATE (PF) 2 MG/ML IV SOLN
2.0000 mg | Freq: Once | INTRAVENOUS | Status: AC
  Administered 2022-09-05: 2 mg via INTRAVENOUS
  Filled 2022-09-05: qty 1

## 2022-09-05 MED ORDER — ENSURE MAX PROTEIN PO LIQD
11.0000 [oz_av] | Freq: Every day | ORAL | Status: DC
  Administered 2022-09-06 – 2022-09-10 (×3): 11 [oz_av] via ORAL
  Filled 2022-09-05 (×7): qty 330

## 2022-09-05 MED ORDER — GLUCERNA SHAKE PO LIQD
237.0000 mL | Freq: Two times a day (BID) | ORAL | Status: DC
  Administered 2022-09-05 – 2022-09-11 (×7): 237 mL via ORAL

## 2022-09-05 MED ORDER — GABAPENTIN 600 MG PO TABS
300.0000 mg | ORAL_TABLET | Freq: Two times a day (BID) | ORAL | Status: DC
  Administered 2022-09-05: 300 mg via ORAL
  Filled 2022-09-05 (×3): qty 1

## 2022-09-05 MED ORDER — TRAVASOL 10 % IV SOLN
INTRAVENOUS | Status: AC
  Filled 2022-09-05: qty 499.2

## 2022-09-05 MED ORDER — ADULT MULTIVITAMIN W/MINERALS CH
1.0000 | ORAL_TABLET | Freq: Every day | ORAL | Status: DC
  Administered 2022-09-05 – 2022-09-11 (×7): 1 via ORAL
  Filled 2022-09-05 (×7): qty 1

## 2022-09-05 MED ORDER — ACETAMINOPHEN 500 MG PO TABS: 1000.0000 mg | ORAL_TABLET | Freq: Three times a day (TID) | ORAL | Status: DC | PRN

## 2022-09-05 NOTE — Progress Notes (Addendum)
20 Days Post-Op  Subjective: CC: Doing well with weaning IV pain medication. We increased oxy scale yesterday and spaced out IV morphine. Feels pain is well controlled and actually improving. Mainly upper abdomen and left side today. Tolerating soft diet without n/v and finishing 50-75% of his trays. Not drinking ensures - reports shakes that are to high in calorie don't settle well with him? RD following, will ask them to look into this to see if there is a better option. Having air from ostomy bag but no further output since I saw him yesterday. Nothing on I/O. Mobilizing in halls. Voiding.   Objective: Vital signs in last 24 hours: Temp:  [98 F (36.7 C)-98.3 F (36.8 C)] 98 F (36.7 C) (10/05 0734) Pulse Rate:  [90-104] 100 (10/05 0734) Resp:  [16] 16 (10/05 0734) BP: (103-131)/(67-69) 110/69 (10/05 0734) SpO2:  [95 %-97 %] 97 % (10/05 0734) Last BM Date : 09/04/22  Intake/Output from previous day: 10/04 0701 - 10/05 0700 In: 1537 [P.O.:340; I.V.:1132; IV Piggyback:50] Out: 1950 [Urine:1200; Drains:15] Intake/Output this shift: Total I/O In: -  Out: 500 [Urine:500]  PE: General: pleasant, WD, male who is laying in bed in NAD Lungs: Respiratory effort nonlabored Abd: Soft, mild distention. Some mild left sided ttp reported on exam without rigidity or guarding - otherwise NT. Colostomy with small amount of air and thin brown liquid stool in bag. Stoma pink and viable. Surgical JP drain site with dressing in place, cdi. IR drain with thinner, more clear output - 15cc/24 hours. Midline wound with stable separation as noted on prior pictures/exam. Most of the wound with layer of granulation tissue overlying and less fibrinous exudate compared to prior   Lab Results:  Recent Labs    09/03/22 1456 09/05/22 0336  WBC 14.6* 10.7*  HGB 9.8* 9.0*  HCT 29.8* 27.5*  PLT 441* 322   BMET Recent Labs    09/04/22 0347 09/05/22 0336  NA 137 135  K 4.2 4.1  CL 102 103  CO2 27  24  GLUCOSE 117* 105*  BUN 17 16  CREATININE 0.53* 0.58*  CALCIUM 8.6* 8.4*   PT/INR No results for input(s): "LABPROT", "INR" in the last 72 hours. CMP     Component Value Date/Time   NA 135 09/05/2022 0336   NA 140 10/12/2020 1526   K 4.1 09/05/2022 0336   CL 103 09/05/2022 0336   CO2 24 09/05/2022 0336   GLUCOSE 105 (H) 09/05/2022 0336   BUN 16 09/05/2022 0336   BUN 10 10/12/2020 1526   CREATININE 0.58 (L) 09/05/2022 0336   CREATININE 0.77 01/27/2013 1105   CALCIUM 8.4 (L) 09/05/2022 0336   PROT 6.6 09/05/2022 0336   PROT 7.5 10/12/2020 1526   ALBUMIN 2.5 (L) 09/05/2022 0336   ALBUMIN 4.5 10/12/2020 1526   AST 16 09/05/2022 0336   ALT 12 09/05/2022 0336   ALKPHOS 66 09/05/2022 0336   BILITOT 0.4 09/05/2022 0336   BILITOT 0.3 10/12/2020 1526   GFRNONAA >60 09/05/2022 0336   GFRAA 123 10/12/2020 1526   Lipase     Component Value Date/Time   LIPASE 39 08/16/2022 0506    Studies/Results: No results found.  Anti-infectives: Anti-infectives (From admission, onward)    Start     Dose/Rate Route Frequency Ordered Stop   08/27/22 1300  Ampicillin-Sulbactam (UNASYN) 3 g in sodium chloride 0.9 % 100 mL IVPB        3 g 200 mL/hr over 30 Minutes Intravenous Every  8 hours 08/27/22 1202 08/29/22 0447   08/17/22 1630  piperacillin-tazobactam (ZOSYN) IVPB 3.375 g  Status:  Discontinued        3.375 g 12.5 mL/hr over 240 Minutes Intravenous Every 8 hours 08/17/22 1540 08/27/22 1202   08/16/22 1500  piperacillin-tazobactam (ZOSYN) IVPB 3.375 g        3.375 g 100 mL/hr over 30 Minutes Intravenous STAT 08/16/22 1446 08/16/22 2032   08/16/22 0615  piperacillin-tazobactam (ZOSYN) IVPB 3.375 g        3.375 g 100 mL/hr over 30 Minutes Intravenous  Once 08/16/22 0609 08/16/22 0713        Assessment/Plan POD 20, s/p ex lap partial sigmoid colectomy, colostomy by Dr. Oda Cogan on 9/15 for Perforated stercoral ulcer - History of chronic pain on suboxone. Off PCA. On scheduled  oxycontin today '15mg'$  BID and 5-10 mg oxycodone for breakthrough.  This has help significantly. Weaning IV pain medication - discussed with patient.  - Cont soft diet and shakes. Had some ostomy output the other day but not much in the last 24 hours. Would cont 1/2 tpn till proves he has true robf - Enema through ostomy 10/2 and 10/3.  Continue po bowel regimen. Repeat today - CT w/ extraperitoneal fluid collection in LLQ lateral to psoas. S/p IR drain 9/23. Cx with E. Coli. Narrowed to Unasyn. Comp abx 5d after drainage (through 9/28). CT 9/29 with improvement (IR following). No new intra-abdominal abscess. IR drain output low, if remains low anticipate IR may re-eval for possible removal - they are following - Surgical JP drain out 9/28 - Continue Mepitel at base of midline with wtd dressings over bid  - WOC RN following for new ostomy - Encourage ambulation and IS use   FEN - Soft, IVFs, 1/2 TPN (may d/c). Bowel regimen VTE - SCDs, Lovenox ID - zosyn 9/14 - 9/26. Unasyn 9/26 - 9/28. None currently. WBC down at 10.7 Foley - out 9/18. voiding   Per primary Tobacco abuse/COPD - 2 ppd H/O substance abuse H/O opioid dependence - now on suboxone prn H/O seizure d/o Chronic pancreatitis Zollinger-Ellison syndrome GERD Hypoalbuminemic - appears malnourished at baseline due to muscle wasting RA - mtx and enbrel held     LOS: 20 days    Jerry Knight , [facility] 09/05/2022, 8:47 AM Please see Amion for pager number during day hours 7:00am-4:30pm

## 2022-09-05 NOTE — Progress Notes (Signed)
1100- wound care performed with patient's wife assisting. Both patient and wife able to verbalize appropriate steps for dressing change. Patient's wife shown how to empty JP drain and assisted in changing colostomy pouch as well.

## 2022-09-05 NOTE — TOC Progression Note (Signed)
Transition of Care [facility]) - Progression Note    Patient Details  Name: Jerry Knight MRN: [id number] Date of Birth: 1961-05-31  [facility]) CM/SW Contact  Jacalyn Lefevre Edson Snowball, RN Phone Number: 09/05/2022, 10:45 AM  Clinical Narrative:     Followed up with patient and wife at bedside.   Discussed home health RN will not be able to make daily visits. Nursing will provide wound care, drain and ostomy teaching here at the hospital prior to discharge. Both voiced understanding. Both stated they have started watching nurses when nurses provide care.   [name] has accepted referral for [facility].    WOC has enrolled patient in program for ostomy supplies. Patient will need script for drain flushes. HH will help with dressing supplies. Nurse will provide some supplies at discharge   Expected Discharge Plan: [place] Barriers to Discharge: Continued Medical Work up  Expected Discharge Plan and Services Expected Discharge Plan: [place]   Discharge Planning Services: CM Consult Post Acute Care Choice: [place] arrangements for the past 2 months: Single Family Home                 DME Arranged: N/A DME Agency: NA       HH Arranged: RN           Social Determinants of Health (SDOH) Interventions    Readmission Risk Interventions     No data to display

## 2022-09-05 NOTE — Progress Notes (Signed)
Mobility Specialist - Progress Note   09/05/22 0900  Mobility  Activity Ambulated independently in hallway  Activity Response Tolerated well  Distance Ambulated (ft) 550 ft  $Mobility charge 1 Mobility  Level of Assistance Modified independent, requires aide device or extra time  Assistive Device Cane    Pt ambulated independently in hallway. No physical assistance needed.   Paulla Dolly Mobility Specialist

## 2022-09-05 NOTE — Consult Note (Addendum)
[place] Nurse ostomy follow up Stoma type/location: LLQ colostomy New order to irrigate stoma again due to persistent lack of stomal output.  Will perform another stomal irrigation.  Cone is lubricated.  Stoma is digitized and a firm, solid ball is felt at 9 o'clock.  Cone is advanced into stoma and 500 ML is slowly instilled into the stoma over several minutes.  Approximately 300 Ml are returned and 200 ML are retained.  The mass is still palpable.  I notified PA, Evette Cristal of findings and we will watch for now and will tentatively plan to irrigate again tomorrow.   I will plan to administer this in the morning if needed.  New 2 piece 2 3/4" pouch and barrier applied.  Will follow.  Domenic Moras MSN, RN, FNP-BC CWON Wound, Ostomy, Continence Nurse Pager [phone]

## 2022-09-05 NOTE — Progress Notes (Signed)
PROGRESS NOTE    Jerry Knight  ZOX:[id number] DOB: 02/09/1961 DOA: 08/16/2022 PCP: Beverley Fiedler, FNP   Brief Narrative:  61 y.o. male with medical history significant of  COPD, chronic pancreatitis, rheumatoid arthritis on Enbrel, history of seizures not on an AED, chronic pain on Suboxone, polysubstance abuse, Zollinger-Ellison syndrome, and constipation who presented with complaints of  lower abdominal pain.  Imaging remarkable for colonic perforation/obstruction  Patient underwent emergent exploratory laparotomy, left colostomy and partial sigmoid colostomy placement on 9/15 and thereafter also requiring left lower quadrant drain by IR on 9/23.  Patient did complete Zosyn followed by Unasyn from 9/14 - 9/28.  Currently getting TPN.  Surgery team working on increasing ostomy output by trying bowel regimen and slowly advancing diet. Encourage ambulation.    Assessment & Plan:  Principal Problem:   Bowel perforation (HCC) Active Problems:   Sepsis ([place])   Normocytic anemia   Hypokalemia   COPD (chronic obstructive pulmonary disease) (HCC)   Rheumatoid arthritis (HCC)   Anxiety   GERD (gastroesophageal reflux disease)   Chronic neck and back pain   Tobacco abuse   Protein-calorie malnutrition, severe   Sepsis secondary to bowel perforation (HCC) due to stercoral ulcer/stool impaction, left  retroperitoneal abscess -Upon admission patient met sepsis criteria.  Underwent emergent laparotomy, left colostomy and partial sigmoid colectomy on 9/15.  Also required left lower quadrant drain placement by IR on 9/23.  Completed course of Zosyn followed by Unasyn from 9/14 - 9/28.  -IR not recommending outpatient follow-up in 10-14 days.Patient will need to flush drain QD with 5 cc NS, record output QD, dressing changes every 2-3 days or earlier if soiled. Notify IR prior to dc so they can schedule f/u - General surgery is following.  Adv diet as tolerated.  - Pain control, bowel  regimen   Acute on chronic pain, fluctuating - On outpatient Suboxone.  Now off PCA.  Currently on OxyContin 15 mg twice daily, as needed oxycodone and Zanaflex. Bowel Regimen. Add Gabapentin.   Normocytic anemia -Likely anemia of chronic disease -HTN stable 9-10   Hypokalemia, hyponatremia -On TPN.  As needed repletion.  Hopefully we can slowly take him off TPN   COPD, without exacerbation - Chest x-ray with chronic interstitial lung changes with bibasilar atelectasis. -Stable, no wheezing, prn Nebs.    History of rheumatoid arthritis -Outpatient on Enbrel 50 mg and methotrexate 10 mg on Saturdays; on Hold.    Insomnia, anxiety -prn Ativan.  -Continue trazodone 150 mg at bedtime    GERD -Continue PPI   Tobacco abuse Patient reportedly smokes 2 packs of cigarettes per day on average. - cont Nicotine patch   7 mm right lower lobe pulmonary nodule. Non-contrast chest CT at 6-12 months is recommended. If the nodule is stable at time of repeat CT, then future CT at 18-24 months (from today's scan) is considered optional for low-risk patients, but is recommended for high-risk patients   Moderate to severe protein calorie malnutrition due to acute illness, hypoalbuminemia 1.9, muscle wasting Estimated body mass index is 16.88 kg/m as calculated from the following:   Height as of this encounter: 5' 5.5" (1.664 m).   Weight as of this encounter: 46.7 kg. -Currently on TPN     DVT prophylaxis: enoxaparin (LOVENOX) injection 40 mg Start: 08/25/22 1000 SCD's Start: 08/16/22 1754 SCDs Start: 08/16/22 1600 Code Status: Full Code Family Communication: Spouse at bedside  Status is: Inpatient, maintain hospital stay until cleared by general surgery  Nutritional status    Signs/Symptoms: severe muscle depletion, severe fat depletion  Interventions: TPN  Body mass index is 16.88 kg/m.         Subjective: Seen and examined at bedside.  Sitting up in the chair,  spouse is at bedside.  Tolerating soft but not completing his food.  Examination: Constitutional: Not in acute distress.  Cachectic frail with bilateral temporal wasting Respiratory: Clear to auscultation bilaterally Cardiovascular: Normal sinus rhythm, no rubs Abdomen: Nontender nondistended good bowel sounds Musculoskeletal: No edema noted Skin: No rashes seen Neurologic: CN 2-12 grossly intact.  And nonfocal Psychiatric: Normal judgment and insight. Alert and oriented x 3. Normal mood.  RUE PICC 9/19 LUQ Colostomy LLQ Drain     Objective: Vitals:   09/04/22 1632 09/04/22 2036 09/05/22 0533 09/05/22 0734  BP: 131/69 103/67 103/69 110/69  Pulse: (!) 103 (!) 104 90 100  Resp: _0 Temp: 98.3 F (36.8 C) 98.2 F (36.8 C) 98.2 F (36.8 C) 98 F (36.7 C)  TempSrc: Oral Oral Oral Oral  SpO2: 97% 97% 95% 97%  Weight:      Height:        Intake/Output Summary (Last 24 hours) at 09/05/2022 0831 Last data filed at 09/05/2022 0730 Gross per 24 hour  Intake 1537.02 ml  Output 1115 ml  Net 422.02 ml   Filed Weights   08/31/22 0336 09/01/22 0533 09/02/22 0500  Weight: 49.4 kg 50.2 kg 46.7 kg     Data Reviewed:   CBC: Recent Labs  Lab 08/29/22 1211 08/30/22 0440 09/01/22 0429 09/03/22 1456 09/05/22 0336  WBC 19.3* 15.3* 12.5* 14.6* 10.7*  HGB 9.7* 10.2* 9.6* 9.8* 9.0*  HCT 30.3* 30.6* 29.3* 29.8* 27.5*  MCV 95.0 92.4 93.6 93.7 95.2  PLT 633* 598* 527* 441* 330   Basic Metabolic Panel: Recent Labs  Lab 08/30/22 0440 09/02/22 0423 09/04/22 0347 09/05/22 0336  NA 136 136 137 135  K 3.9 4.1 4.2 4.1  CL 105 104 102 103  CO2 _1 GLUCOSE 134* 123* 117* 105*  BUN _2 CREATININE 0.43* 0.48* 0.53* 0.58*  CALCIUM 8.7* 8.7* 8.6* 8.4*  MG  --  1.8 1.8 1.7  PHOS 3.4 3.8  --  4.0   GFR: Estimated Creatinine Clearance: 65.7 mL/min (A) (by C-G formula based on SCr of 0.58 mg/dL (L)). Liver Function Tests: Recent Labs  Lab 09/02/22 0423  09/05/22 0336  AST 15 16  ALT 14 12  ALKPHOS 72 66  BILITOT 0.2* 0.4  PROT 7.8 6.6  ALBUMIN 2.5* 2.5*   No results for input(s): "LIPASE", "AMYLASE" in the last 168 hours. No results for input(s): "AMMONIA" in the last 168 hours. Coagulation Profile: No results for input(s): "INR", "PROTIME" in the last 168 hours. Cardiac Enzymes: No results for input(s): "CKTOTAL", "CKMB", "CKMBINDEX", "TROPONINI" in the last 168 hours. BNP (last 3 results) No results for input(s): "PROBNP" in the last 8760 hours. HbA1C: No results for input(s): "HGBA1C" in the last 72 hours. CBG: Recent Labs  Lab 09/04/22 0506 09/04/22 1150 09/04/22 1723 09/04/22 2347 09/05/22 0534  GLUCAP 127* 105* 133* 122* 111*   Lipid Profile: Recent Labs    09/05/22 0336  TRIG 62   Thyroid Function Tests: No results for input(s): "TSH", "T4TOTAL", "FREET4", "T3FREE", "THYROIDAB" in the last 72 hours. Anemia Panel: No results for input(s): "VITAMINB12", "FOLATE", "FERRITIN", "TIBC", "IRON", "RETICCTPCT" in the last 72 hours. Sepsis Labs: No results for  input(s): "PROCALCITON", "LATICACIDVEN" in the last 168 hours.  No results found for this or any previous visit (from the past 240 hour(s)).        Radiology Studies: No results found.      Scheduled Meds:  acetaminophen  1,000 mg Oral Q6H   atorvastatin  20 mg Oral Daily   Chlorhexidine Gluconate Cloth  6 each Topical Daily   cholecalciferol  1,000 Units Oral Daily   docusate sodium  100 mg Oral BID   enoxaparin (LOVENOX) injection  40 mg Subcutaneous Q24H   feeding supplement  237 mL Oral BID BM   lidocaine  1 patch Transdermal Q24H   magnesium hydroxide  15 mL Oral Daily   nicotine  21 mg Transdermal QHS   oxyCODONE  15 mg Oral Q12H   pantoprazole  40 mg Oral Daily   polyethylene glycol  17 g Oral BID   sodium chloride flush  3 mL Intravenous Q12H   sodium chloride flush  5 mL Intracatheter Q8H   tiZANidine  2 mg Oral Q8H   traZODone   150 mg Oral QHS   Continuous Infusions:  chlorproMAZINE (THORAZINE) 12.5 mg in sodium chloride 0.9 % 25 mL IVPB 12.5 mg (09/05/22 0503)   TPN ADULT (ION) 40 mL/hr at 09/04/22 1755     LOS: 20 days   Time spent= 35 mins    Jerry Rittenhouse Arsenio Loader, MD [facility]  If 7PM-7AM, please contact night-coverage  09/05/2022, 8:31 AM

## 2022-09-05 NOTE — Progress Notes (Signed)
Brief Nutrition Note  Pt reported to surgery team this AM that he was not able to tolerate the Ensure Plus as he can not tolerate supplements >250kcal. Met with pt in room to discuss his limited options for supplements. Will trial both glucerna and ensure max to determine if these are tolerated. Will also add carnation instant breakfast with whole milk. Pt agreeable to this and requests chocolate ice cream from nourishment room which was provided.  Ranell Patrick, RD, LDN Clinical Dietitian RD pager # available in [place]  After hours/weekend pager # available in [facility]

## 2022-09-05 NOTE — Progress Notes (Signed)
PHARMACY - TOTAL PARENTERAL NUTRITION CONSULT NOTE   Indication: Prolonged ileus  Patient Measurements: Height: 5' 5.5" (166.4 cm) Weight: 46.7 kg (103 lb) IBW/kg (Calculated) : 62.65 TPN AdjBW (KG): 52.6 Body mass index is 16.88 kg/m.  Usual Weight: 115 lbs  Assessment:  61 yo male with a history of chronic pain syndrome on Suboxone, chronic pancreatitis with prior surgery for pseudocyst removal in the 1990s, prior BII in the 1980s, COPD, rheumatoid arthritis on Enbrel, history of seizures not on an AED, and Zollinger-Ellison syndrome who presented with acute onset of abdominal pain along with N/V. He was found to have a WBC of 22K, lactic acid of 3.1 and a CT scan concerning for a colonic perforation. Pharmacy consulted to initiate TPN for prolonged ileus.  Glucose / Insulin: no hx DM, CBGs controlled   Electrolytes:  K 4.1, CoCa 9.8, Mg 1.7 Renal: Scr 0.58 stable (bsl < 1); BUN wnl Hepatic: LFTs / TG wnl. Tbili 0.4, Albumin 2.5 Intake / Output; MIVF: UOP 1.1 ml/kg/hr; 0 ml colostomy output GI Imaging: 9/15 colonic perforation in the region of the sigmoid colon likely secondary to impacted stool 9/29 CT abd/pelvis: no obstruction or drainable fluid GI Surgeries / Procedures:  9/15 Exploratory laparotomy, Left colostomy and partial sigmoid colectomy  9/23: drain placed for L retroperitoneal abscess (13m fluid aspirated)  Central access: 08/20/22 TPN start date:  08/20/22  Nutritional Goals: Goal TPN rate is 80 mL/hr (provides 100 g of protein and 1993 kcals per day)  RD Assessment: Estimated Needs Total Energy Estimated Needs: 1900-2100 kcal/d Total Protein Estimated Needs: 90-110g/d Total Fluid Estimated Needs: >/= 1.7 L   Current Nutrition:  10/4: 1/2 TPN Soft diet Glucerna BID - 1 charted today  Plan:  Continue half-rate TPN with rate of 471mhr at 1800 TPN will provide 50g protein and 997 kcal, meeting 50% of patient needs Electrolytes in TPN: Na 150 mEq/L, K 45  mEq/L, Ca 2 mEq/L. Mg 10 mEq/L, Phos 16 mmol/L. Cl:Ac 1:1 (all will be half due to half-rate) Change MVI to enteral Discontinue CBG checks. BG has been controlled Monitor standard TPN labs on Mon/Thurs and as needed F/u colostomy output and oral intake to determine when to stop TPN  Thank you for allowing pharmacy to be a part of this patient's care.  LoErskine SpeedPharmD Clinical Pharmacist 09/05/2022, 11:31 AM

## 2022-09-05 NOTE — Progress Notes (Signed)
Physical Therapy Treatment & Discharge  Patient Details Name: Jerry Knight MRN: [id number] DOB: 1961/03/07 Today's Date: 09/05/2022   History of Present Illness Pt is a 61 y/o male admitted secondary to bowel perforation. Pt is s/p emergent exploratory laparotomy, left colostomy and partial sigmoid colectomy on 9/15. PMH includes RA, COPD, and seizures.    PT Comments    Patient has met all PT goals and is functioning at modI level for mobility with use of straight cane. Able to perform standing exercises focused on LE strengthening with no LOB. Encouraged continued mobility with nursing staff, mobility specialists, and family/friends. No PT follow up recommended at this time. PT will sign off.     Recommendations for follow up therapy are one component of a multi-disciplinary discharge planning process, led by the attending physician.  Recommendations may be updated based on patient status, additional functional criteria and insurance authorization.  Follow Up Recommendations  No PT follow up     Assistance Recommended at Discharge PRN  Patient can return home with the following A little help with bathing/dressing/bathroom;Assistance with cooking/housework;Assist for transportation;Help with stairs or ramp for entrance   Equipment Recommendations  None recommended by PT    Recommendations for Other Services       Precautions / Restrictions Precautions Precautions: Fall Precaution Comments: abd precautions Restrictions Weight Bearing Restrictions: No     Mobility  Bed Mobility               General bed mobility comments: sitting on bench in hallway    Transfers Overall transfer level: Modified independent Equipment used: None                    Ambulation/Gait Ambulation/Gait assistance: Modified independent (Device/Increase time) Gait Distance (Feet): 50 Feet Assistive device: Straight cane Gait Pattern/deviations: WFL(Within Functional  Limits) Gait velocity: decreased     General Gait Details: utilizing SPC to ambulate back to room with no balance deficits noted   Stairs             Wheelchair Mobility    Modified Rankin (Stroke Patients Only)       Balance Overall balance assessment: Mild deficits observed, not formally tested                                          Cognition Arousal/Alertness: Awake/alert Behavior During Therapy: WFL for tasks assessed/performed Overall Cognitive Status: Within Functional Limits for tasks assessed                                          Exercises General Exercises - Lower Extremity Hip Flexion/Marching: Both, 10 reps, Standing Heel Raises: Both, 10 reps, Standing Mini-Sqauts: 10 reps Other Exercises Other Exercises: sit to stand x 10 with hands on knees    General Comments        Pertinent Vitals/Pain Pain Assessment Pain Assessment: Faces Faces Pain Scale: Hurts a little bit Pain Location: abdomen Pain Descriptors / Indicators: Discomfort Pain Intervention(s): Monitored during session    Home Living                          Prior Function            PT Goals (current  goals can now be found in the care plan section) Acute Rehab PT Goals Patient Stated Goal: to feel better and go home PT Goal Formulation: All assessment and education complete, DC therapy Progress towards PT goals: Goals met/education completed, patient discharged from PT    Frequency    Min 3X/week      PT Plan Current plan remains appropriate    Co-evaluation              AM-PAC PT "6 Clicks" Mobility   Outcome Measure  Help needed turning from your back to your side while in a flat bed without using bedrails?: None Help needed moving from lying on your back to sitting on the side of a flat bed without using bedrails?: None Help needed moving to and from a bed to a chair (including a wheelchair)?: None Help  needed standing up from a chair using your arms (e.g., wheelchair or bedside chair)?: None Help needed to walk in hospital room?: None Help needed climbing 3-5 steps with a railing? : None 6 Click Score: 24    End of Session   Activity Tolerance: Patient tolerated treatment well Patient left: in chair;with call bell/phone within reach;with family/visitor present Nurse Communication: Mobility status PT Visit Diagnosis: Other abnormalities of gait and mobility (R26.89)     Time: 3212-2482 PT Time Calculation (min) (ACUTE ONLY): 14 min  Charges:  $Therapeutic Exercise: 8-22 mins                     Naveh Rickles A. Gilford Rile PT, DPT Acute Rehabilitation Services Office [phone]    Linna Hoff 09/05/2022, 10:06 AM

## 2022-09-06 DIAGNOSIS — K631 Perforation of intestine (nontraumatic): Secondary | ICD-10-CM | POA: Diagnosis not present

## 2022-09-06 LAB — BASIC METABOLIC PANEL
Anion gap: 9 (ref 5–15)
BUN: 16 mg/dL (ref 6–20)
CO2: 27 mmol/L (ref 22–32)
Calcium: 9.1 mg/dL (ref 8.9–10.3)
Chloride: 99 mmol/L (ref 98–111)
Creatinine, Ser: 0.8 mg/dL (ref 0.61–1.24)
GFR, Estimated: >60 mL/min (ref >60)
Glucose, Bld: 106 mg/dL — ABNORMAL HIGH (ref 70–99)
Potassium: 4.2 mmol/L (ref 3.5–5.1)
Sodium: 135 mmol/L (ref 135–145)

## 2022-09-06 LAB — CBC
HCT: 28.9 % — ABNORMAL LOW (ref 39.0–52.0)
Hemoglobin: 9.6 g/dL — ABNORMAL LOW (ref 13.0–17.0)
MCH: 31.2 pg (ref 26.0–34.0)
MCHC: 33.2 g/dL (ref 30.0–36.0)
MCV: 93.8 fL (ref 80.0–100.0)
Platelets: 308 10*3/uL (ref 150–400)
RBC: 3.08 MIL/uL — ABNORMAL LOW (ref 4.22–5.81)
RDW: 14.4 % (ref 11.5–15.5)
WBC: 13.9 10*3/uL — ABNORMAL HIGH (ref 4.0–10.5)
nRBC: 0 % (ref 0.0–0.2)

## 2022-09-06 LAB — MAGNESIUM: Magnesium: 1.7 mg/dL (ref 1.7–2.4)

## 2022-09-06 MED ORDER — OXYCODONE HCL ER 10 MG PO T12A
20.0000 mg | EXTENDED_RELEASE_TABLET | Freq: Two times a day (BID) | ORAL | Status: DC
  Administered 2022-09-07 – 2022-09-11 (×10): 20 mg via ORAL
  Filled 2022-09-06 (×11): qty 2

## 2022-09-06 MED ORDER — MORPHINE SULFATE (PF) 2 MG/ML IV SOLN
2.0000 mg | Freq: Once | INTRAVENOUS | Status: AC
  Administered 2022-09-06: 2 mg via INTRAVENOUS

## 2022-09-06 MED ORDER — TIZANIDINE HCL 2 MG PO TABS
4.0000 mg | ORAL_TABLET | Freq: Three times a day (TID) | ORAL | Status: DC
  Administered 2022-09-07 – 2022-09-11 (×13): 4 mg via ORAL
  Filled 2022-09-06 (×14): qty 2

## 2022-09-06 MED ORDER — BUPRENORPHINE HCL-NALOXONE HCL 8-2 MG SL SUBL
1.0000 | SUBLINGUAL_TABLET | Freq: Every day | SUBLINGUAL | Status: DC
  Administered 2022-09-06 – 2022-09-11 (×6): 1 via SUBLINGUAL
  Filled 2022-09-06 (×6): qty 1

## 2022-09-06 MED ORDER — TRAVASOL 10 % IV SOLN
INTRAVENOUS | Status: AC
  Filled 2022-09-06: qty 499.2

## 2022-09-06 MED ORDER — SORBITOL 70 % SOLN
400.0000 mL | TOPICAL_OIL | Freq: Once | ORAL | Status: AC
  Administered 2022-09-06: 400 mL via RECTAL
  Filled 2022-09-06: qty 120

## 2022-09-06 MED ORDER — SORBITOL 70 % SOLN
400.0000 mL | TOPICAL_OIL | Freq: Once | ORAL | Status: DC
  Filled 2022-09-06: qty 120

## 2022-09-06 NOTE — Consult Note (Signed)
[place] Nurse ostomy follow up Stoma type/location: LLQ Colostomy  minimal hard stool output yesterday PM.  When I digitize stoma, I can feel firm stool in the distal segment I can touch.  After discussion to [facility], PA, I will administer SMOG enema to facilitate movement. Patient in agreement with procedure.  Stomal assessment/size: pink and moist.  I administer 500Ml of SMOG mixture.  200 ml (wait 5 minutes) 200 ML (wait 3 minutes) final 100 Ml.  Throughout this, patient had some stooling, soft and not formed.  I can still feel the firmness when I digitize the stoma. He reports he feels pressure. I remove the irrigation sleeve, replace his pouch and we will wait for output.  Will follow.   Domenic Moras MSN, RN, FNP-BC CWON Wound, Ostomy, Continence Nurse Pager [phone]

## 2022-09-06 NOTE — Progress Notes (Signed)
PRN morphine '4mg'$  given at 0616, 1017 PRN oxycodone '10mg'$  given at 1497,0263 PRN ativan 0.'5mg'$  given at 0818 PRN thorazine given at 1150 One time dose of morphine 2 mg given at 1153  Requesting that MD evaluates patient's pain control and adds more scheduled pain medications to reduce frequency of patient requests for pain and anxiety medicine

## 2022-09-06 NOTE — Progress Notes (Signed)
Patient passed two more small hard lumps of stool this evening.

## 2022-09-06 NOTE — Progress Notes (Signed)
21 Days Post-Op  Subjective: CC: Doing well.  Stable abdominal pain that is crampy and mainly on the left.  Controlled with medications.  Still using a good bit of IV pain medication.  Stable nausea, no vomiting.  Tolerating diet and finishing more than half of his trays  + 3 shakes in the last 24 hours.  Reports 2 hard stool balls out of ostomy yesterday after enema.  Mobilizing. Voiding.   Objective: Vital signs in last 24 hours: Temp:  [98.2 F (36.8 C)-98.9 F (37.2 C)] 98.2 F (36.8 C) (10/06 0807) Pulse Rate:  [96-107] 104 (10/06 0807) Resp:  [16-18] 18 (10/06 0807) BP: (100-115)/(55-74) 115/68 (10/06 0807) SpO2:  [93 %-96 %] 95 % (10/06 0807) Last BM Date : 09/05/22  Intake/Output from previous day: 10/05 0701 - 10/06 0700 In: 859.3 [P.O.:460; I.V.:369.3; IV Piggyback:25] Out: 505 [Urine:500; Drains:5] Intake/Output this shift: Total I/O In: 480 [P.O.:480] Out: -   PE: General: pleasant, WD, male who is laying in bed in NAD Lungs: Respiratory effort nonlabored Abd: Soft, mild distention. Some mild left sided ttp reported on exam without rigidity or guarding - otherwise NT, stable from yesterday. Colostomy with small amount of air and thin brown liquid stool in bag. Stoma pink and viable. Surgical JP drain site with dressing in place, cdi. IR drain bloody purulent output - 5cc/24 hours. Midline wound with stable separation as noted on prior pictures/exam. Most of the wound with layer of granulation tissue overlying and less fibrinous exudate compared to prior   Lab Results:  Recent Labs    09/03/22 1456 09/05/22 0336  WBC 14.6* 10.7*  HGB 9.8* 9.0*  HCT 29.8* 27.5*  PLT 441* 322   BMET Recent Labs    09/05/22 0336 09/06/22 0304  NA 135 135  K 4.1 4.2  CL 103 99  CO2 24 27  GLUCOSE 105* 106*  BUN 16 16  CREATININE 0.58* 0.80  CALCIUM 8.4* 9.1   PT/INR No results for input(s): "LABPROT", "INR" in the last 72 hours. CMP     Component Value Date/Time    NA 135 09/06/2022 0304   NA 140 10/12/2020 1526   K 4.2 09/06/2022 0304   CL 99 09/06/2022 0304   CO2 27 09/06/2022 0304   GLUCOSE 106 (H) 09/06/2022 0304   BUN 16 09/06/2022 0304   BUN 10 10/12/2020 1526   CREATININE 0.80 09/06/2022 0304   CREATININE 0.77 01/27/2013 1105   CALCIUM 9.1 09/06/2022 0304   PROT 6.6 09/05/2022 0336   PROT 7.5 10/12/2020 1526   ALBUMIN 2.5 (L) 09/05/2022 0336   ALBUMIN 4.5 10/12/2020 1526   AST 16 09/05/2022 0336   ALT 12 09/05/2022 0336   ALKPHOS 66 09/05/2022 0336   BILITOT 0.4 09/05/2022 0336   BILITOT 0.3 10/12/2020 1526   GFRNONAA >60 09/06/2022 0304   GFRAA 123 10/12/2020 1526   Lipase     Component Value Date/Time   LIPASE 39 08/16/2022 0506    Studies/Results: No results found.  Anti-infectives: Anti-infectives (From admission, onward)    Start     Dose/Rate Route Frequency Ordered Stop   08/27/22 1300  Ampicillin-Sulbactam (UNASYN) 3 g in sodium chloride 0.9 % 100 mL IVPB        3 g 200 mL/hr over 30 Minutes Intravenous Every 8 hours 08/27/22 1202 08/29/22 0447   08/17/22 1630  piperacillin-tazobactam (ZOSYN) IVPB 3.375 g  Status:  Discontinued        3.375 g 12.5 mL/hr  over 240 Minutes Intravenous Every 8 hours 08/17/22 1540 08/27/22 1202   08/16/22 1500  piperacillin-tazobactam (ZOSYN) IVPB 3.375 g        3.375 g 100 mL/hr over 30 Minutes Intravenous STAT 08/16/22 1446 08/16/22 2032   08/16/22 0615  piperacillin-tazobactam (ZOSYN) IVPB 3.375 g        3.375 g 100 mL/hr over 30 Minutes Intravenous  Once 08/16/22 0609 08/16/22 0713        Assessment/Plan POD 21, s/p ex lap partial sigmoid colectomy, colostomy by Dr. Oda Cogan on 9/15 for Perforated stercoral ulcer - History of chronic pain on suboxone. Off PCA. On scheduled oxycontin today '15mg'$  BID and 5-10 mg oxycodone for breakthrough.  This has help significantly. Weaning IV pain medication as able - Cont soft diet and shakes. Had some ostomy output after enema. Repeat  enema today. May be able to wean off tpn in am. - CT 9/22 w/ extraperitoneal fluid collection in LLQ lateral to psoas. S/p IR drain 9/23. Cx with E. Coli. Narrowed to Unasyn. Comp abx 5d after drainage (through 9/28). CT 9/29 with improvement (IR following). No new intra-abdominal abscess. IR drain output low, if remains low anticipate IR may re-eval for possible removal - they are following - Surgical JP drain out 9/28 - Continue Mepitel at base of midline with wtd dressings over bid  - WOC RN following for new ostomy - Encourage ambulation and IS use   FEN - Soft, 1/2 TPN (may d/c). PO bowel regimen, IVFs per TRH VTE - SCDs, Lovenox ID - zosyn 9/14 - 9/26. Unasyn 9/26 - 9/28. None currently. WBC down at 10.7 on last check  Foley - out 9/18. voiding   Per primary Tobacco abuse/COPD - 2 ppd H/O substance abuse H/O opioid dependence - now on suboxone prn H/O seizure d/o Chronic pancreatitis Zollinger-Ellison syndrome GERD Hypoalbuminemic - appears malnourished at baseline due to muscle wasting RA - mtx and enbrel held   LOS: 21 days    Jillyn Ledger , [facility] Surgery 09/06/2022, 10:46 AM Please see Amion for pager number during day hours 7:00am-4:30pm

## 2022-09-06 NOTE — Progress Notes (Addendum)
Attempted to give pt his scheduled and prn meds that are due at this time. Pt declined and states that he's not ready to take his medication right now. Will attempt to offer medication at a later time per pt request.

## 2022-09-06 NOTE — Progress Notes (Signed)
Mobility Specialist - Progress Note   09/06/22 0900  Mobility  Activity Ambulated independently in hallway  Activity Response Tolerated well  Distance Ambulated (ft) 550 ft  $Mobility charge 1 Mobility  Level of Assistance Modified independent, requires aide device or extra time  Assistive Device Cane    Pt ambulated independently in hallway. No physical assistance needed.   Paulla Dolly Mobility Specialist

## 2022-09-06 NOTE — Progress Notes (Signed)
Supervising Physician: Markus Daft  Patient Status:  [facility] - In-pt  Chief Complaint:  Intra-abdominal abscess  Subjective:  Patient awake and alert in bed.  Endorses passing 2 balls of stool last night.  No new complaints.    Allergies: Ambien [zolpidem tartrate], Iohexol, Lunesta [eszopiclone], Seroquel [quetiapine fumarate], and Sonata [zaleplon]  Medications: Prior to Admission medications   Medication Sig Start Date End Date Taking? Authorizing Provider  amitriptyline (ELAVIL) 50 MG tablet Take 100 mg by mouth at bedtime.   Yes [provider]  atorvastatin (LIPITOR) 20 MG tablet Take 1 tablet (20 mg total) by mouth daily. 02/19/21  Yes Just, Laurita Quint, FNP  buprenorphine-naloxone (SUBOXONE) 8-2 mg SUBL SL tablet Place 1 tablet under the tongue daily.   Yes [provider]  cyclobenzaprine (FLEXERIL) 10 MG tablet Take 1 tablet (10 mg total) by mouth 3 (three) times daily. 02/19/21  Yes Just, Laurita Quint, FNP  etanercept (ENBREL) 50 MG/ML injection Inject 50 mg into the skin once a week.   Yes [provider]  folic acid (FOLVITE) 1 MG tablet Take 1 mg by mouth daily. 08/06/22  Yes [provider]  linaclotide Rolan Lipa) 145 MCG CAPS capsule Take 1 capsule (145 mcg total) by mouth daily before breakfast. 02/19/21  Yes Just, Laurita Quint, FNP  LORazepam (ATIVAN) 1 MG tablet Take 1 mg by mouth 3 (three) times daily. 07/18/22  Yes [provider]  methotrexate (RHEUMATREX) 2.5 MG tablet Take 10 mg by mouth once a week. 06/08/22  Yes [provider]  mirtazapine (REMERON) 45 MG tablet Take 1 tablet (45 mg total) by mouth at bedtime. 02/19/21  Yes Just, Laurita Quint, FNP  omeprazole (PRILOSEC) 20 MG capsule TAKE 1 CAPSULE(20 MG) BY MOUTH DAILY 02/19/21  Yes Just, Laurita Quint, FNP  traZODone (DESYREL) 50 MG tablet Take 3-4 tablets (150-200 mg total) by mouth at bedtime. 02/19/21  Yes Just, Laurita Quint, FNP     Vital Signs: BP 115/68 (BP Location: Left Arm)    Pulse (!) 104   Temp 98.2 F (36.8 C) (Oral)   Resp 18   Ht 5' 5.5" (1.664 m)   Wt 103 lb (46.7 kg)   SpO2 95%   BMI 16.88 kg/m   Physical Exam Vitals reviewed.  Constitutional:      Appearance: He is ill-appearing.  HENT:     Head: Normocephalic and atraumatic.  Cardiovascular:     Rate and Rhythm: Tachycardia present.  Pulmonary:     Effort: Pulmonary effort is normal. No respiratory distress.  Abdominal:     Comments: Ostomy bag empty Midline wound dressed Dressing around abdominal drain. Drain with scant OP.  Skin:    General: Skin is warm and dry.     Coloration: Skin is pale.  Neurological:     General: No focal deficit present.     Mental Status: He is alert and oriented to person, place, and time.  Psychiatric:        Mood and Affect: Mood normal.        Behavior: Behavior normal.    Imaging: No results found.  Labs:  CBC: Recent Labs    08/30/22 0440 09/01/22 0429 09/03/22 1456 09/05/22 0336  WBC 15.3* 12.5* 14.6* 10.7*  HGB 10.2* 9.6* 9.8* 9.0*  HCT 30.6* 29.3* 29.8* 27.5*  PLT 598* 527* 441* 322    COAGS: Recent Labs    08/24/22 1415  INR 1.1    BMP: Recent Labs  09/02/22 0423 09/04/22 0347 09/05/22 0336 09/06/22 0304  NA 136 137 135 135  K 4.1 4.2 4.1 4.2  CL 104 102 103 99  CO2 '26 27 24 27  '$ GLUCOSE 123* 117* 105* 106*  BUN '14 17 16 16  '$ CALCIUM 8.7* 8.6* 8.4* 9.1  CREATININE 0.48* 0.53* 0.58* 0.80  GFRNONAA >60 >60 >60 >60    LIVER FUNCTION TESTS: Recent Labs    08/26/22 0319 08/29/22 0308 09/02/22 0423 09/05/22 0336  BILITOT 0.5 0.3 0.2* 0.4  AST '18 19 15 16  '$ ALT '13 14 14 12  '$ ALKPHOS 82 76 72 66  PROT 7.0 7.3 7.8 6.6  ALBUMIN 2.0* 2.2* 2.5* 2.5*    Assessment and Plan:  Abdominal abscess --drain functioning well but with low OP.  Would like to see if OP changes as bowels begin to move.  Drain Location: LLQ Size: Fr size: 10 Fr Date of placement: 08/24/22  Currently to: Drain collection device: suction  bulb 24 hour output:  Output by Drain (mL) 09/04/22 0700 - 09/04/22 1459 09/04/22 1500 - 09/04/22 2259 09/04/22 2300 - 09/05/22 0659 09/05/22 0700 - 09/05/22 1459 09/05/22 1500 - 09/05/22 2259 09/05/22 2300 - 09/06/22 0659 09/06/22 0700 - 09/06/22 1002  Closed System Drain 1 Lateral LLQ Bulb (JP) 0 15    5     Current examination: Flushes/aspirates easily.  Dressed appropriately.   Plan: Continue TID flushes with 5 cc NS. Record output Q shift. Dressing changes QD or PRN if soiled.  Call IR APP or on call IR MD if difficulty flushing or sudden change in drain output.  Repeat imaging/possible drain injection once output < 10 mL/QD (excluding flush material). Consideration for drain removal if output is < 10 mL/QD (excluding flush material), pending discussion with the providing surgical service.  Discharge planning: Please contact IR APP or on call IR MD prior to patient d/c to ensure appropriate follow up plans are in place. Typically patient will follow up with IR clinic 10-14 days post d/c for repeat imaging/possible drain injection. IR scheduler will contact patient with date/time of appointment. Patient will need to flush drain QD with 5 cc NS, record output QD, dressing changes every 2-3 days or earlier if soiled.   IR will continue to follow - please call with questions or concerns.     Electronically Signed: Pasty Spillers, PA 09/06/2022, 9:39 AM   I spent a total of 15 Minutes at the the patient's bedside AND on the patient's hospital floor or unit, greater than 50% of which was counseling/coordinating care for abdominal abscess drain.

## 2022-09-06 NOTE — Progress Notes (Signed)
PHARMACY - TOTAL PARENTERAL NUTRITION CONSULT NOTE   Indication: Prolonged ileus  Patient Measurements: Height: 5' 5.5" (166.4 cm) Weight: 46.7 kg (103 lb) IBW/kg (Calculated) : 62.65 TPN AdjBW (KG): 52.6 Body mass index is 16.88 kg/m.  Usual Weight: 115 lbs  Assessment:  61 yo male with a history of chronic pain syndrome on Suboxone, chronic pancreatitis with prior surgery for pseudocyst removal in the 1990s, prior BII in the 1980s, COPD, rheumatoid arthritis on Enbrel, history of seizures not on an AED, and Zollinger-Ellison syndrome who presented with acute onset of abdominal pain along with N/V. He was found to have a WBC of 22K, lactic acid of 3.1 and a CT scan concerning for a colonic perforation. Pharmacy consulted to initiate TPN for prolonged ileus.  Glucose / Insulin: no hx DM, CBGs controlled   Electrolytes:  K 4.2, Ca 9.1, Mg 1.7 Renal: Scr 0.8 stable (bsl < 1); BUN wnl Hepatic: LFTs / TG wnl. Tbili 0.4, Albumin 2.5 Intake / Output; MIVF: UOP 0.4 ml/kg/hr; 0 ml colostomy output, a few stool balls GI Imaging: 9/15 colonic perforation in the region of the sigmoid colon likely secondary to impacted stool 9/29 CT abd/pelvis: no obstruction or drainable fluid GI Surgeries / Procedures:  9/15 Exploratory laparotomy, Left colostomy and partial sigmoid colectomy  9/23: drain placed for L retroperitoneal abscess (133m fluid aspirated)  Central access: 08/20/22 TPN start date:  08/20/22  Nutritional Goals: Goal TPN rate is 80 mL/hr (provides 100 g of protein and 1993 kcals per day)  RD Assessment: Estimated Needs Total Energy Estimated Needs: 1900-2100 kcal/d Total Protein Estimated Needs: 90-110g/d Total Fluid Estimated Needs: >/= 1.7 L   Current Nutrition:  10/4: 1/2 TPN Soft diet Glucerna BID - 2 charted yesterday  Plan:  Continue half-rate TPN with rate of 476mhr at 1800 TPN will provide 50g protein and 997 kcal, meeting 50% of patient needs Electrolytes in TPN:  Na 150 mEq/L, K 45 mEq/L, Ca 2 mEq/L. Mg 10 mEq/L, Phos 16 mmol/L. Cl:Ac 1:1 (all will be half due to half-rate) Change MVI to enteral Discontinue CBG checks. BG has been controlled Monitor standard TPN labs on Mon/Thurs and as needed F/u colostomy output and oral intake to determine when to stop TPN  Thank you for allowing pharmacy to be a part of this patient's care.  LoErskine SpeedPharmD Clinical Pharmacist 09/06/2022, 11:23 AM

## 2022-09-06 NOTE — Progress Notes (Signed)
110- RN entered patient's room after assisting other patients. Patient very irritable. States "my call bell has been going off for an hour and I do not understand why it takes so long to get my nurse in the room." RN explained that nursing has 6 patients that all equally require pain medication, wound care, bathroom assistance, bed changes, etc. Patient verbally upset with RN and states that he had his enema done an hour ago and that he needs pain medication now. RN paged Legrand Como, PA for one time dose of morphine as morphine was not available for another two hours. RN requesting that [place] and nursing coordinate enemas/stoma care with when his next dose of morphine is available in the future.

## 2022-09-06 NOTE — Progress Notes (Signed)
PROGRESS NOTE    Jerry Knight  KCL:[id number] DOB: 1961-10-26 DOA: 08/16/2022 PCP: Beverley Fiedler, FNP   Brief Narrative:  61 y.o. male with medical history significant of  COPD, chronic pancreatitis, rheumatoid arthritis on Enbrel, history of seizures not on an AED, chronic pain on Suboxone, polysubstance abuse, Zollinger-Ellison syndrome, and constipation who presented with complaints of  lower abdominal pain.  Imaging remarkable for colonic perforation/obstruction  Patient underwent emergent exploratory laparotomy, left colostomy and partial sigmoid colostomy placement on 9/15 and thereafter also requiring left lower quadrant drain by IR on 9/23.  Patient did complete Zosyn followed by Unasyn from 9/14 - 9/28.  Currently getting TPN.  Surgery team working on increasing ostomy output by trying bowel regimen and slowly advancing diet. Encourage ambulation.    Assessment & Plan:  Principal Problem:   Bowel perforation (HCC) Active Problems:   Sepsis ([place])   Normocytic anemia   Hypokalemia   COPD (chronic obstructive pulmonary disease) (HCC)   Rheumatoid arthritis (HCC)   Anxiety   GERD (gastroesophageal reflux disease)   Chronic neck and back pain   Tobacco abuse   Protein-calorie malnutrition, severe   Sepsis secondary to bowel perforation (HCC) due to stercoral ulcer/stool impaction, left  retroperitoneal abscess -Upon admission patient met sepsis criteria.  Underwent emergent laparotomy, left colostomy and partial sigmoid colectomy on 9/15.  Also required left lower quadrant drain placement by IR on 9/23.  Completed course of Zosyn followed by Unasyn from 9/14 - 9/28.  -IR not recommending outpatient follow-up in 10-14 days.Patient will need to flush drain QD with 5 cc NS, record output QD, dressing changes every 2-3 days or earlier if soiled. Notify IR prior to dc so they can schedule f/u - Diet per general surgery - Pain control, bowel regimen.  Plans for repeat enema    Acute on chronic pain, fluctuating - On outpatient Suboxone.  Now off PCA.  Currently on OxyContin 15 mg twice daily, as needed oxycodone and Zanaflex. Bowel Regimen. Add Gabapentin.   Normocytic anemia -Likely anemia of chronic disease -HTN stable 9-10   Hypokalemia, hyponatremia -On TPN.  As needed repletion.  Hopefully we can slowly take him off TPN   COPD, without exacerbation - Chest x-ray with chronic interstitial lung changes with bibasilar atelectasis. -Stable, no wheezing, prn Nebs.    History of rheumatoid arthritis -Outpatient on Enbrel 50 mg and methotrexate 10 mg on Saturdays; on Hold.    Insomnia, anxiety -prn Ativan.  -Continue trazodone 150 mg at bedtime    GERD -Continue PPI   Tobacco abuse Patient reportedly smokes 2 packs of cigarettes per day on average. - cont Nicotine patch   7 mm right lower lobe pulmonary nodule. Non-contrast chest CT at 6-12 months is recommended. If the nodule is stable at time of repeat CT, then future CT at 18-24 months (from today's scan) is considered optional for low-risk patients, but is recommended for high-risk patients   Moderate to severe protein calorie malnutrition due to acute illness, hypoalbuminemia 1.9, muscle wasting Estimated body mass index is 16.88 kg/m as calculated from the following:   Height as of this encounter: 5' 5.5" (1.664 m).   Weight as of this encounter: 46.7 kg. -Currently on TPN     DVT prophylaxis: enoxaparin (LOVENOX) injection 40 mg Start: 08/25/22 1000 SCD's Start: 08/16/22 1754 SCDs Start: 08/16/22 1600 Code Status: Full Code Family Communication: Spouse at bedside  Status is: Inpatient, maintain hospital stay until cleared by general surgery  Nutritional status    Signs/Symptoms: severe muscle depletion, severe fat depletion  Interventions: TPN  Body mass index is 16.88 kg/m.       Subjective: States he had some hard stool that came out of his ostomy yesterday  evening.  Denies any nausea and vomiting.  Ambulating well without any issues.  Examination: Constitutional: Not in acute distress.  Cachectic frail Respiratory: Clear to auscultation bilaterally Cardiovascular: Normal sinus rhythm, no rubs Abdomen: Nontender nondistended good bowel sounds Musculoskeletal: No edema noted Skin: No rashes seen Neurologic: CN 2-12 grossly intact.  And nonfocal Psychiatric: Normal judgment and insight. Alert and oriented x 3. Normal mood.  RUE PICC 9/19 LUQ Colostomy LLQ Drain     Objective: Vitals:   09/05/22 2138 09/06/22 0452 09/06/22 0600 09/06/22 0807  BP: 113/69 (!) 100/55 104/70 115/68  Pulse: (!) 107 (!) 104 96 (!) 104  Resp: _0 Temp: 98.9 F (37.2 C) 98.4 F (36.9 C)  98.2 F (36.8 C)  TempSrc: Oral Oral  Oral  SpO2: 94% 93%  95%  Weight:      Height:        Intake/Output Summary (Last 24 hours) at 09/06/2022 1228 Last data filed at 09/06/2022 1100 Gross per 24 hour  Intake 1219.29 ml  Output 5 ml  Net 1214.29 ml   Filed Weights   08/31/22 0336 09/01/22 0533 09/02/22 0500  Weight: 49.4 kg 50.2 kg 46.7 kg     Data Reviewed:   CBC: Recent Labs  Lab 09/01/22 0429 09/03/22 1456 09/05/22 0336  WBC 12.5* 14.6* 10.7*  HGB 9.6* 9.8* 9.0*  HCT 29.3* 29.8* 27.5*  MCV 93.6 93.7 95.2  PLT 527* 441* 659   Basic Metabolic Panel: Recent Labs  Lab 09/02/22 0423 09/04/22 0347 09/05/22 0336 09/06/22 0304  NA 136 137 135 135  K 4.1 4.2 4.1 4.2  CL 104 102 103 99  CO2 _1 GLUCOSE 123* 117* 105* 106*  BUN _2 CREATININE 0.48* 0.53* 0.58* 0.80  CALCIUM 8.7* 8.6* 8.4* 9.1  MG 1.8 1.8 1.7 1.7  PHOS 3.8  --  4.0  --    GFR: Estimated Creatinine Clearance: 65.7 mL/min (by C-G formula based on SCr of 0.8 mg/dL). Liver Function Tests: Recent Labs  Lab 09/02/22 0423 09/05/22 0336  AST 15 16  ALT 14 12  ALKPHOS 72 66  BILITOT 0.2* 0.4  PROT 7.8 6.6  ALBUMIN 2.5* 2.5*   No results for  input(s): "LIPASE", "AMYLASE" in the last 168 hours. No results for input(s): "AMMONIA" in the last 168 hours. Coagulation Profile: No results for input(s): "INR", "PROTIME" in the last 168 hours. Cardiac Enzymes: No results for input(s): "CKTOTAL", "CKMB", "CKMBINDEX", "TROPONINI" in the last 168 hours. BNP (last 3 results) No results for input(s): "PROBNP" in the last 8760 hours. HbA1C: No results for input(s): "HGBA1C" in the last 72 hours. CBG: Recent Labs  Lab 09/04/22 0506 09/04/22 1150 09/04/22 1723 09/04/22 2347 09/05/22 0534  GLUCAP 127* 105* 133* 122* 111*   Lipid Profile: Recent Labs    09/05/22 0336  TRIG 62   Thyroid Function Tests: No results for input(s): "TSH", "T4TOTAL", "FREET4", "T3FREE", "THYROIDAB" in the last 72 hours. Anemia Panel: No results for input(s): "VITAMINB12", "FOLATE", "FERRITIN", "TIBC", "IRON", "RETICCTPCT" in the last 72 hours. Sepsis Labs: No results for input(s): "PROCALCITON", "LATICACIDVEN" in the last 168 hours.  No results found for this or any previous visit (from the  past 240 hour(s)).        Radiology Studies: No results found.      Scheduled Meds:  atorvastatin  20 mg Oral Daily   Chlorhexidine Gluconate Cloth  6 each Topical Daily   cholecalciferol  1,000 Units Oral Daily   docusate sodium  100 mg Oral BID   enoxaparin (LOVENOX) injection  40 mg Subcutaneous Q24H   feeding supplement (GLUCERNA SHAKE)  237 mL Oral BID BM   gabapentin  300 mg Oral BID   magnesium hydroxide  15 mL Oral Daily   multivitamin with minerals  1 tablet Oral Daily   nicotine  21 mg Transdermal QHS   oxyCODONE  15 mg Oral Q12H   pantoprazole  40 mg Oral Daily   polyethylene glycol  17 g Oral BID   Ensure Max Protein  11 oz Oral Daily   sodium chloride flush  3 mL Intravenous Q12H   sodium chloride flush  5 mL Intracatheter Q8H   sorbitol, milk of mag, mineral oil, glycerin (SMOG) enema  400 mL Rectal Once   tiZANidine  2 mg Oral Q8H    traZODone  150 mg Oral QHS   Continuous Infusions:  chlorproMAZINE (THORAZINE) 12.5 mg in sodium chloride 0.9 % 25 mL IVPB 12.5 mg (09/06/22 1150)   TPN ADULT (ION) 40 mL/hr at 09/05/22 1749   TPN ADULT (ION)       LOS: 21 days   Time spent= 35 mins    Jerry Hovater Arsenio Loader, MD [facility]  If 7PM-7AM, please contact night-coverage  09/06/2022, 12:28 PM

## 2022-09-07 DIAGNOSIS — Z79899 Other long term (current) drug therapy: Secondary | ICD-10-CM

## 2022-09-07 DIAGNOSIS — K633 Ulcer of intestine: Secondary | ICD-10-CM | POA: Diagnosis not present

## 2022-09-07 DIAGNOSIS — R64 Cachexia: Secondary | ICD-10-CM

## 2022-09-07 DIAGNOSIS — K5909 Other constipation: Secondary | ICD-10-CM

## 2022-09-07 DIAGNOSIS — D84821 Immunodeficiency due to drugs: Secondary | ICD-10-CM

## 2022-09-07 LAB — BASIC METABOLIC PANEL
Anion gap: 9 (ref 5–15)
BUN: 18 mg/dL (ref 6–20)
CO2: 27 mmol/L (ref 22–32)
Calcium: 9 mg/dL (ref 8.9–10.3)
Chloride: 100 mmol/L (ref 98–111)
Creatinine, Ser: 0.61 mg/dL (ref 0.61–1.24)
GFR, Estimated: >60 mL/min (ref >60)
Glucose, Bld: 113 mg/dL — ABNORMAL HIGH (ref 70–99)
Potassium: 4 mmol/L (ref 3.5–5.1)
Sodium: 136 mmol/L (ref 135–145)

## 2022-09-07 LAB — MAGNESIUM: Magnesium: 1.7 mg/dL (ref 1.7–2.4)

## 2022-09-07 MED ORDER — AMITRIPTYLINE HCL 50 MG PO TABS
100.0000 mg | ORAL_TABLET | Freq: Every day | ORAL | Status: DC
  Administered 2022-09-07 – 2022-09-10 (×4): 100 mg via ORAL
  Filled 2022-09-07 (×4): qty 2

## 2022-09-07 MED ORDER — ACETAMINOPHEN 500 MG PO TABS
1000.0000 mg | ORAL_TABLET | Freq: Three times a day (TID) | ORAL | Status: DC
  Administered 2022-09-07: 1000 mg via ORAL
  Administered 2022-09-07 (×2): 500 mg via ORAL
  Administered 2022-09-08 – 2022-09-11 (×6): 1000 mg via ORAL
  Filled 2022-09-07 (×11): qty 2

## 2022-09-07 MED ORDER — LINACLOTIDE 145 MCG PO CAPS
145.0000 ug | ORAL_CAPSULE | Freq: Every day | ORAL | Status: DC
  Administered 2022-09-08 – 2022-09-11 (×4): 145 ug via ORAL
  Filled 2022-09-07 (×4): qty 1

## 2022-09-07 MED ORDER — POLYETHYLENE GLYCOL 3350 17 G PO PACK
34.0000 g | PACK | Freq: Two times a day (BID) | ORAL | Status: DC
  Administered 2022-09-08 – 2022-09-10 (×5): 34 g via ORAL
  Filled 2022-09-07 (×5): qty 2

## 2022-09-07 MED ORDER — MAGNESIUM HYDROXIDE 400 MG/5ML PO SUSP: 30.0000 mL | Freq: Two times a day (BID) | ORAL | Status: DC | PRN

## 2022-09-07 MED ORDER — FOLIC ACID 1 MG PO TABS
1.0000 mg | ORAL_TABLET | Freq: Every day | ORAL | Status: DC
  Administered 2022-09-07 – 2022-09-11 (×5): 1 mg via ORAL
  Filled 2022-09-07 (×5): qty 1

## 2022-09-07 NOTE — Progress Notes (Signed)
Pt declined wound care at this time and asked that we wait to change it when his next dose of morphine is due.

## 2022-09-07 NOTE — Progress Notes (Signed)
Nutrition Brief Note  RD received a consult to start a calorie count. Pt currently on TPN and PO diet.   This RD called RN to hang up envelope and keep track of calorie count over the weekend. RN informed RD that envelope was already hanging. Nursing to document percent consumed for each item on the patient's meal tray ticket and keep in envelope. Also document percent of any supplement or snack pt consumes and keep documentation in envelope for RD to review.    RD to follow-up with results on Monday for calorie count.    Hermina Barters RD, LDN Clinical Dietitian See Shea Evans for contact information.

## 2022-09-07 NOTE — Progress Notes (Signed)
PROGRESS NOTE    Jerry Knight  IPJ:[id number] DOB: 15-Aug-1961 DOA: 08/16/2022 PCP: Beverley Fiedler, FNP   Brief Narrative:  61 y.o. male with medical history significant of  COPD, chronic pancreatitis, rheumatoid arthritis on Enbrel, history of seizures not on an AED, chronic pain on Suboxone, polysubstance abuse, Zollinger-Ellison syndrome, and constipation who presented with complaints of  lower abdominal pain.  Imaging remarkable for colonic perforation/obstruction  Patient underwent emergent exploratory laparotomy, left colostomy and partial sigmoid colostomy placement on 9/15 and thereafter also requiring left lower quadrant drain by IR on 9/23.  Patient did complete Zosyn followed by Unasyn from 9/14 - 9/28.  Currently getting TPN.  Surgery team working on increasing ostomy output by trying bowel regimen and slowly advancing diet. Encourage ambulation.    Assessment & Plan:  Principal Problem:   Bowel perforation (HCC) Active Problems:   Sepsis ([place])   Normocytic anemia   Hypokalemia   COPD (chronic obstructive pulmonary disease) (HCC)   Rheumatoid arthritis (HCC)   Anxiety   GERD (gastroesophageal reflux disease)   Chronic neck and back pain   Tobacco abuse   Protein-calorie malnutrition, severe   Sepsis secondary to bowel perforation (HCC) due to stercoral ulcer/stool impaction, left  retroperitoneal abscess -Upon admission patient met sepsis criteria.  Underwent emergent laparotomy, left colostomy and partial sigmoid colectomy on 9/15.  Also required left lower quadrant drain placement by IR on 9/23.  Completed course of Zosyn followed by Unasyn from 9/14 - 9/28.  -IR not recommending outpatient follow-up in 10-14 days.Patient will need to flush drain QD with 5 cc NS, record output QD, dressing changes every 2-3 days or earlier if soiled. Notify IR prior to dc so they can schedule f/u - Diet, dressing change in bowel regimen per general surgery   Acute on chronic  pain, fluctuating - Difficult to control, does not like to take Tylenol and gabapentin.  Resumed his Suboxone, will increase as necessary.  OxyContin 20 mg twice daily, oxycodone and Zanaflex.  Bowel regimen.  Normocytic anemia -Likely anemia of chronic disease -HTN stable 9-10   Hypokalemia, hyponatremia -On TPN.  As needed repletion.  Hopefully we can slowly take him off TPN   COPD, without exacerbation - Chest x-ray with chronic interstitial lung changes with bibasilar atelectasis. -Stable, no wheezing, prn Nebs.    History of rheumatoid arthritis -Outpatient on Enbrel 50 mg and methotrexate 10 mg on Saturdays; on Hold.    Insomnia, anxiety -prn Ativan.  -Continue trazodone 150 mg at bedtime    GERD -Continue PPI   Tobacco abuse Patient reportedly smokes 2 packs of cigarettes per day on average. - cont Nicotine patch   7 mm right lower lobe pulmonary nodule. Non-contrast chest CT at 6-12 months is recommended. If the nodule is stable at time of repeat CT, then future CT at 18-24 months (from today's scan) is considered optional for low-risk patients, but is recommended for high-risk patients   Moderate to severe protein calorie malnutrition due to acute illness, hypoalbuminemia 1.9, muscle wasting Estimated body mass index is 16.88 kg/m as calculated from the following:   Height as of this encounter: 5' 5.5" (1.664 m).   Weight as of this encounter: 46.7 kg. -Currently on TPN     DVT prophylaxis: enoxaparin (LOVENOX) injection 40 mg Start: 08/25/22 1000 SCD's Start: 08/16/22 1754 SCDs Start: 08/16/22 1600 Code Status: Full Code Family Communication:   Status is: Inpatient, maintain hospital stay until cleared by general surgery  Nutritional  status    Signs/Symptoms: severe muscle depletion, severe fat depletion  Interventions: TPN  Body mass index is 16.88 kg/m.       Subjective: More stool output from his ostomy bag.  Pain is better controlled  today.  Examination: Constitutional: Not in acute distress.  Cachectic frail with bilateral temporal wasting Respiratory: Clear to auscultation bilaterally Cardiovascular: Normal sinus rhythm, no rubs Abdomen: Nontender nondistended good bowel sounds Musculoskeletal: No edema noted Skin: No rashes seen Neurologic: CN 2-12 grossly intact.  And nonfocal Psychiatric: Normal judgment and insight. Alert and oriented x 3. Normal mood.  RUE PICC 9/19 LUQ Colostomy LLQ Drain     Objective: Vitals:   09/06/22 1704 09/06/22 2053 09/07/22 0515 09/07/22 0809  BP: 107/72 105/69 (!) 102/58 110/76  Pulse: (!) 103 (!) 106 (!) 105 94  Resp: 17 16 17 18   Temp: 98.9 F (37.2 C) 98.2 F (36.8 C) 97.9 F (36.6 C) 98.4 F (36.9 C)  TempSrc: Oral Oral Oral Oral  SpO2: 93% 96% 98% 96%  Weight:      Height:        Intake/Output Summary (Last 24 hours) at 09/07/2022 0825 Last data filed at 09/07/2022 0515 Gross per 24 hour  Intake 250 ml  Output 600 ml  Net -350 ml   Filed Weights   08/31/22 0336 09/01/22 0533 09/02/22 0500  Weight: 49.4 kg 50.2 kg 46.7 kg     Data Reviewed:   CBC: Recent Labs  Lab 09/01/22 0429 09/03/22 1456 09/05/22 0336 09/06/22 1403  WBC 12.5* 14.6* 10.7* 13.9*  HGB 9.6* 9.8* 9.0* 9.6*  HCT 29.3* 29.8* 27.5* 28.9*  MCV 93.6 93.7 95.2 93.8  PLT 527* 441* 322 343   Basic Metabolic Panel: Recent Labs  Lab 09/02/22 0423 09/04/22 0347 09/05/22 0336 09/06/22 0304 09/07/22 0306  NA 136 137 135 135 136  K 4.1 4.2 4.1 4.2 4.0  CL 104 102 103 99 100  CO2 26 27 24 27 27   GLUCOSE 123* 117* 105* 106* 113*  BUN 14 17 16 16 18   CREATININE 0.48* 0.53* 0.58* 0.80 0.61  CALCIUM 8.7* 8.6* 8.4* 9.1 9.0  MG 1.8 1.8 1.7 1.7 1.7  PHOS 3.8  --  4.0  --   --    GFR: Estimated Creatinine Clearance: 65.7 mL/min (by C-G formula based on SCr of 0.61 mg/dL). Liver Function Tests: Recent Labs  Lab 09/02/22 0423 09/05/22 0336  AST 15 16  ALT 14 12  ALKPHOS 72 66   BILITOT 0.2* 0.4  PROT 7.8 6.6  ALBUMIN 2.5* 2.5*   No results for input(s): "LIPASE", "AMYLASE" in the last 168 hours. No results for input(s): "AMMONIA" in the last 168 hours. Coagulation Profile: No results for input(s): "INR", "PROTIME" in the last 168 hours. Cardiac Enzymes: No results for input(s): "CKTOTAL", "CKMB", "CKMBINDEX", "TROPONINI" in the last 168 hours. BNP (last 3 results) No results for input(s): "PROBNP" in the last 8760 hours. HbA1C: No results for input(s): "HGBA1C" in the last 72 hours. CBG: Recent Labs  Lab 09/04/22 0506 09/04/22 1150 09/04/22 1723 09/04/22 2347 09/05/22 0534  GLUCAP 127* 105* 133* 122* 111*   Lipid Profile: Recent Labs    09/05/22 0336  TRIG 62   Thyroid Function Tests: No results for input(s): "TSH", "T4TOTAL", "FREET4", "T3FREE", "THYROIDAB" in the last 72 hours. Anemia Panel: No results for input(s): "VITAMINB12", "FOLATE", "FERRITIN", "TIBC", "IRON", "RETICCTPCT" in the last 72 hours. Sepsis Labs: No results for input(s): "PROCALCITON", "LATICACIDVEN" in the last  168 hours.  No results found for this or any previous visit (from the past 240 hour(s)).        Radiology Studies: No results found.      Scheduled Meds:  atorvastatin  20 mg Oral Daily   buprenorphine-naloxone  1 tablet Sublingual Daily   Chlorhexidine Gluconate Cloth  6 each Topical Daily   cholecalciferol  1,000 Units Oral Daily   docusate sodium  100 mg Oral BID   enoxaparin (LOVENOX) injection  40 mg Subcutaneous Q24H   feeding supplement (GLUCERNA SHAKE)  237 mL Oral BID BM   magnesium hydroxide  15 mL Oral Daily   multivitamin with minerals  1 tablet Oral Daily   nicotine  21 mg Transdermal QHS   oxyCODONE  20 mg Oral Q12H   pantoprazole  40 mg Oral Daily   polyethylene glycol  17 g Oral BID   Ensure Max Protein  11 oz Oral Daily   sodium chloride flush  3 mL Intravenous Q12H   sodium chloride flush  5 mL Intracatheter Q8H   tiZANidine   4 mg Oral Q8H   traZODone  150 mg Oral QHS   Continuous Infusions:  chlorproMAZINE (THORAZINE) 12.5 mg in sodium chloride 0.9 % 25 mL IVPB 12.5 mg (09/07/22 0533)   TPN ADULT (ION) 40 mL/hr at 09/06/22 1741     LOS: 22 days   Time spent= 35 mins    Zac Torti Arsenio Loader, MD [facility]  If 7PM-7AM, please contact night-coverage  09/07/2022, 8:25 AM

## 2022-09-07 NOTE — Progress Notes (Signed)
Jerry Knight [id number] June 23, 1961  CARE TEAM:  PCP: Beverley Fiedler, FNP  Outpatient Care Team: Patient Care Team: Beverley Fiedler, FNP as PCP - General (Endocrinology)  Inpatient Treatment Team: Treatment Team: Attending Provider: Damita Lack, MD; Consulting Physician: [place] Pace, Md, MD; [place] Nurse: McNichol, Rudi Heap, RN; Rounding Team: Dorthy Cooler Radiology, MD; Mobility Specialist: Domenick Gong Rounding Team: Kelvin Cellar, MD; Pharmacist: Jerilynn Birkenhead, [facility]; Registered Nurse: Dagoberto Ligas, RN; Technician: Margretta Sidle, NT; Case Manager: Norina Buzzard, RN; Social Worker: Emeterio Reeve, LCSW; Utilization Review: Claudie Leach, RN; Mobility Specialist: Derenda Mis   Problem List:   Principal Problem:   Bowel perforation (HCC) Active Problems:   Hypokalemia   Chronic neck and back pain   Sepsis (HCC)   Normocytic anemia   COPD (chronic obstructive pulmonary disease) (HCC)   Rheumatoid arthritis (HCC)   Anxiety   GERD (gastroesophageal reflux disease)   Tobacco abuse   Protein-calorie malnutrition, severe   22 Days Post-Op  08/16/2022  POST-OPERATIVE DIAGNOSIS:  stercoral ulcer   PROCEDURE:   EXPLORATORY LAPAROTOMY (N/A) HARTMANN SIGMOID COLECTOMY / COLOSTOMY   SURGEON:  Surgeon(s) and Role:    Ralene Ok, MD - Primary  Findings: Patient had a large stercoral ulcer into the mesentery.  There was also free perforation with feculent peritonitis.  Several calcified stool balls were palpated in the transverse colon.  The sigmoid colon was resected which contained perforation.  This was taken down to the sacral promontory.  The staple line was tagged using 0 Prolene's x2.  Assessment  SLOWLY RECOVERING  [facility] Stay = 22 days)  Assessment/Plan s/p ex lap partial sigmoid colectomy, colostomy by Dr. Rosendo Gros on 9/15 for Perforated stercoral ulcer - History of chronic pain on suboxone.  Off  PCA.  On scheduled oxycontin '15mg'$  BID and 5-10 mg oxycodone for breakthrough.  This has help significantly.  Weaning IV pain medication as able - Heart soft diet and shakes.  -Cal counts - Wean off TPN - Surgical JP drain out 9/28 - CT 9/22 w/ extraperitoneal fluid collection in LLQ lateral to psoas.  S/p IR drain 9/23.  Cx with E. Coli. Narrowed to Unasyn.  Comp abx 5d after drainage (through 9/28).  CT 9/29 with improvement (IR following). No new intra-abdominal abscess.   Consider removing IR drain soon.  Consider drain study first to make sure there is no fistula to the rectal stump.  - Continue Mepitel at base of midline with wtd dressings over bid  - WOC RN following for new ostomy - Encourage ambulation and IS use   FEN  Adv Heart diet. Wean off TPN Severe chronic constipation resulting in stercoral perforation requiring emergency Hartmann resection in the setting of high narcotic use and malnutrition and rheumatoid arthritis chronically and suppressed.   Simplify bowel regimen Inc MiraLAX DC other meds except for as needed.   Add Linzess back on.      IVFs per [facility] VTE - SCDs, Lovenox ID - zosyn 9/14 - 9/26. Unasyn 9/26 - 9/28. None currently. WBC down at 10.7 on last check  Foley - out 9/18. voiding   Per primary Tobacco abuse/COPD - 2 ppd H/O substance abuse H/O opioid dependence - now on suboxone prn H/O seizure d/o Chronic pancreatitis Zollinger-Ellison syndrome GERD Hypoalbuminemic - appears malnourished at baseline due to muscle wasting RA - mtx and enbrel held    I reviewed nursing notes, hospitalist notes, last 24 h vitals and pain scores,  last 48 h intake and output, last 24 h labs and trends, and last 24 h imaging results. I have reviewed this patient's available data, including medical history, events of note, test results, etc as part of my evaluation.  A significant portion of that time was spent in counseling.  Care during the described time interval  was provided by me.  This care required high  level of medical decision making.  09/07/2022    Subjective: (Chief complaint)  Patient with some resolved after enema.  Tolerating p.o. better.  No major complaints of pain today.  Objective:  Vital signs:  Vitals:   09/06/22 1704 09/06/22 2053 09/07/22 0515 09/07/22 0809  BP: 107/72 105/69 (!) 102/58 110/76  Pulse: (!) 103 (!) 106 (!) 105 94  Resp: '17 16 17 18  '$ Temp: 98.9 F (37.2 C) 98.2 F (36.8 C) 97.9 F (36.6 C) 98.4 F (36.9 C)  TempSrc: Oral Oral Oral Oral  SpO2: 93% 96% 98% 96%  Weight:      Height:        Last BM Date : 09/07/22  Intake/Output   Yesterday:  10/06 0701 - 10/07 0700 In: 18 [P.O.:600; I.V.:10] Out: 600 [Urine:400; Stool:200] This shift:  No intake/output data recorded.  Bowel function:  Flatus: YES  BM:  YES  Drain: Feculent stained tubing.  Drainage seems more seropurulent   Physical Exam:  General: Pt awake/alert in no acute distress.  Thin and cachectic but not toxic Eyes: PERRL, normal EOM.  Sclera clear.  No icterus Neuro: CN II-XII intact w/o focal sensory/motor deficits. Lymph: No head/neck/groin lymphadenopathy Psych:  No delerium/psychosis/paranoia.  Oriented x 4 HENT: Normocephalic, Mucus membranes moist.  No thrush Neck: Supple, No tracheal deviation.  No obvious thyromegaly Chest: No pain to chest wall compression.  Good respiratory excursion.  No audible wheezing CV:  Pulses intact.  Regular rhythm.  No major extremity edema MS: Normal AROM mjr joints.  No obvious deformity  Abdomen: Somewhat firm.  Nondistended.  Mildly tender at incisions only.  No evidence of peritonitis.  No incarcerated hernias.  Dressing clean.  Colostomy with hard stool in bag and some liquid stool around it.  Ext:   No deformity.  No mjr edema.  No cyanosis Skin: No petechiae / purpurea.  No major sores.  Warm and dry    Results:   Cultures: Recent Results (from the past 720 hour(s))   Culture, blood (routine x 2)     Status: None   Collection Time: 08/16/22  6:55 AM   Specimen: BLOOD  Result Value Ref Range Status   Specimen Description   Final    BLOOD RIGHT ANTECUBITAL Performed at [facility], [address] [place], [place] [address]    Special Requests   Final    Blood Culture results may not be optimal due to an inadequate volume of blood received in culture bottles [place] Performed at [facility], [address] [place], [place] [address]    Culture   Final    NO GROWTH 5 DAYS Performed at [facility], [place] [address], [place],  [address]    Report Status 08/21/2022 FINAL  Final  Culture, blood (routine x 2)     Status: None   Collection Time: 08/16/22  7:08 AM   Specimen: BLOOD RIGHT HAND  Result Value Ref Range Status   Specimen Description   Final    BLOOD RIGHT HAND  Performed at [facility], [address] [place], [place] [address]    Special Requests   Final    BOTTLES DRAWN AEROBIC AND ANAEROBIC Blood Culture adequate volume Performed at [facility], [address] [place], [place] [address]    Culture   Final    NO GROWTH 5 DAYS Performed at [facility], [place] [address], [place], [place] [address]    Report Status 08/21/2022 FINAL  Final  Aerobic/Anaerobic Culture w Gram Stain (surgical/deep wound)     Status: None   Collection Time: 08/24/22  3:50 PM   Specimen: Abscess  Result Value Ref Range Status   Specimen Description ABSCESS  Final   Special Requests Normal  Final   Gram Stain   Final    ABUNDANT WBC PRESENT, PREDOMINANTLY PMN RARE GRAM NEGATIVE RODS RARE GRAM POSITIVE RODS    Culture   Final    ABUNDANT ESCHERICHIA COLI FEW BACTEROIDES THETAIOTAOMICRON BETA LACTAMASE POSITIVE Performed at [facility], [place] [address] [place], [place] [address]    Report Status 08/28/2022 FINAL  Final   Organism ID, Bacteria  ESCHERICHIA COLI  Final      Susceptibility   Escherichia coli - MIC*    AMPICILLIN <=2 SENSITIVE Sensitive     CEFAZOLIN <=4 SENSITIVE Sensitive     CEFEPIME <=0.12 SENSITIVE Sensitive     CEFTAZIDIME <=1 SENSITIVE Sensitive     CEFTRIAXONE <=0.25 SENSITIVE Sensitive     CIPROFLOXACIN <=0.25 SENSITIVE Sensitive     GENTAMICIN <=1 SENSITIVE Sensitive     IMIPENEM <=0.25 SENSITIVE Sensitive     TRIMETH/SULFA <=20 SENSITIVE Sensitive     AMPICILLIN/SULBACTAM <=2 SENSITIVE Sensitive     PIP/TAZO <=4 SENSITIVE Sensitive     * ABUNDANT ESCHERICHIA COLI    Labs: Results for orders placed or performed during the hospital encounter of 08/16/22 (from the past 48 hour(s))  Basic metabolic panel     Status: Abnormal   Collection Time: 09/06/22  3:04 AM  Result Value Ref Range   Sodium 135 135 - 145 mmol/L   Potassium 4.2 3.5 - 5.1 mmol/L   Chloride 99 98 - 111 mmol/L   CO2 27 22 - 32 mmol/L   Glucose, Bld 106 (H) 70 - 99 mg/dL    Comment: Glucose reference range applies only to samples taken after fasting for at least 8 hours.   BUN 16 6 - 20 mg/dL   Creatinine, Ser 0.80 0.61 - 1.24 mg/dL   Calcium 9.1 8.9 - 10.3 mg/dL   GFR, Estimated >60 >60 mL/min    Comment: (NOTE) Calculated using the CKD-EPI Creatinine Equation (2021)    Anion gap 9 5 - 15    Comment: Performed at [facility] [address], [place], [place] [address]  Magnesium     Status: None   Collection Time: 09/06/22  3:04 AM  Result Value Ref Range   Magnesium 1.7 1.7 - 2.4 mg/dL    Comment: Performed at [facility] [address], [place], [place] [address]  CBC     Status: Abnormal   Collection Time: 09/06/22  2:03 PM  Result Value Ref Range   WBC 13.9 (H) 4.0 - 10.5 K/uL   RBC 3.08 (L) 4.22 - 5.81 MIL/uL   Hemoglobin 9.6 (L) 13.0 - 17.0 g/dL   HCT 28.9 (L) 39.0 - 52.0 %   MCV 93.8 80.0 - 100.0 fL   MCH  31.2 26.0 - 34.0 pg   MCHC 33.2 30.0 - 36.0 g/dL   RDW 14.4 11.5 - 15.5 %   Platelets 308  150 - 400 K/uL   nRBC 0.0 0.0 - 0.2 %    Comment: Performed at [facility], [place] [address], [place], [place] [address]  Basic metabolic panel     Status: Abnormal   Collection Time: 09/07/22  3:06 AM  Result Value Ref Range   Sodium 136 135 - 145 mmol/L   Potassium 4.0 3.5 - 5.1 mmol/L   Chloride 100 98 - 111 mmol/L   CO2 27 22 - 32 mmol/L   Glucose, Bld 113 (H) 70 - 99 mg/dL    Comment: Glucose reference range applies only to samples taken after fasting for at least 8 hours.   BUN 18 6 - 20 mg/dL   Creatinine, Ser 0.61 0.61 - 1.24 mg/dL   Calcium 9.0 8.9 - 10.3 mg/dL   GFR, Estimated >60 >60 mL/min    Comment: (NOTE) Calculated using the CKD-EPI Creatinine Equation (2021)    Anion gap 9 5 - 15    Comment: Performed at [facility] [address], [place], [place] [address]  Magnesium     Status: None   Collection Time: 09/07/22  3:06 AM  Result Value Ref Range   Magnesium 1.7 1.7 - 2.4 mg/dL    Comment: Performed at [facility] [address] [place], [place] [address]    Imaging / Studies: No results found.  Medications / Allergies: per chart  Antibiotics: Anti-infectives (From admission, onward)    Start     Dose/Rate Route Frequency Ordered Stop   08/27/22 1300  Ampicillin-Sulbactam (UNASYN) 3 g in sodium chloride 0.9 % 100 mL IVPB        3 g 200 mL/hr over 30 Minutes Intravenous Every 8 hours 08/27/22 1202 08/29/22 0447   08/17/22 1630  piperacillin-tazobactam (ZOSYN) IVPB 3.375 g  Status:  Discontinued        3.375 g 12.5 mL/hr over 240 Minutes Intravenous Every 8 hours 08/17/22 1540 08/27/22 1202   08/16/22 1500  piperacillin-tazobactam (ZOSYN) IVPB 3.375 g        3.375 g 100 mL/hr over 30 Minutes Intravenous STAT 08/16/22 1446 08/16/22 2032   08/16/22 0615  piperacillin-tazobactam (ZOSYN) IVPB 3.375 g        3.375 g 100 mL/hr over 30 Minutes Intravenous  Once 08/16/22 0609 08/16/22 6269         Note: Portions of this report may  have been transcribed using voice recognition software. Every effort was made to ensure accuracy; however, inadvertent computerized transcription errors may be present.   Any transcriptional errors that result from this process are unintentional.    Adin Hector, MD, FACS, MASCRS Esophageal, Gastrointestinal & Colorectal Surgery Robotic and Minimally Invasive Surgery  [facility] [place]. [address], [place], [place] [address] [phone] Fax [phone] Main  CONTACT INFORMATION:  Weekday (9AM-5PM): Call [facility] main office at [phone]  Weeknight (5PM-9AM) or Weekend/Holiday: Check [email or web address] (password " [email or web address]") for General Surgery [facility] coverage  (Please, do not use SecureChat as it is not reliable communication to reach operating surgeons for immediate patient care given surgeries/outpatient duties/clinic/cross-coverage/off post-call which would lead to a delay in care.  Epic staff messaging available for outptient concerns, but may not be answered for 48 hours or more).     09/07/2022  10:02 AM

## 2022-09-07 NOTE — Progress Notes (Signed)
Mobility Specialist Progress Note   09/07/22 1221  Mobility  Activity Refused mobility   Pt claimed they over did it w/ physical activity yesterday and would prefer to rest today even after max encouragement. Will check back in in the afternoon if time permits.   Holland Falling Mobility Specialist MS [facility] #:  [phone] Acute Rehab Office:  [phone]

## 2022-09-08 DIAGNOSIS — K631 Perforation of intestine (nontraumatic): Secondary | ICD-10-CM | POA: Diagnosis not present

## 2022-09-08 LAB — BASIC METABOLIC PANEL
Anion gap: 7 (ref 5–15)
BUN: 15 mg/dL (ref 6–20)
CO2: 27 mmol/L (ref 22–32)
Calcium: 8.9 mg/dL (ref 8.9–10.3)
Chloride: 101 mmol/L (ref 98–111)
Creatinine, Ser: 0.6 mg/dL — ABNORMAL LOW (ref 0.61–1.24)
GFR, Estimated: >60 mL/min (ref >60)
Glucose, Bld: 104 mg/dL — ABNORMAL HIGH (ref 70–99)
Potassium: 4.1 mmol/L (ref 3.5–5.1)
Sodium: 135 mmol/L (ref 135–145)

## 2022-09-08 LAB — IRON AND TIBC
Iron: 71 ug/dL (ref 45–182)
Saturation Ratios: 22 % (ref 17.9–39.5)
TIBC: 319 ug/dL (ref 250–450)
UIBC: 248 ug/dL

## 2022-09-08 LAB — MAGNESIUM: Magnesium: 1.6 mg/dL — ABNORMAL LOW (ref 1.7–2.4)

## 2022-09-08 LAB — VITAMIN B12: Vitamin B-12: 633 pg/mL (ref 180–914)

## 2022-09-08 LAB — HEMOGLOBIN A1C
Hgb A1c MFr Bld: 5.7 % — ABNORMAL HIGH (ref 4.8–5.6)
Mean Plasma Glucose: 116.89 mg/dL

## 2022-09-08 LAB — FERRITIN: Ferritin: 67 ng/mL (ref 24–336)

## 2022-09-08 NOTE — Progress Notes (Signed)
PROGRESS NOTE    Jerry Knight  HXT:[id number] DOB: 1961/06/19 DOA: 08/16/2022 PCP: Beverley Fiedler, FNP   Brief Narrative:  61 y.o. male with medical history significant of  COPD, chronic pancreatitis, rheumatoid arthritis on Enbrel, history of seizures not on an AED, chronic pain on Suboxone, polysubstance abuse, Zollinger-Ellison syndrome, and constipation who presented with complaints of  lower abdominal pain.  Imaging remarkable for colonic perforation/obstruction  Patient underwent emergent exploratory laparotomy, left colostomy and partial sigmoid colostomy placement on 9/15 and thereafter also requiring left lower quadrant drain by IR on 9/23.  Patient did complete Zosyn followed by Unasyn from 9/14 - 9/28.  Currently getting TPN.  Surgery team working on increasing ostomy output by trying bowel regimen and slowly advancing diet. Encourage ambulation.  His ostomy output has improved, general surgery considering fistula study tomorrow.   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 Active Problems:   Constipation, chronic   Immunosuppression due to drug therapy ([place])   Bowel perforation (HCC)   Sepsis (HCC)   Normocytic anemia   Hypokalemia   COPD (chronic obstructive pulmonary disease) (HCC)   Rheumatoid arthritis (HCC)   Anxiety   GERD (gastroesophageal reflux disease)   Chronic neck and back pain   Tobacco abuse   Opioid dependence on maintenance agonist therapy, no symptoms (HCC)   Psychophysiological insomnia   Protein-calorie malnutrition, severe   Cachexia ([place])   Sepsis secondary to bowel perforation (HCC) due to stercoral ulcer/stool impaction, left  retroperitoneal abscess -Upon admission patient met sepsis criteria.  Underwent emergent laparotomy, left colostomy and partial sigmoid colectomy on 9/15.  Also required left lower quadrant drain placement by IR on 9/23.  Completed course of Zosyn  followed by Unasyn from 9/14 - 9/28.  -IR not recommending outpatient follow-up in 10-14 days.Patient will need to flush drain QD with 5 cc NS, record output QD, dressing changes every 2-3 days or earlier if soiled. Notify IR prior to dc so they can schedule f/u - Diet, dressing change in bowel regimen per general surgery.  General surgery considering fistula study tomorrow.  Hopefully we can remove the drain   Acute on chronic pain, improved Does not like taking Tylenol/gabapentin but current regimen seems to be working well for him.  Resumed his Suboxone, will increase as necessary.  OxyContin 20 mg twice daily, oxycodone and Zanaflex.  Bowel regimen.  Normocytic anemia -Likely anemia of chronic disease -HTN stable 9-10   Hypokalemia, hyponatremia -On TPN.  As needed repletion.    COPD, without exacerbation - Chest x-ray with chronic interstitial lung changes with bibasilar atelectasis. -Stable, no wheezing, prn Nebs.    History of rheumatoid arthritis -Outpatient on Enbrel 50 mg and methotrexate 10 mg on Saturdays; on Hold.    Insomnia, anxiety -prn Ativan.  -Continue trazodone 150 mg at bedtime    GERD -Continue PPI   Tobacco abuse Patient reportedly smokes 2 packs of cigarettes per day on average. - cont Nicotine patch   7 mm right lower lobe pulmonary nodule. Non-contrast chest CT at 6-12 months is recommended. If the nodule is stable at time of repeat CT, then future CT at 18-24 months (from today's scan) is considered optional for low-risk patients, but is recommended for high-risk patients   Moderate to severe protein calorie malnutrition due to acute illness, hypoalbuminemia 1.9, muscle wasting Estimated body mass index is 16.88 kg/m as calculated from the following:   Height as of this encounter:  5' 5.5" (1.664 m).   Weight as of this encounter: 46.7 kg. TPN has been weaned off now.     DVT prophylaxis: enoxaparin (LOVENOX) injection 40 mg Start: 08/25/22  1000 SCD's Start: 08/16/22 1754 SCDs Start: 08/16/22 1600 Code Status: Full Code Family Communication:   Status is: Hopefully discharge in next 24-48 hours depending on how he does over next 24 hours and his drain study  Nutritional status    Signs/Symptoms: severe muscle depletion, severe fat depletion  Interventions: TPN  Body mass index is 17.16 kg/m.       Subjective: Seen and examined.  He is ambulating in the hallway with his wife.  Feels much better today.  He is asking if we can consider repeating another CT scan prior to his discharge which I explained I will defer this to his surgeons.  Examination: Constitutional: Not in acute distress.  Cachectic frail with bilateral temporal wasting Respiratory: Clear to auscultation bilaterally Cardiovascular: Normal sinus rhythm, no rubs Abdomen: Nontender nondistended good bowel sounds Musculoskeletal: No edema noted Skin: No rashes seen Neurologic: CN 2-12 grossly intact.  And nonfocal Psychiatric: Normal judgment and insight. Alert and oriented x 3. Normal mood.  RUE PICC 9/19 LUQ Colostomy LLQ Drain     Objective: Vitals:   09/07/22 2108 09/08/22 0500 09/08/22 0535 09/08/22 0816  BP: (!) 101/52  105/69 102/68  Pulse: 79  82 87  Resp: 17  18 16   Temp: 98 F (36.7 C)  98.2 F (36.8 C) 98.8 F (37.1 C)  TempSrc: Oral  Oral Oral  SpO2: 96%  98% 95%  Weight:  47.5 kg    Height:        Intake/Output Summary (Last 24 hours) at 09/08/2022 1036 Last data filed at 09/08/2022 0820 Gross per 24 hour  Intake 250 ml  Output 2775 ml  Net -2525 ml   Filed Weights   09/01/22 0533 09/02/22 0500 09/08/22 0500  Weight: 50.2 kg 46.7 kg 47.5 kg     Data Reviewed:   CBC: Recent Labs  Lab 09/03/22 1456 09/05/22 0336 09/06/22 1403  WBC 14.6* 10.7* 13.9*  HGB 9.8* 9.0* 9.6*  HCT 29.8* 27.5* 28.9*  MCV 93.7 95.2 93.8  PLT 441* 322 244   Basic Metabolic Panel: Recent Labs  Lab 09/02/22 0423 09/04/22 0347  09/05/22 0336 09/06/22 0304 09/07/22 0306 09/08/22 0316  NA 136 137 135 135 136 135  K 4.1 4.2 4.1 4.2 4.0 4.1  CL 104 102 103 99 100 101  CO2 26 27 24 27 27 27   GLUCOSE 123* 117* 105* 106* 113* 104*  BUN 14 17 16 16 18 15   CREATININE 0.48* 0.53* 0.58* 0.80 0.61 0.60*  CALCIUM 8.7* 8.6* 8.4* 9.1 9.0 8.9  MG 1.8 1.8 1.7 1.7 1.7 1.6*  PHOS 3.8  --  4.0  --   --   --    GFR: Estimated Creatinine Clearance: 66.8 mL/min (A) (by C-G formula based on SCr of 0.6 mg/dL (L)). Liver Function Tests: Recent Labs  Lab 09/02/22 0423 09/05/22 0336  AST 15 16  ALT 14 12  ALKPHOS 72 66  BILITOT 0.2* 0.4  PROT 7.8 6.6  ALBUMIN 2.5* 2.5*   No results for input(s): "LIPASE", "AMYLASE" in the last 168 hours. No results for input(s): "AMMONIA" in the last 168 hours. Coagulation Profile: No results for input(s): "INR", "PROTIME" in the last 168 hours. Cardiac Enzymes: No results for input(s): "CKTOTAL", "CKMB", "CKMBINDEX", "TROPONINI" in the last 168 hours. BNP (  last 3 results) No results for input(s): "PROBNP" in the last 8760 hours. HbA1C: Recent Labs    09/08/22 0316  HGBA1C 5.7*   CBG: Recent Labs  Lab 09/04/22 0506 09/04/22 1150 09/04/22 1723 09/04/22 2347 09/05/22 0534  GLUCAP 127* 105* 133* 122* 111*   Lipid Profile: No results for input(s): "CHOL", "HDL", "LDLCALC", "TRIG", "CHOLHDL", "LDLDIRECT" in the last 72 hours.  Thyroid Function Tests: No results for input(s): "TSH", "T4TOTAL", "FREET4", "T3FREE", "THYROIDAB" in the last 72 hours. Anemia Panel: Recent Labs    09/08/22 0316  VITAMINB12 633  FERRITIN 67  TIBC 319  IRON 71   Sepsis Labs: No results for input(s): "PROCALCITON", "LATICACIDVEN" in the last 168 hours.  No results found for this or any previous visit (from the past 240 hour(s)).        Radiology Studies: No results found.      Scheduled Meds:  acetaminophen  1,000 mg Oral TID   amitriptyline  100 mg Oral QHS   atorvastatin  20  mg Oral Daily   buprenorphine-naloxone  1 tablet Sublingual Daily   Chlorhexidine Gluconate Cloth  6 each Topical Daily   cholecalciferol  1,000 Units Oral Daily   enoxaparin (LOVENOX) injection  40 mg Subcutaneous Q24H   feeding supplement (GLUCERNA SHAKE)  237 mL Oral BID BM   folic acid  1 mg Oral Daily   linaclotide  145 mcg Oral QAC breakfast   multivitamin with minerals  1 tablet Oral Daily   nicotine  21 mg Transdermal QHS   oxyCODONE  20 mg Oral Q12H   pantoprazole  40 mg Oral Daily   polyethylene glycol  34 g Oral BID   Ensure Max Protein  11 oz Oral Daily   sodium chloride flush  3 mL Intravenous Q12H   sodium chloride flush  5 mL Intracatheter Q8H   tiZANidine  4 mg Oral Q8H   traZODone  150 mg Oral QHS   Continuous Infusions:  chlorproMAZINE (THORAZINE) 12.5 mg in sodium chloride 0.9 % 25 mL IVPB 12.5 mg (09/07/22 2030)     LOS: 23 days   Time spent= 35 mins    Mikkel Charrette Arsenio Loader, MD [facility]  If 7PM-7AM, please contact night-coverage  09/08/2022, 10:36 AM

## 2022-09-08 NOTE — Progress Notes (Signed)
Jerry Knight [id number] 1961/10/01  CARE TEAM:  PCP: Beverley Fiedler, FNP  Outpatient Care Team: Patient Care Team: Beverley Fiedler, FNP as PCP - General (Endocrinology)  Inpatient Treatment Team: Treatment Team: Attending Provider: Damita Lack, MD; Consulting Physician: Edison Pace, Md, MD; [place] Nurse: Lissa Morales Rudi Heap, RN; Rounding Team: Dorthy Cooler Radiology, MD; Mobility Specialist: Domenick Gong; Rounding Team: Kelvin Cellar, MD; Mobility Specialist: Derenda Mis; Pharmacist: Jerilynn Birkenhead, [facility]; Utilization Review: Antionette Char, RN; Registered Nurse: Mellody Life, RN; Social Worker: Vern Claude, [place]; Physician Assistant: Pasty Spillers, PA   Problem List:   Principal Problem:   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 Active Problems:   Opioid dependence on maintenance agonist therapy, no symptoms (HCC)   Constipation, chronic   Immunosuppression due to drug therapy ([place])   Hypokalemia   Chronic neck and back pain   Psychophysiological insomnia   Bowel perforation (HCC)   Sepsis ([place])   Normocytic anemia   COPD (chronic obstructive pulmonary disease) (HCC)   Rheumatoid arthritis ([place])   Anxiety   GERD (gastroesophageal reflux disease)   Tobacco abuse   Protein-calorie malnutrition, severe   Cachexia ([place])   23 Days Post-Op  08/16/2022  POST-OPERATIVE DIAGNOSIS:  stercoral ulcer   PROCEDURE:   EXPLORATORY LAPAROTOMY (N/A) HARTMANN SIGMOID COLECTOMY / COLOSTOMY   SURGEON:  Surgeon(s) and Role:    Ralene Ok, MD - Primary  Findings: Patient had a large stercoral ulcer into the mesentery.  There was also free perforation with feculent peritonitis.  Several calcified stool balls were palpated in the transverse colon.  The sigmoid colon was resected which contained perforation.  This was taken down to the sacral promontory.  The staple line was tagged using 0 Prolene's  x2.  Assessment  SLOWLY RECOVERING  [facility] Stay = 23 days)  Assessment/Plan s/p ex lap partial sigmoid colectomy, colostomy by Dr. Rosendo Gros on 9/15 for Perforated stercoral ulcer - History of chronic pain on suboxone.  Off PCA.  On scheduled oxycontin '15mg'$  BID and 5-10 mg oxycodone for breakthrough.  Seems to be working better.  Weaning IV pain medication as able - Heart solid diet and shakes.  -Cal counts - Weaned off TPN 10/7 - Surgical JP drain out 9/28 - CT 9/22 w/ extraperitoneal fluid collection in LLQ lateral to psoas.  S/p IR drain 9/23.  Cx with E. Coli. Narrowed to Unasyn.  Comp abx 5d after drainage (through 9/28).  CT 9/29 with improvement (IR following). No new intra-abdominal abscess.   Consider removing IR drain soon.  I ordered drain study Monday by IR to make sure there is no fistula to the rectal stump.  - Continue Mepitel at base of midline with wtd dressings - ? Consider wound vac? - WOC RN following for new ostomy - Encourage ambulation and IS use   FEN  Adv Heart diet. Weaned off TPN Severe chronic constipation resulting in stercoral perforation requiring emergency Hartmann resection in the setting of high narcotic use and malnutrition and rheumatoid arthritis chronically and suppressed.   Simplify bowel regimen MiraLAX 34g BID DC other meds except for as needed.   Added Linzess back on 10/7   IVFs per [facility] VTE - SCDs, Lovenox ID - zosyn 9/14 - 9/26. Unasyn 9/26 - 9/28. None currently. WBC down at 10.7 on last check  Foley - out 9/18. voiding   Per primary Tobacco abuse/COPD - 2 ppd H/O substance abuse H/O opioid dependence - now  on suboxone prn H/O seizure d/o Chronic pancreatitis Zollinger-Ellison syndrome GERD Hypoalbuminemic - appears malnourished at baseline due to muscle wasting RA - mtx and enbrel held  Disposition per primary service.  Discussed with Dr. Reesa Chew.  He feels patient is close to going home as well.  Possibly soon as  Monday if drain study can be done.  I reviewed nursing notes, hospitalist notes, last 24 h vitals and pain scores, last 48 h intake and output, last 24 h labs and trends, and last 24 h imaging results. I have reviewed this patient's available data, including medical history, events of note, test results, etc as part of my evaluation.  A significant portion of that time was spent in counseling.  Care during the described time interval was provided by me.  This care required high  level of medical decision making.  09/08/2022    Subjective: (Chief complaint)  Patient feeling better overall.  Wife in room.  Moving bowels better.  Walking in hallways with cane.  Objective:  Vital signs:  Vitals:   09/07/22 2108 09/08/22 0500 09/08/22 0535 09/08/22 0816  BP: (!) 101/52  105/69 102/68  Pulse: 79  82 87  Resp: '17  18 16  '$ Temp: 98 F (36.7 C)  98.2 F (36.8 C) 98.8 F (37.1 C)  TempSrc: Oral  Oral Oral  SpO2: 96%  98% 95%  Weight:  47.5 kg    Height:        Last BM Date : 09/07/22  Intake/Output   Yesterday:  10/07 0701 - 10/08 0700 In: 250 [P.O.:240] Out: 2275 [Urine:400; Drains:25; HYWVP:7106] This shift:  Total I/O In: -  Out: 500 [Urine:500]  Bowel function:  Flatus: YES  BM:  YES  Drain: Feculent stained tubing.  Drainage seems more serous   Physical Exam:  General: Pt awake/alert in no acute distress.  Thin and cachectic but not toxic Eyes: PERRL, normal EOM.  Sclera clear.  No icterus Neuro: CN II-XII intact w/o focal sensory/motor deficits. Lymph: No head/neck/groin lymphadenopathy Psych:  No delerium/psychosis/paranoia.  Oriented x 4 HENT: Normocephalic, Mucus membranes moist.  No thrush Neck: Supple, No tracheal deviation.  No obvious thyromegaly Chest: No pain to chest wall compression.  Good respiratory excursion.  No audible wheezing CV:  Pulses intact.  Regular rhythm.  No major extremity edema MS: Normal AROM mjr joints.  No obvious  deformity  Abdomen: Soft.  Nondistended.  Mildly tender at incisions only.  Midline wound deep with clean granulation tissue.  No evidence of peritonitis.  No incarcerated hernias.  Dressing clean.  Colostomy with flatus & stool in bag.  Ext:   No deformity.  No mjr edema.  No cyanosis Skin: No petechiae / purpurea.  No major sores.  Warm and dry    Results:   Cultures: Recent Results (from the past 720 hour(s))  Culture, blood (routine x 2)     Status: None   Collection Time: 08/16/22  6:55 AM   Specimen: BLOOD  Result Value Ref Range Status   Specimen Description   Final    BLOOD RIGHT ANTECUBITAL Performed at [facility], [address] [place], [place] [address]    Special Requests   Final    Blood Culture results may not be optimal due to an inadequate volume of blood received in culture bottles [place] Performed at [facility], [address], [place], [place] [address]    Culture  Final    NO GROWTH 5 DAYS Performed at [facility], [place] [address], [place], [place] [address]    Report Status 08/21/2022 FINAL  Final  Culture, blood (routine x 2)     Status: None   Collection Time: 08/16/22  7:08 AM   Specimen: BLOOD RIGHT HAND  Result Value Ref Range Status   Specimen Description   Final    BLOOD RIGHT HAND Performed at [facility], [address] [place], [place] [address]    Special Requests   Final    BOTTLES DRAWN AEROBIC AND ANAEROBIC Blood Culture adequate volume Performed at [facility], [address] [place], [place] [address]    Culture   Final    NO GROWTH 5 DAYS Performed at [facility], [place] [address] [place], [place] [address]    Report Status 08/21/2022 FINAL  Final  Aerobic/Anaerobic Culture w Gram Stain (surgical/deep wound)     Status: None   Collection Time: 08/24/22  3:50 PM   Specimen: Abscess  Result Value Ref Range Status   Specimen Description  ABSCESS  Final   Special Requests Normal  Final   Gram Stain   Final    ABUNDANT WBC PRESENT, PREDOMINANTLY PMN RARE GRAM NEGATIVE RODS RARE GRAM POSITIVE RODS    Culture   Final    ABUNDANT ESCHERICHIA COLI FEW BACTEROIDES THETAIOTAOMICRON BETA LACTAMASE POSITIVE Performed at [facility], [place] [address], [place], [place] [address]    Report Status 08/28/2022 FINAL  Final   Organism ID, Bacteria ESCHERICHIA COLI  Final      Susceptibility   Escherichia coli - MIC*    AMPICILLIN <=2 SENSITIVE Sensitive     CEFAZOLIN <=4 SENSITIVE Sensitive     CEFEPIME <=0.12 SENSITIVE Sensitive     CEFTAZIDIME <=1 SENSITIVE Sensitive     CEFTRIAXONE <=0.25 SENSITIVE Sensitive     CIPROFLOXACIN <=0.25 SENSITIVE Sensitive     GENTAMICIN <=1 SENSITIVE Sensitive     IMIPENEM <=0.25 SENSITIVE Sensitive     TRIMETH/SULFA <=20 SENSITIVE Sensitive     AMPICILLIN/SULBACTAM <=2 SENSITIVE Sensitive     PIP/TAZO <=4 SENSITIVE Sensitive     * ABUNDANT ESCHERICHIA COLI    Labs: Results for orders placed or performed during the hospital encounter of 08/16/22 (from the past 48 hour(s))  CBC     Status: Abnormal   Collection Time: 09/06/22  2:03 PM  Result Value Ref Range   WBC 13.9 (H) 4.0 - 10.5 K/uL   RBC 3.08 (L) 4.22 - 5.81 MIL/uL   Hemoglobin 9.6 (L) 13.0 - 17.0 g/dL   HCT 28.9 (L) 39.0 - 52.0 %   MCV 93.8 80.0 - 100.0 fL   MCH 31.2 26.0 - 34.0 pg   MCHC 33.2 30.0 - 36.0 g/dL   RDW 14.4 11.5 - 15.5 %   Platelets 308 150 - 400 K/uL   nRBC 0.0 0.0 - 0.2 %    Comment: Performed at [facility], [address] [place], [place] [address]  Basic metabolic panel     Status: Abnormal   Collection Time: 09/07/22  3:06 AM  Result Value Ref Range   Sodium 136 135 - 145 mmol/L   Potassium 4.0 3.5 - 5.1 mmol/L   Chloride 100 98 - 111 mmol/L   CO2 27 22 - 32 mmol/L   Glucose, Bld 113 (H) 70 - 99 mg/dL    Comment: Glucose reference  range applies only to samples taken after fasting for at  least 8 hours.   BUN 18 6 - 20 mg/dL   Creatinine, Ser 0.61 0.61 - 1.24 mg/dL   Calcium 9.0 8.9 - 10.3 mg/dL   GFR, Estimated >60 >60 mL/min    Comment: (NOTE) Calculated using the CKD-EPI Creatinine Equation (2021)    Anion gap 9 5 - 15    Comment: Performed at [facility] [address], [place], [place] [address]  Magnesium     Status: None   Collection Time: 09/07/22  3:06 AM  Result Value Ref Range   Magnesium 1.7 1.7 - 2.4 mg/dL    Comment: Performed at [facility] [address] [place], [place] [address]  Basic metabolic panel     Status: Abnormal   Collection Time: 09/08/22  3:16 AM  Result Value Ref Range   Sodium 135 135 - 145 mmol/L   Potassium 4.1 3.5 - 5.1 mmol/L   Chloride 101 98 - 111 mmol/L   CO2 27 22 - 32 mmol/L   Glucose, Bld 104 (H) 70 - 99 mg/dL    Comment: Glucose reference range applies only to samples taken after fasting for at least 8 hours.   BUN 15 6 - 20 mg/dL   Creatinine, Ser 0.60 (L) 0.61 - 1.24 mg/dL   Calcium 8.9 8.9 - 10.3 mg/dL   GFR, Estimated >60 >60 mL/min    Comment: (NOTE) Calculated using the CKD-EPI Creatinine Equation (2021)    Anion gap 7 5 - 15    Comment: Performed at [facility] [address] [place], [place] [address]  Magnesium     Status: Abnormal   Collection Time: 09/08/22  3:16 AM  Result Value Ref Range   Magnesium 1.6 (L) 1.7 - 2.4 mg/dL    Comment: Performed at [facility] [address] [place], [place] [address]  Hemoglobin A1c     Status: Abnormal   Collection Time: 09/08/22  3:16 AM  Result Value Ref Range   Hgb A1c MFr Bld 5.7 (H) 4.8 - 5.6 %    Comment: (NOTE) Pre diabetes:          5.7%-6.4%  Diabetes:              >6.4%  Glycemic control for   <7.0% adults with diabetes    Mean Plasma Glucose 116.89 mg/dL    Comment: Performed at [facility] [address] [place], [place] [address]  Vitamin B12     Status: None   Collection Time: 09/08/22  3:16 AM   Result Value Ref Range   Vitamin B-12 633 180 - 914 pg/mL    Comment: (NOTE) This assay is not validated for testing neonatal or myeloproliferative syndrome specimens for Vitamin B12 levels. Performed at [facility], [place] [address] [place], [place] [address]   Iron and TIBC     Status: None   Collection Time: 09/08/22  3:16 AM  Result Value Ref Range   Iron 71 45 - 182 ug/dL   TIBC 319 250 - 450 ug/dL   Saturation Ratios 22 17.9 - 39.5 %   UIBC 248 ug/dL    Comment: Performed at [facility], [place] [address] [place], [place] [address]  Ferritin     Status: None   Collection Time: 09/08/22  3:16 AM  Result Value Ref Range   Ferritin 67 24 - 336 ng/mL  Comment: Performed at [facility], [place] [address], [place], [place] [address]    Imaging / Studies: No results found.  Medications / Allergies: per chart  Antibiotics: Anti-infectives (From admission, onward)    Start     Dose/Rate Route Frequency Ordered Stop   08/27/22 1300  Ampicillin-Sulbactam (UNASYN) 3 g in sodium chloride 0.9 % 100 mL IVPB        3 g 200 mL/hr over 30 Minutes Intravenous Every 8 hours 08/27/22 1202 08/29/22 0447   08/17/22 1630  piperacillin-tazobactam (ZOSYN) IVPB 3.375 g  Status:  Discontinued        3.375 g 12.5 mL/hr over 240 Minutes Intravenous Every 8 hours 08/17/22 1540 08/27/22 1202   08/16/22 1500  piperacillin-tazobactam (ZOSYN) IVPB 3.375 g        3.375 g 100 mL/hr over 30 Minutes Intravenous STAT 08/16/22 1446 08/16/22 2032   08/16/22 0615  piperacillin-tazobactam (ZOSYN) IVPB 3.375 g        3.375 g 100 mL/hr over 30 Minutes Intravenous  Once 08/16/22 0609 08/16/22 7262         Note: Portions of this report may have been transcribed using voice recognition software. Every effort was made to ensure accuracy; however, inadvertent computerized transcription errors may be present.   Any transcriptional errors that result from this process are  unintentional.    Adin Hector, MD, FACS, MASCRS Esophageal, Gastrointestinal & Colorectal Surgery Robotic and Minimally Invasive Surgery  [facility]. [address] [place], [place] [address] [phone] Fax [phone] Main  CONTACT INFORMATION:  Weekday (9AM-5PM): Call [facility] main office at [phone]  Weeknight (5PM-9AM) or Weekend/Holiday: Check [email or web address] (password " [email or web address]") for General Surgery [facility] coverage  (Please, do not use SecureChat as it is not reliable communication to reach operating surgeons for immediate patient care given surgeries/outpatient duties/clinic/cross-coverage/off post-call which would lead to a delay in care.  Epic staff messaging available for outptient concerns, but may not be answered for 48 hours or more).     09/08/2022  9:58 AM

## 2022-09-09 ENCOUNTER — Inpatient Hospital Stay (HOSPITAL_COMMUNITY): Payer: Medicare HMO

## 2022-09-09 ENCOUNTER — Ambulatory Visit: Payer: Self-pay | Admitting: Surgery

## 2022-09-09 DIAGNOSIS — K633 Ulcer of intestine: Secondary | ICD-10-CM | POA: Diagnosis not present

## 2022-09-09 HISTORY — PX: IR SINUS/FIST TUBE CHK-NON GI: IMG673

## 2022-09-09 LAB — BASIC METABOLIC PANEL
Anion gap: 4 — ABNORMAL LOW (ref 5–15)
BUN: 14 mg/dL (ref 6–20)
CO2: 27 mmol/L (ref 22–32)
Calcium: 8.5 mg/dL — ABNORMAL LOW (ref 8.9–10.3)
Chloride: 104 mmol/L (ref 98–111)
Creatinine, Ser: 0.67 mg/dL (ref 0.61–1.24)
GFR, Estimated: >60 mL/min (ref >60)
Glucose, Bld: 92 mg/dL (ref 70–99)
Potassium: 3.8 mmol/L (ref 3.5–5.1)
Sodium: 135 mmol/L (ref 135–145)

## 2022-09-09 LAB — CBC
HCT: 26.7 % — ABNORMAL LOW (ref 39.0–52.0)
Hemoglobin: 8.8 g/dL — ABNORMAL LOW (ref 13.0–17.0)
MCH: 30.9 pg (ref 26.0–34.0)
MCHC: 33 g/dL (ref 30.0–36.0)
MCV: 93.7 fL (ref 80.0–100.0)
Platelets: 265 10*3/uL (ref 150–400)
RBC: 2.85 MIL/uL — ABNORMAL LOW (ref 4.22–5.81)
RDW: 14.1 % (ref 11.5–15.5)
WBC: 8.1 10*3/uL (ref 4.0–10.5)
nRBC: 0 % (ref 0.0–0.2)

## 2022-09-09 LAB — RETICULOCYTES
Immature Retic Fract: 10.6 % (ref 2.3–15.9)
RBC.: 2.86 MIL/uL — ABNORMAL LOW (ref 4.22–5.81)
Retic Count, Absolute: 66.1 10*3/uL (ref 19.0–186.0)
Retic Ct Pct: 2.3 % (ref 0.4–3.1)

## 2022-09-09 LAB — FOLATE: Folate: 30.6 ng/mL (ref >5.9)

## 2022-09-09 LAB — MAGNESIUM: Magnesium: 1.4 mg/dL — ABNORMAL LOW (ref 1.7–2.4)

## 2022-09-09 LAB — PREALBUMIN: Prealbumin: 21 mg/dL (ref 18–38)

## 2022-09-09 MED ORDER — MAGNESIUM SULFATE 4 GM/100ML IV SOLN
4.0000 g | Freq: Once | INTRAVENOUS | Status: AC
  Administered 2022-09-09: 4 g via INTRAVENOUS
  Filled 2022-09-09: qty 100

## 2022-09-09 MED ORDER — MORPHINE SULFATE (PF) 2 MG/ML IV SOLN
2.0000 mg | Freq: Four times a day (QID) | INTRAVENOUS | Status: DC | PRN
  Administered 2022-09-09: 4 mg via INTRAVENOUS
  Administered 2022-09-09: 2 mg via INTRAVENOUS
  Administered 2022-09-10: 4 mg via INTRAVENOUS
  Filled 2022-09-09 (×2): qty 2
  Filled 2022-09-09: qty 1

## 2022-09-09 MED ORDER — SODIUM CHLORIDE 0.9 % IV BOLUS
500.0000 mL | Freq: Once | INTRAVENOUS | Status: AC
  Administered 2022-09-09: 500 mL via INTRAVENOUS

## 2022-09-09 MED ORDER — LORAZEPAM 0.5 MG PO TABS
0.5000 mg | ORAL_TABLET | Freq: Four times a day (QID) | ORAL | Status: DC | PRN
  Administered 2022-09-09 – 2022-09-11 (×2): 0.5 mg via ORAL
  Filled 2022-09-09 (×2): qty 1

## 2022-09-09 MED ORDER — IOHEXOL 300 MG/ML  SOLN
50.0000 mL | Freq: Once | INTRAMUSCULAR | Status: AC | PRN
  Administered 2022-09-09: 5 mL

## 2022-09-09 NOTE — Progress Notes (Signed)
PROGRESS NOTE    Jerry Knight  FIE:[id number] DOB: 11/18/1961 DOA: 08/16/2022 PCP: Beverley Fiedler, FNP   Brief Narrative:  61 y.o. male with medical history significant of  COPD, chronic pancreatitis, rheumatoid arthritis on Enbrel, history of seizures not on an AED, chronic pain on Suboxone, polysubstance abuse, Zollinger-Ellison syndrome, and constipation who presented with complaints of  lower abdominal pain.  Imaging remarkable for colonic perforation/obstruction  Patient underwent emergent exploratory laparotomy, left colostomy and partial sigmoid colostomy placement on 9/15 and thereafter also requiring left lower quadrant drain by IR on 9/23.  Patient did complete Zosyn followed by Unasyn from 9/14 - 9/28.  Currently getting TPN.  Surgery team working on increasing ostomy output by trying bowel regimen and slowly advancing diet. Encourage ambulation.  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 Active Problems:   Constipation, chronic   Immunosuppression due to drug therapy ([place])   Bowel perforation (HCC)   Sepsis (HCC)   Normocytic anemia   Hypokalemia   COPD (chronic obstructive pulmonary disease) (HCC)   Rheumatoid arthritis (HCC)   Anxiety   GERD (gastroesophageal reflux disease)   Chronic neck and back pain   Tobacco abuse   Opioid dependence on maintenance agonist therapy, no symptoms (HCC)   Psychophysiological insomnia   Protein-calorie malnutrition, severe   Cachexia ([place])   Sepsis secondary to bowel perforation (HCC) due to stercoral ulcer/stool impaction, left  retroperitoneal abscess -Upon admission patient met sepsis criteria.  Underwent emergent laparotomy, left colostomy and partial sigmoid colectomy on 9/15.  Also required left lower quadrant drain placement by IR on 9/23.   Completed course of Zosyn followed by Unasyn from 9/14 - 9/28.  -IR not recommending outpatient follow-up in 10-14 days.Patient will need to flush drain QD with 5 cc NS, record output QD, dressing changes every 2-3 days or earlier if soiled. Notify IR prior to dc so they can schedule f/u - Diet, dressing change in bowel regimen per general surgery.  Surgery planning on drain study in anticipation for removal.   Acute on chronic pain, improved Does not like taking Tylenol/gabapentin but current regimen seems to be working well for him.  On Suboxone.  OxyContin 20 mg twice daily, oxycodone and Zanaflex.  Bowel regimen.  Normocytic anemia -Likely anemia of chronic disease -HTN stable 9-10   Hypokalemia, hyponatremia, hyponatremia - Weaned off TPN.  As needed repletion   COPD, without exacerbation - Chest x-ray with chronic interstitial lung changes with bibasilar atelectasis. -Stable, no wheezing, prn Nebs.    History of rheumatoid arthritis -Outpatient on Enbrel 50 mg and methotrexate 10 mg on Saturdays; on Hold.    Insomnia, anxiety -prn Ativan.  -Continue trazodone 150 mg at bedtime    GERD -Continue PPI   Tobacco abuse Patient reportedly smokes 2 packs of cigarettes per day on average. - cont Nicotine patch   7 mm right lower lobe pulmonary nodule. Non-contrast chest CT at 6-12 months is recommended. If the nodule is stable at time of repeat CT, then future CT at 18-24 months (from today's scan) is considered optional for low-risk patients, but is recommended for high-risk patients   Moderate to severe protein calorie malnutrition due to acute illness, hypoalbuminemia 1.9, muscle wasting Estimated body mass index is 16.88 kg/m as calculated from the following:   Height as of this encounter: 5' 5.5" (  1.664 m).   Weight as of this encounter: 46.7 kg. TPN has been weaned off now.     DVT prophylaxis: enoxaparin (LOVENOX) injection 40 mg Start: 08/25/22 1000 SCD's Start:  08/16/22 1754 SCDs Start: 08/16/22 1600 Code Status: Full Code Family Communication:   Status is: Hopefully discharge in next 24-48 hours depending on how he does over next 24 hours and his drain study  Nutritional status    Signs/Symptoms: severe muscle depletion, severe fat depletion  Interventions: TPN  Body mass index is 18.03 kg/m.       Subjective: Doing ok no new new complaints.  Good po intake yesterday  Examination: Constitutional: Not in acute distress. Frail, temporal wasting Respiratory: Clear to auscultation bilaterally Cardiovascular: Normal sinus rhythm, no rubs Abdomen: Nontender nondistended good bowel sounds Musculoskeletal: No edema noted Skin: No rashes seen Neurologic: CN 2-12 grossly intact.  And nonfocal Psychiatric: Normal judgment and insight. Alert and oriented x 3. Normal mood.    RUE PICC 9/19 LUQ Colostomy LLQ Drain     Objective: Vitals:   09/09/22 0428 09/09/22 0500 09/09/22 0634 09/09/22 0820  BP: (!) 76/64  100/70 100/70  Pulse: 87  86 70  Resp:    16  Temp: 97.9 F (36.6 C)  98.2 F (36.8 C) 98.2 F (36.8 C)  TempSrc: Oral  Oral Oral  SpO2: 97%  98% 98%  Weight:  49.9 kg    Height:        Intake/Output Summary (Last 24 hours) at 09/09/2022 0825 Last data filed at 09/09/2022 0528 Gross per 24 hour  Intake 749.97 ml  Output 1255 ml  Net -505.03 ml   Filed Weights   09/02/22 0500 09/08/22 0500 09/09/22 0500  Weight: 46.7 kg 47.5 kg 49.9 kg     Data Reviewed:   CBC: Recent Labs  Lab 09/03/22 1456 09/05/22 0336 09/06/22 1403 09/09/22 0312  WBC 14.6* 10.7* 13.9* 8.1  HGB 9.8* 9.0* 9.6* 8.8*  HCT 29.8* 27.5* 28.9* 26.7*  MCV 93.7 95.2 93.8 93.7  PLT 441* 322 308 630   Basic Metabolic Panel: Recent Labs  Lab 09/05/22 0336 09/06/22 0304 09/07/22 0306 09/08/22 0316 09/09/22 0312  NA 135 135 136 135 135  K 4.1 4.2 4.0 4.1 3.8  CL 103 99 100 101 104  CO2 _0 GLUCOSE 105* 106* 113* 104*  92  BUN _1 CREATININE 0.58* 0.80 0.61 0.60* 0.67  CALCIUM 8.4* 9.1 9.0 8.9 8.5*  MG 1.7 1.7 1.7 1.6* 1.4*  PHOS 4.0  --   --   --   --    GFR: Estimated Creatinine Clearance: 70.2 mL/min (by C-G formula based on SCr of 0.67 mg/dL). Liver Function Tests: Recent Labs  Lab 09/05/22 0336  AST 16  ALT 12  ALKPHOS 66  BILITOT 0.4  PROT 6.6  ALBUMIN 2.5*   No results for input(s): "LIPASE", "AMYLASE" in the last 168 hours. No results for input(s): "AMMONIA" in the last 168 hours. Coagulation Profile: No results for input(s): "INR", "PROTIME" in the last 168 hours. Cardiac Enzymes: No results for input(s): "CKTOTAL", "CKMB", "CKMBINDEX", "TROPONINI" in the last 168 hours. BNP (last 3 results) No results for input(s): "PROBNP" in the last 8760 hours. HbA1C: Recent Labs    09/08/22 0316  HGBA1C 5.7*   CBG: Recent Labs  Lab 09/04/22 0506 09/04/22 1150 09/04/22 1723 09/04/22 2347 09/05/22 0534  GLUCAP 127* 105* 133* 122* 111*   Lipid Profile:  No results for input(s): "CHOL", "HDL", "LDLCALC", "TRIG", "CHOLHDL", "LDLDIRECT" in the last 72 hours.  Thyroid Function Tests: No results for input(s): "TSH", "T4TOTAL", "FREET4", "T3FREE", "THYROIDAB" in the last 72 hours. Anemia Panel: Recent Labs    09/08/22 0316 09/09/22 0312  VITAMINB12 633  --   FOLATE 30.6  --   FERRITIN 67  --   TIBC 319  --   IRON 71  --   RETICCTPCT  --  2.3   Sepsis Labs: No results for input(s): "PROCALCITON", "LATICACIDVEN" in the last 168 hours.  No results found for this or any previous visit (from the past 240 hour(s)).        Radiology Studies: No results found.      Scheduled Meds:  acetaminophen  1,000 mg Oral TID   amitriptyline  100 mg Oral QHS   atorvastatin  20 mg Oral Daily   buprenorphine-naloxone  1 tablet Sublingual Daily   Chlorhexidine Gluconate Cloth  6 each Topical Daily   cholecalciferol  1,000 Units Oral Daily   enoxaparin (LOVENOX) injection   40 mg Subcutaneous Q24H   feeding supplement (GLUCERNA SHAKE)  237 mL Oral BID BM   folic acid  1 mg Oral Daily   linaclotide  145 mcg Oral QAC breakfast   multivitamin with minerals  1 tablet Oral Daily   nicotine  21 mg Transdermal QHS   oxyCODONE  20 mg Oral Q12H   pantoprazole  40 mg Oral Daily   polyethylene glycol  34 g Oral BID   Ensure Max Protein  11 oz Oral Daily   sodium chloride flush  3 mL Intravenous Q12H   sodium chloride flush  5 mL Intracatheter Q8H   tiZANidine  4 mg Oral Q8H   traZODone  150 mg Oral QHS   Continuous Infusions:  chlorproMAZINE (THORAZINE) 12.5 mg in sodium chloride 0.9 % 25 mL IVPB Stopped (09/08/22 2130)   magnesium sulfate bolus IVPB       LOS: 24 days   Time spent= 35 mins    Birgitta Uhlir Arsenio Loader, MD [facility]  If 7PM-7AM, please contact night-coverage  09/09/2022, 8:25 AM

## 2022-09-09 NOTE — Procedures (Signed)
  Procedure:  DRain injection under fluoro no fistula to bowel; resid retroperitoneal irregular abscess cavity well drained by device; switched to gravity bag, can d/c in 2+ days if persistent low output Preprocedure diagnosis: The primary encounter diagnosis was Bowel perforation ([place]). Diagnoses of Generalized abdominal pain and Gastric outlet obstruction were also pertinent to this visit.  Postprocedure diagnosis: same EBL:    minimal Complications:   none immediate  See full dictation in [employer].  Dillard Cannon MD Main # [phone] Pager  [phone] Mobile [phone]

## 2022-09-09 NOTE — Plan of Care (Signed)

## 2022-09-09 NOTE — Progress Notes (Signed)
Calorie Count Note  48 hour calorie count ordered.  Diet: Heart Healthy diet Supplements: Glucerna BID  09/08/22 Breakfast: 50% of sausage egg McMuffin from [employer].  Lunch: 75-100% Dinner: 75-100%  Supplements: Glucerna - 50% x1  Total intake: 994 kcal (52% of minimum estimated needs)  50 protein (55% of minimum estimated needs)  RD collected tickets from calorie count folder. Only one ticket from 10/8 but RN reports that the pt ate 75-100% of his lunch and dinner tray yesterday. TPN has been discontinued. RD will continue to monitor PO intakes.   Nutrition Dx: Severe Malnutrition related to chronic illness as evidenced by severe muscle depletion, severe fat depletion.  Goal: Patient will meet greater than or equal to 90% of their needs  Intervention:  - Continue kcal count.   - Continue Glucerna Shake po BID, each supplement provides 220 kcal and 10 grams of protein  Thalia Bloodgood, RD, LDN, CNSC.

## 2022-09-09 NOTE — Progress Notes (Signed)
Mobility Specialist Progress Note   09/09/22 1543  Pain Assessment  Pain Assessment 0-10  Pain Score 8  Pain Location Abdomen  Pain Descriptors / Indicators Discomfort;Stabbing  Mobility  Activity Refused mobility   Patient declined mobility second to 8/10 pain in abdomen. Will f/u as time permits.   [place] Daschel Roughton, [place], [place]  ZXYDS:[phone] Office: [phone]

## 2022-09-09 NOTE — Progress Notes (Signed)
Patient educated on decreasing IV pain medication use by using PO instead. Patient verbalized understanding at time of PO administration, but patient requested IV pain medication because "the pill just don't work." Reiterated use of PO and patient continues to verbalized understanding.

## 2022-09-09 NOTE — Progress Notes (Addendum)
24 Days Post-Op  Subjective: CC: Feels he is doing better. States he is sipping glucerna and had almost 100% of his lunch yesterday and 70% of his dinner. Mobilizing in the hall. Having stool via colostomy. Still requiring IV morphine.   Given 500 cc bolus this AM for BP 76/64 w/ improvement to 100/70  Objective: Vital signs in last 24 hours: Temp:  [97.6 F (36.4 C)-98.8 F (37.1 C)] 98.2 F (36.8 C) (10/09 0634) Pulse Rate:  [77-87] 86 (10/09 0634) Resp:  [15-16] 15 (10/08 1700) BP: (76-102)/(51-70) 100/70 (10/09 0634) SpO2:  [95 %-98 %] 98 % (10/09 0634) Weight:  [49.9 kg] 49.9 kg (10/09 0500) Last BM Date : 09/08/22  Intake/Output from previous day: 10/08 0701 - 10/09 0700 In: 750 [P.O.:240; IV Piggyback:505] Out: 3267 [Urine:1400; Drains:5; Stool:350] Intake/Output this shift: No intake/output data recorded.  PE: General: pleasant, WD, male who is laying in bed in NAD Lungs: Respiratory effort nonlabored Abd: Soft, mild distention. overall nontender. Ostomy pouch with gas and semisolid stool, stoma viable. L flank drain with some brown drainage in tubing but clear fluid in bulb - minimal    Lab Results:  Recent Labs    09/06/22 1403 09/09/22 0312  WBC 13.9* 8.1  HGB 9.6* 8.8*  HCT 28.9* 26.7*  PLT 308 265   BMET Recent Labs    09/08/22 0316 09/09/22 0312  NA 135 135  K 4.1 3.8  CL 101 104  CO2 27 27  GLUCOSE 104* 92  BUN 15 14  CREATININE 0.60* 0.67  CALCIUM 8.9 8.5*   PT/INR No results for input(s): "LABPROT", "INR" in the last 72 hours. CMP     Component Value Date/Time   NA 135 09/09/2022 0312   NA 140 10/12/2020 1526   K 3.8 09/09/2022 0312   CL 104 09/09/2022 0312   CO2 27 09/09/2022 0312   GLUCOSE 92 09/09/2022 0312   BUN 14 09/09/2022 0312   BUN 10 10/12/2020 1526   CREATININE 0.67 09/09/2022 0312   CREATININE 0.77 01/27/2013 1105   CALCIUM 8.5 (L) 09/09/2022 0312   PROT 6.6 09/05/2022 0336   PROT 7.5 10/12/2020 1526    ALBUMIN 2.5 (L) 09/05/2022 0336   ALBUMIN 4.5 10/12/2020 1526   AST 16 09/05/2022 0336   ALT 12 09/05/2022 0336   ALKPHOS 66 09/05/2022 0336   BILITOT 0.4 09/05/2022 0336   BILITOT 0.3 10/12/2020 1526   GFRNONAA >60 09/09/2022 0312   GFRAA 123 10/12/2020 1526   Lipase     Component Value Date/Time   LIPASE 39 08/16/2022 0506    Studies/Results: No results found.  Anti-infectives: Anti-infectives (From admission, onward)    Start     Dose/Rate Route Frequency Ordered Stop   08/27/22 1300  Ampicillin-Sulbactam (UNASYN) 3 g in sodium chloride 0.9 % 100 mL IVPB        3 g 200 mL/hr over 30 Minutes Intravenous Every 8 hours 08/27/22 1202 08/29/22 0447   08/17/22 1630  piperacillin-tazobactam (ZOSYN) IVPB 3.375 g  Status:  Discontinued        3.375 g 12.5 mL/hr over 240 Minutes Intravenous Every 8 hours 08/17/22 1540 08/27/22 1202   08/16/22 1500  piperacillin-tazobactam (ZOSYN) IVPB 3.375 g        3.375 g 100 mL/hr over 30 Minutes Intravenous STAT 08/16/22 1446 08/16/22 2032   08/16/22 0615  piperacillin-tazobactam (ZOSYN) IVPB 3.375 g        3.375 g 100 mL/hr over 30 Minutes Intravenous  Once 08/16/22 0609 08/16/22 0713        Assessment/Plan POD 24, s/p ex lap partial sigmoid colectomy, colostomy by Dr. Rosendo Gros on 08/16/22 for Perforated stercoral ulcer - History of chronic pain on suboxone. Off PCA. On scheduled oxycontin, '15mg'$  BID and 5-10 mg oxycodone for breakthrough.  still taking IV morphine - wean from q4h PRN to q6h PRN.  - Cont HH diet and shakes.  - CT 9/22 w/ extraperitoneal fluid collection in LLQ lateral to psoas. S/p IR drain 9/23. Cx with E. Coli. Narrowed to Unasyn. Comp abx 5d after drainage (through 9/28). CT 9/29 with improvement (IR following). No new intra-abdominal abscess. IR drain output low, IR to evaluate for removal today. - Surgical JP drain out 9/28 - Continue Mepitel at base of midline with wtd dressings over bid  - WOC RN following for new  ostomy - Encourage ambulation and IS use - once he is weaned off IV pain meds and we have a plan in place for his drain he will be stable for D/C.    FEN - HH diet, protein shakes, bolus this AM - monitor BP.  VTE - SCDs, Lovenox ID - zosyn 9/14 - 9/26. Unasyn 9/26 - 9/28. None currently. WBC down at 10.7 on last check  Foley - out 9/18. voiding   Per primary Tobacco abuse/COPD - 2 ppd H/O substance abuse H/O opioid dependence - now on suboxone prn H/O seizure d/o Chronic pancreatitis Zollinger-Ellison syndrome GERD Hypoalbuminemic - appears malnourished at baseline due to muscle wasting RA - mtx and enbrel held   LOS: 24 days    Jill Alexanders , [facility] Surgery 09/09/2022, 7:58 AM Please see Amion for pager number during day hours 7:00am-4:30pm   I personally saw the patient and performed a substantive portion of this encounter, including a complete performance of at least one of the key components (MDM, Hx and/or Exam), in conjunction with the Advanced Practice Provider [name], PA-C  IR to evaluate drain today - possible removal.  Imogene Burn. Georgette Dover, MD, [facility] Surgery  General Surgery   09/09/2022 11:31 AM

## 2022-09-09 NOTE — Progress Notes (Signed)
   09/09/22 0428  Assess: MEWS Score  Temp 97.9 F (36.6 C)  BP (!) 76/64  MAP (mmHg) 70  Pulse Rate 87  SpO2 97 %  O2 Device Room Air   Pt on multiple narcotics, requesting q4 hr PRN morphine frequently. Clarene Essex, NP notified of low BP. Pt sleeping, lethargic but arousable. No c/o pain at this time. 500 ml NS bolus ordered.  BP 100/70, HR 86 post bolus. Pt sleeping comfortably.

## 2022-09-10 DIAGNOSIS — K633 Ulcer of intestine: Secondary | ICD-10-CM | POA: Diagnosis not present

## 2022-09-10 LAB — MAGNESIUM: Magnesium: 1.6 mg/dL — ABNORMAL LOW (ref 1.7–2.4)

## 2022-09-10 LAB — BASIC METABOLIC PANEL
Anion gap: 7 (ref 5–15)
BUN: 9 mg/dL (ref 6–20)
CO2: 27 mmol/L (ref 22–32)
Calcium: 8.4 mg/dL — ABNORMAL LOW (ref 8.9–10.3)
Chloride: 102 mmol/L (ref 98–111)
Creatinine, Ser: 0.67 mg/dL (ref 0.61–1.24)
GFR, Estimated: >60 mL/min (ref >60)
Glucose, Bld: 96 mg/dL (ref 70–99)
Potassium: 3.5 mmol/L (ref 3.5–5.1)
Sodium: 136 mmol/L (ref 135–145)

## 2022-09-10 MED ORDER — MORPHINE SULFATE (PF) 2 MG/ML IV SOLN: 2.0000 mg | Freq: Two times a day (BID) | INTRAVENOUS | Status: DC | PRN

## 2022-09-10 MED ORDER — POTASSIUM CHLORIDE CRYS ER 20 MEQ PO TBCR
40.0000 meq | EXTENDED_RELEASE_TABLET | Freq: Once | ORAL | Status: AC
  Administered 2022-09-10: 40 meq via ORAL
  Filled 2022-09-10: qty 2

## 2022-09-10 MED ORDER — MAGNESIUM SULFATE 4 GM/100ML IV SOLN
4.0000 g | Freq: Once | INTRAVENOUS | Status: AC
  Administered 2022-09-10: 4 g via INTRAVENOUS
  Filled 2022-09-10: qty 100

## 2022-09-10 NOTE — Progress Notes (Signed)
Nutrition Follow-up  DOCUMENTATION CODES:  Severe malnutrition in context of chronic illness  INTERVENTION:  Adjust to regular diet Encourage PO intake Per pt's request: Glucerna Shake po BID, each supplement provides 220 kcal and 10 grams of protein, Ensure Max po 1x/d, each supplement provides 150 kcal and 30 grams of protein.  Carnation Instant Breakfast TID- each packet provides 130kcal and 5g protein + milk Monitor the result of the following labs which could be impacted by pt's extensive hx of GI surgeries/resections and inhibiting wound healing: Vitamin C, Zinc, Vitamin A, Vitamin D, Thiamin, folate, Vitamin B12, and Vitamin B6 1000IU of vitamin D daily  NUTRITION DIAGNOSIS:  Severe Malnutrition related to chronic illness as evidenced by severe muscle depletion, severe fat depletion. -remains applicable  GOAL:  Patient will meet greater than or equal to 90% of their needs - diet advanced, supplements in place  MONITOR:  Diet advancement, Labs, I & O's, Weight trends  REASON FOR ASSESSMENT:  Consult New TPN/TNA  ASSESSMENT:  61 y.o. male presented to the ED with abdominal pain. PMH includes COPD, GERD, chronic pancreatitis, polysubstance abuse, and tobacco abuse. Pt admitted with sepsis 2/2 bowel perforation requiring emergent ex-lap.   9/15 - Ex-lap s/p partial sigmoid colectomy, colostomy 9/18 - NGT removed 9/19 - TPN started   9/23 - IR placed drain for left retroperitoneal abscess 10/7 - TPN stopped  Pt resting in bed, breakfast well consumed at bedside, but noted low amount of food was initially ordered. Pt has been drinking the glucerna and some carnation instant breakfast. Kcal count started over the weekend, no tickets available, discontinued. TPN also discontinued.   Noted that pt is set to be discharged tomorrow. Pt has no concerns at this time.   Nutritionally Relevant Medications: Scheduled Meds:  atorvastatin  20 mg Oral Daily   cholecalciferol  1,000  Units Oral Daily   GLUCERNA SHAKE  237 mL Oral BID BM   folic acid  1 mg Oral Daily   linaclotide  145 mcg Oral QAC breakfast   multivitamin with minerals  1 tablet Oral Daily   pantoprazole  40 mg Oral Daily   polyethylene glycol  34 g Oral BID   Ensure Max Protein  11 oz Oral Daily   Continuous Infusions:  chlorproMAZINE (THORAZINE) 12.5 mg in sodium chloride 0.9 % 25 mL IVPB 12.5 mg (09/09/22 2332)   magnesium sulfate bolus IVPB 4 g (09/10/22 0946)   PRN Meds: diphenhydrAMINE, magnesium hydroxide, ondansetron  Labs Reviewed Mg 1.6  Micronutrient Labs: Vitamin A 38.5 (normal) Vitamin B12 1090 (high) Vitamin D 28.02 (low) Folate 18.2 (normal) Thiamine pending Vitamin C pending  NUTRITION - FOCUSED PHYSICAL EXAM: Flowsheet Row Most Recent Value  Orbital Region Severe depletion  Upper Arm Region Severe depletion  Thoracic and Lumbar Region Severe depletion  Buccal Region Severe depletion  Temple Region Moderate depletion  Clavicle Bone Region Severe depletion  Clavicle and Acromion Bone Region Severe depletion  Scapular Bone Region Severe depletion  Dorsal Hand Mild depletion  Patellar Region Unable to assess  Anterior Thigh Region Unable to assess  Posterior Calf Region Unable to assess  Edema (RD Assessment) None  Hair Reviewed  Eyes Reviewed  Mouth Reviewed  Skin Reviewed  Nails Reviewed   Diet Order:   Diet Order             Diet regular Room service appropriate? Yes; Fluid consistency: Thin  Diet effective now  EDUCATION NEEDS:  No education needs have been identified at this time  Skin:  Skin Assessment: Reviewed RN Assessment  Last BM:  10/9 - type 6 via colostomy  Height:  Ht Readings from Last 1 Encounters:  08/16/22 5' 5.5" (1.664 m)   Weight:  Wt Readings from Last 1 Encounters:  09/10/22 48.8 kg    Ideal Body Weight:  64.6 kg  BMI:  Body mass index is 17.63 kg/m.  Estimated Nutritional Needs:  Kcal:   1900-2100 kcal/d Protein:  90-110g/d Fluid:  >/= 1.7 L   Ranell Patrick, RD, LDN Clinical Dietitian RD pager # available in AMION  After hours/weekend pager # available in [facility]

## 2022-09-10 NOTE — Consult Note (Signed)
[place] Nurse ostomy follow up Stoma type/location: LLQ, end colostomy  Stomal assessment/size: 1 1/2" round, budded, moist  Peristomal assessment: NA Treatment options for stomal/peristomal skin: using 2" skin barrier ring Output brown stool; functioning well now Ostomy pouching: 2pc. 2 1/4" with 2" skin barrier ring Education provided: patient is independent with ostomy care; wife to learn wound care for DC tomorrow. Spoke with [facility], requested ostomy outpatient clinic referral per patient request  Enrolled patient in [place] Start Discharge program: Yes  Reviewed supplies in the patient room. Sending home with 7 pouches/skin barriers and barrier rings.  Marked [employer] catalog with items he is using so that he can order from them or other DME company of his choice.   [place], [place], [place]

## 2022-09-10 NOTE — Progress Notes (Signed)
PROGRESS NOTE    Jerry Knight  QJJ:[id number] DOB: 09/20/1961 DOA: 08/16/2022 PCP: Beverley Fiedler, FNP   Brief Narrative:  61 y.o. male with medical history significant of  COPD, chronic pancreatitis, rheumatoid arthritis on Enbrel, history of seizures not on an AED, chronic pain on Suboxone, polysubstance abuse, Zollinger-Ellison syndrome, and constipation who presented with complaints of  lower abdominal pain.  Imaging remarkable for colonic perforation/obstruction  Patient underwent emergent exploratory laparotomy, left colostomy and partial sigmoid colostomy placement on 9/15 and thereafter also requiring left lower quadrant drain by IR on 9/23.  Patient did complete Zosyn followed by Unasyn from 9/14 - 9/28.  Currently getting TPN.  Surgery team working on increasing ostomy output by trying bowel regimen and slowly advancing diet. Encourage ambulation.  His ostomy output has improved, fistula study okay.  General surgery planning to discharge patient tomorrow.   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 Active Problems:   Constipation, chronic   Immunosuppression due to drug therapy ([place])   Bowel perforation (HCC)   Sepsis (HCC)   Normocytic anemia   Hypokalemia   COPD (chronic obstructive pulmonary disease) (HCC)   Rheumatoid arthritis (HCC)   Anxiety   GERD (gastroesophageal reflux disease)   Chronic neck and back pain   Tobacco abuse   Opioid dependence on maintenance agonist therapy, no symptoms (HCC)   Psychophysiological insomnia   Protein-calorie malnutrition, severe   Cachexia ([place])   Sepsis secondary to bowel perforation (HCC) due to stercoral ulcer/stool impaction, left  retroperitoneal abscess -Upon admission patient met sepsis criteria.  Underwent emergent laparotomy, left colostomy and partial sigmoid colectomy on 9/15.  Also required left lower quadrant drain placement by IR on 9/23.   Completed course of Zosyn followed by Unasyn from 9/14 - 9/28.  -IR not recommending outpatient follow-up in 10-14 days.Patient will need to flush drain QD with 5 cc NS, record output QD, dressing changes every 2-3 days or earlier if soiled. Notify IR prior to dc so they can schedule f/u - Diet, dressing change in bowel regimen per general surgery.  General surgery anticipating discharge tomorrow.  Planning to wean off IV morphine today   Acute on chronic pain, improved Does not like taking Tylenol/gabapentin but current regimen seems to be working well for him.  On Suboxone.  OxyContin 20 mg twice daily, oxycodone and Zanaflex.  Bowel regimen.  Wean off IV morphine today  Normocytic anemia -Likely anemia of chronic disease -HTN stable 9-10   Hypokalemia, hyponatremia, hyponatremia - Weaned off TPN.  As needed repletion   COPD, without exacerbation - Chest x-ray with chronic interstitial lung changes with bibasilar atelectasis. -Stable, no wheezing, prn Nebs.    History of rheumatoid arthritis -Outpatient on Enbrel 50 mg and methotrexate 10 mg on Saturdays; on Hold.    Insomnia, anxiety -prn Ativan.  -Trazodone at bedtime   GERD -Continue PPI   Tobacco abuse Patient reportedly smokes 2 packs of cigarettes per day on average. - cont Nicotine patch   7 mm right lower lobe pulmonary nodule. Non-contrast chest CT at 6-12 months is recommended. If the nodule is stable at time of repeat CT, then future CT at 18-24 months (from today's scan) is considered optional for low-risk patients, but is recommended for high-risk patients   Moderate to severe protein calorie malnutrition due to acute illness, hypoalbuminemia 1.9, muscle wasting Estimated body mass index is 16.88 kg/m as calculated from the following:   Height  as of this encounter: 5' 5.5" (1.664 m).   Weight as of this encounter: 46.7 kg. TPN has been weaned off now.     DVT prophylaxis: enoxaparin (LOVENOX) injection 40  mg Start: 08/25/22 1000 SCD's Start: 08/16/22 1754 SCDs Start: 08/16/22 1600 Code Status: Full Code Family Communication:   Status is: Planning to wean off morphine.  General surgery recommending discharge tomorrow  Nutritional status    Signs/Symptoms: severe muscle depletion, severe fat depletion  Interventions: TPN  Body mass index is 17.63 kg/m.       Subjective: Doing okay no complaints  Examination: Constitutional: Not in acute distress.  Bilateral temporal wasting Respiratory: Clear to auscultation bilaterally Cardiovascular: Normal sinus rhythm, no rubs Abdomen: Nontender nondistended good bowel sounds Musculoskeletal: No edema noted Skin: No rashes seen Neurologic: CN 2-12 grossly intact.  And nonfocal Psychiatric: Normal judgment and insight. Alert and oriented x 3. Normal mood.   RUE PICC 9/19 LUQ Colostomy LLQ Drain     Objective: Vitals:   09/09/22 1053 09/10/22 0443 09/10/22 0452 09/10/22 0750  BP: 112/65 (!) 86/60 (!) 91/55 99/71  Pulse: 80 77 71 77  Resp: _0 Temp: 98.2 F (36.8 C) 98.1 F (36.7 C)  98.6 F (37 C)  TempSrc: Oral Oral  Oral  SpO2: 97% 97% 98% 95%  Weight:   48.8 kg   Height:        Intake/Output Summary (Last 24 hours) at 09/10/2022 1131 Last data filed at 09/10/2022 0443 Gross per 24 hour  Intake 25 ml  Output 600 ml  Net -575 ml   Filed Weights   09/08/22 0500 09/09/22 0500 09/10/22 0452  Weight: 47.5 kg 49.9 kg 48.8 kg     Data Reviewed:   CBC: Recent Labs  Lab 09/03/22 1456 09/05/22 0336 09/06/22 1403 09/09/22 0312  WBC 14.6* 10.7* 13.9* 8.1  HGB 9.8* 9.0* 9.6* 8.8*  HCT 29.8* 27.5* 28.9* 26.7*  MCV 93.7 95.2 93.8 93.7  PLT 441* 322 308 242   Basic Metabolic Panel: Recent Labs  Lab 09/05/22 0336 09/06/22 0304 09/07/22 0306 09/08/22 0316 09/09/22 0312 09/10/22 0332  NA 135 135 136 135 135 136  K 4.1 4.2 4.0 4.1 3.8 3.5  CL 103 99 100 101 104 102  CO2 _1 GLUCOSE  105* 106* 113* 104* 92 96  BUN _2 CREATININE 0.58* 0.80 0.61 0.60* 0.67 0.67  CALCIUM 8.4* 9.1 9.0 8.9 8.5* 8.4*  MG 1.7 1.7 1.7 1.6* 1.4* 1.6*  PHOS 4.0  --   --   --   --   --    GFR: Estimated Creatinine Clearance: 68.6 mL/min (by C-G formula based on SCr of 0.67 mg/dL). Liver Function Tests: Recent Labs  Lab 09/05/22 0336  AST 16  ALT 12  ALKPHOS 66  BILITOT 0.4  PROT 6.6  ALBUMIN 2.5*   No results for input(s): "LIPASE", "AMYLASE" in the last 168 hours. No results for input(s): "AMMONIA" in the last 168 hours. Coagulation Profile: No results for input(s): "INR", "PROTIME" in the last 168 hours. Cardiac Enzymes: No results for input(s): "CKTOTAL", "CKMB", "CKMBINDEX", "TROPONINI" in the last 168 hours. BNP (last 3 results) No results for input(s): "PROBNP" in the last 8760 hours. HbA1C: Recent Labs    09/08/22 0316  HGBA1C 5.7*   CBG: Recent Labs  Lab 09/04/22 0506 09/04/22 1150 09/04/22 1723 09/04/22 2347 09/05/22 0534  GLUCAP 127* 105* 133*  122* 111*   Lipid Profile: No results for input(s): "CHOL", "HDL", "LDLCALC", "TRIG", "CHOLHDL", "LDLDIRECT" in the last 72 hours.  Thyroid Function Tests: No results for input(s): "TSH", "T4TOTAL", "FREET4", "T3FREE", "THYROIDAB" in the last 72 hours. Anemia Panel: Recent Labs    09/08/22 0316 09/09/22 0312  VITAMINB12 633  --   FOLATE 30.6  --   FERRITIN 67  --   TIBC 319  --   IRON 71  --   RETICCTPCT  --  2.3   Sepsis Labs: No results for input(s): "PROCALCITON", "LATICACIDVEN" in the last 168 hours.  No results found for this or any previous visit (from the past 240 hour(s)).        Radiology Studies: IR Sinus/Fist Tube Chk-Non GI  Result Date: 09/09/2022 CLINICAL DATA:  Stercoral colitis with perforation, sigmoid colectomy, postop fluid collection, CT drain catheter placement 08/24/2022. Follow-up scan showed resolution of the fluid collection. Minimal output from the drain.  Assess for fistula. EXAM: PELVIC ABSCESS CATHETER INJECTION UNDER FLUOROSCOPY TECHNIQUE: The procedure, risks (including but not limited to bleeding, infection, organ damage), benefits, and alternatives were explained to the patient. Questions regarding the procedure were encouraged and answered. The patient understands and consents to the procedure. Survey fluoroscopic inspection reveals stable position of the left lower quadrant pigtail drain catheter. Injection demonstrates persistent irregular retroperitoneal fluid collection inferior and medial to the drain catheter, which can be nearly completely evacuated with suction. No fistula to bowel. IMPRESSION: 1. No evidence of fistula to bowel. 2. Residual retroperitoneal abscess cavity, well drained by the catheter. The catheter was left to gravity drainage for now. Electronically Signed   By: Lucrezia Europe M.D.   On: 09/09/2022 11:32        Scheduled Meds:  acetaminophen  1,000 mg Oral TID   amitriptyline  100 mg Oral QHS   atorvastatin  20 mg Oral Daily   buprenorphine-naloxone  1 tablet Sublingual Daily   Chlorhexidine Gluconate Cloth  6 each Topical Daily   cholecalciferol  1,000 Units Oral Daily   enoxaparin (LOVENOX) injection  40 mg Subcutaneous Q24H   feeding supplement (GLUCERNA SHAKE)  237 mL Oral BID BM   folic acid  1 mg Oral Daily   linaclotide  145 mcg Oral QAC breakfast   multivitamin with minerals  1 tablet Oral Daily   nicotine  21 mg Transdermal QHS   oxyCODONE  20 mg Oral Q12H   pantoprazole  40 mg Oral Daily   polyethylene glycol  34 g Oral BID   Ensure Max Protein  11 oz Oral Daily   sodium chloride flush  3 mL Intravenous Q12H   sodium chloride flush  5 mL Intracatheter Q8H   tiZANidine  4 mg Oral Q8H   traZODone  150 mg Oral QHS   Continuous Infusions:  chlorproMAZINE (THORAZINE) 12.5 mg in sodium chloride 0.9 % 25 mL IVPB 12.5 mg (09/09/22 2332)   magnesium sulfate bolus IVPB 4 g (09/10/22 0946)     LOS: 25 days    Time spent= 35 mins    Aixa Corsello Arsenio Loader, MD [facility]  If 7PM-7AM, please contact night-coverage  09/10/2022, 11:31 AM

## 2022-09-10 NOTE — Care Management Important Message (Signed)
Important Message  Patient Details  Name: Jerry Knight MRN: [id number] Date of Birth: [birth year]   Medicare Important Message Given:  Yes     Hannah Beat 09/10/2022, 10:59 AM

## 2022-09-10 NOTE — Progress Notes (Addendum)
25 Days Post-Op  Subjective: CC: Feels well today, states he is eating, ambulating in the hallways, and is having stool via colostomy. Still receiving IV morphine PRN.   Objective: Vital signs in last 24 hours: Temp:  [98.1 F (36.7 C)-98.6 F (37 C)] 98.6 F (37 C) (10/10 0750) Pulse Rate:  [71-80] 77 (10/10 0750) Resp:  [16-17] 17 (10/10 0750) BP: (86-112)/(55-71) 99/71 (10/10 0750) SpO2:  [95 %-98 %] 95 % (10/10 0750) Weight:  [48.8 kg] 48.8 kg (10/10 0452) Last BM Date : 09/09/22  Intake/Output from previous day: 10/09 0701 - 10/10 0700 In: 25 [IV Piggyback:25] Out: 600 [Urine:500; Stool:100] Intake/Output this shift: No intake/output data recorded.  PE: General: pleasant, WD, male who is laying in bed in NAD Lungs: Respiratory effort nonlabored Abd: Soft, mild distention. overall nontender. Ostomy pouch with gas and semisolid stool, stoma viable. L flank drain now gravity drainage, mostly serous in tube and very minimal serous drainage in bag.   Lab Results:  Recent Labs    09/09/22 0312  WBC 8.1  HGB 8.8*  HCT 26.7*  PLT 265    BMET Recent Labs    09/09/22 0312 09/10/22 0332  NA 135 136  K 3.8 3.5  CL 104 102  CO2 27 27  GLUCOSE 92 96  BUN 14 9  CREATININE 0.67 0.67  CALCIUM 8.5* 8.4*    PT/INR No results for input(s): "LABPROT", "INR" in the last 72 hours. CMP     Component Value Date/Time   NA 136 09/10/2022 0332   NA 140 10/12/2020 1526   K 3.5 09/10/2022 0332   CL 102 09/10/2022 0332   CO2 27 09/10/2022 0332   GLUCOSE 96 09/10/2022 0332   BUN 9 09/10/2022 0332   BUN 10 10/12/2020 1526   CREATININE 0.67 09/10/2022 0332   CREATININE 0.77 01/27/2013 1105   CALCIUM 8.4 (L) 09/10/2022 0332   PROT 6.6 09/05/2022 0336   PROT 7.5 10/12/2020 1526   ALBUMIN 2.5 (L) 09/05/2022 0336   ALBUMIN 4.5 10/12/2020 1526   AST 16 09/05/2022 0336   ALT 12 09/05/2022 0336   ALKPHOS 66 09/05/2022 0336   BILITOT 0.4 09/05/2022 0336   BILITOT 0.3  10/12/2020 1526   GFRNONAA >60 09/10/2022 0332   GFRAA 123 10/12/2020 1526   Lipase     Component Value Date/Time   LIPASE 39 08/16/2022 0506    Studies/Results: IR Sinus/Fist Tube Chk-Non GI  Result Date: 09/09/2022 CLINICAL DATA:  Stercoral colitis with perforation, sigmoid colectomy, postop fluid collection, CT drain catheter placement 08/24/2022. Follow-up scan showed resolution of the fluid collection. Minimal output from the drain. Assess for fistula. EXAM: PELVIC ABSCESS CATHETER INJECTION UNDER FLUOROSCOPY TECHNIQUE: The procedure, risks (including but not limited to bleeding, infection, organ damage), benefits, and alternatives were explained to the patient. Questions regarding the procedure were encouraged and answered. The patient understands and consents to the procedure. Survey fluoroscopic inspection reveals stable position of the left lower quadrant pigtail drain catheter. Injection demonstrates persistent irregular retroperitoneal fluid collection inferior and medial to the drain catheter, which can be nearly completely evacuated with suction. No fistula to bowel. IMPRESSION: 1. No evidence of fistula to bowel. 2. Residual retroperitoneal abscess cavity, well drained by the catheter. The catheter was left to gravity drainage for now. Electronically Signed   By: Lucrezia Europe M.D.   On: 09/09/2022 11:32    Anti-infectives: Anti-infectives (From admission, onward)    Start     Dose/Rate Route  Frequency Ordered Stop   08/27/22 1300  Ampicillin-Sulbactam (UNASYN) 3 g in sodium chloride 0.9 % 100 mL IVPB        3 g 200 mL/hr over 30 Minutes Intravenous Every 8 hours 08/27/22 1202 08/29/22 0447   08/17/22 1630  piperacillin-tazobactam (ZOSYN) IVPB 3.375 g  Status:  Discontinued        3.375 g 12.5 mL/hr over 240 Minutes Intravenous Every 8 hours 08/17/22 1540 08/27/22 1202   08/16/22 1500  piperacillin-tazobactam (ZOSYN) IVPB 3.375 g        3.375 g 100 mL/hr over 30 Minutes  Intravenous STAT 08/16/22 1446 08/16/22 2032   08/16/22 0615  piperacillin-tazobactam (ZOSYN) IVPB 3.375 g        3.375 g 100 mL/hr over 30 Minutes Intravenous  Once 08/16/22 0609 08/16/22 0713        Assessment/Plan POD 25, s/p ex lap partial sigmoid colectomy, colostomy by Dr. Rosendo Gros on 08/16/22 for Perforated stercoral ulcer - History of chronic pain on suboxone. Off PCA. On scheduled oxycontin, '15mg'$  BID and 5-10 mg oxycodone for breakthrough.  still taking IV morphine - wean from q6h PRN to q12h PRN.  - Cont HH diet and shakes.  - CT 9/22 w/ extraperitoneal fluid collection in LLQ lateral to psoas. S/p IR drain 9/23. Cx with E. Coli. Narrowed to Unasyn. Comp abx 5d after drainage (through 9/28). CT 9/29 with improvement (IR following). No new intra-abdominal abscess. IR drain output low, IR evaluated yesterday, and confirmed no fistula to bowel, only residual abscess cavity with drain in good position. They plan for removal in 2+ days if persistent low output, can be done in outpatient setting. - Surgical JP drain out 9/28 - Continue Mepitel at base of midline with wtd dressings over bid  - WOC RN following for new ostomy - Encourage ambulation and IS use - wound care and drain teaching today, wean IV morphine, plan home tomorrow AM with his wife. Outpatient follow up with IR and Dr. Rosendo Gros.   FEN - HH diet, protein shakes. VTE - SCDs, Lovenox ID - zosyn 9/14 - 9/26. Unasyn 9/26 - 9/28. None currently. WBC down at 8.1 on last check  Foley - out 9/18. voiding   Per primary Tobacco abuse/COPD - 2 ppd H/O substance abuse H/O opioid dependence - now on suboxone prn H/O seizure d/o Chronic pancreatitis Zollinger-Ellison syndrome GERD Hypoalbuminemic - appears malnourished at baseline due to muscle wasting RA - mtx and enbrel held   LOS: 25 days    [name], PA-C [place] , PA-S [facility] 09/10/2022, 9:08 AM Please see Amion for pager number during  day hours 7:00am-4:30pm

## 2022-09-10 NOTE — Progress Notes (Signed)
Mobility Specialist Progress Note   09/10/22 1400  Mobility  Activity Ambulated independently in hallway  Level of Assistance Modified independent, requires aide device or extra time  Assistive Device Other (Comment) (IV Pole)  Distance Ambulated (ft) 1100 ft  Range of Motion/Exercises Active;All extremities  Activity Response Tolerated well   Patient received dangling EOB agreeable to participate in mobility. Ambulated independently with steady gait. Returned to room without complaint or incident. Was left dangling EOB with all needs met, call bell in reach.   Jerry Knight, [place], [place]  ITJLL:[phone] Office: [phone]

## 2022-09-11 ENCOUNTER — Other Ambulatory Visit (HOSPITAL_COMMUNITY): Payer: Self-pay

## 2022-09-11 DIAGNOSIS — K633 Ulcer of intestine: Secondary | ICD-10-CM | POA: Diagnosis not present

## 2022-09-11 LAB — BASIC METABOLIC PANEL
Anion gap: 9 (ref 5–15)
BUN: 7 mg/dL (ref 6–20)
CO2: 26 mmol/L (ref 22–32)
Calcium: 8.7 mg/dL — ABNORMAL LOW (ref 8.9–10.3)
Chloride: 102 mmol/L (ref 98–111)
Creatinine, Ser: 0.63 mg/dL (ref 0.61–1.24)
GFR, Estimated: >60 mL/min (ref >60)
Glucose, Bld: 95 mg/dL (ref 70–99)
Potassium: 4.2 mmol/L (ref 3.5–5.1)
Sodium: 137 mmol/L (ref 135–145)

## 2022-09-11 LAB — CBC
HCT: 26.9 % — ABNORMAL LOW (ref 39.0–52.0)
Hemoglobin: 9 g/dL — ABNORMAL LOW (ref 13.0–17.0)
MCH: 31.1 pg (ref 26.0–34.0)
MCHC: 33.5 g/dL (ref 30.0–36.0)
MCV: 93.1 fL (ref 80.0–100.0)
Platelets: 255 10*3/uL (ref 150–400)
RBC: 2.89 MIL/uL — ABNORMAL LOW (ref 4.22–5.81)
RDW: 14.1 % (ref 11.5–15.5)
WBC: 7 10*3/uL (ref 4.0–10.5)
nRBC: 0 % (ref 0.0–0.2)

## 2022-09-11 LAB — MAGNESIUM: Magnesium: 1.6 mg/dL — ABNORMAL LOW (ref 1.7–2.4)

## 2022-09-11 MED ORDER — BUPRENORPHINE HCL-NALOXONE HCL 8-2 MG SL SUBL: 1.0000 | SUBLINGUAL_TABLET | Freq: Every day | SUBLINGUAL | 0 refills | Status: AC

## 2022-09-11 MED ORDER — OXYCODONE HCL ER 20 MG PO T12A: 20.0000 mg | EXTENDED_RELEASE_TABLET | Freq: Two times a day (BID) | ORAL | 0 refills | Status: DC

## 2022-09-11 MED ORDER — ACETAMINOPHEN 500 MG PO TABS: 1000.0000 mg | ORAL_TABLET | Freq: Three times a day (TID) | ORAL | 0 refills | Status: DC

## 2022-09-11 MED ORDER — OXYCODONE HCL ER 20 MG PO T12A
20.0000 mg | EXTENDED_RELEASE_TABLET | Freq: Two times a day (BID) | ORAL | 0 refills | Status: DC
  Filled 2022-09-11: qty 14, 7d supply, fill #0

## 2022-09-11 MED ORDER — POLYETHYLENE GLYCOL 3350 17 G PO PACK: 34.0000 g | PACK | Freq: Two times a day (BID) | ORAL | 0 refills | Status: DC

## 2022-09-11 MED ORDER — MAGNESIUM GLUCONATE 500 MG PO TABS
500.0000 mg | ORAL_TABLET | Freq: Every day | ORAL | Status: DC
  Administered 2022-09-11: 500 mg via ORAL
  Filled 2022-09-11: qty 1

## 2022-09-11 MED ORDER — OXYCODONE HCL 5 MG PO TABS: 10.0000 mg | ORAL_TABLET | Freq: Four times a day (QID) | ORAL | 0 refills | Status: DC | PRN

## 2022-09-11 MED ORDER — MAGNESIUM HYDROXIDE 400 MG/5ML PO SUSP: 30.0000 mL | Freq: Two times a day (BID) | ORAL | 0 refills | Status: DC | PRN

## 2022-09-11 NOTE — Progress Notes (Signed)
Discharge instructions given to  pt and pt wife. Both verbalized understanding of all teaching and had no further questions. Pt demonstrated a proper dressing change as I observed him. He was also given extra dressing supplies for home. Printed prescription given to pt with discharge paperwork.

## 2022-09-11 NOTE — Progress Notes (Signed)
Patient discharged to home via wheelchair with wife by volunteer staff with all belongings

## 2022-09-11 NOTE — Discharge Summary (Signed)
Physician Discharge Summary  TAG WURTZ XBD:[id number] DOB: [birth year] DOA: 08/16/2022  PCP: Beverley Fiedler, FNP  Admit date: 08/16/2022 Discharge date: 09/11/2022  Admitted From: home Disposition:  home  Recommendations for Outpatient Follow-up:  Follow up with PCP in 1-2 weeks  Home Health: none Equipment/Devices: none  Discharge Condition: stable CODE STATUS: Full code   HPI: Per admitting MD, Jerry Knight is a 61 y.o. male with medical history significant of  COPD, chronic pancreatitis, rheumatoid arthritis on Enbrel, history of seizures not on an AED, chronic pain on Suboxone, polysubstance abuse, solar Ellison syndrome, and constipation who presented with complaints of  lower abdominal pain.  Patient noted symptoms possibly started a week ago and have progressively worsened.  He had a small bowel movement, but had not been able to have a bowel movement in 4 days prior to that.  He reported having nausea and vomiting.  Emesis was noted to be of In the emergency department patient was noted to be tachycardic and tachypneic with all other vital signs maintained.  Labs significant for WBC 22.4, hemoglobin 12.6, potassium 3.4, and lactic acid 3.1. CT concerning for colonic perforation in the region of the sigmoid colon thought secondary to impacted stool. For distention of the stomach concerning for gastric outlet obstruction/stricture at the level of the gastrojejunostomy. Urinalysis did not give significant concern for infection.  Blood cultures had been obtained.  [facility] was initially called to admit the patient, but admission was deferred as surgery was thought to be needed.   Patient had been given IV ketamine, normal saline IV fluids, and Zosyn.  Patient was transferred ED to ED due to severity of his symptoms.  General surgery took patient to the operating room room for exploratory laparotomy where patient was noted to have a large stercoral ulcer into the mesentery with  perforation and purulent peritonitis.  Patient underwent partial sigmoid colectomy with colostomy formation.  After the procedure patient was able to be extubated and clinically stable for which [facility] was called to admit.  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 Active Problems:   Constipation, chronic   Immunosuppression due to drug therapy ([place])   Bowel perforation (HCC)   Sepsis (HCC)   Normocytic anemia   Hypokalemia   COPD (chronic obstructive pulmonary disease) (HCC)   Rheumatoid arthritis (HCC)   Anxiety   GERD (gastroesophageal reflux disease)   Chronic neck and back pain   Tobacco abuse   Opioid dependence on maintenance agonist therapy, no symptoms (HCC)   Psychophysiological insomnia   Protein-calorie malnutrition, severe   Cachexia ([place])  Principal problem Sepsis secondary to bowel perforation (HCC) due to stercoral ulcer/stool impaction, left  retroperitoneal abscess-Upon admission patient met sepsis criteria.  Underwent emergent laparotomy, left colostomy and partial sigmoid colectomy on 9/15.  Also required left lower quadrant drain placement by IR on 9/23.  Completed course of Zosyn followed by Unasyn from 9/14 - 9/28. IR recommending outpatient follow-up in 10-14 days. Patient will need to flush drain QD with 5 cc NS, record output QD, dressing changes every 2-3 days or earlier if soiled. IR notified on the day of discharge  Active problems  Acute on chronic pain, improved -Does not like taking Tylenol/gabapentin but current regimen seems to be working well for him.  On Suboxone.  OxyContin 20 mg twice daily, oxycodone and Zanaflex.   Normocytic anemia -Likely anemia of chronic disease Hypokalemia, hyponatremia, hyponatremia - Weaned  off TPN.  As needed repletion COPD, without exacerbation - Chest x-ray with chronic interstitial lung changes with bibasilar atelectasis. Stable, no  wheezing, prn Nebs.  History of rheumatoid arthritis -Outpatient on Enbrel 50 mg and methotrexate 10 mg on Saturdays Insomnia, anxiety -prn Ativan. Trazodone at bedtime GERD -Continue PPI Tobacco abuse -Patient reportedly smokes 2 packs of cigarettes per day on average. 7 mm right lower lobe pulmonary nodule. Non-contrast chest CT at 6-12 months is recommended.  Moderate to severe protein calorie malnutrition due to acute illness, hypoalbuminemia 1.9, muscle wasting -Estimated body mass index is 16.88 kg/m as calculated from the following:   Discharge Instructions   Allergies as of 09/11/2022       Reactions   Ambien [zolpidem Tartrate] Other (See Comments)   Black out   Iohexol    Pt claims he has chest tightness and sob after iv contrast injections.   Lunesta [eszopiclone] Other (See Comments)   Seroquel [quetiapine Fumarate]    Blackout   Sonata [zaleplon]    "black out'  confused        Medication List     TAKE these medications    acetaminophen 500 MG tablet Commonly known as: TYLENOL Take 2 tablets (1,000 mg total) by mouth 3 (three) times daily.   amitriptyline 50 MG tablet Commonly known as: ELAVIL Take 100 mg by mouth at bedtime.   atorvastatin 20 MG tablet Commonly known as: LIPITOR Take 1 tablet (20 mg total) by mouth daily.   buprenorphine-naloxone 8-2 mg Subl SL tablet Commonly known as: SUBOXONE Place 1 tablet under the tongue daily for 7 days.   cyclobenzaprine 10 MG tablet Commonly known as: FLEXERIL Take 1 tablet (10 mg total) by mouth 3 (three) times daily.   Enbrel 50 MG/ML injection Generic drug: etanercept Inject 50 mg into the skin once a week.   folic acid 1 MG tablet Commonly known as: FOLVITE Take 1 mg by mouth daily.   linaclotide 145 MCG Caps capsule Commonly known as: Linzess Take 1 capsule (145 mcg total) by mouth daily before breakfast.   LORazepam 1 MG tablet Commonly known as: ATIVAN Take 1 mg by mouth 3 (three) times  daily.   magnesium hydroxide 400 MG/5ML suspension Commonly known as: MILK OF MAGNESIA Take 30 mLs by mouth every 12 (twelve) hours as needed for mild constipation, moderate constipation, heartburn or indigestion.   methotrexate 2.5 MG tablet Commonly known as: RHEUMATREX Take 10 mg by mouth once a week.   mirtazapine 45 MG tablet Commonly known as: REMERON Take 1 tablet (45 mg total) by mouth at bedtime.   omeprazole 20 MG capsule Commonly known as: PRILOSEC TAKE 1 CAPSULE(20 MG) BY MOUTH DAILY   oxyCODONE 5 MG immediate release tablet Commonly known as: Oxy IR/ROXICODONE Take 2 tablets (10 mg total) by mouth every 6 (six) hours as needed for breakthrough pain (breakthrough pain).   oxyCODONE 20 mg 12 hr tablet Commonly known as: OXYCONTIN Take 1 tablet (20 mg total) by mouth every 12 (twelve) hours.   polyethylene glycol 17 g packet Commonly known as: MIRALAX / GLYCOLAX Take 34 g by mouth 2 (two) times daily.   traZODone 50 MG tablet Commonly known as: DESYREL Take 3-4 tablets (150-200 mg total) by mouth at bedtime.        Follow-up Information     [facility] Follow up.   Specialty: Home Health Services Contact information: [place] [address] [phone]  Ralene Ok, MD Follow up.   Specialty: General Surgery Why: our office is scheduling you for post-operative follow up. please call to confirm appointment date/time. Contact information: [address] [place] [address] [phone]                IR Sinus/Fist Tube Chk-Non GI  Result Date: 09/09/2022 CLINICAL DATA:  Stercoral colitis with perforation, sigmoid colectomy, postop fluid collection, CT drain catheter placement 08/24/2022. Follow-up scan showed resolution of the fluid collection. Minimal output from the drain. Assess for fistula. EXAM: PELVIC ABSCESS CATHETER INJECTION UNDER FLUOROSCOPY TECHNIQUE: The procedure,  risks (including but not limited to bleeding, infection, organ damage), benefits, and alternatives were explained to the patient. Questions regarding the procedure were encouraged and answered. The patient understands and consents to the procedure. Survey fluoroscopic inspection reveals stable position of the left lower quadrant pigtail drain catheter. Injection demonstrates persistent irregular retroperitoneal fluid collection inferior and medial to the drain catheter, which can be nearly completely evacuated with suction. No fistula to bowel. IMPRESSION: 1. No evidence of fistula to bowel. 2. Residual retroperitoneal abscess cavity, well drained by the catheter. The catheter was left to gravity drainage for now. Electronically Signed   By: Lucrezia Europe M.D.   On: 09/09/2022 11:32   CT ABDOMEN PELVIS WO CONTRAST  Result Date: 08/30/2022 CLINICAL DATA:  Postop abdominal pain.  Bowel perforation. EXAM: CT ABDOMEN AND PELVIS WITHOUT CONTRAST TECHNIQUE: Multidetector CT imaging of the abdomen and pelvis was performed following the standard protocol without IV contrast. RADIATION DOSE REDUCTION: This exam was performed according to the departmental dose-optimization program which includes automated exposure control, adjustment of the mA and/or kV according to patient size and/or use of iterative reconstruction technique. COMPARISON:  08/23/2022. FINDINGS: Lower chest: The heart is normal in size and there is a small pericardial effusion. Patchy atelectasis or infiltrate is noted at the lung bases bilaterally, greater on the left than on the right. There is a 7 mm pleural-based nodule in the right lower lobe, axial image 11. Hepatobiliary: No focal liver abnormality is seen. No gallstones, gallbladder wall thickening, or biliary dilatation. Pancreas: Unremarkable. No pancreatic ductal dilatation or surrounding inflammatory changes. Spleen: Normal in size without focal abnormality. Adrenals/Urinary Tract: The adrenal  glands are within normal limits. There is a cyst in the upper pole of the right kidney. Cortical calcifications and scarring is noted on the right. An extrarenal pelvis is noted on the left. No hydroureteronephrosis bilaterally. The bladder is unremarkable. Stomach/Bowel: Surgical changes are noted at the stomach and in the epigastric region. There is a patulous loop of small bowel in the mid left abdomen at an anastomotic site. No bowel obstruction, free air, or pneumatosis. The patient is status post sigmoidectomy with left lower quadrant colostomy. There is bowel wall thickening involving the distal descending colon at the colostomy site. The appendix is surgically absent. Vascular/Lymphatic: Aortic atherosclerosis. No enlarged abdominal or pelvic lymph nodes. Reproductive: Prostate is unremarkable. Other: Postsurgical changes are noted in the anterior abdominal wall. Small amount of fluid is noted in the left pericolic gutter. A surgical drain terminates in the pelvis in the left lower quadrant lateral to the psoas muscle. Minimal residual soft tissue thickening with a few foci of air identified at the drain tip in the previously described abscess is no longer seen. Musculoskeletal: No acute osseous abnormality. IMPRESSION: 1. Status post sigmoidectomy with left lower quadrant ileostomy. There is mild colonic wall thickening involving the distal descending  colon which is likely related to postsurgical changes. No bowel obstruction. 2. Left lower quadrant pigtail catheter terminating lateral to the psoas muscle on the left. Minimal residual soft tissue density and air is noted. No drainable fluid collection is identified. 3. 7 mm right lower lobe pulmonary nodule. Non-contrast chest CT at 6-12 months is recommended. If the nodule is stable at time of repeat CT, then future CT at 18-24 months (from today's scan) is considered optional for low-risk patients, but is recommended for high-risk patients. This  recommendation follows the consensus statement: Guidelines for Management of Incidental Pulmonary Nodules Detected on CT Images: From the Fleischner Society 2017; Radiology 2017; 284:228-243. 4. Patchy atelectasis or infiltrate at the lung bases bilaterally. 5. Aortic atherosclerosis. Electronically Signed   By: Brett Fairy M.D.   On: 08/30/2022 20:39   CT GUIDED VISCERAL FLUID DRAIN BY PERC CATH  Result Date: 08/24/2022 INDICATION: 61 year old male with fluid and gas collection in left retroperitoneal space concerning for abscess. EXAM: CT GUIDED DRAINAGE OF retroperitoneal ABSCESS TECHNIQUE: Multidetector CT imaging of the abdomen was performed following the standard protocol without IV contrast. RADIATION DOSE REDUCTION: This exam was performed according to the departmental dose-optimization program which includes automated exposure control, adjustment of the mA and/or kV according to patient size and/or use of iterative reconstruction technique. MEDICATIONS: The patient is currently admitted to the hospital and receiving intravenous antibiotics. The antibiotics were administered within an appropriate time frame prior to the initiation of the procedure. ANESTHESIA/SEDATION: Moderate (conscious) sedation was employed during this procedure. A total of Versed mg and Fentanyl mcg was administered intravenously by the radiology nurse. Total intra-service moderate Sedation Time: minutes. The patient's level of consciousness and vital signs were monitored continuously by radiology nursing throughout the procedure under my direct supervision. COMPLICATIONS: None immediate. TECHNIQUE: Informed written consent was obtained from the patient after a thorough discussion of the procedural risks, benefits and alternatives. All questions were addressed. Maximal Sterile Barrier Technique was utilized including caps, mask, sterile gowns, sterile gloves, sterile drape, hand hygiene and skin antiseptic. A timeout was performed  prior to the initiation of the procedure. PROCEDURE: The operative field was prepped with Chlorhexidine in a sterile fashion, and a sterile drape was applied covering the operative field. A sterile gown and sterile gloves were used for the procedure. Local anesthesia was provided with 1% Lidocaine. FINDINGS: A planning axial CT scan was performed. Fluid and gas collection in the left retroperitoneal space posterior to the psoas muscle was successfully identified. A suitable skin entry site was selected and marked. The skin was sterilely prepped and draped in the standard fashion with chlorhexidine skin prep. Local anesthesia was attained by infiltration with 1% lidocaine. A small dermatotomy was made. Under intermittent CT guidance, an 18 gauge trocar needle was advanced into the fluid and gas collection. A 0.035 wire was then coiled in the collection. The skin tract was dilated to twelve [place]. A Cook 12 [place] all-purpose drainage catheter was advanced over the wire and formed. Aspiration yields 140 mL of purulent foul-smelling fluid. Samples were sent for Gram stain and culture. The abscess cavity was then lavaged with saline and the drain secured to the skin with 0 Prolene suture. The drain was connected to JP bulb suction. IMPRESSION: Successful placement of 12 French abscess drain with evacuation of 140 mL purulent fluid. Samples were sent for Gram stain and culture. Electronically Signed   By: Jacqulynn Cadet M.D.   On: 08/24/2022 16:54   CT  ABDOMEN PELVIS WO CONTRAST  Result Date: 08/23/2022 CLINICAL DATA:  Stercoral colitis with ulcer and perforation. Postop from sigmoid colectomy. EXAM: CT ABDOMEN AND PELVIS WITHOUT CONTRAST TECHNIQUE: Multidetector CT imaging of the abdomen and pelvis was performed following the standard protocol without IV contrast. RADIATION DOSE REDUCTION: This exam was performed according to the departmental dose-optimization program which includes automated exposure control,  adjustment of the mA and/or kV according to patient size and/or use of iterative reconstruction technique. COMPARISON:  08/16/2022 FINDINGS: Lower chest: Increased infiltrate or atelectasis in left lower lobe and tiny left pleural effusion. Small pericardial effusion has also increased in size. Hepatobiliary: No mass visualized on this unenhanced exam. Gallbladder is unremarkable. No evidence of biliary ductal dilatation. Pancreas: No mass or inflammatory process visualized on this unenhanced exam. Spleen:  Within normal limits in size. Adrenals/Urinary tract: Mild right renal parenchymal scarring noted. 3 mm left renal calculus is seen. Mild to moderate left hydronephrosis has increased since previous study, and appears to be due to retroperitoneal inflammatory change along the medial aspect of the left psoas. No evidence of ureteral calculi or dilatation. Small bowel gas is noted in the urinary bladder, likely due to recent catheterization. Stomach/Bowel: Postop changes are seen from sigmoid colectomy with left lower quadrant colostomy since prior study. Catheter is seen within the pelvis. No evidence of bowel obstruction. An extraperitoneal fluid and gas collection is seen in the left lower quadrant lateral to the psoas muscle which measures 7.0 x 6.2 cm. No other abnormal fluid collections are identified. Vascular/Lymphatic: No pathologically enlarged lymph nodes identified. No evidence of abdominal aortic aneurysm. Aortic atherosclerotic calcification incidentally noted. Reproductive:  No mass or other significant abnormality. Other:  None. Musculoskeletal:  No suspicious bone lesions identified. IMPRESSION: Postop changes from sigmoid colectomy with left lower quadrant colostomy. 7 cm extraperitoneal collection in left lower quadrant lateral to the psoas muscle, suspicious for abscess. Increased mild to moderate left hydronephrosis, due to inflammatory change in the left retroperitoneum. No evidence of  ureteral calculi or dilatation. Tiny nonobstructing left renal calculus. Left lower lobe atelectasis or infiltrate and tiny left pleural effusion. Small pericardial effusion also present. Electronically Signed   By: Marlaine Hind M.D.   On: 08/23/2022 21:15   [place] EKG SITE RITE  Result Date: 08/20/2022 If Site Rite image not attached, placement could not be confirmed due to current cardiac rhythm.  CT Abdomen Pelvis Wo Contrast  Result Date: 08/16/2022 CLINICAL DATA:  Left lower quadrant abdominal pain. History of solid a Ellison syndrome. Prior appendectomy, cholecystectomy and partial gastrectomy. EXAM: CT ABDOMEN AND PELVIS WITHOUT CONTRAST TECHNIQUE: Multidetector CT imaging of the abdomen and pelvis was performed following the standard protocol without IV contrast. RADIATION DOSE REDUCTION: This exam was performed according to the departmental dose-optimization program which includes automated exposure control, adjustment of the mA and/or kV according to patient size and/or use of iterative reconstruction technique. COMPARISON:  07/14/2017. FINDINGS: Lower chest: Centrilobular emphsyema noted. Dependent atelectasis noted in both lung bases, left greater than right. 8 mm right lower lobe pulmonary nodule on image 13/4 was 7 mm previously, compatible with benign etiology. Hepatobiliary: No suspicious focal abnormality in the liver on this study without intravenous contrast. There is no evidence for gallstones, gallbladder wall thickening, or pericholecystic fluid. No intrahepatic or extrahepatic biliary dilation. Pancreas: No focal mass lesion. No dilatation of the main duct. No intraparenchymal cyst. No peripancreatic edema. Spleen: No splenomegaly. No focal mass lesion. Adrenals/Urinary Tract: No adrenal nodule or mass.  Stable cortical scarring interpolar right kidney with parenchymal calcification. 3-4 mm nonobstructing stone interpolar left kidney is new in the interval. No evidence for hydroureter.  Bladder is distended. Stomach/Bowel: Stomach is markedly distended with food, fluid, and gas. There is some small bowel wall thickening in the left upper quadrant with decompressed small bowel in the right lower quadrant. Terminal ileum unremarkable. The appendix is not well visualized, but there is no edema or inflammation in the region of the cecum. Moderate to large stool volume in the ascending colon and hepatic flexure, otherwise unremarkable. Inspissated formed stool is seen in the distal transverse colon, splenic flexure, and descending colon. There is wall thickening in ill-defined colonic wall in the distal descending segment extending into the sigmoid colon where prominent lamellated formed stool is noted. There is pericolonic edema/inflammation in the sigmoid segment with scattered extraluminal gas in this region which appears to localize to the sigmoid colon in the region of the large formed stool volume (see axial image 58/2 and coronal 48/5). Distal sigmoid colon and rectum are largely free of stool. Low rectal anastomosis evident. Vascular/Lymphatic: There is moderate atherosclerotic calcification of the abdominal aorta without aneurysm. There is no gastrohepatic or hepatoduodenal ligament lymphadenopathy. No retroperitoneal or mesenteric lymphadenopathy. No pelvic sidewall lymphadenopathy. Reproductive: The prostate gland and seminal vesicles are unremarkable. Other: Trace free fluid noted in the left paracolic gutter. Musculoskeletal: No worrisome lytic or sclerotic osseous abnormality. IMPRESSION: 1. Circumferential wall thickening in the distal descending and sigmoid colon with pericolonic edema/inflammation and scattered extraluminal gas in this region. Patient has a large amount of lamellated, inspissated formed stool in the splenic flexure, descending colon and most prominently in the sigmoid segment corresponding to the area of wall thickening and pericolonic edema/fluid/free air. Imaging  features compatible with colonic perforation in the region of the sigmoid colon likely secondary to impacted stool. 2. Small bowel wall thickening with subtle ill definition of small-bowel in the left abdomen just cranial to the abnormal sigmoid colon. Appearance is felt to be secondary to the sigmoid colon process. 3. Marked distention of the stomach with food, fluid, and gas. Imaging features are concerning for an associated component of gastric outlet obstruction/stricture at the level of the gastrojejunostomy. 4. 8 mm right lower lobe pulmonary nodule stable since prior and evaluated by lung cancer screening CT in 2020. Finding is compatible with benign disease. 5. 3-4 mm nonobstructing left renal stone. 6. Aortic Atherosclerosis (ICD10-I70.0) and Emphysema (ICD10-J43.9). I discussed these findings by telephone with Dr. Gustavus Messing at [phone] hours on 08/16/2022. Electronically Signed   By: Misty Stanley M.D.   On: 08/16/2022 07:06   DG Chest Port 1 View  Result Date: 08/16/2022 CLINICAL DATA:  Shortness of breath. EXAM: PORTABLE CHEST 1 VIEW COMPARISON:  07/27/2022 FINDINGS: 0523 hours. The lungs are clear without focal pneumonia, edema, pneumothorax or pleural effusion. Interstitial markings are diffusely coarsened with chronic features. Streaky opacity at the lung bases suggest atelectasis. Bones are demineralized. Telemetry leads overlie the chest. IMPRESSION: Chronic interstitial changes with bibasilar atelectasis. Electronically Signed   By: Misty Stanley M.D.   On: 08/16/2022 05:45     Subjective: - no chest pain, shortness of breath, no abdominal pain, nausea or vomiting.   Discharge Exam: BP 118/71 (BP Location: Left Arm)   Pulse 86   Temp 98 F (36.7 C) (Oral)   Resp 17   Ht 5' 5.5" (1.664 m)   Wt 50.6 kg   SpO2 97%   BMI 18.28 kg/m  General: Pt is alert, awake, not in acute distress Cardiovascular: RRR, S1/S2 +, no rubs, no gallops Respiratory: CTA bilaterally, no wheezing, no  rhonchi Abdominal: Soft, NT, ND, bowel sounds + Extremities: no edema, no cyanosis    The results of significant diagnostics from this hospitalization (including imaging, microbiology, ancillary and laboratory) are listed below for reference.     Microbiology: No results found for this or any previous visit (from the past 240 hour(s)).   Labs: Basic Metabolic Panel: Recent Labs  Lab 09/05/22 0336 09/06/22 0304 09/07/22 0306 09/08/22 0316 09/09/22 0312 09/10/22 0332 09/11/22 0500  NA 135   < > 136 135 135 136 137  K 4.1   < > 4.0 4.1 3.8 3.5 4.2  CL 103   < > 100 101 104 102 102  CO2 24   < > 27 27 27 27 26   GLUCOSE 105*   < > 113* 104* 92 96 95  BUN 16   < > 18 15 14 9 7   CREATININE 0.58*   < > 0.61 0.60* 0.67 0.67 0.63  CALCIUM 8.4*   < > 9.0 8.9 8.5* 8.4* 8.7*  MG 1.7   < > 1.7 1.6* 1.4* 1.6* 1.6*  PHOS 4.0  --   --   --   --   --   --    < > = values in this interval not displayed.   Liver Function Tests: Recent Labs  Lab 09/05/22 0336  AST 16  ALT 12  ALKPHOS 66  BILITOT 0.4  PROT 6.6  ALBUMIN 2.5*   CBC: Recent Labs  Lab 09/05/22 0336 09/06/22 1403 09/09/22 0312 09/11/22 0500  WBC 10.7* 13.9* 8.1 7.0  HGB 9.0* 9.6* 8.8* 9.0*  HCT 27.5* 28.9* 26.7* 26.9*  MCV 95.2 93.8 93.7 93.1  PLT 322 308 265 255   CBG: Recent Labs  Lab 09/04/22 1150 09/04/22 1723 09/04/22 2347 09/05/22 0534  GLUCAP 105* 133* 122* 111*   Hgb A1c No results for input(s): "HGBA1C" in the last 72 hours. Lipid Profile No results for input(s): "CHOL", "HDL", "LDLCALC", "TRIG", "CHOLHDL", "LDLDIRECT" in the last 72 hours. Thyroid function studies No results for input(s): "TSH", "T4TOTAL", "T3FREE", "THYROIDAB" in the last 72 hours.  Invalid input(s): "FREET3" Urinalysis    Component Value Date/Time   COLORURINE YELLOW 08/16/2022 [place] 08/16/2022 0735   APPEARANCEUR Cloudy (A) 04/02/2019 1109   LABSPEC 1.015 08/16/2022 0735   PHURINE 8.5 (H)  08/16/2022 0735   GLUCOSEU NEGATIVE 08/16/2022 0735   HGBUR TRACE (A) 08/16/2022 0735   BILIRUBINUR NEGATIVE 08/16/2022 0735   BILIRUBINUR Negative 04/02/2019 1109   KETONESUR NEGATIVE 08/16/2022 0735   PROTEINUR NEGATIVE 08/16/2022 0735   UROBILINOGEN 0.2 10/11/2014 0312   NITRITE NEGATIVE 08/16/2022 0735   LEUKOCYTESUR NEGATIVE 08/16/2022 0735    FURTHER DISCHARGE INSTRUCTIONS:   Get Medicines reviewed and adjusted: Please take all your medications with you for your next visit with your Primary MD   Laboratory/radiological data: Please request your Primary MD to go over all hospital tests and procedure/radiological results at the follow up, please ask your Primary MD to get all Hospital records sent to his/her office.   In some cases, they will be blood work, cultures and biopsy results pending at the time of your discharge. Please request that your primary care M.D. goes through all the records of your hospital data and follows up on these results.   Also Note the following: If you experience worsening  of your admission symptoms, develop shortness of breath, life threatening emergency, suicidal or homicidal thoughts you must seek medical attention immediately by calling 911 or calling your MD immediately  if symptoms less severe.   You must read complete instructions/literature along with all the possible adverse reactions/side effects for all the Medicines you take and that have been prescribed to you. Take any new Medicines after you have completely understood and accpet all the possible adverse reactions/side effects.    Do not drive when taking Pain medications or sleeping medications (Benzodaizepines)   Do not take more than prescribed Pain, Sleep and Anxiety Medications. It is not advisable to combine anxiety,sleep and pain medications without talking with your primary care practitioner   Special Instructions: If you have smoked or chewed Tobacco  in the last 2 yrs please stop  smoking, stop any regular Alcohol  and or any Recreational drug use.   Wear Seat belts while driving.   Please note: You were cared for by a hospitalist during your hospital stay. Once you are discharged, your primary care physician will handle any further medical issues. Please note that NO REFILLS for any discharge medications will be authorized once you are discharged, as it is imperative that you return to your primary care physician (or establish a relationship with a primary care physician if you do not have one) for your post hospital discharge needs so that they can reassess your need for medications and monitor your lab values.  Time coordinating discharge: 40 minutes  SIGNED:  Marzetta Board, MD, PhD 09/11/2022, 8:30 AM

## 2022-09-11 NOTE — Discharge Instructions (Signed)
[facility]      [facility] [phone]  OPEN ABDOMINAL SURGERY: POST OP INSTRUCTIONS  Always review your discharge instruction sheet given to you by the facility where your surgery was performed.  IF YOU HAVE DISABILITY OR FAMILY LEAVE FORMS, YOU MUST BRING THEM TO THE OFFICE FOR PROCESSING.  PLEASE DO NOT GIVE THEM TO YOUR DOCTOR.  A prescription for pain medication may be given to you upon discharge.  Take your pain medication as prescribed, if needed.  If narcotic pain medicine is not needed, then you may take acetaminophen (Tylenol) or ibuprofen (Advil) as needed. Take your usually prescribed medications unless otherwise directed. If you need a refill on your pain medication, please contact your pharmacy. They will contact our office to request authorization.  Prescriptions will not be filled after 5pm or on week-ends. You should follow a light diet the first few days after arrival home, such as soup and crackers, pudding, etc.unless your doctor has advised otherwise. A high-fiber, low fat diet can be resumed as tolerated.   Be sure to include lots of fluids daily. Most patients will experience some swelling and bruising on the chest and neck area.  Ice packs will help.  Swelling and bruising can take several days to resolve Most patients will experience some swelling and bruising in the area of the incision. Ice pack will help. Swelling and bruising can take several days to resolve..  It is common to experience some constipation if taking pain medication after surgery.  Increasing fluid intake and taking a stool softener will usually help or prevent this problem from occurring.  A mild laxative (Milk of Magnesia or Miralax) should be taken according to package directions if there are no bowel movements after 48 hours.  You may have steri-strips (small skin tapes) in place directly over the incision.  These strips should be left on the skin for 7-10 days.  If your surgeon used skin  glue on the incision, you may shower in 24 hours.  The glue will flake off over the next 2-3 weeks.  Any sutures or staples will be removed at the office during your follow-up visit. You may find that a light gauze bandage over your incision may keep your staples from being rubbed or pulled. You may shower and replace the bandage daily. ACTIVITIES:  You may resume regular (light) daily activities beginning the next day--such as daily self-care, walking, climbing stairs--gradually increasing activities as tolerated.  You may have sexual intercourse when it is comfortable.  Refrain from any heavy lifting or straining until approved by your doctor. You may drive when you no longer are taking prescription pain medication, you can comfortably wear a seatbelt, and you can safely maneuver your car and apply brakes Return to Work: ___________________________________ You should see your doctor in the office for a follow-up appointment approximately two weeks after your surgery.  Make sure that you call for this appointment within a day or two after you arrive home to insure a convenient appointment time. OTHER INSTRUCTIONS:  _____________________________________________________________ _____________________________________________________________  WHEN TO CALL YOUR DOCTOR: Fever over 101.0 Inability to urinate Nausea and/or vomiting Extreme swelling or bruising Continued bleeding from incision. Increased pain, redness, or drainage from the incision. Difficulty swallowing or breathing Muscle cramping or spasms. Numbness or tingling in hands or feet or around lips.  The clinic staff is available to answer your questions during regular business hours.  Please don't hesitate to call and ask to speak to one of   the nurses if you have concerns.  For further questions, please visit [email or web address]  

## 2022-09-11 NOTE — Progress Notes (Signed)
Mobility Specialist - Progress Note   09/11/22 0919  Mobility  Activity Ambulated independently in hallway  Level of Assistance Independent  Assistive Device Cane  Distance Ambulated (ft) 1100 ft  Activity Response Tolerated well  $Mobility charge 1 Mobility    Pt received in bed agreeable to mobility. C/o feeling lightheaded while ambulating. Left in room w/ all needs met.   Paulla Dolly Mobility Specialist

## 2022-09-11 NOTE — Progress Notes (Signed)
IVT consult placed w/note to remove PICC line for discharge. Advised primary RN, Jasmin that in order to discontinue PICC, IVT must receive a PICC removal order.  Consult was cleared and order request made.

## 2022-09-11 NOTE — Progress Notes (Signed)
26 Days Post-Op  Subjective: CC:  NAEO. Eating better. Emptying his own ostomy. Wife coming to learn wound care today before discharge. Patient feels ready to go home. No IV pain meds last 24 hours   Objective: Vital signs in last 24 hours: Temp:  [98 F (36.7 C)-98.4 F (36.9 C)] 98.4 F (36.9 C) (10/11 0910) Pulse Rate:  [74-86] 75 (10/11 0910) Resp:  [17] 17 (10/11 0910) BP: (96-118)/(63-71) 102/63 (10/11 0910) SpO2:  [97 %-99 %] 99 % (10/11 0910) Weight:  [50.6 kg] 50.6 kg (10/11 0500) Last BM Date : 09/11/22  Intake/Output from previous day: 10/10 0701 - 10/11 0700 In: 449.7 [P.O.:360; IV Piggyback:89.7] Out: 1300 [Urine:1300] Intake/Output this shift: Total I/O In: 240 [P.O.:240] Out: 225 [Urine:225]  PE: General: pleasant, WD, male who is laying in bed in NAD Lungs: Respiratory effort nonlabored Abd: Soft, overall nontender. Ostomy pouch with gas and semisolid stool, stoma viable. L flank drain now gravity drainage; midline wound changed by me - fascial dehiscence stable, granulating. Mepitel placed in wound base.  Lab Results:  Recent Labs    09/09/22 0312 09/11/22 0500  WBC 8.1 7.0  HGB 8.8* 9.0*  HCT 26.7* 26.9*  PLT 265 255   BMET Recent Labs    09/10/22 0332 09/11/22 0500  NA 136 137  K 3.5 4.2  CL 102 102  CO2 27 26  GLUCOSE 96 95  BUN 9 7  CREATININE 0.67 0.63  CALCIUM 8.4* 8.7*   PT/INR No results for input(s): "LABPROT", "INR" in the last 72 hours. CMP     Component Value Date/Time   NA 137 09/11/2022 0500   NA 140 10/12/2020 1526   K 4.2 09/11/2022 0500   CL 102 09/11/2022 0500   CO2 26 09/11/2022 0500   GLUCOSE 95 09/11/2022 0500   BUN 7 09/11/2022 0500   BUN 10 10/12/2020 1526   CREATININE 0.63 09/11/2022 0500   CREATININE 0.77 01/27/2013 1105   CALCIUM 8.7 (L) 09/11/2022 0500   PROT 6.6 09/05/2022 0336   PROT 7.5 10/12/2020 1526   ALBUMIN 2.5 (L) 09/05/2022 0336   ALBUMIN 4.5 10/12/2020 1526   AST 16 09/05/2022  0336   ALT 12 09/05/2022 0336   ALKPHOS 66 09/05/2022 0336   BILITOT 0.4 09/05/2022 0336   BILITOT 0.3 10/12/2020 1526   GFRNONAA >60 09/11/2022 0500   GFRAA 123 10/12/2020 1526   Lipase     Component Value Date/Time   LIPASE 39 08/16/2022 0506    Studies/Results: IR Sinus/Fist Tube Chk-Non GI  Result Date: 09/09/2022 CLINICAL DATA:  Stercoral colitis with perforation, sigmoid colectomy, postop fluid collection, CT drain catheter placement 08/24/2022. Follow-up scan showed resolution of the fluid collection. Minimal output from the drain. Assess for fistula. EXAM: PELVIC ABSCESS CATHETER INJECTION UNDER FLUOROSCOPY TECHNIQUE: The procedure, risks (including but not limited to bleeding, infection, organ damage), benefits, and alternatives were explained to the patient. Questions regarding the procedure were encouraged and answered. The patient understands and consents to the procedure. Survey fluoroscopic inspection reveals stable position of the left lower quadrant pigtail drain catheter. Injection demonstrates persistent irregular retroperitoneal fluid collection inferior and medial to the drain catheter, which can be nearly completely evacuated with suction. No fistula to bowel. IMPRESSION: 1. No evidence of fistula to bowel. 2. Residual retroperitoneal abscess cavity, well drained by the catheter. The catheter was left to gravity drainage for now. Electronically Signed   By: Lucrezia Europe M.D.   On: 09/09/2022 11:32  Anti-infectives: Anti-infectives (From admission, onward)    Start     Dose/Rate Route Frequency Ordered Stop   08/27/22 1300  Ampicillin-Sulbactam (UNASYN) 3 g in sodium chloride 0.9 % 100 mL IVPB        3 g 200 mL/hr over 30 Minutes Intravenous Every 8 hours 08/27/22 1202 08/29/22 0447   08/17/22 1630  piperacillin-tazobactam (ZOSYN) IVPB 3.375 g  Status:  Discontinued        3.375 g 12.5 mL/hr over 240 Minutes Intravenous Every 8 hours 08/17/22 1540 08/27/22 1202    08/16/22 1500  piperacillin-tazobactam (ZOSYN) IVPB 3.375 g        3.375 g 100 mL/hr over 30 Minutes Intravenous STAT 08/16/22 1446 08/16/22 2032   08/16/22 0615  piperacillin-tazobactam (ZOSYN) IVPB 3.375 g        3.375 g 100 mL/hr over 30 Minutes Intravenous  Once 08/16/22 0609 08/16/22 0713        Assessment/Plan POD 26, s/p ex lap partial sigmoid colectomy, colostomy by Dr. Rosendo Gros on 08/16/22 for Perforated stercoral ulcer - History of chronic pain on suboxone. Off PCA. On scheduled oxycontin, '15mg'$  BID and 5-10 mg oxycodone for breakthrough.  still taking IV morphine - wean from q6h PRN to q12h PRN.  - Cont HH diet and shakes.  - CT 9/22 w/ extraperitoneal fluid collection in LLQ lateral to psoas. S/p IR drain 9/23. Cx with E. Coli. Narrowed to Unasyn. Comp abx 5d after drainage (through 9/28). CT 9/29 with improvement (IR following). No new intra-abdominal abscess. IR drain output low, IR evaluated yesterday, and confirmed no fistula to bowel, only residual abscess cavity with drain in good position. They plan for removal in 2+ days if persistent low output, can be done in outpatient setting. - Surgical JP drain out 9/28 - Continue Mepitel at base of midline with wtd dressings over bid  - WOC RN following for new ostomy - Encourage ambulation and IS use - Stable for discharge. Wife and patient to change wound today independently before discharge. Outpatient follow up with IR and Dr. Rosendo Gros. I sent his pain Rx.    FEN - HH diet, protein shakes. VTE - SCDs, Lovenox ID - zosyn 9/14 - 9/26. Unasyn 9/26 - 9/28. None currently. WBC down at 8.1 on last check  Foley - out 9/18. Voiding    Per primary Tobacco abuse/COPD - 2 ppd H/O substance abuse H/O opioid dependence - now on suboxone prn H/O seizure d/o Chronic pancreatitis Zollinger-Ellison syndrome GERD Hypoalbuminemic - appears malnourished at baseline due to muscle wasting RA - mtx and enbrel held   LOS: 26 days     [name], PA-C Jill Alexanders , PA-S [facility] 09/11/2022, 9:52 AM Please see Amion for pager number during day hours 7:00am-4:30pm

## 2022-09-13 ENCOUNTER — Other Ambulatory Visit: Payer: Self-pay | Admitting: Surgery

## 2022-09-13 DIAGNOSIS — K633 Ulcer of intestine: Secondary | ICD-10-CM

## 2022-09-13 DIAGNOSIS — K6819 Other retroperitoneal abscess: Secondary | ICD-10-CM

## 2022-09-14 DIAGNOSIS — D649 Anemia, unspecified: Secondary | ICD-10-CM | POA: Diagnosis not present

## 2022-09-14 DIAGNOSIS — Z48815 Encounter for surgical aftercare following surgery on the digestive system: Secondary | ICD-10-CM | POA: Diagnosis not present

## 2022-09-14 DIAGNOSIS — K861 Other chronic pancreatitis: Secondary | ICD-10-CM | POA: Diagnosis not present

## 2022-09-14 DIAGNOSIS — J9811 Atelectasis: Secondary | ICD-10-CM | POA: Diagnosis not present

## 2022-09-14 DIAGNOSIS — M069 Rheumatoid arthritis, unspecified: Secondary | ICD-10-CM | POA: Diagnosis not present

## 2022-09-14 DIAGNOSIS — Z4801 Encounter for change or removal of surgical wound dressing: Secondary | ICD-10-CM | POA: Diagnosis not present

## 2022-09-14 DIAGNOSIS — K219 Gastro-esophageal reflux disease without esophagitis: Secondary | ICD-10-CM | POA: Diagnosis not present

## 2022-09-14 DIAGNOSIS — Z433 Encounter for attention to colostomy: Secondary | ICD-10-CM | POA: Diagnosis not present

## 2022-09-14 DIAGNOSIS — J449 Chronic obstructive pulmonary disease, unspecified: Secondary | ICD-10-CM | POA: Diagnosis not present

## 2022-09-14 LAB — VITAMIN C: Vitamin C: <0.1 mg/dL — ABNORMAL LOW (ref 0.4–2.0)

## 2022-09-15 LAB — VITAMIN B1: Vitamin B1 (Thiamine): 160.8 nmol/L (ref 66.5–200.0)

## 2022-09-16 ENCOUNTER — Ambulatory Visit (HOSPITAL_COMMUNITY): Payer: Medicare HMO | Admitting: Nurse Practitioner

## 2022-09-17 ENCOUNTER — Ambulatory Visit (HOSPITAL_COMMUNITY)
Admission: RE | Admit: 2022-09-17 | Discharge: 2022-09-17 | Disposition: A | Discharge status: Home / Self Care | Payer: Medicare HMO | Source: Ambulatory Visit | Attending: Endocrinology | Admitting: Endocrinology

## 2022-09-17 DIAGNOSIS — Z433 Encounter for attention to colostomy: Secondary | ICD-10-CM

## 2022-09-17 DIAGNOSIS — Z933 Colostomy status: Secondary | ICD-10-CM | POA: Diagnosis not present

## 2022-09-17 DIAGNOSIS — K5901 Slow transit constipation: Secondary | ICD-10-CM

## 2022-09-17 NOTE — Progress Notes (Signed)
[facility]   Reason for visit:  LLQ end colostomy HPI:  Perforated stercoral ulcer  history chronic pain ROS  Review of Systems  Constitutional:  Positive for fatigue.  Gastrointestinal:        LLQ colostomy Midline abdominal incision  Skin:  Positive for wound.  Psychiatric/Behavioral: Negative.    All other systems reviewed and are negative.  Vital signs:  BP 129/78 (BP Location: Right Arm)   Pulse (!) 104   Temp 97.9 F (36.6 C) (Oral)   Resp 18   SpO2 96%  Exam:  Physical Exam Vitals reviewed.  Abdominal:     General: Abdomen is flat.     Comments: LLQ colostomy\ Midline abdominal wound  Skin:    General: Skin is warm and dry.  Neurological:     Mental Status: He is alert and oriented to person, place, and time.  Psychiatric:        Mood and Affect: Mood normal.        Behavior: Behavior normal.     Stoma type/location:  LLQ end colostomy Stomal assessment/size:  1 3/8" pink and moist  producing thick brown stool Peristomal assessment:  intact Treatment options for stomal/peristomal skin: barrier ring and 2 piece 2 1/4" pouch Output: thick brown stool.  Consistent with slow motility Ostomy pouching: 2pc. 2 1/4" pouch with barrier ring  Education provided:  wife at bedside  We provide pouch change.  He is having a lot of constipation, will begin miralax twice daily.     Impression/dx  Constipation  colostomy Discussion  See back as needed. Increase fiber, fluids and activity to promote motility Plan  See back as needed.     Visit time: 45 minutes.   Domenic Moras FNP-BC

## 2022-09-19 DIAGNOSIS — Z433 Encounter for attention to colostomy: Secondary | ICD-10-CM | POA: Insufficient documentation

## 2022-09-24 ENCOUNTER — Other Ambulatory Visit: Payer: Medicare HMO

## 2022-09-26 ENCOUNTER — Ambulatory Visit
Admission: RE | Admit: 2022-09-26 | Discharge: 2022-09-26 | Disposition: A | Discharge status: Home / Self Care | Payer: Medicare HMO | Source: Ambulatory Visit | Attending: Surgery | Admitting: Surgery

## 2022-09-26 ENCOUNTER — Ambulatory Visit
Admission: RE | Admit: 2022-09-26 | Discharge: 2022-09-26 | Disposition: A | Discharge status: Home / Self Care | Payer: Medicare HMO | Source: Ambulatory Visit | Attending: Student | Admitting: Student

## 2022-09-26 DIAGNOSIS — K6819 Other retroperitoneal abscess: Secondary | ICD-10-CM

## 2022-09-26 DIAGNOSIS — N200 Calculus of kidney: Secondary | ICD-10-CM | POA: Diagnosis not present

## 2022-09-26 DIAGNOSIS — Z4682 Encounter for fitting and adjustment of non-vascular catheter: Secondary | ICD-10-CM | POA: Diagnosis not present

## 2022-09-26 DIAGNOSIS — K633 Ulcer of intestine: Secondary | ICD-10-CM

## 2022-09-26 DIAGNOSIS — R109 Unspecified abdominal pain: Secondary | ICD-10-CM | POA: Diagnosis not present

## 2022-09-26 DIAGNOSIS — K651 Peritoneal abscess: Secondary | ICD-10-CM | POA: Diagnosis not present

## 2022-09-26 HISTORY — PX: IR RADIOLOGIST EVAL & MGMT: IMG5224

## 2022-09-26 NOTE — Progress Notes (Signed)
Referring Physician(s): Segal,Matthew L  Chief Complaint: The patient is seen in follow up today s/p left retroperitoneal abscess drain placement.  History of present illness:  Jerry Knight is a 61 year old gentleman who underwent sigmoid colectomy and colostomy for treatment of perforated stercoral ulcer on 08/16/2022.  LLQ abscess was identified on CT on 08/23/2022 and subsequently drained with CT guidance on 08/24/22.    Jerry Knight returns to clinic today for drain management.  Jerry Knight feels better and denies fever, chills, nausea or vomiting.  Jerry Knight has had minimal drain output.  Past Medical History:  Diagnosis Date   Blood transfusion without reported diagnosis    Chest pain    Chronic neck pain    Chronic pain    Chronic pancreatitis (HCC)    COPD (chronic obstructive pulmonary disease) (HCC)    Degenerative disk disease    C 5 &C 6   Depression    GI bleed    Gout    Migraine    Migraine headache    Pain management contract agreement    Seizure disorder ([place])    x 7 years   Seizures ([place])    Substance abuse ([place])    Has been cleaned for 18 mos   Zollinger-Ellison syndrome     Past Surgical History:  Procedure Laterality Date   APPENDECTOMY     per CT 2012- s/p appendectomy   CHOLECYSTECTOMY     EYE SURGERY     HERNIA REPAIR     IR RADIOLOGIST EVAL & MGMT  09/26/2022   IR SINUS/FIST TUBE CHK-NON GI  09/09/2022   LAPAROTOMY N/A 08/16/2022   Procedure: EXPLORATORY LAPAROTOMY;  Surgeon: Ralene Ok, MD;  Location: [facility];  Service: General;  Laterality: N/A;   OSTOMY Left 08/16/2022   Procedure: COLOSTOMY;  Surgeon: Ralene Ok, MD;  Location: [facility];  Service: General;  Laterality: Left;   PARTIAL COLECTOMY N/A 08/16/2022   Procedure: PARTIAL SIGMOID COLECTOMY;  Surgeon: Ralene Ok, MD;  Location: [facility];  Service: General;  Laterality: N/A;   PARTIAL GASTRECTOMY     SMALL INTESTINE SURGERY      Allergies: Ambien [zolpidem tartrate], Iohexol, Lunesta  [eszopiclone], Seroquel [quetiapine fumarate], and Sonata [zaleplon]  Medications: Prior to Admission medications   Medication Sig Start Date End Date Taking? Authorizing Provider  acetaminophen (TYLENOL) 500 MG tablet Take 2 tablets (1,000 mg total) by mouth 3 (three) times daily. 09/11/22   Jill Alexanders, PA-C  amitriptyline (ELAVIL) 50 MG tablet Take 100 mg by mouth at bedtime.    [provider]  atorvastatin (LIPITOR) 20 MG tablet Take 1 tablet (20 mg total) by mouth daily. 02/19/21   Just, Laurita Quint, FNP  cyclobenzaprine (FLEXERIL) 10 MG tablet Take 1 tablet (10 mg total) by mouth 3 (three) times daily. 02/19/21   Just, Laurita Quint, FNP  etanercept (ENBREL) 50 MG/ML injection Inject 50 mg into the skin once a week.    [provider]  folic acid (FOLVITE) 1 MG tablet Take 1 mg by mouth daily. 08/06/22   [provider]  linaclotide Rolan Lipa) 145 MCG CAPS capsule Take 1 capsule (145 mcg total) by mouth daily before breakfast. 02/19/21   Just, Laurita Quint, FNP  LORazepam (ATIVAN) 1 MG tablet Take 1 mg by mouth 3 (three) times daily. 07/18/22   [provider]  magnesium hydroxide (MILK OF MAGNESIA) 400 MG/5ML suspension Take 30 mLs by mouth every 12 (twelve) hours as needed for mild constipation, moderate constipation,  heartburn or indigestion. 09/11/22   Jill Alexanders, PA-C  methotrexate (RHEUMATREX) 2.5 MG tablet Take 10 mg by mouth once a week. 06/08/22   [provider]  mirtazapine (REMERON) 45 MG tablet Take 1 tablet (45 mg total) by mouth at bedtime. 02/19/21   Just, Laurita Quint, FNP  omeprazole (PRILOSEC) 20 MG capsule TAKE 1 CAPSULE(20 MG) BY MOUTH DAILY 02/19/21   Just, Laurita Quint, FNP  oxyCODONE (OXY IR/ROXICODONE) 5 MG immediate release tablet Take 2 tablets (10 mg total) by mouth every 6 (six) hours as needed for breakthrough pain (breakthrough pain). 09/11/22   Jill Alexanders, PA-C  oxyCODONE (OXYCONTIN) 20 mg 12 hr tablet Take 1 tablet (20  mg total) by mouth every 12 (twelve) hours. 09/11/22   Jill Alexanders, PA-C  polyethylene glycol (MIRALAX / GLYCOLAX) 17 g packet Take 34 g by mouth 2 (two) times daily. 09/11/22   Jill Alexanders, PA-C  traZODone (DESYREL) 50 MG tablet Take 3-4 tablets (150-200 mg total) by mouth at bedtime. 02/19/21   Just, Laurita Quint, FNP     Family History  Problem Relation Age of Onset   Angina Mother    Nephrolithiasis Father    Cancer Brother     Social History   Socioeconomic History   Marital status: Married    Spouse name: Not on file   Number of children: Not on file   Years of education: Not on file   Highest education level: Not on file  Occupational History   Not on file  Tobacco Use   Smoking status: Every Day    Packs/day: 2.00    Types: Cigarettes   Smokeless tobacco: Never   Tobacco comments:    about 2 packs a day  Vaping Use   Vaping Use: Never used  Substance and Sexual Activity   Alcohol use: No   Drug use: Yes    Types: Marijuana, Other-see comments    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   Sexual activity: Not on file  Other Topics Concern   Not on file  Social History Narrative   Not on file   Social Determinants of Health   Financial Resource Strain: Not on file  Food Insecurity: No Food Insecurity (08/23/2022)   Hunger Vital Sign    Worried About Running Out of Food in the Last Year: Never true    Ran Out of Food in the Last Year: Never true  Transportation Needs: No Transportation Needs (08/23/2022)   PRAPARE - Hydrologist (Medical): No    Lack of Transportation (Non-Medical): No  Physical Activity: Not on file  Stress: Not on file  Social Connections: Not on file     Vital Signs: BP 103/73 (BP Location: Right Arm)   Pulse (!) 111   Temp 98.4 F (36.9 C) (Oral)   SpO2 97%   Physical Exam Constitutional:      Appearance: Normal appearance.  Pulmonary:     Effort: Pulmonary effort is normal.   Abdominal:     General: Abdomen is flat.     Palpations: Abdomen is soft.       Comments: LLQ drain  Skin:    General: Skin is warm and dry.  Neurological:     Mental Status: Jerry Knight is alert.  Psychiatric:        Mood and Affect: Mood normal.     Imaging: CT ABDOMEN PELVIS WO CONTRAST  Result Date: 09/26/2022  CLINICAL DATA:  61 year old gentleman with postop abdominal pain abdominal abscess, treated by CT guided drain placement on 08/24/2022 returns to IR clinic for drain evaluation. EXAM: CT ABDOMEN AND PELVIS WITHOUT CONTRAST TECHNIQUE: Multidetector CT imaging of the abdomen and pelvis was performed following the standard protocol without IV contrast. RADIATION DOSE REDUCTION: This exam was performed according to the departmental dose-optimization program which includes automated exposure control, adjustment of the mA and/or kV according to patient size and/or use of iterative reconstruction technique. COMPARISON:  08/30/2022 FINDINGS: Lower chest: Emphysematous changes seen at the lung bases. 7 mm pleural based nodule in the posterolateral right lower lobe (11/9) and 4 mm nodule in the posterolateral left lower lobe (4/9) are not appear significantly changed in size since 07/14/2017 which is consistent with a benign etiology. Minimal pericardial effusion is unchanged since prior examination. Hepatobiliary: No focal liver abnormality is seen. No gallstones, gallbladder wall thickening, or biliary dilatation. Pancreas: Unremarkable. No pancreatic ductal dilatation or surrounding inflammatory changes. Spleen: Normal in size without focal abnormality. Adrenals/Urinary Tract: Adrenal glands are normal. Unchanged cortical thinning and calcifications in the upper pole of the right kidney. 4 mm nonobstructing calculus seen in the mid left kidney. No significant abnormality of the ureters or bladder. Stomach/Bowel: Surgical clips again noted near the gastroesophageal junction and along the lesser  curvature of the stomach. There appears to have been prior partial gastrectomy. Gastroenteric anastomosis noted in the left abdomen. No bowel dilatation to indicate ileus or obstruction. Postsurgical changes of appendectomy are noted. Excluded Hartmann's pouch noted distally. Left lower quadrant colostomy again seen. Left retroperitoneal abscess drain is unchanged in position. No residual abscess is identified. Vascular/Lymphatic: Scattered calcified plaque seen throughout the abdominal aorta and visualized branches. No enlarged abdominal or pelvic lymph nodes. Reproductive: Prostate is unremarkable. Other: Progressive healing of anterior mid abdominal wound. Musculoskeletal: No acute or significant osseous findings. IMPRESSION: 1. Left retroperitoneal abscess drain is unchanged in position. No residual abscess identified. 2. Postsurgical changes of colonic resection and left lower quadrant ostomy again seen. No evidence of bowel obstruction. 3. 4 mm nonobstructing left renal calculus. Aortic Atherosclerosis (ICD10-I70.0). Electronically Signed   By: Miachel Roux M.D.   On: 09/26/2022 11:27   IR Radiologist Eval & Mgmt  Result Date: 09/26/2022 EXAM: Abdominal abscess drain follow-up CHIEF COMPLAINT: Please see Epic Note HISTORY OF PRESENT ILLNESS: Past Medical History: Please see Epic Note Medications: Please see Epic Note Allergies: Please see Epic Note Social History: Please see Epic Note Family History:  Please see Epic Note REVIEW OF SYSTEMS: Please see Epic Note PHYSICAL EXAMINATION: Please see Epic Note ASSESSMENT AND PLAN: Please see Epic Note Electronically Signed   By: Miachel Roux M.D.   On: 09/26/2022 10:19    Labs:  CBC: Recent Labs    09/05/22 0336 09/06/22 1403 09/09/22 0312 09/11/22 0500  WBC 10.7* 13.9* 8.1 7.0  HGB 9.0* 9.6* 8.8* 9.0*  HCT 27.5* 28.9* 26.7* 26.9*  PLT 322 308 265 255    COAGS: Recent Labs    08/24/22 1415  INR 1.1    BMP: Recent Labs    09/08/22 0316  09/09/22 0312 09/10/22 0332 09/11/22 0500  NA 135 135 136 137  K 4.1 3.8 3.5 4.2  CL 101 104 102 102  CO2 '27 27 27 26  '$ GLUCOSE 104* 92 96 95  BUN '15 14 9 7  '$ CALCIUM 8.9 8.5* 8.4* 8.7*  CREATININE 0.60* 0.67 0.67 0.63  GFRNONAA >60 >60 >60 >60  LIVER FUNCTION TESTS: Recent Labs    08/26/22 0319 08/29/22 0308 09/02/22 0423 09/05/22 0336  BILITOT 0.5 0.3 0.2* 0.4  AST '18 19 15 16  '$ ALT '13 14 14 12  '$ ALKPHOS 82 76 72 66  PROT 7.0 7.3 7.8 6.6  ALBUMIN 2.0* 2.2* 2.5* 2.5*    Assessment and Plan:  61 year old gentleman with history of left retroperitoneal abscess s/p drain placement on 08/24/22 returns to IR clinic for follow up.  Jerry Knight feels well.  CT performed today shows resolution of the abscess.  Jerry Knight has had minimal drain output.    The drain was removed at bedside without difficult.  Insertion site was covered with sterile dressing.  Electronically Signed: Paula Libra Mady Oubre 09/26/2022, 11:37 AM   I spent a total of 15 Minutes in face to face in clinical consultation, greater than 50% of which was counseling/coordinating care for abdominal drain.

## 2022-10-01 DIAGNOSIS — Z0000 Encounter for general adult medical examination without abnormal findings: Secondary | ICD-10-CM | POA: Diagnosis not present

## 2022-10-01 DIAGNOSIS — K56609 Unspecified intestinal obstruction, unspecified as to partial versus complete obstruction: Secondary | ICD-10-CM | POA: Diagnosis not present

## 2022-10-01 DIAGNOSIS — F411 Generalized anxiety disorder: Secondary | ICD-10-CM | POA: Diagnosis not present

## 2022-10-01 DIAGNOSIS — K219 Gastro-esophageal reflux disease without esophagitis: Secondary | ICD-10-CM | POA: Diagnosis not present

## 2022-10-01 DIAGNOSIS — G4700 Insomnia, unspecified: Secondary | ICD-10-CM | POA: Diagnosis not present

## 2022-10-01 DIAGNOSIS — Z681 Body mass index (BMI) 19 or less, adult: Secondary | ICD-10-CM | POA: Diagnosis not present

## 2022-10-01 DIAGNOSIS — R5383 Other fatigue: Secondary | ICD-10-CM | POA: Diagnosis not present

## 2022-10-01 DIAGNOSIS — Z125 Encounter for screening for malignant neoplasm of prostate: Secondary | ICD-10-CM | POA: Diagnosis not present

## 2022-10-01 DIAGNOSIS — E559 Vitamin D deficiency, unspecified: Secondary | ICD-10-CM | POA: Diagnosis not present

## 2022-10-01 DIAGNOSIS — Z79899 Other long term (current) drug therapy: Secondary | ICD-10-CM | POA: Diagnosis not present

## 2022-10-01 DIAGNOSIS — I4510 Unspecified right bundle-branch block: Secondary | ICD-10-CM | POA: Diagnosis not present

## 2022-10-01 DIAGNOSIS — F17210 Nicotine dependence, cigarettes, uncomplicated: Secondary | ICD-10-CM | POA: Diagnosis not present

## 2022-10-01 DIAGNOSIS — Z87891 Personal history of nicotine dependence: Secondary | ICD-10-CM | POA: Diagnosis not present

## 2022-10-01 DIAGNOSIS — M545 Low back pain, unspecified: Secondary | ICD-10-CM | POA: Diagnosis not present

## 2022-10-03 DIAGNOSIS — Z79899 Other long term (current) drug therapy: Secondary | ICD-10-CM | POA: Diagnosis not present

## 2022-10-16 ENCOUNTER — Encounter (HOSPITAL_COMMUNITY): Payer: Self-pay | Admitting: Nurse Practitioner

## 2022-10-17 DIAGNOSIS — J449 Chronic obstructive pulmonary disease, unspecified: Secondary | ICD-10-CM | POA: Diagnosis not present

## 2022-10-17 DIAGNOSIS — K861 Other chronic pancreatitis: Secondary | ICD-10-CM | POA: Diagnosis not present

## 2022-10-17 DIAGNOSIS — M069 Rheumatoid arthritis, unspecified: Secondary | ICD-10-CM | POA: Diagnosis not present

## 2022-10-17 DIAGNOSIS — Z48815 Encounter for surgical aftercare following surgery on the digestive system: Secondary | ICD-10-CM | POA: Diagnosis not present

## 2022-10-17 DIAGNOSIS — K219 Gastro-esophageal reflux disease without esophagitis: Secondary | ICD-10-CM | POA: Diagnosis not present

## 2022-10-17 DIAGNOSIS — Z433 Encounter for attention to colostomy: Secondary | ICD-10-CM | POA: Diagnosis not present

## 2022-10-17 DIAGNOSIS — J9811 Atelectasis: Secondary | ICD-10-CM | POA: Diagnosis not present

## 2022-10-17 DIAGNOSIS — D649 Anemia, unspecified: Secondary | ICD-10-CM | POA: Diagnosis not present

## 2022-10-17 DIAGNOSIS — Z4801 Encounter for change or removal of surgical wound dressing: Secondary | ICD-10-CM | POA: Diagnosis not present

## 2022-11-06 DIAGNOSIS — Z681 Body mass index (BMI) 19 or less, adult: Secondary | ICD-10-CM | POA: Diagnosis not present

## 2022-11-06 DIAGNOSIS — M0579 Rheumatoid arthritis with rheumatoid factor of multiple sites without organ or systems involvement: Secondary | ICD-10-CM | POA: Diagnosis not present

## 2022-11-06 DIAGNOSIS — Z79899 Other long term (current) drug therapy: Secondary | ICD-10-CM | POA: Diagnosis not present

## 2022-11-06 DIAGNOSIS — M25521 Pain in right elbow: Secondary | ICD-10-CM | POA: Diagnosis not present

## 2022-11-18 DIAGNOSIS — K631 Perforation of intestine (nontraumatic): Secondary | ICD-10-CM | POA: Diagnosis not present

## 2022-11-18 DIAGNOSIS — Z933 Colostomy status: Secondary | ICD-10-CM | POA: Diagnosis not present

## 2023-04-23 ENCOUNTER — Other Ambulatory Visit (HOSPITAL_COMMUNITY): Payer: Self-pay | Admitting: General Surgery

## 2023-04-23 DIAGNOSIS — K432 Incisional hernia without obstruction or gangrene: Secondary | ICD-10-CM

## 2023-05-13 ENCOUNTER — Ambulatory Visit (HOSPITAL_COMMUNITY)
Admission: RE | Admit: 2023-05-13 | Discharge: 2023-05-13 | Disposition: A | Discharge status: Home / Self Care | Payer: Medicare HMO | Source: Ambulatory Visit | Attending: General Surgery | Admitting: General Surgery

## 2023-05-13 DIAGNOSIS — K432 Incisional hernia without obstruction or gangrene: Secondary | ICD-10-CM | POA: Insufficient documentation

## 2024-02-27 ENCOUNTER — Other Ambulatory Visit (HOSPITAL_COMMUNITY): Payer: Self-pay | Admitting: General Surgery

## 2024-02-27 DIAGNOSIS — K56699 Other intestinal obstruction unspecified as to partial versus complete obstruction: Secondary | ICD-10-CM

## 2024-03-05 ENCOUNTER — Ambulatory Visit (HOSPITAL_COMMUNITY)
Admission: RE | Admit: 2024-03-05 | Discharge: 2024-03-05 | Disposition: A | Discharge status: Home / Self Care | Source: Ambulatory Visit | Attending: General Surgery | Admitting: General Surgery

## 2024-03-05 DIAGNOSIS — K56699 Other intestinal obstruction unspecified as to partial versus complete obstruction: Secondary | ICD-10-CM | POA: Insufficient documentation

## 2024-03-05 MED ORDER — IOHEXOL 300 MG/ML  SOLN
200.0000 mL | Freq: Once | INTRAMUSCULAR | Status: AC | PRN
  Administered 2024-03-05: 200 mL

## 2024-10-15 ENCOUNTER — Emergency Department (HOSPITAL_BASED_OUTPATIENT_CLINIC_OR_DEPARTMENT_OTHER)

## 2024-10-15 ENCOUNTER — Emergency Department (HOSPITAL_BASED_OUTPATIENT_CLINIC_OR_DEPARTMENT_OTHER): Admission: EM | Admit: 2024-10-15 | Discharge: 2024-10-15 | Disposition: A | Discharge status: Home / Self Care

## 2024-10-15 ENCOUNTER — Encounter (HOSPITAL_BASED_OUTPATIENT_CLINIC_OR_DEPARTMENT_OTHER): Payer: Self-pay | Admitting: Emergency Medicine

## 2024-10-15 ENCOUNTER — Other Ambulatory Visit: Payer: Self-pay

## 2024-10-15 DIAGNOSIS — N200 Calculus of kidney: Secondary | ICD-10-CM | POA: Diagnosis not present

## 2024-10-15 DIAGNOSIS — J449 Chronic obstructive pulmonary disease, unspecified: Secondary | ICD-10-CM | POA: Diagnosis not present

## 2024-10-15 DIAGNOSIS — R319 Hematuria, unspecified: Secondary | ICD-10-CM | POA: Diagnosis present

## 2024-10-15 DIAGNOSIS — R310 Gross hematuria: Secondary | ICD-10-CM

## 2024-10-15 LAB — COMPREHENSIVE METABOLIC PANEL WITH GFR
ALT: 11 U/L (ref 0–44)
AST: 21 U/L (ref 15–41)
Albumin: 3.9 g/dL (ref 3.5–5.0)
Alkaline Phosphatase: 105 U/L (ref 38–126)
Anion gap: 12 (ref 5–15)
BUN: 22 mg/dL (ref 8–23)
CO2: 24 mmol/L (ref 22–32)
Calcium: 9.1 mg/dL (ref 8.9–10.3)
Chloride: 99 mmol/L (ref 98–111)
Creatinine, Ser: 0.77 mg/dL (ref 0.61–1.24)
GFR, Estimated: >60 mL/min (ref >60)
Glucose, Bld: 91 mg/dL (ref 70–99)
Potassium: 3.9 mmol/L (ref 3.5–5.1)
Sodium: 135 mmol/L (ref 135–145)
Total Bilirubin: 0.4 mg/dL (ref 0.0–1.2)
Total Protein: 7.6 g/dL (ref 6.5–8.1)

## 2024-10-15 LAB — URINALYSIS, ROUTINE W REFLEX MICROSCOPIC

## 2024-10-15 LAB — CBC
HCT: 36.6 % — ABNORMAL LOW (ref 39.0–52.0)
Hemoglobin: 11.9 g/dL — ABNORMAL LOW (ref 13.0–17.0)
MCH: 29.3 pg (ref 26.0–34.0)
MCHC: 32.5 g/dL (ref 30.0–36.0)
MCV: 90.1 fL (ref 80.0–100.0)
Platelets: 239 K/uL (ref 150–400)
RBC: 4.06 MIL/uL — ABNORMAL LOW (ref 4.22–5.81)
RDW: 13.4 % (ref 11.5–15.5)
WBC: 11.9 K/uL — ABNORMAL HIGH (ref 4.0–10.5)
nRBC: 0 % (ref 0.0–0.2)

## 2024-10-15 LAB — URINALYSIS, MICROSCOPIC (REFLEX): RBC / HPF: >50 RBC/hpf (ref 0–5)

## 2024-10-15 NOTE — ED Triage Notes (Signed)
 Pt c/o hematuria x 2 hours.   Also c/o lower back pain x 2 weeks.

## 2024-10-15 NOTE — ED Notes (Signed)
 Pt aware of necessity of urine sample.

## 2024-10-15 NOTE — ED Notes (Signed)
 Pt transferred from [facility] to ED RM 9. Assuming pt care at this time.

## 2024-10-15 NOTE — ED Provider Notes (Signed)
  EMERGENCY DEPARTMENT AT [facility] Provider Note   CSN: [id number] Arrival date & time: 10/15/24  1421     Patient presents with: Hematuria and Back Pain   Jerry Knight is a 63 y.o. male.   Patient with history of stercoral ulcer with perforation in September 2023, status post partial colectomy currently with colostomy, history of chronic pancreatitis, GI bleed, COPD, history of cholecystectomy, history of kidney stones status post lithotripsy--presents to the emergency department today for evaluation of hematuria.  Patient states that he has had a bilateral back pain but has been working a lot over the past 1 week.  Yesterday he felt generally unwell, checked his temperature a few times.  Today he urinated and noticed that he was urinating blood into the toilet.  No associated pain.  Urine was dark red in color.  He denies associated fevers, vomiting, change in ostomy output.  Patient is not on any anticoagulation or aspirin.  Denies other bleeding symptoms including in the stool.  Patient thought that his back was hurting as he has been working several hours at a time on his feet.       Prior to Admission medications   Medication Sig Start Date End Date Taking? Authorizing Provider  acetaminophen  (TYLENOL ) 500 MG tablet Take 2 tablets (1,000 mg total) by mouth 3 (three) times daily. 09/11/22   Augustus Almarie RAMAN, PA-C  amitriptyline  (ELAVIL ) 50 MG tablet Take 100 mg by mouth at bedtime.    [provider]  atorvastatin  (LIPITOR) 20 MG tablet Take 1 tablet (20 mg total) by mouth daily. 02/19/21   Just, Kelsea J, FNP  cyclobenzaprine  (FLEXERIL ) 10 MG tablet Take 1 tablet (10 mg total) by mouth 3 (three) times daily. 02/19/21   Just, Kelsea J, FNP  etanercept (ENBREL) 50 MG/ML injection Inject 50 mg into the skin once a week.    [provider]  folic acid  (FOLVITE ) 1 MG tablet Take 1 mg by mouth daily. 08/06/22   [provider]   linaclotide  (LINZESS ) 145 MCG CAPS capsule Take 1 capsule (145 mcg total) by mouth daily before breakfast. 02/19/21   Just, Kelsea J, FNP  LORazepam  (ATIVAN ) 1 MG tablet Take 1 mg by mouth 3 (three) times daily. 07/18/22   [provider]  magnesium  hydroxide (MILK OF MAGNESIA) 400 MG/5ML suspension Take 30 mLs by mouth every 12 (twelve) hours as needed for mild constipation, moderate constipation, heartburn or indigestion. 09/11/22   Augustus Almarie RAMAN, PA-C  methotrexate (RHEUMATREX) 2.5 MG tablet Take 10 mg by mouth once a week. 06/08/22   [provider]  mirtazapine  (REMERON ) 45 MG tablet Take 1 tablet (45 mg total) by mouth at bedtime. 02/19/21   Just, Kelsea J, FNP  omeprazole  (PRILOSEC) 20 MG capsule TAKE 1 CAPSULE(20 MG) BY MOUTH DAILY 02/19/21   Just, Kelsea J, FNP  oxyCODONE  (OXY IR/ROXICODONE ) 5 MG immediate release tablet Take 2 tablets (10 mg total) by mouth every 6 (six) hours as needed for breakthrough pain (breakthrough pain). 09/11/22   Augustus Almarie RAMAN, PA-C  oxyCODONE  (OXYCONTIN ) 20 mg 12 hr tablet Take 1 tablet (20 mg total) by mouth every 12 (twelve) hours. 09/11/22   Augustus Almarie RAMAN, PA-C  polyethylene glycol (MIRALAX  / GLYCOLAX ) 17 g packet Take 34 g by mouth 2 (two) times daily. 09/11/22   Augustus Almarie RAMAN, PA-C  traZODone  (DESYREL ) 50 MG tablet Take 3-4 tablets (150-200 mg total) by mouth at bedtime. 02/19/21   Just,  Kelsea J, FNP    Allergies: Ambien [zolpidem tartrate], Iohexol , Lunesta [eszopiclone], Seroquel [quetiapine fumarate], and Sonata [zaleplon]    Review of Systems  Updated Vital Signs BP 98/76 (BP Location: Right Arm)   Pulse 96   Temp 98.2 F (36.8 C) (Oral)   Resp 18   Ht 5' 5.5 (1.664 m)   Wt 52.2 kg   SpO2 96%   BMI 18.85 kg/m   Physical Exam Vitals and nursing note reviewed.  Constitutional:      General: He is not in acute distress.    Appearance: He is well-developed.  HENT:     Head: Normocephalic and  atraumatic.     Right Ear: External ear normal.     Nose: Nose normal.     Mouth/Throat:     Mouth: Mucous membranes are moist.  Eyes:     General:        Right eye: No discharge.        Left eye: No discharge.     Conjunctiva/sclera: Conjunctivae normal.  Cardiovascular:     Rate and Rhythm: Normal rate and regular rhythm.     Heart sounds: Normal heart sounds.     Comments: Heart sounds difficult to hear in the left upper sternal border.  Pulmonary:     Effort: Pulmonary effort is normal.     Breath sounds: Normal breath sounds.  Abdominal:     Palpations: Abdomen is soft.     Tenderness: There is no abdominal tenderness. There is no guarding or rebound.     Comments: Colostomy noted left lower quadrant, healed surgical scarring  Musculoskeletal:     Cervical back: Normal range of motion and neck supple.  Skin:    General: Skin is warm and dry.  Neurological:     Mental Status: He is alert.     (all labs ordered are listed, but only abnormal results are displayed) Labs Reviewed  URINALYSIS, ROUTINE W REFLEX MICROSCOPIC - Abnormal; Notable for the following components:      Result Value   Color, Urine BROWN (*)    APPearance TURBID (*)    Glucose, UA   (*)    Value: TEST NOT REPORTED DUE TO COLOR INTERFERENCE OF URINE PIGMENT   Hgb urine dipstick   (*)    Value: TEST NOT REPORTED DUE TO COLOR INTERFERENCE OF URINE PIGMENT   Bilirubin Urine   (*)    Value: TEST NOT REPORTED DUE TO COLOR INTERFERENCE OF URINE PIGMENT   Ketones, ur   (*)    Value: TEST NOT REPORTED DUE TO COLOR INTERFERENCE OF URINE PIGMENT   Protein, ur   (*)    Value: TEST NOT REPORTED DUE TO COLOR INTERFERENCE OF URINE PIGMENT   Nitrite   (*)    Value: TEST NOT REPORTED DUE TO COLOR INTERFERENCE OF URINE PIGMENT   Leukocytes,Ua   (*)    Value: TEST NOT REPORTED DUE TO COLOR INTERFERENCE OF URINE PIGMENT   All other components within normal limits  CBC - Abnormal; Notable for the following  components:   WBC 11.9 (*)    RBC 4.06 (*)    Hemoglobin 11.9 (*)    HCT 36.6 (*)    All other components within normal limits  URINALYSIS, MICROSCOPIC (REFLEX) - Abnormal; Notable for the following components:   Bacteria, UA FEW (*)    All other components within normal limits  COMPREHENSIVE METABOLIC PANEL WITH GFR    EKG: None  Radiology: CT  Renal Stone Study Result Date: 10/15/2024 CLINICAL DATA:  Hematuria and lower back pain. EXAM: CT ABDOMEN AND PELVIS WITHOUT CONTRAST TECHNIQUE: Multidetector CT imaging of the abdomen and pelvis was performed following the standard protocol without IV contrast. RADIATION DOSE REDUCTION: This exam was performed according to the departmental dose-optimization program which includes automated exposure control, adjustment of the mA and/or kV according to patient size and/or use of iterative reconstruction technique. COMPARISON:  None Available. FINDINGS: Lower chest: A stable, likely benign 9 mm pulmonary nodule is seen within the posterolateral aspect of the right lung base (stability dates back to 2018). Hepatobiliary: No focal liver abnormality is seen. No gallstones, gallbladder wall thickening, or biliary dilatation. Pancreas: Diffuse pancreatic atrophy is noted. No pancreatic ductal dilatation or surrounding inflammatory changes. Spleen: Normal in size without focal abnormality. Adrenals/Urinary Tract: Adrenal glands are unremarkable. Kidneys are normal in size, without renal calculi or hydronephrosis. 4 mm and 5 mm nonobstructing renal calculi are seen within the dependent portion of a mildly dilated left renal pelvis. No obstructing renal calculi are identified. A 6 mm parenchymal calcification and adjacent cortical scarring are seen along the lateral aspect of the mid right kidney. Bladder is unremarkable. Stomach/Bowel: Surgical sutures are seen throughout the gastric region. Surgically anastomosed bowel is seen within the mid left abdomen. The  appendix is surgically absent. A large stool burden is noted. No evidence of bowel wall thickening, distention, or inflammatory changes. Vascular/Lymphatic: Aortic atherosclerosis. No enlarged abdominal or pelvic lymph nodes. Reproductive: Prostate is unremarkable. Other: There is a left lower quadrant ostomy site. Stable, postoperative rectus muscle diastasis is seen. No abdominopelvic ascites. Musculoskeletal: No acute or significant osseous findings. IMPRESSION: 1. 4 mm and 5 mm nonobstructing renal calculi within the dependent portion of a mildly dilated left renal pelvis. 2. Large stool burden without evidence of bowel obstruction. 3. Aortic atherosclerosis. Electronically Signed   By: Suzen Dials M.D.   On: 10/15/2024 15:56     Procedures   Medications Ordered in the ED - No data to display  ED Course  Patient seen and examined. History obtained directly from patient and spouse at bedside, reviewed previous surgical notes.  Labs/EKG: Ordered CBC, CMP, UA.  Imaging: Ordered CT renal protocol.  Medications/Fluids: None ordered  Most recent vital signs reviewed and are as follows: BP 98/76 (BP Location: Right Arm)   Pulse 96   Temp 98.2 F (36.8 C) (Oral)   Resp 18   Ht 5' 5.5 (1.664 m)   Wt 52.2 kg   SpO2 96%   BMI 18.85 kg/m   Initial impression: Gross hematuria, question of kidney stone given history, but no severe pain or vomiting accompanying this.  Will check lab work.  Patient with generalized malaise but normal temperature, pulse rate.  4:27 PM Reassessment performed. Patient appears stable, comfortable.  Labs personally reviewed and interpreted including: CBC with white blood cell count 11.9, hemoglobin 11.9; CMP unremarkable; UA with blood precluding dipstick, red cells noted on microscopy, small amount of white cells, not overly compelling for infection at this time.  Imaging personally visualized and interpreted including: CT renal protocol showing 2 stones  in the renal pelvis, nonobstructive at this time.  No other masses or obvious causes for bleeding.  Reviewed pertinent lab work and imaging with patient and spouse at bedside. Questions answered.   Most current vital signs reviewed and are as follows: BP 112/65   Pulse 89   Temp 98.2 F (36.8 C) (Oral)   Resp  16   Ht 5' 5.5 (1.664 m)   Wt 52.2 kg   SpO2 96%   BMI 18.85 kg/m   Plan: Discharge to home.   Prescriptions written for: None  Other home care instructions discussed: Maintain good hydration, activity as tolerated, not overdoing it.  ED return instructions discussed: Return if he develops heavier bleeding or symptoms of anemia including lightheadedness or shortness of breath.  We discussed minimize strenuous activities including heavy lifting, bending pushing, or pulling.  Discussed need to return or to go to [facility] ED with symptoms of ureteral colic including severe back pain, vomiting.  Follow-up instructions discussed: Patient encouraged to follow-up with their urologist in 1 week.                                  Medical Decision Making Amount and/or Complexity of Data Reviewed Labs: ordered. Radiology: ordered.   Well-appearing patient presenting with gross hematuria.  No anticoagulation.  Patient has had some mild back pain, question musculoskeletal.  Symptoms are not classic for ureteral colic.  Patient was evaluated today.  Hemoglobin was minimally low at 11.9.  His symptomatology is not concerning for significant anemia or profound blood loss at this time.  Vital signs are reassuring.  Otherwise asymptomatic.  No evidence of UTI or pyelonephritis.  CT imaging shows 2 stones in the renal pelvis, nonobstructive.  This may be the cause of the patient's bleeding.  No other obvious lesions or masses noted.  At this time, feel that patient to be safely discharged.  He seems reliable to come back if symptoms worsen.  He plans to see a urologist with [facility] or at the  [facility].  I have also provided a referral to [facility].  Strongly encouraged follow-up to determine if any measures need to be taken for the stones, or for possible other evaluation for hematuria.  The patient's vital signs, pertinent lab work and imaging were reviewed and interpreted as discussed in the ED course. Hospitalization was considered for further testing, treatments, or serial exams/observation. However as patient is well-appearing, has a stable exam, and reassuring studies today, I do not feel that they warrant admission at this time. This plan was discussed with the patient who verbalizes agreement and comfort with this plan and seems reliable and able to return to the Emergency Department with worsening or changing symptoms.       Final diagnoses:  Gross hematuria  Renal stones    ED Discharge Orders     None          Desiderio Chew, PA-C 10/15/24 1632    Kammerer, Megan L, DO 10/19/24 [phone]

## 2024-10-15 NOTE — Discharge Instructions (Addendum)
 Please read and follow all provided instructions.  Your diagnoses today include:  1. Gross hematuria   2. Renal stones     Tests performed today include: Complete blood cell count: Slightly high white blood cell count and just slightly low red blood cell count Complete metabolic panel: Normal kidney function Urinalysis (urine test): Shows blood, do not suspect urine infection CT scan of the abdomen and pelvis: You have 2 kidney stones that are sitting just inside of the kidney on the left side, this is most likely the cause of your bleeding.  The stents have not yet moved into the ureter which would cause more typical kidney stone pain.  Your scan also shows constipation. Vital signs. See below for your results today.   Medications prescribed:  None Take any prescribed medications only as directed.  Home care instructions:  Follow any educational materials contained in this packet.  Follow-up instructions: Please follow-up with your urologist in the next 1 week.  Return instructions:  Please return to the Emergency Department if you experience worsening symptoms.  Please return if you developed severe pain in your abdomen or back, lightheadedness or syncope, shortness of breath, heavier amounts of bleeding in the urine Please return if you have any other emergent concerns.  Additional Information:  Your vital signs today were: BP 98/70 (BP Location: Right Arm)   Pulse 87   Temp 98.2 F (36.8 C) (Oral)   Resp 16   Ht 5' 5.5 (1.664 m)   Wt 52.2 kg   SpO2 95%   BMI 18.85 kg/m  If your blood pressure (BP) was elevated above 135/85 this visit, please have this repeated by your doctor within one month. --------------

## 2024-11-02 ENCOUNTER — Other Ambulatory Visit: Payer: Self-pay | Admitting: Urology

## 2024-11-05 NOTE — Progress Notes (Addendum)
 Date of COVID positive in last 90 days:  PCP - Mauro Sharps, PA Cardiologist - saw in 2013 for cp- not cardiac per pt  Chest x-ray - N/A  EKG - n/a Stress Test - N/A ECHO - N/A Cardiac Cath - N/A Pacemaker/ICD device last checked:N/A Spinal Cord Stimulator:N/A  Bowel Prep - N/A  Sleep Study - N/A CPAP -   Fasting Blood Sugar - N/A Checks Blood Sugar _____ times a day  Last dose of GLP1 agonist-  N/A GLP1 instructions:  Do not take after     Last dose of SGLT-2 inhibitors-  N/A SGLT-2 instructions:  Do not take after     Blood Thinner Instructions: N/A Last dose:   Time: Aspirin Instructions:N/A Last Dose:  Activity level: Can go up a flight of stairs and perform activities of daily living without stopping and without symptoms of chest pain or shortness of breath.   Anesthesia review: COPD,seizures, pancreatitis  Patient denies shortness of breath, fever, cough and chest pain at PAT appointment  Patient verbalized understanding of instructions that were given to them at the PAT appointment. Patient was also instructed that they will need to review over the PAT instructions again at home before surgery.

## 2024-11-09 ENCOUNTER — Encounter (HOSPITAL_COMMUNITY): Payer: Self-pay

## 2024-11-09 ENCOUNTER — Other Ambulatory Visit: Payer: Self-pay

## 2024-11-09 ENCOUNTER — Encounter (HOSPITAL_COMMUNITY)
Admission: RE | Admit: 2024-11-09 | Discharge: 2024-11-09 | Disposition: A | Discharge status: Home / Self Care | Source: Ambulatory Visit | Attending: Urology | Admitting: Urology

## 2024-11-09 HISTORY — DX: Gastro-esophageal reflux disease without esophagitis: K21.9

## 2024-11-09 HISTORY — DX: Unspecified osteoarthritis, unspecified site: M19.90

## 2024-11-09 HISTORY — DX: Personal history of urinary calculi: Z87.442

## 2024-11-09 NOTE — Patient Instructions (Addendum)
 SURGICAL WAITING ROOM VISITATION  Patients having surgery or a procedure may have no more than 2 support people in the waiting area - these visitors may rotate.    Children ages 72 and under will not be able to visit patients in [facility] under most circumstances.   Visitors with respiratory illnesses are discouraged from visiting and should remain at home.  If the patient needs to stay at the hospital during part of their recovery, the visitor guidelines for inpatient rooms apply. Pre-op nurse will coordinate an appropriate time for 1 support person to accompany patient in pre-op.  This support person may not rotate.    Please refer to the [facility] website for the visitor guidelines for Inpatients (after your surgery is over and you are in a regular room).    Your procedure is scheduled on: 11/10/24   Report to [facility] Main Entrance    Report to admitting at 11:45 AM   Call this number if you have problems the morning of surgery [phone]   Do not eat food or drink liquids :After Midnight.          If you have questions, please contact your surgeon's office.   FOLLOW BOWEL PREP AND ANY ADDITIONAL PRE OP INSTRUCTIONS YOU RECEIVED FROM YOUR SURGEON'S OFFICE!!!     Oral Hygiene is also important to reduce your risk of infection.                                    Remember - BRUSH YOUR TEETH THE MORNING OF SURGERY WITH YOUR REGULAR TOOTHPASTE  DENTURES WILL BE REMOVED PRIOR TO SURGERY PLEASE DO NOT APPLY Poly grip OR ADHESIVES!!!   Do NOT smoke after Midnight   Stop all vitamins and herbal supplements 7 days before surgery.   Take these medicines the morning of surgery with A SIP OF WATER : Alprazolam                               You may not have any metal on your body including jewelry, and body piercing             Do not wear lotions, powders, cologne, or deodorant              Men may shave face and neck.   Do not bring valuables to the  hospital. [facility] IS NOT             RESPONSIBLE   FOR VALUABLES.   Contacts, glasses, dentures or bridgework may not be worn into surgery.  DO NOT BRING YOUR HOME MEDICATIONS TO THE HOSPITAL. PHARMACY WILL DISPENSE MEDICATIONS LISTED ON YOUR MEDICATION LIST TO YOU DURING YOUR ADMISSION IN THE HOSPITAL!    Patients discharged on the day of surgery will not be allowed to drive home.  Someone NEEDS to stay with you for the first 24 hours after anesthesia.   Special Instructions: Bring a copy of your healthcare power of attorney and living will documents the day of surgery if you haven't scanned them before.              Please read over the following fact sheets you were given: IF YOU HAVE QUESTIONS ABOUT YOUR PRE-OP INSTRUCTIONS PLEASE CALL [phone].   If you received a COVID test during your pre-op visit  it is requested  that you wear a mask when out in public, stay away from anyone that may not be feeling well and notify your surgeon if you develop symptoms. If you test positive for Covid or have been in contact with anyone that has tested positive in the last 10 days please notify you surgeon.    [facility] - Preparing for Surgery Before surgery, you can play an important role.  Because skin is not sterile, your skin needs to be as free of germs as possible.  You can reduce the number of germs on your skin by washing with CHG (chlorahexidine gluconate) soap before surgery.  CHG is an antiseptic cleaner which kills germs and bonds with the skin to continue killing germs even after washing. Please DO NOT use if you have an allergy to CHG or antibacterial soaps.  If your skin becomes reddened/irritated stop using the CHG and inform your nurse when you arrive at Short Stay. Do not shave (including legs and underarms) for at least 48 hours prior to the first CHG shower.  You may shave your face/neck.  Please follow these instructions carefully:  1.  Shower with CHG Soap the night  before surgery and the morning of surgery.  2.  If you choose to wash your hair, wash your hair first as usual with your normal  shampoo.  3.  After you shampoo, rinse your hair and body thoroughly to remove the shampoo.                             4.  Use CHG as you would any other liquid soap.  You can apply chg directly to the skin and wash.  Gently with a scrungie or clean washcloth.  5.  Apply the CHG Soap to your body ONLY FROM THE NECK DOWN.   Do   not use on face/ open                           Wound or open sores. Avoid contact with eyes, ears mouth and   genitals (private parts).                       Wash face,  Genitals (private parts) with your normal soap.             6.  Wash thoroughly, paying special attention to the area where your    surgery  will be performed.  7.  Thoroughly rinse your body with warm water  from the neck down.  8.  DO NOT shower/wash with your normal soap after using and rinsing off the CHG Soap.                9.  Pat yourself dry with a clean towel.            10.  Wear clean pajamas.            11.  Place clean sheets on your bed the night of your first shower and do not  sleep with pets. Day of Surgery : Do not apply any CHG, lotions/deodorants the morning of surgery.  Please wear clean clothes to the hospital/surgery center.  FAILURE TO FOLLOW THESE INSTRUCTIONS MAY RESULT IN THE CANCELLATION OF YOUR SURGERY  PATIENT SIGNATURE_________________________________  NURSE SIGNATURE__________________________________  ________________________________________________________________________

## 2024-11-09 NOTE — Anesthesia Preprocedure Evaluation (Addendum)
 Anesthesia Evaluation    Airway        Dental   Pulmonary Current Smoker and Patient abstained from smoking.          Cardiovascular      Neuro/Psych    GI/Hepatic   Endo/Other    Renal/GU      Musculoskeletal   Abdominal   Peds  Hematology   Anesthesia Other Findings   Reproductive/Obstetrics                              Anesthesia Physical Anesthesia Plan  ASA:   Anesthesia Plan:    Post-op Pain Management:    Induction:   PONV Risk Score and Plan:   Airway Management Planned:   Additional Equipment:   Intra-op Plan:   Post-operative Plan:   Informed Consent:   Plan Discussed with:   Anesthesia Plan Comments: (See PAT note from 12/9)         Anesthesia Quick Evaluation

## 2024-11-09 NOTE — Progress Notes (Signed)
 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  Hx of current smoking and COPD. Does not use inhalers.   Hx of seizures in chart. He does not see neurology and is not on AEDs. ? Remote from ETOH abuse?  He has a significant GI history and still has a colostomy.   Hx of chronic pain and on Suboxone .  Hx of RA and follows with Rheumatology. On Enbrel, sulfasalazine, methotrexate  At PAT visit patient reports he can do stairs without CP/SOB  VS: BP 111/76   Pulse 80   Temp 36.6 C (Oral)   Resp 16   Ht 5' 5.5 (1.664 m)   Wt 52.2 kg   SpO2 98%   BMI 18.85 kg/m   PROVIDERS: Elizbeth Leita Ruth, FNP (Inactive)   LABS: Labs reviewed: Acceptable for surgery. (all labs ordered are listed, but only abnormal results are displayed)  Labs Reviewed - No data to display   CT Renal 10/15/24:  IMPRESSION: 1. 4 mm and 5 mm nonobstructing renal calculi within the dependent portion of a mildly dilated left renal pelvis. 2. Large stool burden without evidence of bowel obstruction. 3. Aortic atherosclerosis.    EKG:   CV:  Past Medical History:  Diagnosis Date   Arthritis    RA   Blood transfusion without reported diagnosis    Chest pain    Chronic neck pain    Chronic pain    Chronic pancreatitis  (HCC)    COPD (chronic obstructive pulmonary disease) (HCC)    Degenerative disk disease    C 5 &C 6   Depression    GERD (gastroesophageal reflux disease)    GI bleed    Gout    History of kidney stones    Migraine    Migraine headache    Pain management contract agreement    Seizure disorder (HCC)    x 7 years   Seizures (HCC)    Substance abuse (HCC)    Has been cleaned for 18 mos   Zollinger-Ellison syndrome     Past Surgical History:  Procedure Laterality Date   APPENDECTOMY     per CT 2012- s/p appendectomy   CHOLECYSTECTOMY     EYE SURGERY     HERNIA REPAIR     IR RADIOLOGIST EVAL & MGMT  09/26/2022   IR SINUS/FIST TUBE CHK-NON GI  09/09/2022   LAPAROTOMY N/A 08/16/2022   Procedure: EXPLORATORY LAPAROTOMY;  Surgeon: Rubin Calamity, MD;  Location: [facility] OR;  Service: General;  Laterality: N/A;   OSTOMY Left 08/16/2022   Procedure: COLOSTOMY;  Surgeon: Rubin Calamity, MD;  Location: [facility] OR;  Service: General;  Laterality: Left;   PARTIAL COLECTOMY N/A 08/16/2022   Procedure: PARTIAL SIGMOID COLECTOMY;  Surgeon: Rubin Calamity, MD;  Location: [facility] OR;  Service: General;  Laterality: N/A;   PARTIAL GASTRECTOMY     SMALL INTESTINE SURGERY  MEDICATIONS:  amitriptyline  (ELAVIL ) 50 MG tablet   atorvastatin  (LIPITOR) 20 MG tablet   buprenorphine -naloxone  (SUBOXONE ) 2-0.5 mg SUBL SL tablet   cyclobenzaprine  (FLEXERIL ) 10 MG tablet   etanercept (ENBREL) 50 MG/ML injection   LORazepam  (ATIVAN ) 1 MG tablet   Melatonin 10 MG CHEW   naproxen sodium (ALEVE) 220 MG tablet   omeprazole  (PRILOSEC) 20 MG capsule   sulfaSALAzine (AZULFIDINE) 500 MG tablet   No current facility-administered medications for this encounter.   [name] [name] [name] [name] [facility] Short Stay/Anesthesiology [facility] Phone [phone] 11/09/2024 2:08 PM

## 2024-11-10 ENCOUNTER — Ambulatory Visit (HOSPITAL_COMMUNITY): Admitting: Anesthesiology

## 2024-11-10 ENCOUNTER — Ambulatory Visit (HOSPITAL_COMMUNITY)

## 2024-11-10 ENCOUNTER — Ambulatory Visit (HOSPITAL_COMMUNITY): Admission: RE | Admit: 2024-11-10 | Discharge: 2024-11-10 | Disposition: A | Attending: Urology | Admitting: Urology

## 2024-11-10 ENCOUNTER — Encounter (HOSPITAL_COMMUNITY): Payer: Self-pay | Admitting: Medical

## 2024-11-10 ENCOUNTER — Encounter (HOSPITAL_COMMUNITY): Payer: Self-pay | Admitting: Urology

## 2024-11-10 ENCOUNTER — Encounter: Admission: RE | Disposition: A | Payer: Self-pay | Attending: Urology

## 2024-11-10 DIAGNOSIS — F419 Anxiety disorder, unspecified: Secondary | ICD-10-CM

## 2024-11-10 DIAGNOSIS — N201 Calculus of ureter: Secondary | ICD-10-CM

## 2024-11-10 DIAGNOSIS — J449 Chronic obstructive pulmonary disease, unspecified: Secondary | ICD-10-CM

## 2024-11-10 DIAGNOSIS — F17200 Nicotine dependence, unspecified, uncomplicated: Secondary | ICD-10-CM | POA: Diagnosis not present

## 2024-11-10 HISTORY — PX: CYSTOSCOPY/URETEROSCOPY/HOLMIUM LASER/STENT PLACEMENT: SHX6546

## 2024-11-10 HISTORY — PX: CYSTOSCOPY WITH BIOPSY: SHX5122

## 2024-11-10 SURGERY — CYSTOSCOPY/URETEROSCOPY/HOLMIUM LASER/STENT PLACEMENT
Anesthesia: General | Laterality: Left

## 2024-11-10 MED ORDER — 0.9 % SODIUM CHLORIDE (POUR BTL) OPTIME
TOPICAL | Status: DC | PRN
  Administered 2024-11-10: 1000 mL

## 2024-11-10 MED ORDER — FENTANYL CITRATE (PF) 100 MCG/2ML IJ SOLN
INTRAMUSCULAR | Status: AC
  Filled 2024-11-10: qty 2

## 2024-11-10 MED ORDER — PROMETHAZINE (PHENERGAN) 6.25MG IN NS 50ML IVPB
6.2500 mg | Freq: Once | INTRAVENOUS | Status: AC
  Administered 2024-11-10: 6.25 mg via INTRAVENOUS
  Filled 2024-11-10: qty 6.25

## 2024-11-10 MED ORDER — LACTATED RINGERS IV SOLN: INTRAVENOUS | Status: DC

## 2024-11-10 MED ORDER — DROPERIDOL 2.5 MG/ML IJ SOLN
0.6250 mg | Freq: Once | INTRAMUSCULAR | Status: AC | PRN
  Administered 2024-11-10: 0.625 mg via INTRAVENOUS

## 2024-11-10 MED ORDER — LIDOCAINE HCL (PF) 2 % IJ SOLN
INTRAMUSCULAR | Status: AC
  Filled 2024-11-10: qty 5

## 2024-11-10 MED ORDER — ONDANSETRON HCL 4 MG/2ML IJ SOLN
INTRAMUSCULAR | Status: DC | PRN
  Administered 2024-11-10: 4 mg via INTRAVENOUS

## 2024-11-10 MED ORDER — DEXAMETHASONE SOD PHOSPHATE PF 10 MG/ML IJ SOLN
INTRAMUSCULAR | Status: DC | PRN
  Administered 2024-11-10: 5 mg via INTRAVENOUS

## 2024-11-10 MED ORDER — CHLORHEXIDINE GLUCONATE 0.12 % MT SOLN
15.0000 mL | Freq: Once | OROMUCOSAL | Status: AC
  Administered 2024-11-10: 15 mL via OROMUCOSAL

## 2024-11-10 MED ORDER — LIDOCAINE HCL (CARDIAC) PF 100 MG/5ML IV SOSY
PREFILLED_SYRINGE | INTRAVENOUS | Status: DC | PRN
  Administered 2024-11-10: 80 mg via INTRAVENOUS

## 2024-11-10 MED ORDER — PHENYLEPHRINE HCL (PRESSORS) 10 MG/ML IV SOLN
INTRAVENOUS | Status: DC | PRN
  Administered 2024-11-10: 160 ug via INTRAVENOUS

## 2024-11-10 MED ORDER — OXYBUTYNIN CHLORIDE 5 MG PO TABS: 5.0000 mg | ORAL_TABLET | Freq: Three times a day (TID) | ORAL | 1 refills | Status: AC | PRN

## 2024-11-10 MED ORDER — FENTANYL CITRATE (PF) 100 MCG/2ML IJ SOLN
INTRAMUSCULAR | Status: DC | PRN
  Administered 2024-11-10 (×2): 50 ug via INTRAVENOUS

## 2024-11-10 MED ORDER — FENTANYL CITRATE (PF) 50 MCG/ML IJ SOSY
25.0000 ug | PREFILLED_SYRINGE | INTRAMUSCULAR | Status: DC | PRN
  Administered 2024-11-10 (×2): 50 ug via INTRAVENOUS

## 2024-11-10 MED ORDER — CEFAZOLIN SODIUM-DEXTROSE 2-4 GM/100ML-% IV SOLN
2.0000 g | INTRAVENOUS | Status: AC
  Administered 2024-11-10: 2 g via INTRAVENOUS
  Filled 2024-11-10: qty 100

## 2024-11-10 MED ORDER — MIDAZOLAM HCL 2 MG/2ML IJ SOLN
INTRAMUSCULAR | Status: AC
  Filled 2024-11-10: qty 2

## 2024-11-10 MED ORDER — PROPOFOL 10 MG/ML IV BOLUS
INTRAVENOUS | Status: AC
  Filled 2024-11-10: qty 20

## 2024-11-10 MED ORDER — DROPERIDOL 2.5 MG/ML IJ SOLN
INTRAMUSCULAR | Status: AC
  Filled 2024-11-10: qty 2

## 2024-11-10 MED ORDER — EPHEDRINE SULFATE (PRESSORS) 25 MG/5ML IV SOSY
PREFILLED_SYRINGE | INTRAVENOUS | Status: DC | PRN
  Administered 2024-11-10: 10 mg via INTRAVENOUS

## 2024-11-10 MED ORDER — OXYCODONE-ACETAMINOPHEN 10-325 MG PO TABS: 1.0000 | ORAL_TABLET | Freq: Four times a day (QID) | ORAL | 0 refills | Status: AC | PRN

## 2024-11-10 MED ORDER — ONDANSETRON HCL 4 MG/2ML IJ SOLN
INTRAMUSCULAR | Status: AC
  Filled 2024-11-10: qty 2

## 2024-11-10 MED ORDER — MIDAZOLAM HCL (PF) 2 MG/2ML IJ SOLN
INTRAMUSCULAR | Status: DC | PRN
  Administered 2024-11-10: 1 mg via INTRAVENOUS

## 2024-11-10 MED ORDER — SODIUM CHLORIDE 0.9 % IR SOLN
Status: DC | PRN
  Administered 2024-11-10: 3000 mL via INTRAVESICAL

## 2024-11-10 MED ORDER — PROPOFOL 10 MG/ML IV BOLUS
INTRAVENOUS | Status: DC | PRN
  Administered 2024-11-10: 100 mg via INTRAVENOUS

## 2024-11-10 MED ORDER — PROMETHAZINE HCL 25 MG/ML IJ SOLN
INTRAMUSCULAR | Status: AC
  Filled 2024-11-10: qty 1

## 2024-11-10 MED ORDER — FENTANYL CITRATE (PF) 50 MCG/ML IJ SOSY
PREFILLED_SYRINGE | INTRAMUSCULAR | Status: AC
  Filled 2024-11-10: qty 1

## 2024-11-10 MED ORDER — ORAL CARE MOUTH RINSE: 15.0000 mL | Freq: Once | OROMUCOSAL | Status: AC

## 2024-11-10 MED ORDER — IOHEXOL 300 MG/ML  SOLN
INTRAMUSCULAR | Status: DC | PRN
  Administered 2024-11-10: 25 mL via URETHRAL

## 2024-11-10 SURGICAL SUPPLY — 25 items
BAG URINE DRAIN 2000ML AR STRL (UROLOGICAL SUPPLIES) IMPLANT
BAG URO CATCHER STRL LF (MISCELLANEOUS) ×1 IMPLANT
BASKET ZERO TIP NITINOL 2.4FR (BASKET) IMPLANT
CATH URETERAL DUAL LUMEN 10F (MISCELLANEOUS) IMPLANT
CATH URETL OPEN 5X70 (CATHETERS) ×1 IMPLANT
CLOTH BEACON ORANGE TIMEOUT ST (SAFETY) ×1 IMPLANT
DRAPE FOOT SWITCH (DRAPES) ×1 IMPLANT
ELECT REM PT RETURN 15FT ADLT (MISCELLANEOUS) ×1 IMPLANT
EXTRACTOR STONE NITINOL NGAGE (UROLOGICAL SUPPLIES) IMPLANT
FIBER LASER MOSES 200 DFL (Laser) IMPLANT
GLOVE SURG LX STRL 8.0 MICRO (GLOVE) ×1 IMPLANT
GOWN STRL SURGICAL XL XLNG (GOWN DISPOSABLE) ×1 IMPLANT
GUIDEWIRE STR DUAL SENSOR (WIRE) IMPLANT
GUIDEWIRE ZIPWRE .038 STRAIGHT (WIRE) ×1 IMPLANT
KIT TURNOVER KIT A (KITS) ×1 IMPLANT
LOOP CUT BIPOLAR 24F LRG (ELECTROSURGICAL) IMPLANT
MANIFOLD NEPTUNE II (INSTRUMENTS) ×1 IMPLANT
PACK CYSTO (CUSTOM PROCEDURE TRAY) ×1 IMPLANT
SHEATH NAVIGATOR HD 11/13X28 (SHEATH) IMPLANT
SHEATH NAVIGATOR HD 11/13X36 (SHEATH) IMPLANT
STENT URET 6FRX24 CONTOUR (STENTS) IMPLANT
STENT URET 6FRX26 CONTOUR (STENTS) IMPLANT
SYRINGE TOOMEY IRRIG 70ML (MISCELLANEOUS) IMPLANT
TUBING CONNECTING 10 (TUBING) ×1 IMPLANT
TUBING UROLOGY SET (TUBING) ×1 IMPLANT

## 2024-11-10 NOTE — Op Note (Signed)
 Operative Note  Preoperative diagnosis:  1.  5 mm left UPJ stone 2.  History of gross hematuria  Postoperative diagnosis: 1.  5 mm left UPJ stone 2.  History of gross hematuria  Procedure(s): 1.  Cystoscopy with left ureteroscopy, holmium laser lithotripsy and left JJ stent placement 2.  Bilateral retrograde pyelograms with intraoperative interpretation of fluoroscopic imaging  Surgeon: Lonni Han, MD  Assistants:  None  Anesthesia:  General  Complications:  None  EBL: Less than 5 mL  Specimens: 1.  None  Drains/Catheters: 1.  Left 6 French, 24 cm JJ stent without tether  Intraoperative findings:   Left retrograde pyelogram revealed a solitary left collecting system with no filling defects or dilation involving the left ureter or left renal pelvis seen on retrograde pyelogram Right retrograde pyelogram revealed a solitary right collecting system with no filling defects or dilation involving the right ureter or right renal pelvis seen on retrograde pyelogram No intravesical or urethral abnormalities 5 mm left UPJ stone  Indication:  Jerry Knight is a 63 y.o. male with an obstructing 5 mm left UPJ stone.  He has been consented for the above procedures, voices understanding and wishes to proceed.  Description of procedure:  After informed consent was obtained, the patient was brought to the operating room and general LMA anesthesia was administered. The patient was then placed in the dorsolithotomy position and prepped and draped in the usual sterile fashion. A timeout was performed. A 21 French rigid cystoscope was then inserted into the urethral meatus and advanced into the bladder under direct vision. A complete bladder survey revealed no intravesical pathology.  A 5 French ureteral catheter was then inserted into the left ureteral orifice and a retrograde pyelogram was obtained, with the findings listed above.  A Glidewire was then used to intubate the lumen of  the ureteral catheter and was advanced up to the left renal pelvis, under fluoroscopic guidance.  The catheter was then removed, leaving the wire in place.  A flexible ureteroscope was then advanced up the left ureter alongside the wire and up to the left UPJ, where his stone was identified and quickly migrated into a midpole calyx.  A 200 m holmium laser was then used to fracture the stone into less than 1 mm fragments.  Flexible ureteroscope was then removed under direct vision, identifying no ureteral trauma or luminal stone burden.  A 6 French, 24 cm JJ stent was advanced over the Glidewire and into good position within the left collecting system, confirming placement via fluoroscopy.  A 5 French ureteral catheter was then inserted into the right ureteral orifice and a retrograde pyelogram was obtained, with the findings listed above.  The ureteral catheter was then removed.  The patient's bladder was drained.  She tolerated the procedure well and was transferred the postanesthesia in stable condition.  Plan: Follow-up in 1 week for office cystoscopy and stent removal

## 2024-11-10 NOTE — Transfer of Care (Signed)
 Immediate Anesthesia Transfer of Care Note  Patient: Jerry Knight  Procedure(s) Performed: CYSTOSCOPY/LEFT URETEROSCOPY/HOLMIUM LASER/STENT PLACEMENT/BILATERAL RETROGRADE (Left) CYSTOSCOPY, WITH BIOPSY (Left)  Patient Location: PACU  Anesthesia Type:General  Level of Consciousness: awake and alert   Airway & Oxygen Therapy: Patient Spontanous Breathing and Patient connected to nasal cannula oxygen  Post-op Assessment: Report given to RN and Post -op Vital signs reviewed and stable  Post vital signs: Reviewed and stable  Last Vitals:  Vitals Value Taken Time  BP 132/83 11/10/24 17:00  Temp 36.4 C 11/10/24 16:45  Pulse 85 11/10/24 17:10  Resp 17 11/10/24 17:10  SpO2 99 % 11/10/24 17:10  Vitals shown include unfiled device data.  Last Pain:  Vitals:   11/10/24 1658  TempSrc:   PainSc: 9       Patients Stated Pain Goal: 3 (11/10/24 1658)  Complications: No notable events documented.

## 2024-11-10 NOTE — H&P (Signed)
 Urology Preoperative H&P   Chief Complaint: kidney stones   History of Present Illness: Jerry Knight is a 63 y.o. male who was recently evaluated on 10/15/2024 due to recurrent episodes of gross hematuria. He had a CTSS (allergic to contrast) that time that revealed to 45 mm left UPJ/renal stones with no other acute GU abnormalities. He has 1 prior history of kidney stones and required ESWL 10+ years ago. He is a longtime 1 pack/day smoker. No personal/family history of GU malignancies. Significant past medical history of extensive bowel resection in 2024 due to colon perforation secondary to chronic constipation   Past Medical History:  Diagnosis Date   Arthritis    RA   Blood transfusion without reported diagnosis    Chest pain    Chronic neck pain    Chronic pain    Chronic pancreatitis (HCC)    COPD (chronic obstructive pulmonary disease) (HCC)    Degenerative disk disease    C 5 &C 6   Depression    GERD (gastroesophageal reflux disease)    GI bleed    Gout    History of kidney stones    Migraine    Migraine headache    Pain management contract agreement    Seizure disorder (HCC)    x 7 years   Seizures (HCC)    Substance abuse (HCC)    Has been cleaned for 18 mos   Zollinger-Ellison syndrome     Past Surgical History:  Procedure Laterality Date   APPENDECTOMY     per CT 2012- s/p appendectomy   CHOLECYSTECTOMY     EYE SURGERY     HERNIA REPAIR     IR RADIOLOGIST EVAL & MGMT  09/26/2022   IR SINUS/FIST TUBE CHK-NON GI  09/09/2022   LAPAROTOMY N/A 08/16/2022   Procedure: EXPLORATORY LAPAROTOMY;  Surgeon: Rubin Calamity, MD;  Location: [facility] OR;  Service: General;  Laterality: N/A;   OSTOMY Left 08/16/2022   Procedure: COLOSTOMY;  Surgeon: Rubin Calamity, MD;  Location: [facility] OR;  Service: General;  Laterality: Left;   PARTIAL COLECTOMY N/A 08/16/2022   Procedure: PARTIAL SIGMOID COLECTOMY;  Surgeon: Rubin Calamity, MD;  Location[facility] OR;  Service: General;   Laterality: N/A;   PARTIAL GASTRECTOMY     SMALL INTESTINE SURGERY      Allergies:  Allergies  Allergen Reactions   Ambien [Zolpidem Tartrate] Other (See Comments)    Black out   Iohexol      Pt claims he has chest tightness and sob after iv contrast injections.   Lunesta [Eszopiclone] Other (See Comments)   Seroquel [Quetiapine Fumarate]     Blackout   Sonata [Zaleplon]     black out'  confused    Family History  Problem Relation Age of Onset   Angina Mother    Nephrolithiasis Father    Cancer Brother     Social History:  reports that he has been smoking cigarettes. He has never used smokeless tobacco. He reports that he does not currently use drugs after having used the following drugs: Marijuana and Other-see comments. He reports that he does not drink alcohol .  ROS: A complete review of systems was performed.  All systems are negative except for pertinent findings as noted.  Physical Exam:  Vital signs in last 24 hours: Temp:  [97.8 F (36.6 C)] 97.8 F (36.6 C) (12/09 1250) Pulse Rate:  [80] 80 (12/09 1250) Resp:  [16] 16 (12/09 1250) BP: (111)/(76) 111/76 (12/09 1250) SpO2:  [98 %]  98 % (12/09 1250) Weight:  [52.2 kg] 52.2 kg (12/09 1250) Constitutional:  Alert and oriented, No acute distress Cardiovascular: Regular rate and rhythm, No JVD Respiratory: Normal respiratory effort, Lungs clear bilaterally GI: Abdomen is soft, nontender, nondistended, no abdominal masses GU: No CVA tenderness Lymphatic: No lymphadenopathy Neurologic: Grossly intact, no focal deficits Psychiatric: Normal mood and affect  Laboratory Data:  No results for input(s): WBC, HGB, HCT, PLT in the last 72 hours.  No results for input(s): NA, K, CL, GLUCOSE, BUN, CALCIUM , CREATININE in the last 72 hours.  Invalid input(s): CO3   No results found for this or any previous visit (from the past 24 hours). No results found for this or any previous visit (from the  past 240 hours).  Renal Function: No results for input(s): CREATININE in the last 168 hours. CrCl cannot be calculated (Patient's most recent lab result is older than the maximum 21 days allowed.).  Radiologic Imaging: No results found.  I independently reviewed the above imaging studies.  Assessment and Plan Jerry Knight is a 63 y.o. male with left ureteral and renal stones   The risks, benefits and alternatives of cystoscopy with LEFT ureteroscopy, laser lithotripsy and ureteral stent placement was discussed the patient.  Risks included, but are not limited to: bleeding, urinary tract infection, ureteral injury/avulsion, ureteral stricture formation, retained stone fragments, the possibility that multiple surgeries may be required to treat the stone(s), MI, stroke, PE and the inherent risks of general anesthesia.  The patient voices understanding and wishes to proceed.      Lonni Han, MD 11/10/2024, 7:50 AM  [facility] Pager: [phone]

## 2024-11-10 NOTE — Anesthesia Procedure Notes (Signed)
 Procedure Name: Intubation Date/Time: 11/10/2024 3:12 PM  Performed by: Dartha Meckel, CRNAPre-anesthesia Checklist: Patient identified, Emergency Drugs available, Suction available and Patient being monitored Patient Re-evaluated:Patient Re-evaluated prior to induction Oxygen Delivery Method: Circle system utilized Preoxygenation: Pre-oxygenation with 100% oxygen Induction Type: IV induction Ventilation: Mask ventilation without difficulty LMA: LMA inserted LMA Size: 3.0 Tube type: Oral Number of attempts: 1 Airway Equipment and Method: Oral airway Placement Confirmation: positive ETCO2 and breath sounds checked- equal and bilateral Tube secured with: Tape Dental Injury: Teeth and Oropharynx as per pre-operative assessment

## 2024-11-10 NOTE — Anesthesia Postprocedure Evaluation (Signed)
 Anesthesia Post Note  Patient: Jerry Knight  Procedure(s) Performed: CYSTOSCOPY/LEFT URETEROSCOPY/HOLMIUM LASER/STENT PLACEMENT/BILATERAL RETROGRADE (Left) CYSTOSCOPY, WITH BIOPSY (Left)     Patient location during evaluation: PACU Anesthesia Type: General Level of consciousness: awake and alert Pain management: pain level controlled Vital Signs Assessment: post-procedure vital signs reviewed and stable Respiratory status: spontaneous breathing, nonlabored ventilation, respiratory function stable and patient connected to nasal cannula oxygen Cardiovascular status: blood pressure returned to baseline and stable Postop Assessment: no apparent nausea or vomiting Anesthetic complications: no   No notable events documented.  Last Vitals:  Vitals:   11/10/24 1800 11/10/24 1821  BP: 139/84 (!) 144/82  Pulse: 79 80  Resp: (!) 9 14  Temp: 36.4 C   SpO2: 96% 97%    Last Pain:  Vitals:   11/10/24 1821  TempSrc:   PainSc: 3                  Epifanio Lamar BRAVO

## 2024-11-11 ENCOUNTER — Encounter (HOSPITAL_COMMUNITY): Payer: Self-pay | Admitting: Urology

## 2024-11-22 ENCOUNTER — Emergency Department (HOSPITAL_COMMUNITY)
Admission: EM | Admit: 2024-11-22 | Discharge: 2024-11-22 | Disposition: A | Discharge status: Home / Self Care | Attending: Emergency Medicine | Admitting: Emergency Medicine

## 2024-11-22 ENCOUNTER — Emergency Department (HOSPITAL_COMMUNITY)

## 2024-11-22 ENCOUNTER — Other Ambulatory Visit: Payer: Self-pay

## 2024-11-22 ENCOUNTER — Encounter (HOSPITAL_COMMUNITY): Payer: Self-pay

## 2024-11-22 DIAGNOSIS — R1084 Generalized abdominal pain: Secondary | ICD-10-CM | POA: Diagnosis present

## 2024-11-22 DIAGNOSIS — Z79899 Other long term (current) drug therapy: Secondary | ICD-10-CM | POA: Insufficient documentation

## 2024-11-22 DIAGNOSIS — K5900 Constipation, unspecified: Secondary | ICD-10-CM | POA: Diagnosis not present

## 2024-11-22 DIAGNOSIS — D72829 Elevated white blood cell count, unspecified: Secondary | ICD-10-CM | POA: Diagnosis not present

## 2024-11-22 DIAGNOSIS — E871 Hypo-osmolality and hyponatremia: Secondary | ICD-10-CM | POA: Diagnosis not present

## 2024-11-22 LAB — COMPREHENSIVE METABOLIC PANEL WITH GFR
ALT: 10 U/L (ref 0–44)
AST: 34 U/L (ref 15–41)
Albumin: 3.8 g/dL (ref 3.5–5.0)
Alkaline Phosphatase: 119 U/L (ref 38–126)
Anion gap: 10 (ref 5–15)
BUN: 11 mg/dL (ref 8–23)
CO2: 27 mmol/L (ref 22–32)
Calcium: 9.3 mg/dL (ref 8.9–10.3)
Chloride: 96 mmol/L — ABNORMAL LOW (ref 98–111)
Creatinine, Ser: 0.69 mg/dL (ref 0.61–1.24)
GFR, Estimated: >60 mL/min
Glucose, Bld: 108 mg/dL — ABNORMAL HIGH (ref 70–99)
Potassium: 3.8 mmol/L (ref 3.5–5.1)
Sodium: 133 mmol/L — ABNORMAL LOW (ref 135–145)
Total Bilirubin: 0.5 mg/dL (ref 0.0–1.2)
Total Protein: 7.9 g/dL (ref 6.5–8.1)

## 2024-11-22 LAB — CBC
HCT: 39.6 % (ref 39.0–52.0)
Hemoglobin: 13 g/dL (ref 13.0–17.0)
MCH: 29.1 pg (ref 26.0–34.0)
MCHC: 32.8 g/dL (ref 30.0–36.0)
MCV: 88.6 fL (ref 80.0–100.0)
Platelets: 246 K/uL (ref 150–400)
RBC: 4.47 MIL/uL (ref 4.22–5.81)
RDW: 13.5 % (ref 11.5–15.5)
WBC: 10.6 K/uL — ABNORMAL HIGH (ref 4.0–10.5)
nRBC: 0 % (ref 0.0–0.2)

## 2024-11-22 LAB — I-STAT CG4 LACTIC ACID, ED: Lactic Acid, Venous: 0.9 mmol/L (ref 0.5–1.9)

## 2024-11-22 LAB — LIPASE, BLOOD: Lipase: <10 U/L — ABNORMAL LOW (ref 11–51)

## 2024-11-22 MED ORDER — AMOXICILLIN-POT CLAVULANATE 875-125 MG PO TABS
1.0000 | ORAL_TABLET | Freq: Once | ORAL | Status: AC
  Administered 2024-11-22: 1 via ORAL
  Filled 2024-11-22: qty 1

## 2024-11-22 MED ORDER — FENTANYL CITRATE (PF) 50 MCG/ML IJ SOSY
50.0000 ug | PREFILLED_SYRINGE | INTRAMUSCULAR | Status: AC | PRN
  Administered 2024-11-22 (×2): 50 ug via INTRAVENOUS
  Filled 2024-11-22 (×2): qty 1

## 2024-11-22 MED ORDER — LACTATED RINGERS IV BOLUS
1000.0000 mL | Freq: Once | INTRAVENOUS | Status: AC
  Administered 2024-11-22: 1000 mL via INTRAVENOUS

## 2024-11-22 NOTE — ED Provider Notes (Signed)
 " [place] EMERGENCY DEPARTMENT AT [facility] Provider Note   CSN: [id number] Arrival date & time: 11/22/24  9644     Patient presents with: Constipation and Abdominal Pain   Jerry Knight is a 63 y.o. male.    Constipation Associated symptoms: abdominal pain   Abdominal Pain Associated symptoms: constipation    Patient is a 63 year old male with a past medical history significant for substance use disorder on Suboxone , Ativan  for anxiety, reflux, seizures, chronic pancreatitis, Zollinger-Ellison syndrome, chronic pain  Patient presents emergency room today with complaints of 3 days of harder ostomy output.  He states he is having to express stool through the ostomy but pushing.  He states he had a small amount of blood at the entrance of his ostomy recently.  No fevers or chills no chest pain or difficulty breathing.  He does indicate that he has some abdominal pain.  Denies any urinary frequency urgency dysuria or hematuria.      Prior to Admission medications  Medication Sig Start Date End Date Taking? Authorizing Provider  amitriptyline  (ELAVIL ) 50 MG tablet Take 50-100 mg by mouth at bedtime.    [provider]  atorvastatin  (LIPITOR) 20 MG tablet Take 1 tablet (20 mg total) by mouth daily. Patient taking differently: Take 20 mg by mouth daily with lunch. 02/19/21   Just, Kelsea J, FNP  buprenorphine -naloxone  (SUBOXONE ) 2-0.5 mg SUBL SL tablet Place 1 tablet under the tongue daily as needed (pain). 09/27/24   [provider]  cyclobenzaprine  (FLEXERIL ) 10 MG tablet Take 1 tablet (10 mg total) by mouth 3 (three) times daily. 02/19/21   Just, Kelsea J, FNP  etanercept (ENBREL) 50 MG/ML injection Inject 50 mg into the skin every Wednesday.    [provider]  LORazepam  (ATIVAN ) 1 MG tablet Take 1 mg by mouth 3 (three) times daily as needed for anxiety. 07/18/22   [provider]  Melatonin 10 MG CHEW Chew 20 mg by mouth at  bedtime.    [provider]  naproxen sodium (ALEVE) 220 MG tablet Take 220-440 mg by mouth 2 (two) times daily as needed (pain.).    [provider]  omeprazole  (PRILOSEC) 20 MG capsule TAKE 1 CAPSULE(20 MG) BY MOUTH DAILY Patient taking differently: Take 20 mg by mouth daily after breakfast. TAKE 1 CAPSULE(20 MG) BY MOUTH DAILY 02/19/21   Just, Kelsea J, FNP  oxybutynin  (DITROPAN ) 5 MG tablet Take 1 tablet (5 mg total) by mouth every 8 (eight) hours as needed for bladder spasms. 11/10/24   Devere Lonni Righter, MD  oxyCODONE -acetaminophen  (PERCOCET) 10-325 MG tablet Take 1 tablet by mouth every 6 (six) hours as needed for pain. 11/10/24 11/10/25  Devere Lonni Righter, MD  sulfaSALAzine (AZULFIDINE) 500 MG tablet Take 500 mg by mouth in the morning. 09/25/24   [provider]    Allergies: Ambien [zolpidem tartrate], Iohexol , Lunesta [eszopiclone], Seroquel [quetiapine fumarate], and Sonata [zaleplon]    Review of Systems  Gastrointestinal:  Positive for abdominal pain and constipation.    Updated Vital Signs BP 101/71 (BP Location: Left Arm)   Pulse 75   Temp 98 F (36.7 C)   Resp 17   SpO2 100%   Physical Exam Vitals and nursing note reviewed.  Constitutional:      General: He is not in acute distress. HENT:     Head: Normocephalic and atraumatic.     Nose: Nose normal.     Mouth/Throat:     Mouth: Mucous  membranes are dry.  Eyes:     General: No scleral icterus. Cardiovascular:     Rate and Rhythm: Normal rate and regular rhythm.     Pulses: Normal pulses.     Heart sounds: Normal heart sounds.  Pulmonary:     Effort: Pulmonary effort is normal. No respiratory distress.     Breath sounds: No wheezing.  Abdominal:     Palpations: Abdomen is soft.     Tenderness: There is abdominal tenderness.     Comments: Ostomy with empty bag attached  Very remote surgical scars across abdomen  Some generalized abdominal tenderness no guarding  or rebound.  Musculoskeletal:     Cervical back: Normal range of motion.     Right lower leg: No edema.     Left lower leg: No edema.  Skin:    General: Skin is warm and dry.     Capillary Refill: Capillary refill takes less than 2 seconds.  Neurological:     Mental Status: He is alert. Mental status is at baseline.  Psychiatric:        Mood and Affect: Mood normal.        Behavior: Behavior normal.     (all labs ordered are listed, but only abnormal results are displayed) Labs Reviewed  LIPASE, BLOOD - Abnormal; Notable for the following components:      Result Value   Lipase <10 (*)    All other components within normal limits  COMPREHENSIVE METABOLIC PANEL WITH GFR - Abnormal; Notable for the following components:   Sodium 133 (*)    Chloride 96 (*)    Glucose, Bld 108 (*)    All other components within normal limits  CBC - Abnormal; Notable for the following components:   WBC 10.6 (*)    All other components within normal limits  URINALYSIS, ROUTINE W REFLEX MICROSCOPIC  I-STAT CG4 LACTIC ACID, ED    EKG: None  Radiology: CT ABDOMEN PELVIS WO CONTRAST Result Date: 11/22/2024 EXAM: CT ABDOMEN AND PELVIS WITHOUT CONTRAST 11/22/2024 07:41:23 AM TECHNIQUE: CT of the abdomen and pelvis was performed without the administration of intravenous contrast. Multiplanar reformatted images are provided for review. Automated exposure control, iterative reconstruction, and/or weight-based adjustment of the mA/kV was utilized to reduce the radiation dose to as low as reasonably achievable. COMPARISON: CT of the abdomen and pelvis dated 10/15/2024. CLINICAL HISTORY: Abdominal pain, acute, nonlocalized; constipation hx w ostomy w little output. FINDINGS: LOWER CHEST: Numerous thin-walled cysts are again demonstrated within the lung bases bilaterally. There is mild atelectasis within the left lower lobe. A 9 mm ovoid nodule is again demonstrated laterally within the base of the right lower  lobe on image 39 of series 6. LIVER: The liver is unremarkable. GALLBLADDER AND BILE DUCTS: Gallbladder is unremarkable. No biliary ductal dilatation. SPLEEN: No acute abnormality. PANCREAS: No acute abnormality. ADRENAL GLANDS: No acute abnormality. KIDNEYS, URETERS AND BLADDER: There are dystrophic calcifications present laterally within the right kidney. No stones in the kidneys or ureters. No hydronephrosis. No perinephric or periureteral stranding. Urinary bladder is unremarkable. GI AND BOWEL: Stomach demonstrates no acute abnormality. 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 There is abnormal thickening of the wall of the colon extending through the ostomy site. The patient is status post appendectomy and sigmoid colectomy. There is scarring related to prior surgeries. There is no bowel obstruction. PERITONEUM AND RETROPERITONEUM: No ascites. No free air. VASCULATURE: The abdominal aorta demonstrates  moderate calcific atheromatous disease. LYMPH NODES: No lymphadenopathy. REPRODUCTIVE ORGANS: No acute abnormality. BONES AND SOFT TISSUES: No acute osseous abnormality. No focal soft tissue abnormality. IMPRESSION: 1. Abnormal thickening of the wall of the colon extending through the ostomy site. This is consistent with focal colitis and could be infectious, inflammatory or ischemic. 2. Stable 9 mm right lower lobe pulmonary nodule. Electronically signed by: Evalene Coho MD 11/22/2024 07:54 AM EST RP Workstation: [id number]     Procedures   Medications Ordered in the ED  lactated ringers  bolus 1,000 mL (0 mLs Intravenous Stopped 11/22/24 0944)  fentaNYL  (SUBLIMAZE ) injection 50 mcg (50 mcg Intravenous Given 11/22/24 0940)  amoxicillin -clavulanate (AUGMENTIN ) 875-125 MG per tablet 1 tablet (1 tablet Oral Given 11/22/24 0832)    Clinical Course as of 11/22/24 1119  Mon Nov 22, 2024  0724 Has had multiple pancreas surgeries.  Appy/chole/pancreas  [WF]    Clinical Course User Index [WF] Neldon Hamp RAMAN, [place]                                 Medical Decision Making Amount and/or Complexity of Data Reviewed Labs: ordered. Radiology: ordered.  Risk Prescription drug management.   This patient presents to the ED for concern of abd pain, this involves a number of treatment options, and is a complaint that carries with it a moderate risk of complications and morbidity. A differential diagnosis was considered for the patient's symptoms which is discussed below:   The causes of generalized abdominal pain include but are not limited to AAA, mesenteric ischemia, appendicitis, diverticulitis, DKA, gastritis, gastroenteritis, AMI, nephrolithiasis, pancreatitis, peritonitis, adrenal insufficiency,lead poisoning, iron toxicity, intestinal ischemia, constipation, UTI,SBO/LBO, splenic rupture, biliary disease, IBD, IBS, PUD, or hepatitis. Ectopic pregnancy, ovarian torsion, PID.    Co morbidities: Discussed in HPI   Brief History:  Patient is a 64 year old male with a past medical history significant for substance use disorder on Suboxone , Ativan  for anxiety, reflux, seizures, chronic pancreatitis, Zollinger-Ellison syndrome, chronic pain  Patient presents emergency room today with complaints of 3 days of harder ostomy output.  He states he is having to express stool through the ostomy but pushing.  He states he had a small amount of blood at the entrance of his ostomy recently.  No fevers or chills no chest pain or difficulty breathing.  He does indicate that he has some abdominal pain.  Denies any urinary frequency urgency dysuria or hematuria.    EMR reviewed including pt PMHx, past surgical history and past visits to ER.   See HPI for more details   Lab Tests:  I ordered and independently interpreted labs. Labs notable for  Lipase undetectable, CBC with mild leukocytosis.  CMP with mild hyponatremia likely dehydration.  Given  crystalloid.  Imaging Studies:  Abnormal findings. I personally reviewed all imaging studies. Imaging notable for  IMPRESSION:  1. Abnormal thickening of the wall of the colon extending through the ostomy  site. This is consistent with focal colitis and could be infectious,  inflammatory or ischemic.  2. Stable 9 mm right lower lobe pulmonary nodule.    Cardiac Monitoring:  The patient was maintained on a cardiac monitor.  I personally viewed and interpreted the cardiac monitored which showed an underlying rhythm of: NSR NA   Medicines ordered:  I ordered medication including 1 LR, fent x1 for pain augmentin  for abx Reevaluation of the patient after these medicines showed that the patient  improved I have reviewed the patients home medicines and have made adjustments as needed   Critical Interventions:     Consults/Attending Physician   Reevaluation:  After the interventions noted above I re-evaluated patient and found that they have :improved   Social Determinants of Health:      Problem List / ED Course:  Patient is a 63 year old male with a history of extensive abdominal surgeries and has an ostomy has had difficulty with stool output.  He is on chronic pain medications.    Dispostion:  After consideration of the diagnostic results and the patients response to treatment, I feel that the patent would benefit from outpatient follow up.    Final diagnoses:  Constipation, unspecified constipation type    ED Discharge Orders     None          Neldon Hamp RAMAN, [place] 11/22/24 1545    Ula Prentice SAUNDERS, MD 11/24/24 1634  "

## 2024-11-22 NOTE — ED Triage Notes (Signed)
 Pt BIB [facility] for constipation. Pt has an ostomy and has chronic constipation and at times he has to manually express stool  and he has had to for that the last 3 days, as well. Pt has taken miralax  and MOM and states taht tonight when he wiped his ostomy he noticed a small amount of blood on wipe. Pt also complains of generalized abdominal pain.

## 2024-11-22 NOTE — Discharge Instructions (Signed)
 You have some inflammation around your ostomy site which may be from attempted disimpactions versus mild infection. Take Augmentin  for the full course. Make sure you drink plenty of water .  I have discussed your case with our surgery team who agrees with my plan for MiraLAX .  Continue titrating up on the MiraLAX  until you are having soft stools.  GETTING TO GOOD BOWEL HEALTH. Irregular bowel habits such as constipation and diarrhea can lead to many problems over time.  Having one soft bowel movement a day is the most important way to prevent further problems.  The anorectal canal is designed to handle stretching and feces to safely manage our ability to get rid of solid waste (feces, poop, stool) out of our body.  BUT, hard constipated stools can act like ripping concrete bricks and diarrhea can be a burning fire to this very sensitive area of our body, causing inflamed hemorrhoids, anal fissures, increasing risk is perirectal abscesses, abdominal pain/bloating, an making irritable bowel worse.     The goal: ONE SOFT BOWEL MOVEMENT A DAY!  To have soft, regular bowel movements:  Drink at least 8 tall glasses of water  a day.   Take plenty of fiber.  Fiber is the undigested part of plant food that passes into the colon, acting s natures broom to encourage bowel motility and movement.  Fiber can absorb and hold large amounts of water . This results in a larger, bulkier stool, which is soft and easier to pass. Work gradually over several weeks up to 6 servings a day of fiber (25g a day even more if needed) in the form of: Vegetables -- Root (potatoes, carrots, turnips), leafy green (lettuce, salad greens, celery, spinach), or cooked high residue (cabbage, broccoli, etc) Fruit -- Fresh (unpeeled skin & pulp), Dried (prunes, apricots, cherries, etc ),  or stewed ( applesauce)  Whole grain breads, pasta, etc (whole wheat)  Bran cereals  Bulking Agents -- This type of water -retaining fiber generally is easily  obtained each day by one of the following:  Psyllium bran -- The psyllium plant is remarkable because its ground seeds can retain so much water . This product is available as Metamucil, Konsyl, Effersyllium, Per Diem Fiber, or the less expensive generic preparation in drug and health food stores. Although labeled a laxative, it really is not a laxative.  Methylcellulose -- This is another fiber derived from wood which also retains water . It is available as Citrucel. Polyethylene Glycol - and artificial fiber commonly called Miralax  or Glycolax .  It is helpful for people with gassy or bloated feelings with regular fiber Flax Seed - a less gassy fiber than psyllium No reading or other relaxing activity while on the toilet. If bowel movements take longer than 5 minutes, you are too constipated AVOID CONSTIPATION.  High fiber and water  intake usually takes care of this.  Sometimes a laxative is needed to stimulate more frequent bowel movements, but  Laxatives are not a good long-term solution as it can wear the colon out. Osmotics (Milk of Magnesia, Fleets phosphosoda, Magnesium  citrate, MiraLax , GoLytely ) are safer than  Stimulants (Senokot, Castor Oil, Dulcolax, Ex Lax)    Do not take laxatives for more than 7days in a row.  IF SEVERELY CONSTIPATED, try a Bowel Retraining Program: Do not use laxatives.  Eat a diet high in roughage, such as bran cereals and leafy vegetables.  Drink six (6) ounces of prune or apricot juice each morning.  Eat two (2) large servings of stewed fruit each day.  Take one (1) heaping tablespoon of a psyllium-based bulking agent twice a day. Use sugar-free sweetener when possible to avoid excessive calories.  Eat a normal breakfast.  Set aside 15 minutes after breakfast to sit on the toilet, but do not strain to have a bowel movement.  If you do not have a bowel movement by the third day, use an enema and repeat the above steps.  Controlling diarrhea Switch to liquids and  simpler foods for a few days to avoid stressing your intestines further. Avoid dairy products (especially milk & ice cream) for a short time.  The intestines often can lose the ability to digest lactose when stressed. Avoid foods that cause gassiness or bloating.  Typical foods include beans and other legumes, cabbage, broccoli, and dairy foods.  Every person has some sensitivity to other foods, so listen to our body and avoid those foods that trigger problems for you. Adding fiber (Citrucel, Metamucil, psyllium, Miralax ) gradually can help thicken stools by absorbing excess fluid and retrain the intestines to act more normally.  Slowly increase the dose over a few weeks.  Too much fiber too soon can backfire and cause cramping & bloating. Probiotics (such as active yogurt, Align, etc) may help repopulate the intestines and colon with normal bacteria and calm down a sensitive digestive tract.  Most studies show it to be of mild help, though, and such products can be costly. Medicines: Bismuth subsalicylate (ex. Kayopectate, Pepto Bismol) every 30 minutes for up to 6 doses can help control diarrhea.  Avoid if pregnant. Loperamide (Immodium) can slow down diarrhea.  Start with two tablets (4mg  total) first and then try one tablet every 6 hours.  Avoid if you are having fevers or severe pain.  If you are not better or start feeling worse, stop all medicines and call your doctor for advice Call your doctor if you are getting worse or not better.  Sometimes further testing (cultures, endoscopy, X-ray studies, bloodwork, etc) may be needed to help diagnose and treat the cause of the diarrhea.  Managing Pain  Pain after surgery or related to activity is often due to strain/injury to muscle, tendon, nerves and/or incisions.  This pain is usually short-term and will improve in a few months.   Many people find it helpful to do the following things TOGETHER to help speed the process of healing and to get back to  regular activity more quickly:  Avoid heavy physical activity  no lifting greater than 20 pounds Do not push through the pain.  Listen to your body and avoid positions and maneuvers than reproduce the pain Walking is okay as tolerated, but go slowly and stop when getting sore.  Remember: If it hurts to do it, then dont do it! Take Anti-inflammatory medication  Take with food/snack around the clock for 1-2 weeks This helps the muscle and nerve tissues become less irritable and calm down faster Choose ONE of the following over-the-counter medications: Naproxen 220mg  tabs (ex. Aleve) 1-2 pills twice a day  Ibuprofen 200mg  tabs (ex. Advil, Motrin) 3-4 pills with every meal and just before bedtime Acetaminophen  500mg  tabs (Tylenol ) 1-2 pills with every meal and just before bedtime Use a Heating pad or Ice/Cold Pack 4-6 times a day May use warm bath/hottub  or showers Try Gentle Massage and/or Stretching  at the area of pain many times a day stop if you feel pain - do not overdo it  Try these steps together to help you body heal faster  and avoid making things get worse.  Doing just one of these things may not be enough.    If you are not getting better after two weeks or are noticing you are getting worse, contact our office for further advice; we may need to re-evaluate you & see what other things we can do to help.
